# Patient Record
Sex: Female | Born: 1935 | Race: Black or African American | Hispanic: No | Marital: Married | Smoking: Never smoker
Health system: Southern US, Community
[De-identification: ages and names within clinical notes are randomized; demographics above are authoritative.]

## PROBLEM LIST (undated history)

## (undated) DIAGNOSIS — Z9289 Personal history of other medical treatment: Secondary | ICD-10-CM

## (undated) DIAGNOSIS — Z974 Presence of external hearing-aid: Secondary | ICD-10-CM

## (undated) DIAGNOSIS — E785 Hyperlipidemia, unspecified: Secondary | ICD-10-CM

## (undated) DIAGNOSIS — I10 Essential (primary) hypertension: Secondary | ICD-10-CM

## (undated) DIAGNOSIS — F32A Depression, unspecified: Secondary | ICD-10-CM

## (undated) DIAGNOSIS — G629 Polyneuropathy, unspecified: Secondary | ICD-10-CM

## (undated) DIAGNOSIS — D649 Anemia, unspecified: Secondary | ICD-10-CM

## (undated) DIAGNOSIS — Z87442 Personal history of urinary calculi: Secondary | ICD-10-CM

## (undated) DIAGNOSIS — R51 Headache: Secondary | ICD-10-CM

## (undated) DIAGNOSIS — K5900 Constipation, unspecified: Secondary | ICD-10-CM

## (undated) DIAGNOSIS — N289 Disorder of kidney and ureter, unspecified: Secondary | ICD-10-CM

## (undated) DIAGNOSIS — F329 Major depressive disorder, single episode, unspecified: Secondary | ICD-10-CM

## (undated) DIAGNOSIS — Z992 Dependence on renal dialysis: Secondary | ICD-10-CM

## (undated) DIAGNOSIS — I2510 Atherosclerotic heart disease of native coronary artery without angina pectoris: Secondary | ICD-10-CM

## (undated) DIAGNOSIS — M1990 Unspecified osteoarthritis, unspecified site: Secondary | ICD-10-CM

## (undated) DIAGNOSIS — R0602 Shortness of breath: Secondary | ICD-10-CM

## (undated) DIAGNOSIS — K219 Gastro-esophageal reflux disease without esophagitis: Secondary | ICD-10-CM

## (undated) DIAGNOSIS — T82898A Other specified complication of vascular prosthetic devices, implants and grafts, initial encounter: Secondary | ICD-10-CM

## (undated) DIAGNOSIS — J449 Chronic obstructive pulmonary disease, unspecified: Secondary | ICD-10-CM

## (undated) DIAGNOSIS — I209 Angina pectoris, unspecified: Secondary | ICD-10-CM

## (undated) DIAGNOSIS — J45909 Unspecified asthma, uncomplicated: Secondary | ICD-10-CM

## (undated) DIAGNOSIS — Z973 Presence of spectacles and contact lenses: Secondary | ICD-10-CM

## (undated) DIAGNOSIS — E213 Hyperparathyroidism, unspecified: Secondary | ICD-10-CM

## (undated) DIAGNOSIS — N189 Chronic kidney disease, unspecified: Secondary | ICD-10-CM

## (undated) DIAGNOSIS — J189 Pneumonia, unspecified organism: Secondary | ICD-10-CM

## (undated) HISTORY — PX: APPENDECTOMY: SHX54

## (undated) HISTORY — DX: Major depressive disorder, single episode, unspecified: F32.9

## (undated) HISTORY — DX: Chronic obstructive pulmonary disease, unspecified: J44.9

## (undated) HISTORY — DX: Anemia, unspecified: D64.9

## (undated) HISTORY — DX: Polyneuropathy, unspecified: G62.9

## (undated) HISTORY — DX: Essential (primary) hypertension: I10

## (undated) HISTORY — DX: Depression, unspecified: F32.A

## (undated) HISTORY — DX: Chronic kidney disease, unspecified: N18.9

## (undated) HISTORY — PX: EYE SURGERY: SHX253

## (undated) HISTORY — DX: Hyperlipidemia, unspecified: E78.5

## (undated) HISTORY — DX: Atherosclerotic heart disease of native coronary artery without angina pectoris: I25.10

---

## 1974-08-31 HISTORY — PX: ABDOMINAL HYSTERECTOMY: SHX81

## 2011-09-01 HISTORY — PX: KIDNEY STONE SURGERY: SHX686

## 2011-11-03 ENCOUNTER — Encounter: Payer: Self-pay | Admitting: Vascular Surgery

## 2011-11-05 ENCOUNTER — Ambulatory Visit: Payer: Medicare Other | Admitting: Vascular Surgery

## 2011-12-09 ENCOUNTER — Encounter: Payer: Self-pay | Admitting: Vascular Surgery

## 2011-12-10 ENCOUNTER — Ambulatory Visit (INDEPENDENT_AMBULATORY_CARE_PROVIDER_SITE_OTHER): Payer: Medicare Other | Admitting: Vascular Surgery

## 2011-12-10 ENCOUNTER — Encounter: Payer: Self-pay | Admitting: Vascular Surgery

## 2011-12-10 VITALS — BP 136/57 | HR 77 | Temp 98.3°F | Ht 63.0 in | Wt 156.7 lb

## 2011-12-10 DIAGNOSIS — Z992 Dependence on renal dialysis: Secondary | ICD-10-CM | POA: Insufficient documentation

## 2011-12-10 DIAGNOSIS — N189 Chronic kidney disease, unspecified: Secondary | ICD-10-CM

## 2011-12-10 DIAGNOSIS — N186 End stage renal disease: Secondary | ICD-10-CM

## 2011-12-10 DIAGNOSIS — Z01810 Encounter for preprocedural cardiovascular examination: Secondary | ICD-10-CM

## 2011-12-10 NOTE — Progress Notes (Signed)
[facility] [place] HISTORY AND PHYSICAL   History of Present Illness:  Patient is a 78 y.o. year old female who presents for placement of a permanent hemodialysis access. The patient is right handed .  The patient is not currently on hemodialysis.  The cause of renal failure is thought to be secondary to nephrolithiasis.  Other chronic medical problems include diabetes, hyperlipidemia, neuropathy, COPD, hypertension, anemia, coronary artery disease. These are all currently stable and followed by Dr. Feliciana Rossetti.  Her nephrologist is Dr. Elvis Coil.  Past Medical History  Diagnosis Date  . COPD (chronic obstructive pulmonary disease)     emphysema  . Neuropathy   . Hyperlipidemia   . Diabetes mellitus   . Chronic kidney disease   . Hypertension   . Depression   . Anemia   . CAD (coronary artery disease)   . Stroke     Past Surgical History  Procedure Date  . Abdominal hysterectomy 1976  . Kidney stone surgery 2013     Social History History  Substance Use Topics  . Smoking status: Never Smoker   . Smokeless tobacco: Not on file  . Alcohol Use: Not on file    Family History Family History  Problem Relation Age of Onset  . Diabetes Mother   . Cancer Father   . Deep vein thrombosis Brother   . Hypertension Brother     Allergies  No Known Allergies   Current Outpatient Prescriptions  Medication Sig Dispense Refill  . albuterol (PROVENTIL HFA;VENTOLIN HFA) 108 (90 BASE) MCG/ACT inhaler Inhale 2 puffs into the lungs every 6 (six) hours as needed.      Marland Kitchen aspirin EC 81 MG tablet Take 81 mg by mouth daily.      Marland Kitchen azelastine (ASTELIN) 137 MCG/SPRAY nasal spray Place 1 spray into the nose 2 (two) times daily. Use in each nostril as directed      . cloNIDine (CATAPRES) 0.2 MG tablet Take 0.2 mg by mouth 4 (four) times daily.      Marland Kitchen epoetin alfa (EPOGEN,PROCRIT) 16109 UNIT/ML injection Inject 10,000 Units into the skin.       Marland Kitchen esomeprazole (NEXIUM)  40 MG capsule Take 40 mg by mouth 2 (two) times daily.      . ferrous gluconate (FERGON) 325 MG tablet Take 325 mg by mouth 3 (three) times daily with meals.      . ferrous sulfate 325 (65 FE) MG tablet Take 325 mg by mouth 3 (three) times daily.      . furosemide (LASIX) 40 MG tablet Take 40 mg by mouth daily.      Marland Kitchen gabapentin (NEURONTIN) 300 MG capsule Take 300 mg by mouth 4 (four) times daily.      Marland Kitchen glipiZIDE (GLUCOTROL) 5 MG tablet Take 5 mg by mouth 2 (two) times daily before a meal.      . hydrALAZINE (APRESOLINE) 100 MG tablet Take 100 mg by mouth 2 (two) times daily.      . minoxidil (LONITEN) 2.5 MG tablet Take 2.5 mg by mouth daily.      . polyethylene glycol (MIRALAX / GLYCOLAX) packet Take 17 g by mouth daily.      . simvastatin (ZOCOR) 10 MG tablet Take 5 mg by mouth at bedtime.       . sitaGLIPtin (JANUVIA) 50 MG tablet Take 25 mg by mouth daily.       . vitamin C (ASCORBIC ACID) 500 MG tablet Take 500 mg  by mouth 3 (three) times daily.      . hydrochlorothiazide (HYDRODIURIL) 25 MG tablet Take 25 mg by mouth daily.      . ISOSORBIDE PO Take 160 mg by mouth daily.      Marland Kitchen NIFEdipine (ADALAT CC) 90 MG 24 hr tablet Take 90 mg by mouth daily.      . nitroGLYCERIN (NITROSTAT) 0.4 MG SL tablet Place 0.4 mg under the tongue every 5 (five) minutes as needed.      . pioglitazone (ACTOS) 15 MG tablet Take 15 mg by mouth daily.      Marland Kitchen tiZANidine (ZANAFLEX) 4 MG capsule Take 4 mg by mouth as needed.      . traMADol (ULTRAM) 50 MG tablet Take 50 mg by mouth every 6 (six) hours as needed.      . Wheat Dextrin (BENEFIBER PO) Take by mouth.        ROS:   General:  No weight loss, Fever, chills  HEENT: No recent headaches, no nasal bleeding, no visual changes, no sore throat, she is hard of hearing  Neurologic: No dizziness, blackouts, seizures. No recent symptoms of stroke or mini- stroke. No recent episodes of slurred speech, or temporary blindness.  Cardiac: No recent episodes of  chest pain/pressure, no shortness of breath at rest.  + shortness of breath with exertion.  Denies history of atrial fibrillation or irregular heartbeat  Vascular: No history of rest pain in feet.  No history of claudication.  No history of non-healing ulcer, No history of DVT   Pulmonary: No home oxygen, no productive cough, no hemoptysis  Musculoskeletal:  [x ] Arthritis, [ ]  Low back pain,  [ ]  Joint pain  Hematologic:No history of hypercoagulable state.  No history of easy bleeding.  No history of anemia  Gastrointestinal: No hematochezia or melena,  No gastroesophageal reflux, no trouble swallowing  Urinary: [x ] chronic Kidney disease, [ ]  on HD - [ ]  MWF or [ ]  TTHS, [ ]  Burning with urination, [ ]  Frequent urination, [ ]  Difficulty urinating;   Skin: No rashes  Psychological: No history of anxiety,  No history of depression   Physical Examination  Filed Vitals:   12/10/11 1621  BP: 136/57  Pulse: 77  Temp: 98.3 F (36.8 C)  TempSrc: Oral  Height: 5\' 3"  (1.6 m)  Weight: 156 lb 11.2 oz (71.079 kg)  SpO2: 100%    Body mass index is 27.76 kg/(m^2).  General:  Alert and oriented, no acute distress HEENT: Normal Neck: No bruit or JVD Pulmonary: Clear to auscultation bilaterally Cardiac: Regular Rate and Rhythm without murmur Gastrointestinal: Soft, non-tender, non-distended, no mass, no scars Skin: No rash Extremity Pulses:  2+ radial, brachial pulses bilaterally, on placement of a tourniquet on the left forearm the cephalic vein is not easily visible Musculoskeletal: No deformity or edema  Neurologic: Upper and lower extremity motor 5/5 and symmetric  DATA: She had a vein mapping ultrasound today which I reviewed and interpreted. This shows the left cephalic vein is greater than 3 mm in the forearm but smaller in the upper arm the left basilic vein is 4-6 mm in the upper arm the right basilic vein is 46 mm in the upper arm the right cephalic vein is not  visualized   ASSESSMENT: Patient needs long-term hemodialysis access. She has a reasonable vein in the left forearm for possible radiocephalic fistula. However on physical exam the vein was fairly marginal.  I believe the best option  would be to attempt placing a left radiocephalic AV fistula. But at the time of the operation the vein is of poor quality we would do a first stage basilic vein transposition fistula. I discussed with the patient and her daughter the operative plan as well as the fact that if we do a basilic vein transposition it would be a two-stage procedure. Also discussed with them the risks benefits and possible complications of the procedure including but limited to bleeding infection non-maturation of the fistula ischemic steal to understand agree to proceed.   PLAN:  Left radiocephalic fistula versus basilic vein transposition on 12/22/2011  Fabienne Bruns, MD [facility] [place] Office: [phone] Pager: [phone]

## 2011-12-16 ENCOUNTER — Encounter (HOSPITAL_COMMUNITY): Payer: Self-pay | Admitting: Pharmacy Technician

## 2011-12-18 ENCOUNTER — Other Ambulatory Visit: Payer: Self-pay

## 2011-12-18 NOTE — Procedures (Unsigned)
CEPHALIC VEIN MAPPING  INDICATION:  Chronic kidney disease unspecified.  HISTORY: CAD, chronic kidney disease, non-smoker.  EXAM:  The right cephalic vein is compressible.  Diameter measurements range from 0.09 to 0.25 cm.  The right basilic vein is compressible.  Diameter measurements range from 0.42 to 0.62 cm.  The left cephalic vein is compressible.  Diameter measurements range from 0.21 to 0.38 cm.  The left basilic vein is compressible.  Diameter measurements range from 0.36 to 0.50.  See attached worksheet for all measurements.  IMPRESSION: 1. Patent bilateral cephalic and basilic veins with diameter     measurements as described above. 2. Technically difficult and limited visualization of the right     cephalic vein due to small caliber in the upper arm, unable to     follow to the subclavian confluence which may be suggestive of     occlusion versus small caliber.   ___________________________________________ Annette Hora Fields, MD  SH/MEDQ  D:  12/10/2011  T:  12/10/2011  Job:  [id number]

## 2011-12-21 NOTE — Pre-Procedure Instructions (Signed)
20 Annette Roach  12/21/2011   Your procedure is scheduled on:  December 28, 2011 at 1059 am.  Report to [facility] at 0730 AM.  Call this number if you have problems the morning of surgery: [phone]   Remember:   Do not eat food:After Midnight.  May have clear liquids: up to 4 Hours before arrival until 0330 am.  Clear liquids include soda, tea, black coffee, apple or grape juice, broth.  Take these medicines the morning of surgery with A SIP OF WATER: Albuterol Inhaler, Clonidine (Catapres) , Esomeprazole (Nexium), Gabapentin (Neurontin), Hydralazine( Apresoline), and Tramadol (Ultram) if needed for pain.    Do not wear jewelry, make-up or nail polish.  Do not wear lotions, powders, or perfumes. You may wear deodorant.  Do not shave 48 hours prior to surgery.  Do not bring valuables to the hospital.  Contacts, dentures or bridgework may not be worn into surgery.  Leave suitcase in the car. After surgery it may be brought to your room.  For patients admitted to the hospital, checkout time is 11:00 AM the day of discharge.   Patients discharged the day of surgery will not be allowed to drive home.  Name and phone number of your driver:   Special Instructions: CHG Shower Use Special Wash: 1/2 bottle night before surgery and 1/2 bottle morning of surgery.   Please read over the following fact sheets that you were given: Pain Booklet, Coughing and Deep Breathing, MRSA Information and Surgical Site Infection Prevention

## 2011-12-22 ENCOUNTER — Telehealth: Payer: Self-pay | Admitting: *Deleted

## 2011-12-22 ENCOUNTER — Inpatient Hospital Stay (HOSPITAL_COMMUNITY): Admission: RE | Admit: 2011-12-22 | Discharge: 2011-12-22 | Payer: Medicare Other | Source: Ambulatory Visit

## 2011-12-22 NOTE — Telephone Encounter (Signed)
Cancel surgery on 12/28/11 because Annette Roach undecided and wants to wait. She said they would call back to reschedule

## 2011-12-23 ENCOUNTER — Other Ambulatory Visit (HOSPITAL_COMMUNITY): Payer: Medicare Other

## 2011-12-28 ENCOUNTER — Encounter (HOSPITAL_COMMUNITY): Admission: RE | Payer: Self-pay | Source: Ambulatory Visit

## 2011-12-28 ENCOUNTER — Ambulatory Visit (HOSPITAL_COMMUNITY): Admission: RE | Admit: 2011-12-28 | Payer: Medicare Other | Source: Ambulatory Visit | Admitting: Vascular Surgery

## 2011-12-28 SURGERY — ARTERIOVENOUS (AV) FISTULA CREATION
Anesthesia: General | Site: Arm Lower | Laterality: Left

## 2012-01-07 ENCOUNTER — Other Ambulatory Visit: Payer: Self-pay | Admitting: *Deleted

## 2012-01-08 ENCOUNTER — Encounter (HOSPITAL_COMMUNITY): Payer: Self-pay | Admitting: Pharmacy Technician

## 2012-01-19 ENCOUNTER — Encounter (HOSPITAL_COMMUNITY): Payer: Self-pay | Admitting: *Deleted

## 2012-01-19 MED ORDER — CEFAZOLIN SODIUM 1-5 GM-% IV SOLN
1.0000 g | Freq: Once | INTRAVENOUS | Status: AC
  Administered 2012-01-20: 1 g via INTRAVENOUS
  Filled 2012-01-19: qty 50

## 2012-01-20 ENCOUNTER — Encounter (HOSPITAL_COMMUNITY): Payer: Self-pay | Admitting: Surgery

## 2012-01-20 ENCOUNTER — Encounter (HOSPITAL_COMMUNITY): Payer: Self-pay | Admitting: Certified Registered"

## 2012-01-20 ENCOUNTER — Encounter (HOSPITAL_COMMUNITY)
Admission: RE | Disposition: A | Discharge status: Home / Self Care | Payer: Self-pay | Source: Ambulatory Visit | Attending: Vascular Surgery

## 2012-01-20 ENCOUNTER — Ambulatory Visit (HOSPITAL_COMMUNITY)
Admission: RE | Admit: 2012-01-20 | Discharge: 2012-01-20 | Disposition: A | Discharge status: Home / Self Care | Payer: Medicare Other | Source: Ambulatory Visit | Attending: Vascular Surgery | Admitting: Vascular Surgery

## 2012-01-20 ENCOUNTER — Ambulatory Visit (HOSPITAL_COMMUNITY): Payer: Medicare Other | Admitting: Certified Registered"

## 2012-01-20 ENCOUNTER — Telehealth: Payer: Self-pay | Admitting: Vascular Surgery

## 2012-01-20 ENCOUNTER — Ambulatory Visit (HOSPITAL_COMMUNITY): Payer: Medicare Other

## 2012-01-20 DIAGNOSIS — F3289 Other specified depressive episodes: Secondary | ICD-10-CM | POA: Insufficient documentation

## 2012-01-20 DIAGNOSIS — E119 Type 2 diabetes mellitus without complications: Secondary | ICD-10-CM | POA: Insufficient documentation

## 2012-01-20 DIAGNOSIS — F411 Generalized anxiety disorder: Secondary | ICD-10-CM | POA: Insufficient documentation

## 2012-01-20 DIAGNOSIS — N186 End stage renal disease: Secondary | ICD-10-CM | POA: Insufficient documentation

## 2012-01-20 DIAGNOSIS — I2510 Atherosclerotic heart disease of native coronary artery without angina pectoris: Secondary | ICD-10-CM | POA: Insufficient documentation

## 2012-01-20 DIAGNOSIS — I120 Hypertensive chronic kidney disease with stage 5 chronic kidney disease or end stage renal disease: Secondary | ICD-10-CM | POA: Insufficient documentation

## 2012-01-20 DIAGNOSIS — K219 Gastro-esophageal reflux disease without esophagitis: Secondary | ICD-10-CM | POA: Insufficient documentation

## 2012-01-20 DIAGNOSIS — F329 Major depressive disorder, single episode, unspecified: Secondary | ICD-10-CM | POA: Insufficient documentation

## 2012-01-20 HISTORY — DX: Shortness of breath: R06.02

## 2012-01-20 HISTORY — PX: AV FISTULA PLACEMENT: SHX1204

## 2012-01-20 HISTORY — DX: Gastro-esophageal reflux disease without esophagitis: K21.9

## 2012-01-20 LAB — SURGICAL PCR SCREEN: MRSA, PCR: NEGATIVE

## 2012-01-20 LAB — POCT I-STAT 4, (NA,K, GLUC, HGB,HCT)
Hemoglobin: 11.9 g/dL — ABNORMAL LOW (ref 12.0–15.0)
Potassium: 5.1 mEq/L (ref 3.5–5.1)

## 2012-01-20 SURGERY — ARTERIOVENOUS (AV) FISTULA CREATION
Anesthesia: General | Site: Arm Upper | Laterality: Left | Wound class: Clean

## 2012-01-20 MED ORDER — FENTANYL CITRATE 0.05 MG/ML IJ SOLN
INTRAMUSCULAR | Status: DC | PRN
  Administered 2012-01-20 (×3): 25 ug via INTRAVENOUS

## 2012-01-20 MED ORDER — MUPIROCIN 2 % EX OINT
TOPICAL_OINTMENT | CUTANEOUS | Status: AC
  Administered 2012-01-20: 1 via NASAL
  Filled 2012-01-20: qty 22

## 2012-01-20 MED ORDER — SODIUM CHLORIDE 0.9 % IV SOLN
INTRAVENOUS | Status: DC | PRN
  Administered 2012-01-20: 08:00:00 via INTRAVENOUS

## 2012-01-20 MED ORDER — ONDANSETRON HCL 4 MG/2ML IJ SOLN
INTRAMUSCULAR | Status: DC | PRN
  Administered 2012-01-20: 4 mg via INTRAVENOUS

## 2012-01-20 MED ORDER — LIDOCAINE HCL (CARDIAC) 20 MG/ML IV SOLN
INTRAVENOUS | Status: DC | PRN
  Administered 2012-01-20: 60 mg via INTRAVENOUS

## 2012-01-20 MED ORDER — OXYCODONE HCL 5 MG PO TABS: 5.0000 mg | ORAL_TABLET | ORAL | Status: AC | PRN

## 2012-01-20 MED ORDER — HYDROMORPHONE HCL PF 1 MG/ML IJ SOLN
0.2500 mg | INTRAMUSCULAR | Status: DC | PRN
  Administered 2012-01-20: 0.5 mg via INTRAVENOUS

## 2012-01-20 MED ORDER — PROPOFOL 10 MG/ML IV EMUL
INTRAVENOUS | Status: DC | PRN
  Administered 2012-01-20: 100 mg via INTRAVENOUS

## 2012-01-20 MED ORDER — DROPERIDOL 2.5 MG/ML IJ SOLN: 0.6250 mg | INTRAMUSCULAR | Status: DC | PRN

## 2012-01-20 MED ORDER — MUPIROCIN 2 % EX OINT
TOPICAL_OINTMENT | Freq: Two times a day (BID) | CUTANEOUS | Status: DC
  Filled 2012-01-20: qty 22

## 2012-01-20 MED ORDER — SODIUM CHLORIDE 0.9 % IV SOLN: INTRAVENOUS | Status: DC

## 2012-01-20 MED ORDER — 0.9 % SODIUM CHLORIDE (POUR BTL) OPTIME
TOPICAL | Status: DC | PRN
  Administered 2012-01-20: 1000 mL

## 2012-01-20 MED ORDER — IPRATROPIUM-ALBUTEROL 18-103 MCG/ACT IN AERO
INHALATION_SPRAY | RESPIRATORY_TRACT | Status: DC | PRN
  Administered 2012-01-20: 2 via RESPIRATORY_TRACT

## 2012-01-20 MED ORDER — HEPARIN SODIUM (PORCINE) 1000 UNIT/ML IJ SOLN
INTRAMUSCULAR | Status: DC | PRN
  Administered 2012-01-20: 5000 [IU] via INTRAVENOUS

## 2012-01-20 MED ORDER — ESMOLOL HCL 10 MG/ML IV SOLN
INTRAVENOUS | Status: DC | PRN
  Administered 2012-01-20: 5 mg via INTRAVENOUS

## 2012-01-20 MED ORDER — SODIUM CHLORIDE 0.9 % IR SOLN
Status: DC | PRN
  Administered 2012-01-20: 09:00:00

## 2012-01-20 SURGICAL SUPPLY — 45 items
CANISTER SUCTION 2500CC (MISCELLANEOUS) ×3 IMPLANT
CLIP TI MEDIUM 6 (CLIP) ×3 IMPLANT
CLIP TI WIDE RED SMALL 6 (CLIP) ×6 IMPLANT
CLOTH BEACON ORANGE TIMEOUT ST (SAFETY) ×3 IMPLANT
COVER PROBE W GEL 5X96 (DRAPES) IMPLANT
COVER SURGICAL LIGHT HANDLE (MISCELLANEOUS) ×6 IMPLANT
DECANTER SPIKE VIAL GLASS SM (MISCELLANEOUS) ×3 IMPLANT
DERMABOND ADVANCED (GAUZE/BANDAGES/DRESSINGS) ×1
DERMABOND ADVANCED .7 DNX12 (GAUZE/BANDAGES/DRESSINGS) ×2 IMPLANT
DRAIN PENROSE 1/4X12 LTX STRL (WOUND CARE) ×3 IMPLANT
ELECT REM PT RETURN 9FT ADLT (ELECTROSURGICAL) ×3
ELECTRODE REM PT RTRN 9FT ADLT (ELECTROSURGICAL) ×2 IMPLANT
GAUZE SPONGE 2X2 8PLY STRL LF (GAUZE/BANDAGES/DRESSINGS) ×2 IMPLANT
GEL ULTRASOUND 20GR AQUASONIC (MISCELLANEOUS) IMPLANT
GLOVE BIO SURGEON STRL SZ7.5 (GLOVE) ×3 IMPLANT
GLOVE BIO SURGEON STRL SZ8 (GLOVE) ×3 IMPLANT
GLOVE BIOGEL PI IND STRL 6.5 (GLOVE) ×2 IMPLANT
GLOVE BIOGEL PI IND STRL 7.0 (GLOVE) ×2 IMPLANT
GLOVE BIOGEL PI IND STRL 7.5 (GLOVE) ×2 IMPLANT
GLOVE BIOGEL PI INDICATOR 6.5 (GLOVE) ×1
GLOVE BIOGEL PI INDICATOR 7.0 (GLOVE) ×1
GLOVE BIOGEL PI INDICATOR 7.5 (GLOVE) ×1
GLOVE SS BIOGEL STRL SZ 7 (GLOVE) ×2 IMPLANT
GLOVE SUPERSENSE BIOGEL SZ 7 (GLOVE) ×1
GLOVE SURG SS PI 7.5 STRL IVOR (GLOVE) ×3 IMPLANT
GOWN PREVENTION PLUS XLARGE (GOWN DISPOSABLE) IMPLANT
GOWN STRL NON-REIN LRG LVL3 (GOWN DISPOSABLE) IMPLANT
KIT BASIN OR (CUSTOM PROCEDURE TRAY) ×3 IMPLANT
KIT ROOM TURNOVER OR (KITS) ×3 IMPLANT
LOOP VESSEL MINI RED (MISCELLANEOUS) IMPLANT
NS IRRIG 1000ML POUR BTL (IV SOLUTION) ×3 IMPLANT
PACK CV ACCESS (CUSTOM PROCEDURE TRAY) ×3 IMPLANT
PAD ARMBOARD 7.5X6 YLW CONV (MISCELLANEOUS) ×6 IMPLANT
SPONGE GAUZE 2X2 STER 10/PKG (GAUZE/BANDAGES/DRESSINGS) ×1
SPONGE SURGIFOAM ABS GEL 100 (HEMOSTASIS) IMPLANT
SUT PROLENE 6 0 CC (SUTURE) ×3 IMPLANT
SUT PROLENE 7 0 BV 1 (SUTURE) ×3 IMPLANT
SUT VIC AB 3-0 SH 27 (SUTURE) ×1
SUT VIC AB 3-0 SH 27X BRD (SUTURE) ×2 IMPLANT
SUT VICRYL 4-0 PS2 18IN ABS (SUTURE) ×3 IMPLANT
TAPE CLOTH SURG 4X10 WHT LF (GAUZE/BANDAGES/DRESSINGS) ×3 IMPLANT
TOWEL OR 17X24 6PK STRL BLUE (TOWEL DISPOSABLE) ×3 IMPLANT
TOWEL OR 17X26 10 PK STRL BLUE (TOWEL DISPOSABLE) ×3 IMPLANT
UNDERPAD 30X30 INCONTINENT (UNDERPADS AND DIAPERS) ×3 IMPLANT
WATER STERILE IRR 1000ML POUR (IV SOLUTION) ×3 IMPLANT

## 2012-01-20 NOTE — Progress Notes (Signed)
Dr. Krista Blue notified of patient EKG and he stated he will evaluate pt in the holding area.

## 2012-01-20 NOTE — Transfer of Care (Signed)
Immediate Anesthesia Transfer of Care Note  Patient: Annette Roach  Procedure(s) Performed: Procedure(s) (LRB): ARTERIOVENOUS (AV) FISTULA CREATION (Left)  Patient Location: PACU  Anesthesia Type: General  Level of Consciousness: sedated  Airway & Oxygen Therapy: Patient Spontanous Breathing  Post-op Assessment: Report given to PACU RN  Post vital signs: stable  Complications: No apparent anesthesia complications

## 2012-01-20 NOTE — Op Note (Signed)
Procedure: Left Radial Cephalic AV fistula   Preop: ESRD   Postop: ESRD   Anesthesia: General  Assistant:Maureen Collins, PA-c   Findings: 2.5 mm cephalic vein   2.5 mm radial artery   Procedure Details:  The left upper extremity was prepped and draped in usual sterile fashion. A longitudinal skin incision was made midway between the cephalic vein and radial artery at the distal left forearm. The incision was carried into the subcutaneous tissues down to level cephalic vein. The vein had some spasm but was overall reasonable quality 2.95mm diameter. The vein was dissected free circumferentially and small side branches ligated and divided between silk ties. This was gently distended with heparinized saline, spatulated, and marked for orientation. Next the radial artery was dissected free in the medial portion incision. The artery was 2.5 mm in diameter but had a reasonable pulse. The vessel loops were placed proximal and distal to the planned site of arteriotomy. The patient was given 5000 units of intravenous heparin. After appropriate circulation time, the vessel loops were used to control the artery. A longitudinal opening was made in the left radial artery. The vein was controlled proximally with a fine bulldog clamp. The vein was then swung over to the artery and sewn end of vein to side of artery using a running 7-0 Prolene suture. Just prior to completion, the anastomosis was fore bled back bled and thoroughly flushed. The anastomosis was secured, vessel loops released, and there was a palpable thrill in the fistula immediately. After hemostasis was obtained, the subcutaneous tissues were reapproximated using a running 3-0 Vicryl suture. The skin was then closed with a 4 Vicryl subcuticular stitch. Dermabond was applied to the skin incision. The patient tolerated the procedure well and there were no complications. Instrument sponge and needle count were correct at the end of the case. The patient  was taken to PACU in stable condition.   Fabienne Bruns, MD  [facility] [place]  Office: [phone]  Pager: [phone]

## 2012-01-20 NOTE — Anesthesia Postprocedure Evaluation (Signed)
Anesthesia Post Note  Patient: Annette Roach  Procedure(s) Performed: Procedure(s) (LRB): ARTERIOVENOUS (AV) FISTULA CREATION (Left)  Anesthesia type: general  Patient location: PACU  Post pain: Pain level controlled  Post assessment: Patient's Cardiovascular Status Stable  Last Vitals:  Filed Vitals:   01/20/12 1140  BP: 139/66  Pulse: 79  Temp: 36.4 C  Resp: 14    Post vital signs: Reviewed and stable  Level of consciousness: sedated  Complications: No apparent anesthesia complications

## 2012-01-20 NOTE — Telephone Encounter (Signed)
Message copied by Fredrich Birks on Wed Jan 20, 2012 11:55 AM ------      Message from: Fabienne Bruns E      Created: Wed Jan 20, 2012  9:57 AM       Left radial cephalic AVF      Collins asst      Needs follow up one month            Leonette Most

## 2012-01-20 NOTE — Anesthesia Preprocedure Evaluation (Addendum)
Anesthesia Evaluation  Patient identified by MRN, date of birth, ID band Patient awake    Reviewed: Allergy & Precautions, H&P , NPO status , Patient's Chart, lab work & pertinent test results, reviewed documented beta blocker date and time   History of Anesthesia Complications Negative for: history of anesthetic complications  Airway Mallampati: I TM Distance: <3 FB Neck ROM: Full    Dental  (+) Loose and Dental Advisory Given,    Pulmonary shortness of breath and with exertion, COPD COPD inhaler,  breath sounds clear to auscultation        Cardiovascular hypertension, Pt. on medications + CAD Rhythm:Regular Rate:Normal     Neuro/Psych Anxiety Depression  Neuromuscular disease    GI/Hepatic Neg liver ROS, GERD-  Medicated,  Endo/Other  Diabetes mellitus-, Well Controlled, Type 2, Oral Hypoglycemic Agents  Renal/GU Renal disease     Musculoskeletal   Abdominal (+)  Abdomen: soft.    Peds  Hematology   Anesthesia Other Findings   Reproductive/Obstetrics                       Anesthesia Physical Anesthesia Plan  ASA: III  Anesthesia Plan: General   Post-op Pain Management:    Induction: Intravenous  Airway Management Planned: LMA  Additional Equipment:   Intra-op Plan:   Post-operative Plan: Extubation in OR  Informed Consent: I have reviewed the patients History and Physical, chart, labs and discussed the procedure including the risks, benefits and alternatives for the proposed anesthesia with the patient or authorized representative who has indicated his/her understanding and acceptance.   Dental advisory given  Plan Discussed with: CRNA, Anesthesiologist and Surgeon  Anesthesia Plan Comments:         Anesthesia Quick Evaluation

## 2012-01-20 NOTE — Telephone Encounter (Signed)
Spoke with pts daughter to notify of follow up from surgery on 02/25/12 @ 9:45am with [facility], sent letter also, dpm

## 2012-01-20 NOTE — Preoperative (Signed)
Beta Blockers   Reason not to administer Beta Blockers:Not Applicable 

## 2012-01-20 NOTE — H&P (Signed)
[facility]   HISTORY AND PHYSICAL  History of Present Illness: Patient is a 78 y.o. year old female who presents for placement of a permanent hemodialysis access. The patient is right handed  . The patient is not currently on hemodialysis. The cause of renal failure is thought to be secondary to nephrolithiasis. Other chronic medical problems include diabetes, hyperlipidemia, neuropathy, COPD, hypertension, anemia, coronary artery disease. These are all currently stable and followed by Dr. Feliciana Rossetti. Her nephrologist is Dr. Elvis Coil.  Past Medical History   Diagnosis  Date   .  COPD (chronic obstructive pulmonary disease)      emphysema   .  Neuropathy    .  Hyperlipidemia    .  Diabetes mellitus    .  Chronic kidney disease    .  Hypertension    .  Depression    .  Anemia    .  CAD (coronary artery disease)    .  Stroke     Past Surgical History   Procedure  Date   .  Abdominal hysterectomy  1976   .  Kidney stone surgery  2013   Social History  History   Substance Use Topics   .  Smoking status:  Never Smoker   .  Smokeless tobacco:  Not on file   .  Alcohol Use:  Not on file   Family History  Family History   Problem  Relation  Age of Onset   .  Diabetes  Mother    .  Cancer  Father    .  Deep vein thrombosis  Brother    .  Hypertension  Brother    Allergies  No Known Allergies  Current Outpatient Prescriptions   Medication  Sig  Dispense  Refill   .  albuterol (PROVENTIL HFA;VENTOLIN HFA) 108 (90 BASE) MCG/ACT inhaler  Inhale 2 puffs into the lungs every 6 (six) hours as needed.     Marland Kitchen  aspirin EC 81 MG tablet  Take 81 mg by mouth daily.     Marland Kitchen  azelastine (ASTELIN) 137 MCG/SPRAY nasal spray  Place 1 spray into the nose 2 (two) times daily. Use in each nostril as directed     .  cloNIDine (CATAPRES) 0.2 MG tablet  Take 0.2 mg by mouth 4 (four) times daily.     Marland Kitchen  epoetin alfa (EPOGEN,PROCRIT) 38756 UNIT/ML injection  Inject 10,000 Units  into the skin.     Marland Kitchen  esomeprazole (NEXIUM) 40 MG capsule  Take 40 mg by mouth 2 (two) times daily.     .  ferrous gluconate (FERGON) 325 MG tablet  Take 325 mg by mouth 3 (three) times daily with meals.     .  ferrous sulfate 325 (65 FE) MG tablet  Take 325 mg by mouth 3 (three) times daily.     .  furosemide (LASIX) 40 MG tablet  Take 40 mg by mouth daily.     Marland Kitchen  gabapentin (NEURONTIN) 300 MG capsule  Take 300 mg by mouth 4 (four) times daily.     Marland Kitchen  glipiZIDE (GLUCOTROL) 5 MG tablet  Take 5 mg by mouth 2 (two) times daily before a meal.     .  hydrALAZINE (APRESOLINE) 100 MG tablet  Take 100 mg by mouth 2 (two) times daily.     .  minoxidil (LONITEN) 2.5 MG tablet  Take 2.5 mg by mouth daily.     Marland Kitchen  polyethylene glycol (MIRALAX / GLYCOLAX) packet  Take 17 g by mouth daily.     .  simvastatin (ZOCOR) 10 MG tablet  Take 5 mg by mouth at bedtime.     .  sitaGLIPtin (JANUVIA) 50 MG tablet  Take 25 mg by mouth daily.     .  vitamin C (ASCORBIC ACID) 500 MG tablet  Take 500 mg by mouth 3 (three) times daily.     .  hydrochlorothiazide (HYDRODIURIL) 25 MG tablet  Take 25 mg by mouth daily.     .  ISOSORBIDE PO  Take 160 mg by mouth daily.     Marland Kitchen  NIFEdipine (ADALAT CC) 90 MG 24 hr tablet  Take 90 mg by mouth daily.     .  nitroGLYCERIN (NITROSTAT) 0.4 MG SL tablet  Place 0.4 mg under the tongue every 5 (five) minutes as needed.     .  pioglitazone (ACTOS) 15 MG tablet  Take 15 mg by mouth daily.     Marland Kitchen  tiZANidine (ZANAFLEX) 4 MG capsule  Take 4 mg by mouth as needed.     .  traMADol (ULTRAM) 50 MG tablet  Take 50 mg by mouth every 6 (six) hours as needed.     .  Wheat Dextrin (BENEFIBER PO)  Take by mouth.     ROS:  General: No weight loss, Fever, chills  HEENT: No recent headaches, no nasal bleeding, no visual changes, no sore throat, she is hard of hearing  Neurologic: No dizziness, blackouts, seizures. No recent symptoms of stroke or mini- stroke. No recent episodes of slurred speech, or  temporary blindness.  Cardiac: No recent episodes of chest pain/pressure, no shortness of breath at rest. + shortness of breath with exertion. Denies history of atrial fibrillation or irregular heartbeat  Vascular: No history of rest pain in feet. No history of claudication. No history of non-healing ulcer, No history of DVT  Pulmonary: No home oxygen, no productive cough, no hemoptysis  Musculoskeletal: [x ] Arthritis, [ ]  Low back pain, [ ]  Joint pain  Hematologic:No history of hypercoagulable state. No history of easy bleeding. No history of anemia  Gastrointestinal: No hematochezia or melena, No gastroesophageal reflux, no trouble swallowing  Urinary: [x ] chronic Kidney disease, [ ]  on HD - [ ]  MWF or [ ]  TTHS, [ ]  Burning with urination, [ ]  Frequent urination, [ ]  Difficulty urinating;  Skin: No rashes  Psychological: No history of anxiety, No history of depression  Physical Examination  Filed Vitals:   01/20/12 0708  BP: 184/71  Pulse: 80  Temp: 98.5 F (36.9 C)  Resp: 18  General: Alert and oriented, no acute distress  HEENT: Normal  Neck: No bruit or JVD  Pulmonary: Clear to auscultation bilaterally  Cardiac: Regular Rate and Rhythm without murmur  Gastrointestinal: Soft, non-tender, non-distended, no mass, no scars  Skin: No rash  Extremity Pulses: 2+ radial, brachial pulses bilaterally, on placement of a tourniquet on the left forearm the cephalic vein is not easily visible  Musculoskeletal: No deformity or edema  Neurologic: Upper and lower extremity motor 5/5 and symmetric  DATA: She had a vein mapping ultrasound today which I reviewed and interpreted. This shows the left cephalic vein is greater than 3 mm in the forearm but smaller in the upper arm the left basilic vein is 4-6 mm in the upper arm the right basilic vein is 46 mm in the upper arm the right cephalic vein is not visualized  ASSESSMENT: Patient needs long-term hemodialysis access. She has a reasonable vein in  the left forearm for possible radiocephalic fistula. However on physical exam the vein was fairly marginal. I believe the best option would be to attempt placing a left radiocephalic AV fistula. But at the time of the operation the vein is of poor quality we would do a first stage basilic vein transposition fistula. I discussed with the patient and her daughter the operative plan as well as the fact that if we do a basilic vein transposition it would be a two-stage procedure. Also discussed with them the risks benefits and possible complications of the procedure including but limited to bleeding infection non-maturation of the fistula ischemic steal to understand agree to proceed.  PLAN: Left radiocephalic fistula versus basilic vein transposition  Annette Bruns, MD  [facility] [place]  Office: [phone]  Pager: [phone]

## 2012-01-20 NOTE — Anesthesia Procedure Notes (Signed)
Procedure Name: LMA Insertion Date/Time: 01/20/2012 8:48 AM Performed by: Ellin Goodie Pre-anesthesia Checklist: Patient identified, Emergency Drugs available, Suction available, Patient being monitored and Timeout performed Patient Re-evaluated:Patient Re-evaluated prior to inductionOxygen Delivery Method: Circle system utilized Preoxygenation: Pre-oxygenation with 100% oxygen Intubation Type: IV induction Ventilation: Mask ventilation without difficulty LMA: LMA inserted LMA Size: 4.0 Number of attempts: 1 Tube secured with: Tape Dental Injury: Teeth and Oropharynx as per pre-operative assessment

## 2012-01-21 ENCOUNTER — Encounter (HOSPITAL_COMMUNITY): Payer: Self-pay | Admitting: Vascular Surgery

## 2012-02-24 ENCOUNTER — Encounter: Payer: Self-pay | Admitting: Vascular Surgery

## 2012-02-25 ENCOUNTER — Ambulatory Visit (INDEPENDENT_AMBULATORY_CARE_PROVIDER_SITE_OTHER): Payer: Medicare Other | Admitting: Vascular Surgery

## 2012-02-25 ENCOUNTER — Encounter: Payer: Self-pay | Admitting: Vascular Surgery

## 2012-02-25 VITALS — BP 142/43 | HR 74 | Resp 14 | Ht 63.0 in | Wt 156.6 lb

## 2012-02-25 DIAGNOSIS — N186 End stage renal disease: Secondary | ICD-10-CM

## 2012-02-25 DIAGNOSIS — T82598A Other mechanical complication of other cardiac and vascular devices and implants, initial encounter: Secondary | ICD-10-CM

## 2012-02-25 DIAGNOSIS — Z4931 Encounter for adequacy testing for hemodialysis: Secondary | ICD-10-CM

## 2012-02-25 NOTE — Addendum Note (Signed)
Addended by: Sharee Pimple on: 02/25/2012 10:43 AM   Modules accepted: Orders

## 2012-02-25 NOTE — Progress Notes (Signed)
Patient is an 78 year old female returns today for followup after placement of a left radiocephalic AV fistula. Cystoscopy on May 22. She is currently not on hemodialysis. She has some complaints of achiness in the webspace of her first and second digit on the left hand. However she does not really describe steal-type symptoms.  Physical exam: Left upper extremity well-healed incision audible bruit palpable thrill left radiocephalic AV fistula estimated diameter 4-5 mm  Assessment: Developing left radiocephalic AV fistula  Plan: The patient was instructed to exercise the fistula today. She will followup with ultrasound and office visit with me in 6 weeks.  Fabienne Bruns, MD [facility] [place] Office: [phone] Pager: [phone]

## 2012-04-27 ENCOUNTER — Encounter: Payer: Self-pay | Admitting: Vascular Surgery

## 2012-04-28 ENCOUNTER — Ambulatory Visit (INDEPENDENT_AMBULATORY_CARE_PROVIDER_SITE_OTHER): Payer: Medicare Other | Admitting: Vascular Surgery

## 2012-04-28 ENCOUNTER — Encounter: Payer: Self-pay | Admitting: Vascular Surgery

## 2012-04-28 ENCOUNTER — Encounter (INDEPENDENT_AMBULATORY_CARE_PROVIDER_SITE_OTHER): Payer: Medicare Other | Admitting: *Deleted

## 2012-04-28 VITALS — BP 136/46 | HR 52 | Resp 16 | Ht 63.0 in | Wt 163.0 lb

## 2012-04-28 DIAGNOSIS — T82898A Other specified complication of vascular prosthetic devices, implants and grafts, initial encounter: Secondary | ICD-10-CM

## 2012-04-28 DIAGNOSIS — T82598A Other mechanical complication of other cardiac and vascular devices and implants, initial encounter: Secondary | ICD-10-CM

## 2012-04-28 DIAGNOSIS — N186 End stage renal disease: Secondary | ICD-10-CM

## 2012-04-28 DIAGNOSIS — Z4931 Encounter for adequacy testing for hemodialysis: Secondary | ICD-10-CM

## 2012-04-28 NOTE — Progress Notes (Signed)
Patient is an 78 year old female returns today status post left radius cephalic AV fistula placed in May of 2013. She has some occasional pain in the webspace of the first and second digits left hand. She otherwise has no symptoms of steal.  She is currently not on dialysis.  Blood pressure 136/46, pulse 52, resp. rate 16, height 5\' 3"  (1.6 m), weight 163 lb (73.936 kg), SpO2 96.00%.  Left upper extremity: Palpable thrill audible bruit left radiocephalic AV fistula with well-healed incision  Data: Patient had duplex ultrasound of her fistula today. Fistula is widely patent. There is some area of narrowing in the basilic vein system after the fistula emptied at the antecubital region. However there is good runoff via a collateral vessels. The fistula diameter was approximate 6 mm. It is less than 3 mm from the skin throughout its course.  Assessment: Mature fistula left arm Plan:  Fistula is okay for cannulation whenever necessary.  If there is a problem with decreased flow we could consider doing a fistulogram however the fistula is functioning well I do not believe she needs further followup  Fabienne Bruns, MD [facility] [place] Office: [phone] Pager: [phone]

## 2012-05-24 ENCOUNTER — Other Ambulatory Visit (HOSPITAL_COMMUNITY): Payer: Self-pay | Admitting: Nephrology

## 2012-05-24 ENCOUNTER — Other Ambulatory Visit: Payer: Self-pay | Admitting: Radiology

## 2012-05-24 DIAGNOSIS — Z992 Dependence on renal dialysis: Secondary | ICD-10-CM

## 2012-05-25 ENCOUNTER — Ambulatory Visit (HOSPITAL_COMMUNITY)
Admission: RE | Admit: 2012-05-25 | Discharge: 2012-05-25 | Disposition: A | Discharge status: Home / Self Care | Payer: Medicare Other | Source: Ambulatory Visit | Attending: Nephrology | Admitting: Nephrology

## 2012-05-25 ENCOUNTER — Encounter (HOSPITAL_COMMUNITY): Payer: Self-pay

## 2012-05-25 ENCOUNTER — Other Ambulatory Visit (HOSPITAL_COMMUNITY): Payer: Self-pay | Admitting: Nephrology

## 2012-05-25 DIAGNOSIS — Z992 Dependence on renal dialysis: Secondary | ICD-10-CM

## 2012-05-25 DIAGNOSIS — I120 Hypertensive chronic kidney disease with stage 5 chronic kidney disease or end stage renal disease: Secondary | ICD-10-CM | POA: Insufficient documentation

## 2012-05-25 DIAGNOSIS — N186 End stage renal disease: Secondary | ICD-10-CM | POA: Insufficient documentation

## 2012-05-25 DIAGNOSIS — E119 Type 2 diabetes mellitus without complications: Secondary | ICD-10-CM | POA: Insufficient documentation

## 2012-05-25 HISTORY — PX: OTHER SURGICAL HISTORY: SHX169

## 2012-05-25 HISTORY — DX: Hyperparathyroidism, unspecified: E21.3

## 2012-05-25 LAB — PROTIME-INR
INR: 0.99 (ref 0.00–1.49)
Prothrombin Time: 13 seconds (ref 11.6–15.2)

## 2012-05-25 LAB — BASIC METABOLIC PANEL
GFR calc Af Amer: 6 mL/min — ABNORMAL LOW (ref >90)
GFR calc non Af Amer: 5 mL/min — ABNORMAL LOW (ref >90)
Glucose, Bld: 158 mg/dL — ABNORMAL HIGH (ref 70–99)
Potassium: 3.9 mEq/L (ref 3.5–5.1)
Sodium: 143 mEq/L (ref 135–145)

## 2012-05-25 LAB — CBC
Hemoglobin: 9.4 g/dL — ABNORMAL LOW (ref 12.0–15.0)
MCH: 28.1 pg (ref 26.0–34.0)
RBC: 3.34 MIL/uL — ABNORMAL LOW (ref 3.87–5.11)

## 2012-05-25 MED ORDER — FENTANYL CITRATE 0.05 MG/ML IJ SOLN
INTRAMUSCULAR | Status: AC | PRN
  Administered 2012-05-25: 50 ug via INTRAVENOUS

## 2012-05-25 MED ORDER — MIDAZOLAM HCL 2 MG/2ML IJ SOLN
INTRAMUSCULAR | Status: AC
  Filled 2012-05-25: qty 6

## 2012-05-25 MED ORDER — HEPARIN SODIUM (PORCINE) 1000 UNIT/ML IJ SOLN
INTRAMUSCULAR | Status: AC
  Filled 2012-05-25: qty 1

## 2012-05-25 MED ORDER — FENTANYL CITRATE 0.05 MG/ML IJ SOLN
INTRAMUSCULAR | Status: AC
  Filled 2012-05-25: qty 6

## 2012-05-25 MED ORDER — LIDOCAINE HCL 1 % IJ SOLN
INTRAMUSCULAR | Status: AC
  Filled 2012-05-25: qty 20

## 2012-05-25 MED ORDER — MIDAZOLAM HCL 5 MG/5ML IJ SOLN
INTRAMUSCULAR | Status: AC | PRN
  Administered 2012-05-25: 1 mg via INTRAVENOUS

## 2012-05-25 MED ORDER — CEFAZOLIN SODIUM 1-5 GM-% IV SOLN
1.0000 g | INTRAVENOUS | Status: AC
Start: 2012-05-25 — End: 2012-05-25
  Administered 2012-05-25: 1 g via INTRAVENOUS
  Filled 2012-05-25: qty 50

## 2012-05-25 MED ORDER — HEPARIN SODIUM (PORCINE) 1000 UNIT/ML IJ SOLN
INTRAMUSCULAR | Status: AC | PRN
  Administered 2012-05-25 (×2): 1.8 mL

## 2012-05-25 MED ORDER — SODIUM CHLORIDE 0.9 % IV SOLN
INTRAVENOUS | Status: DC
  Administered 2012-05-25: 10:00:00 via INTRAVENOUS

## 2012-05-25 NOTE — H&P (Signed)
Annette Roach is an 78 y.o. female.   Chief Complaint: presents today for HD catheter HPI: AVF not functioning for HD.  Needs access for HD until new site created and matured.   Past Medical History  Diagnosis Date  . COPD (chronic obstructive pulmonary disease)     emphysema  . Neuropathy   . Hyperlipidemia   . Diabetes mellitus   . Chronic kidney disease   . Hypertension   . Depression   . Anemia   . CAD (coronary artery disease)   . Shortness of breath   . GERD (gastroesophageal reflux disease)   . Hyperparathyroidism     Past Surgical History  Procedure Date  . Abdominal hysterectomy 1976  . Kidney stone surgery 2013  . Av fistula placement 01/20/2012    Procedure: ARTERIOVENOUS (AV) FISTULA CREATION;  Surgeon: Sherren Kerns, MD;  Location: [facility] OR;  Service: Vascular;  Laterality: Left;  . Hemodialysis catheter 05/25/12    secondary to failed AVF    No complications with prior moderate sedation or anesthesia.  Family History  Problem Relation Age of Onset  . Diabetes Mother   . Cancer Father   . Deep vein thrombosis Brother   . Hypertension Brother    Social History:  reports that she has never smoked. She does not have any smokeless tobacco history on file. She reports that she does not drink alcohol or use illicit drugs.  Allergies: No Known Allergies    Medication List     As of 05/25/2012  9:54 AM    ASK your doctor about these medications         albuterol 108 (90 BASE) MCG/ACT inhaler   Commonly known as: PROVENTIL HFA;VENTOLIN HFA   Inhale 2 puffs into the lungs every 6 (six) hours as needed.      aspirin EC 81 MG tablet   Take 81 mg by mouth daily.      azelastine 137 MCG/SPRAY nasal spray   Commonly known as: ASTELIN   Place 1 spray into the nose 2 (two) times daily. Use in each nostril as directed      BENEFIBER PO   Take 1 scoop by mouth daily.      cloNIDine 0.2 MG tablet   Commonly known as: CATAPRES   Take 0.2 mg by mouth 4 (four)  times daily.      epoetin alfa 81191 UNIT/ML injection   Commonly known as: EPOGEN,PROCRIT   Inject 10,000 Units into the skin See admin instructions. Patient receives injection twice a month.      esomeprazole 40 MG capsule   Commonly known as: NEXIUM   Take 40 mg by mouth 2 (two) times daily.      ferrous gluconate 325 MG tablet   Commonly known as: FERGON   Take 325 mg by mouth 3 (three) times daily with meals.      ferrous sulfate 325 (65 FE) MG tablet   Take 325 mg by mouth 3 (three) times daily.      furosemide 40 MG tablet   Commonly known as: LASIX   Take 40 mg by mouth daily.      gabapentin 300 MG capsule   Commonly known as: NEURONTIN   Take 300 mg by mouth 4 (four) times daily.      glipiZIDE 5 MG tablet   Commonly known as: GLUCOTROL   Take 5 mg by mouth 2 (two) times daily before a meal.      hydrALAZINE  100 MG tablet   Commonly known as: APRESOLINE   Take 100 mg by mouth 2 (two) times daily.      hydrochlorothiazide 25 MG tablet   Commonly known as: HYDRODIURIL   Take 25 mg by mouth daily.      ISOSORBIDE PO   Take 160 mg by mouth daily.      minoxidil 2.5 MG tablet   Commonly known as: LONITEN   Take 2.5 mg by mouth daily.      NIFEdipine 90 MG 24 hr tablet   Commonly known as: ADALAT CC   Take 90 mg by mouth daily.      nitroGLYCERIN 0.4 MG SL tablet   Commonly known as: NITROSTAT   Place 0.4 mg under the tongue every 5 (five) minutes as needed.      paricalcitol 1 MCG capsule   Commonly known as: ZEMPLAR   Take 1 mcg by mouth. Take on Monday, Wednesday, Friday`      pioglitazone 15 MG tablet   Commonly known as: ACTOS   Take 15 mg by mouth daily.      polyethylene glycol packet   Commonly known as: MIRALAX / GLYCOLAX   Take 17 g by mouth daily.      simvastatin 10 MG tablet   Commonly known as: ZOCOR   Take 5 mg by mouth at bedtime.      sitaGLIPtin 50 MG tablet   Commonly known as: JANUVIA   Take 25 mg by mouth daily.       sodium bicarbonate 650 MG tablet   Take 650 mg by mouth 2 (two) times daily.      tiZANidine 4 MG capsule   Commonly known as: ZANAFLEX   Take 4 mg by mouth 3 (three) times daily as needed. Muscle spasm      traMADol 50 MG tablet   Commonly known as: ULTRAM   Take 50 mg by mouth every 6 (six) hours as needed. pain      vitamin C 500 MG tablet   Commonly known as: ASCORBIC ACID   Take 500 mg by mouth 3 (three) times daily.         Results for orders placed during the hospital encounter of 05/25/12 (from the past 48 hour(s))  APTT     Status: Normal   Collection Time   05/25/12  8:04 AM      Component Value Range Comment   aPTT 32  24 - 37 seconds   BASIC METABOLIC PANEL     Status: Abnormal   Collection Time   05/25/12  8:04 AM      Component Value Range Comment   Sodium 143  135 - 145 mEq/L    Potassium 3.9  3.5 - 5.1 mEq/L    Chloride 100  96 - 112 mEq/L    CO2 29  19 - 32 mEq/L    Glucose, Bld 158 (*) 70 - 99 mg/dL    BUN 93 (*) 6 - 23 mg/dL    Creatinine, Ser 1.61 (*) 0.50 - 1.10 mg/dL    Calcium 9.5  8.4 - 09.6 mg/dL    GFR calc non Af Amer 5 (*) >90 mL/min    GFR calc Af Amer 6 (*) >90 mL/min   CBC     Status: Abnormal   Collection Time   05/25/12  8:04 AM      Component Value Range Comment   WBC 8.4  4.0 - 10.5 K/uL  RBC 3.34 (*) 3.87 - 5.11 MIL/uL    Hemoglobin 9.4 (*) 12.0 - 15.0 g/dL    HCT 09.6 (*) 04.5 - 46.0 %    MCV 85.9  78.0 - 100.0 fL    MCH 28.1  26.0 - 34.0 pg    MCHC 32.8  30.0 - 36.0 g/dL    RDW 40.9  81.1 - 91.4 %    Platelets 237  150 - 400 K/uL   PROTIME-INR     Status: Normal   Collection Time   05/25/12  8:04 AM      Component Value Range Comment   Prothrombin Time 13.0  11.6 - 15.2 seconds    INR 0.99  0.00 - 1.49     Review of Systems  Constitutional: Positive for malaise/fatigue. Negative for fever and chills.  Respiratory: Positive for shortness of breath. Negative for cough, hemoptysis and wheezing.   Cardiovascular: Positive  for leg swelling. Negative for chest pain and palpitations.  Gastrointestinal: Positive for heartburn and constipation. Negative for nausea.  Musculoskeletal: Positive for joint pain. Negative for falls.  Skin: Negative.   Neurological: Positive for tingling, sensory change and weakness. Negative for focal weakness and seizures.  Endo/Heme/Allergies: Bruises/bleeds easily.  Psychiatric/Behavioral: Positive for depression.    Blood pressure 154/50, pulse 93, temperature 97.8 F (36.6 C), temperature source Oral, resp. rate 16, height 5\' 3"  (1.6 m), weight 154 lb (69.854 kg), SpO2 93.00%. Physical Exam  Constitutional: She is oriented to person, place, and time. She appears well-developed. No distress.  HENT:  Head: Normocephalic and atraumatic.  Cardiovascular: Normal rate and regular rhythm.  Exam reveals no gallop and no friction rub.   Murmur heard. Respiratory: Effort normal and breath sounds normal. No respiratory distress. She has no wheezes. She has no rales.  GI: Bowel sounds are normal.  Musculoskeletal: Normal range of motion.  Neurological: She is alert and oriented to person, place, and time.  Skin: Skin is warm and dry.  Psychiatric: She has a normal mood and affect. Her behavior is normal.     Assessment/Plan  Procedure details for tunneled HD catheter placement discussed in detail with patient and her daughter. Benefits and potential risks including but not limited to infection, bleeding, vessel damage, malfunctioning catheter and complications with moderate sedation discussed with both their apparent understanding. All their questions answered to their satisfaction. Written consent obtained from daughter.    CAMPBELL,PAMELA D 05/25/2012, 9:52 AM

## 2012-05-25 NOTE — Procedures (Signed)
RIJV HD catheter 23 cm tip to cuff Tip RA No comp

## 2012-07-01 ENCOUNTER — Other Ambulatory Visit: Payer: Self-pay

## 2012-07-01 DIAGNOSIS — T82598A Other mechanical complication of other cardiac and vascular devices and implants, initial encounter: Secondary | ICD-10-CM

## 2012-07-07 ENCOUNTER — Ambulatory Visit: Payer: Medicare Other | Admitting: Vascular Surgery

## 2012-07-15 ENCOUNTER — Encounter: Payer: Self-pay | Admitting: Surgery

## 2012-07-18 ENCOUNTER — Ambulatory Visit (INDEPENDENT_AMBULATORY_CARE_PROVIDER_SITE_OTHER): Payer: Medicare Other | Admitting: Surgery

## 2012-07-18 ENCOUNTER — Encounter: Payer: Self-pay | Admitting: Surgery

## 2012-07-18 VITALS — BP 162/70 | HR 80 | Resp 16 | Ht 62.0 in | Wt 149.3 lb

## 2012-07-18 DIAGNOSIS — N186 End stage renal disease: Secondary | ICD-10-CM

## 2012-07-18 DIAGNOSIS — R609 Edema, unspecified: Secondary | ICD-10-CM

## 2012-07-18 NOTE — Progress Notes (Signed)
[facility]   Patient name: Annette Roach MRN: [id number] DOB: May 16, 1928 Sex: female     Chief Complaint  Patient presents with  . EDSRD    Poor maturation of Left AVF and inability to successfully cannulate. per Dr. Allena Katz.  C/O Left AVF arm hurts and bleeds 2-3 weeks ago.    HISTORY OF PRESENT ILLNESS: The patient is back today for followup of her left radiocephalic fistula which was placed by Dr. fields in several months ago. She is having difficulty with cannulation as well as bruising and swelling. She has dialysis on Tuesday Thursday Saturday.  Past Medical History  Diagnosis Date  . COPD (chronic obstructive pulmonary disease)     emphysema  . Neuropathy   . Hyperlipidemia   . Diabetes mellitus   . Chronic kidney disease   . Hypertension   . Depression   . Anemia   . CAD (coronary artery disease)   . Shortness of breath   . GERD (gastroesophageal reflux disease)   . Hyperparathyroidism     Past Surgical History  Procedure Date  . Abdominal hysterectomy 1976  . Kidney stone surgery 2013  . Av fistula placement 01/20/2012    Procedure: ARTERIOVENOUS (AV) FISTULA CREATION;  Surgeon: Sherren Kerns, MD;  Location: [facility] OR;  Service: Vascular;  Laterality: Left;  . Hemodialysis catheter 05/25/12    secondary to failed AVF     History   Social History  . Marital Status: Widowed    Spouse Name: N/A    Number of Children: N/A  . Years of Education: N/A   Occupational History  . Not on file.   Social History Main Topics  . Smoking status: Never Smoker   . Smokeless tobacco: Not on file  . Alcohol Use: No  . Drug Use: No  . Sexually Active: Not on file   Other Topics Concern  . Not on file   Social History Narrative  . No narrative on file    Family History  Problem Relation Age of Onset  . Diabetes Mother   . Cancer Father   . Deep vein thrombosis Brother   . Hypertension Brother     Allergies as of 07/18/2012  . (No  Known Allergies)    Current Outpatient Prescriptions on File Prior to Visit  Medication Sig Dispense Refill  . aspirin EC 81 MG tablet Take 81 mg by mouth daily.      Marland Kitchen azelastine (ASTELIN) 137 MCG/SPRAY nasal spray Place 1 spray into the nose 2 (two) times daily. Use in each nostril as directed      . epoetin alfa (EPOGEN,PROCRIT) 40981 UNIT/ML injection Inject 10,000 Units into the skin See admin instructions. Patient receives injection twice a month.      . esomeprazole (NEXIUM) 40 MG capsule Take 40 mg by mouth 2 (two) times daily.      . furosemide (LASIX) 40 MG tablet Take 40 mg by mouth daily.      Marland Kitchen gabapentin (NEURONTIN) 300 MG capsule Take 300 mg by mouth 4 (four) times daily.      Marland Kitchen glipiZIDE (GLUCOTROL) 5 MG tablet Take 5 mg by mouth 2 (two) times daily before a meal.      . hydrALAZINE (APRESOLINE) 100 MG tablet Take 100 mg by mouth 2 (two) times daily.      . ISOSORBIDE PO Take 160 mg by mouth daily.      . simvastatin (ZOCOR) 10 MG tablet Take 5  mg by mouth at bedtime.       . sitaGLIPtin (JANUVIA) 50 MG tablet Take 25 mg by mouth daily.       Marland Kitchen albuterol (PROVENTIL HFA;VENTOLIN HFA) 108 (90 BASE) MCG/ACT inhaler Inhale 2 puffs into the lungs every 6 (six) hours as needed.      . cloNIDine (CATAPRES) 0.2 MG tablet Take 0.2 mg by mouth 4 (four) times daily.      . ferrous gluconate (FERGON) 325 MG tablet Take 325 mg by mouth 3 (three) times daily with meals.      . ferrous sulfate 325 (65 FE) MG tablet Take 325 mg by mouth 3 (three) times daily.      . hydrochlorothiazide (HYDRODIURIL) 25 MG tablet Take 25 mg by mouth daily.      . minoxidil (LONITEN) 2.5 MG tablet Take 2.5 mg by mouth daily.      Marland Kitchen NIFEdipine (ADALAT CC) 90 MG 24 hr tablet Take 90 mg by mouth daily.      . nitroGLYCERIN (NITROSTAT) 0.4 MG SL tablet Place 0.4 mg under the tongue every 5 (five) minutes as needed.      . paricalcitol (ZEMPLAR) 1 MCG capsule Take 1 mcg by mouth. Take on Monday, Wednesday, Friday`       . pioglitazone (ACTOS) 15 MG tablet Take 15 mg by mouth daily.      . polyethylene glycol (MIRALAX / GLYCOLAX) packet Take 17 g by mouth daily.      . sodium bicarbonate 650 MG tablet Take 650 mg by mouth 2 (two) times daily.      Marland Kitchen tiZANidine (ZANAFLEX) 4 MG capsule Take 4 mg by mouth 3 (three) times daily as needed. Muscle spasm      . traMADol (ULTRAM) 50 MG tablet Take 50 mg by mouth every 6 (six) hours as needed. pain      . vitamin C (ASCORBIC ACID) 500 MG tablet Take 500 mg by mouth 3 (three) times daily.      . Wheat Dextrin (BENEFIBER PO) Take 1 scoop by mouth daily.          REVIEW OF SYSTEMS: Cardiovascular: Positive for shortness of breath, pain in legs with walking Pulmonary: Positive for asthma and wheezing Neuro: Positive for weakness in arms and legs, and dizziness Constitutional: Positive for fevers and chills All other systems are negative as documented by the patient in the encounter form.  PHYSICAL EXAMINATION:   Vital signs are BP 162/70  Pulse 80  Resp 16  Ht 5\' 2"  (1.575 m)  Wt 149 lb 4.8 oz (67.722 kg)  BMI 27.31 kg/m2  SpO2 98% General: The patient appears their stated age. HEENT:  No gross abnormalities Pulmonary:  Non labored breathing Musculoskeletal: There are no major deformities. Neurologic: No focal weakness or paresthesias are detected, Skin: There are no ulcer or rashes noted. Psychiatric: The patient has normal affect. Cardiovascular: Left radiocephalic fistula has a good thrill within it. The vein is somewhat mobile and does feel calcified. Minimal edema in the arm. Trace ecchymosis over the mid forearm.   Diagnostic Studies I have reviewed her ultrasound from several months ago for which shows the vein to be of adequate diameter, 5 mm. This does not appear to be very deep. There are branches at the antecubital crease. The main outflow into the basilic system does appear to have some stenosis.  Assessment: Poorly functioning left  radiocephalic fistula Plan: I discussed with the family, the next step is to proceed  with a fistulogram. This will evaluate her fistula to determine the underlying cause of non-maturation. If she is a candidate for percutaneous intervention at this time, I will proceed. This is been scheduled for Wednesday, November 27.  Jorge Ny, M.D. [facility] [place] Office: [phone] Pager:  [phone]

## 2012-07-19 ENCOUNTER — Other Ambulatory Visit: Payer: Self-pay | Admitting: *Deleted

## 2012-07-20 ENCOUNTER — Encounter (HOSPITAL_COMMUNITY): Payer: Self-pay | Admitting: Pharmacy Technician

## 2012-07-22 ENCOUNTER — Other Ambulatory Visit: Payer: Self-pay

## 2012-07-27 ENCOUNTER — Encounter (HOSPITAL_COMMUNITY)
Admission: RE | Disposition: A | Discharge status: Home / Self Care | Payer: Self-pay | Source: Ambulatory Visit | Attending: Surgery

## 2012-07-27 ENCOUNTER — Ambulatory Visit (HOSPITAL_COMMUNITY)
Admission: RE | Admit: 2012-07-27 | Discharge: 2012-07-27 | Disposition: A | Discharge status: Home / Self Care | Payer: Medicare Other | Source: Ambulatory Visit | Attending: Surgery | Admitting: Surgery

## 2012-07-27 ENCOUNTER — Telehealth: Payer: Self-pay

## 2012-07-27 DIAGNOSIS — N186 End stage renal disease: Secondary | ICD-10-CM | POA: Insufficient documentation

## 2012-07-27 DIAGNOSIS — Z992 Dependence on renal dialysis: Secondary | ICD-10-CM | POA: Insufficient documentation

## 2012-07-27 DIAGNOSIS — I120 Hypertensive chronic kidney disease with stage 5 chronic kidney disease or end stage renal disease: Secondary | ICD-10-CM | POA: Insufficient documentation

## 2012-07-27 DIAGNOSIS — T82898A Other specified complication of vascular prosthetic devices, implants and grafts, initial encounter: Secondary | ICD-10-CM

## 2012-07-27 HISTORY — PX: SHUNTOGRAM: SHX5491

## 2012-07-27 LAB — POCT I-STAT, CHEM 8
BUN: 34 mg/dL — ABNORMAL HIGH (ref 6–23)
Calcium, Ion: 1.05 mmol/L — ABNORMAL LOW (ref 1.13–1.30)
Chloride: 101 mEq/L (ref 96–112)
Creatinine, Ser: 4 mg/dL — ABNORMAL HIGH (ref 0.50–1.10)
Sodium: 140 mEq/L (ref 135–145)
TCO2: 29 mmol/L (ref 0–100)

## 2012-07-27 SURGERY — ASSESSMENT, SHUNT FUNCTION, WITH CONTRAST RADIOGRAPHIC STUDY
Anesthesia: LOCAL

## 2012-07-27 MED ORDER — SODIUM CHLORIDE 0.9 % IJ SOLN: 3.0000 mL | INTRAMUSCULAR | Status: DC | PRN

## 2012-07-27 MED ORDER — HEPARIN (PORCINE) IN NACL 2-0.9 UNIT/ML-% IJ SOLN
INTRAMUSCULAR | Status: AC
  Filled 2012-07-27: qty 500

## 2012-07-27 MED ORDER — SODIUM CHLORIDE 0.9 % IV SOLN: 250.0000 mL | INTRAVENOUS | Status: DC | PRN

## 2012-07-27 MED ORDER — SODIUM CHLORIDE 0.9 % IJ SOLN: 3.0000 mL | Freq: Two times a day (BID) | INTRAMUSCULAR | Status: DC

## 2012-07-27 MED ORDER — LIDOCAINE HCL (PF) 1 % IJ SOLN
INTRAMUSCULAR | Status: AC
  Filled 2012-07-27: qty 30

## 2012-07-27 NOTE — Telephone Encounter (Signed)
Called [facility] and spoke with nurse, Joice Lofts.  Advised that, per Dr. Arbie Cookey, the left arm AVF was evaluated per Fistulogram today, and no intervention was necessary.  The dialysis center should attempt to use left arm AVF, and if problems arise with the flow/ function of the access, then pt. needs to sched. office appt. for evaluation for new permanent access.  Amber, Charity fundraiser, verb. understanding.

## 2012-07-27 NOTE — H&P (View-Only) (Signed)
[facility] [place]   Patient name: Graycen E Brodt MRN: [id number] DOB: 03/25/1928 Sex: female     Chief Complaint  Patient presents with  . EDSRD    Poor maturation of Left AVF and inability to successfully cannulate. per Dr. Patel.  C/O Left AVF arm hurts and bleeds 2-3 weeks ago.    HISTORY OF PRESENT ILLNESS: The patient is back today for followup of her left radiocephalic fistula which was placed by Dr. fields in several months ago. She is having difficulty with cannulation as well as bruising and swelling. She has dialysis on Tuesday Thursday Saturday.  Past Medical History  Diagnosis Date  . COPD (chronic obstructive pulmonary disease)     emphysema  . Neuropathy   . Hyperlipidemia   . Diabetes mellitus   . Chronic kidney disease   . Hypertension   . Depression   . Anemia   . CAD (coronary artery disease)   . Shortness of breath   . GERD (gastroesophageal reflux disease)   . Hyperparathyroidism     Past Surgical History  Procedure Date  . Abdominal hysterectomy 1976  . Kidney stone surgery 2013  . Av fistula placement 01/20/2012    Procedure: ARTERIOVENOUS (AV) FISTULA CREATION;  Surgeon: Charles E Fields, MD;  Location: [facility] OR;  Service: Vascular;  Laterality: Left;  . Hemodialysis catheter 05/25/12    secondary to failed AVF     History   Social History  . Marital Status: Widowed    Spouse Name: N/A    Number of Children: N/A  . Years of Education: N/A   Occupational History  . Not on file.   Social History Main Topics  . Smoking status: Never Smoker   . Smokeless tobacco: Not on file  . Alcohol Use: No  . Drug Use: No  . Sexually Active: Not on file   Other Topics Concern  . Not on file   Social History Narrative  . No narrative on file    Family History  Problem Relation Age of Onset  . Diabetes Mother   . Cancer Father   . Deep vein thrombosis Brother   . Hypertension Brother     Allergies as of 07/18/2012  . (No  Known Allergies)    Current Outpatient Prescriptions on File Prior to Visit  Medication Sig Dispense Refill  . aspirin EC 81 MG tablet Take 81 mg by mouth daily.      . azelastine (ASTELIN) 137 MCG/SPRAY nasal spray Place 1 spray into the nose 2 (two) times daily. Use in each nostril as directed      . epoetin alfa (EPOGEN,PROCRIT) 10000 UNIT/ML injection Inject 10,000 Units into the skin See admin instructions. Patient receives injection twice a month.      . esomeprazole (NEXIUM) 40 MG capsule Take 40 mg by mouth 2 (two) times daily.      . furosemide (LASIX) 40 MG tablet Take 40 mg by mouth daily.      . gabapentin (NEURONTIN) 300 MG capsule Take 300 mg by mouth 4 (four) times daily.      . glipiZIDE (GLUCOTROL) 5 MG tablet Take 5 mg by mouth 2 (two) times daily before a meal.      . hydrALAZINE (APRESOLINE) 100 MG tablet Take 100 mg by mouth 2 (two) times daily.      . ISOSORBIDE PO Take 160 mg by mouth daily.      . simvastatin (ZOCOR) 10 MG tablet Take 5   mg by mouth at bedtime.       . sitaGLIPtin (JANUVIA) 50 MG tablet Take 25 mg by mouth daily.       . albuterol (PROVENTIL HFA;VENTOLIN HFA) 108 (90 BASE) MCG/ACT inhaler Inhale 2 puffs into the lungs every 6 (six) hours as needed.      . cloNIDine (CATAPRES) 0.2 MG tablet Take 0.2 mg by mouth 4 (four) times daily.      . ferrous gluconate (FERGON) 325 MG tablet Take 325 mg by mouth 3 (three) times daily with meals.      . ferrous sulfate 325 (65 FE) MG tablet Take 325 mg by mouth 3 (three) times daily.      . hydrochlorothiazide (HYDRODIURIL) 25 MG tablet Take 25 mg by mouth daily.      . minoxidil (LONITEN) 2.5 MG tablet Take 2.5 mg by mouth daily.      . NIFEdipine (ADALAT CC) 90 MG 24 hr tablet Take 90 mg by mouth daily.      . nitroGLYCERIN (NITROSTAT) 0.4 MG SL tablet Place 0.4 mg under the tongue every 5 (five) minutes as needed.      . paricalcitol (ZEMPLAR) 1 MCG capsule Take 1 mcg by mouth. Take on Monday, Wednesday, Friday`       . pioglitazone (ACTOS) 15 MG tablet Take 15 mg by mouth daily.      . polyethylene glycol (MIRALAX / GLYCOLAX) packet Take 17 g by mouth daily.      . sodium bicarbonate 650 MG tablet Take 650 mg by mouth 2 (two) times daily.      . tiZANidine (ZANAFLEX) 4 MG capsule Take 4 mg by mouth 3 (three) times daily as needed. Muscle spasm      . traMADol (ULTRAM) 50 MG tablet Take 50 mg by mouth every 6 (six) hours as needed. pain      . vitamin C (ASCORBIC ACID) 500 MG tablet Take 500 mg by mouth 3 (three) times daily.      . Wheat Dextrin (BENEFIBER PO) Take 1 scoop by mouth daily.          REVIEW OF SYSTEMS: Cardiovascular: Positive for shortness of breath, pain in legs with walking Pulmonary: Positive for asthma and wheezing Neuro: Positive for weakness in arms and legs, and dizziness Constitutional: Positive for fevers and chills All other systems are negative as documented by the patient in the encounter form.  PHYSICAL EXAMINATION:   Vital signs are BP 162/70  Pulse 80  Resp 16  Ht 5' 2" (1.575 m)  Wt 149 lb 4.8 oz (67.722 kg)  BMI 27.31 kg/m2  SpO2 98% General: The patient appears their stated age. HEENT:  No gross abnormalities Pulmonary:  Non labored breathing Musculoskeletal: There are no major deformities. Neurologic: No focal weakness or paresthesias are detected, Skin: There are no ulcer or rashes noted. Psychiatric: The patient has normal affect. Cardiovascular: Left radiocephalic fistula has a good thrill within it. The vein is somewhat mobile and does feel calcified. Minimal edema in the arm. Trace ecchymosis over the mid forearm.   Diagnostic Studies I have reviewed her ultrasound from several months ago for which shows the vein to be of adequate diameter, 5 mm. This does not appear to be very deep. There are branches at the antecubital crease. The main outflow into the basilic system does appear to have some stenosis.  Assessment: Poorly functioning left  radiocephalic fistula Plan: I discussed with the family, the next step is to proceed   with a fistulogram. This will evaluate her fistula to determine the underlying cause of non-maturation. If she is a candidate for percutaneous intervention at this time, I will proceed. This is been scheduled for Wednesday, November 27.  V. Wells Brabham IV, M.D. [facility] [place] Office: [phone] Pager:  [phone]   

## 2012-07-27 NOTE — Op Note (Signed)
OPERATIVE REPORT  DATE OF SURGERY: 07/27/2012  PATIENT: Annette Roach, 78 y.o. female MRN: [id number]  DOB: 1928/07/28  PRE-OPERATIVE DIAGNOSIS: End-stage renal disease with poorly functioning left AV fistula  POST-OPERATIVE DIAGNOSIS:  Same  PROCEDURE: Left Cimino AV fistula shuntogram  SURGEON:  Gretta Began, M.D.  ANESTHESIA:  1% lidocaine local  EBL: Minimal ml     BLOOD ADMINISTERED: None  DRAINS: None  SPECIMEN:  none  COUNTS CORRECT:  YES  PLAN OF CARE: Holding area   PATIENT DISPOSITION:  PACU - hemodynamically stable  PROCEDURE DETAILS: The patient was taken to the perfused cath at the supine position where the area of the left arm was prepped and draped in the sterile fashion. A the patient has a cephalic vein fistula which is poorly maturing. A 5 French micropuncture catheter was used to access the vein near the arterial anastomosis. Hand injections through the catheter which was 5 [place] revealed no evidence of stenosis in the fistula or outflow. There was a no flow into the upper arm cephalic vein and collaterals to the deep system however the outflow. Central venogram was obtained as well which are no evidence of subclavian stenosis. Pressure was then held in may injection revealed the level of the arteriovenous anastomosis and again this was widely patent with no evidence of stenosis  Impression left Cimino AV fistula with no evidence of arterial venous stenosis and no evidence of stenosis in the vein. Outflow through the vein above the antecubital space was through the collaterals and no identifiable upper arm cephalic vein was seen   Gretta Began, M.D. 07/27/2012 12:59 PM

## 2012-07-27 NOTE — Interval H&P Note (Signed)
History and Physical Interval Note:  07/27/2012 12:39 PM  Annette Roach  has presented today for surgery, with the diagnosis of instage renal  The various methods of treatment have been discussed with the patient and family. After consideration of risks, benefits and other options for treatment, the patient has consented to  Procedure(s) (LRB) with comments: SHUNTOGRAM (N/A) as a surgical intervention .  The patient's history has been reviewed, patient examined, no change in status, stable for surgery.  I have reviewed the patient's chart and labs.  Questions were answered to the patient's satisfaction.     Farron Lafond

## 2012-08-01 LAB — GLUCOSE, CAPILLARY: Glucose-Capillary: 159 mg/dL — ABNORMAL HIGH (ref 70–99)

## 2012-08-17 ENCOUNTER — Encounter: Payer: Self-pay | Admitting: Vascular Surgery

## 2012-08-18 ENCOUNTER — Encounter: Payer: Self-pay | Admitting: Vascular Surgery

## 2012-08-18 ENCOUNTER — Other Ambulatory Visit: Payer: Self-pay

## 2012-08-18 ENCOUNTER — Ambulatory Visit (INDEPENDENT_AMBULATORY_CARE_PROVIDER_SITE_OTHER): Payer: Medicare Other | Admitting: Vascular Surgery

## 2012-08-18 VITALS — BP 126/50 | HR 81 | Ht 62.0 in | Wt 151.4 lb

## 2012-08-18 DIAGNOSIS — N186 End stage renal disease: Secondary | ICD-10-CM

## 2012-08-18 NOTE — Progress Notes (Signed)
Patient is an 78-year-old female referred for evaluation of a poorly functioning left radiocephalic AV fistula. She recently underwent a fistulogram which showed patency of the fistula but occlusion and subtotal occlusion of the venous outflow near the antecubital area. The central venous structures were patent. The patient is currently dialyzing via a catheter.  Review of systems: The patient denies shortness of breath. She denies chest pain.  Physical exam: Filed Vitals:   08/18/12 0904  BP: 126/50  Pulse: 81  Height: 5' 2" (1.575 m)  Weight: 151 lb 6.4 oz (68.675 kg)  SpO2: 97%   Extremities: Pulsatile left radiocephalic AV fistula  Neck: Dialysis catheter present  Data: The patient had a vein mapping ultrasound on 04/28/2012 which was reviewed. This does not show a reasonable vein for creation of a fistula.  Assessment: I discussed options with the patient today of possibly taking a vein from her right arm or right leg to reconstruct her fistula. The other option would be to place an AV graft. After lengthy discussions with the patient and her family she has opted for graft at this point.  Plan: Left arm AV graft which will be scheduled in the next few weeks  Charles Fields, MD [facility] Office: [phone] Pager: [phone]  

## 2012-08-26 ENCOUNTER — Encounter (HOSPITAL_COMMUNITY): Payer: Self-pay | Admitting: Pharmacy Technician

## 2012-08-31 DIAGNOSIS — T82898A Other specified complication of vascular prosthetic devices, implants and grafts, initial encounter: Secondary | ICD-10-CM

## 2012-08-31 HISTORY — DX: Other specified complication of vascular prosthetic devices, implants and grafts, initial encounter: T82.898A

## 2012-09-02 ENCOUNTER — Encounter (HOSPITAL_COMMUNITY): Payer: Self-pay

## 2012-09-04 MED ORDER — CEFAZOLIN SODIUM-DEXTROSE 2-3 GM-% IV SOLR
2.0000 g | INTRAVENOUS | Status: AC
  Administered 2012-09-05: 2 g via INTRAVENOUS
  Filled 2012-09-04 (×2): qty 50

## 2012-09-05 ENCOUNTER — Encounter (HOSPITAL_COMMUNITY): Payer: Self-pay | Admitting: Anesthesiology

## 2012-09-05 ENCOUNTER — Encounter (HOSPITAL_COMMUNITY)
Admission: RE | Disposition: A | Discharge status: Home / Self Care | Payer: Self-pay | Source: Ambulatory Visit | Attending: Vascular Surgery

## 2012-09-05 ENCOUNTER — Ambulatory Visit: Admit: 2012-09-05 | Payer: Self-pay | Admitting: Vascular Surgery

## 2012-09-05 ENCOUNTER — Encounter (HOSPITAL_COMMUNITY): Payer: Self-pay | Admitting: Certified Registered Nurse Anesthetist

## 2012-09-05 ENCOUNTER — Ambulatory Visit (HOSPITAL_COMMUNITY): Payer: Medicare Other | Admitting: Certified Registered Nurse Anesthetist

## 2012-09-05 ENCOUNTER — Ambulatory Visit (HOSPITAL_COMMUNITY): Payer: Medicare Other | Admitting: Anesthesiology

## 2012-09-05 ENCOUNTER — Ambulatory Visit (HOSPITAL_COMMUNITY)
Admission: RE | Admit: 2012-09-05 | Discharge: 2012-09-05 | Disposition: A | Discharge status: Home / Self Care | Payer: Medicare Other | Source: Ambulatory Visit | Attending: Vascular Surgery | Admitting: Vascular Surgery

## 2012-09-05 ENCOUNTER — Encounter (HOSPITAL_COMMUNITY): Payer: Self-pay | Admitting: *Deleted

## 2012-09-05 DIAGNOSIS — N186 End stage renal disease: Secondary | ICD-10-CM | POA: Insufficient documentation

## 2012-09-05 DIAGNOSIS — I120 Hypertensive chronic kidney disease with stage 5 chronic kidney disease or end stage renal disease: Secondary | ICD-10-CM | POA: Insufficient documentation

## 2012-09-05 DIAGNOSIS — K219 Gastro-esophageal reflux disease without esophagitis: Secondary | ICD-10-CM | POA: Insufficient documentation

## 2012-09-05 DIAGNOSIS — J449 Chronic obstructive pulmonary disease, unspecified: Secondary | ICD-10-CM | POA: Insufficient documentation

## 2012-09-05 DIAGNOSIS — G458 Other transient cerebral ischemic attacks and related syndromes: Secondary | ICD-10-CM

## 2012-09-05 DIAGNOSIS — E119 Type 2 diabetes mellitus without complications: Secondary | ICD-10-CM | POA: Insufficient documentation

## 2012-09-05 DIAGNOSIS — Y832 Surgical operation with anastomosis, bypass or graft as the cause of abnormal reaction of the patient, or of later complication, without mention of misadventure at the time of the procedure: Secondary | ICD-10-CM | POA: Insufficient documentation

## 2012-09-05 DIAGNOSIS — Z992 Dependence on renal dialysis: Secondary | ICD-10-CM | POA: Insufficient documentation

## 2012-09-05 DIAGNOSIS — T82898A Other specified complication of vascular prosthetic devices, implants and grafts, initial encounter: Secondary | ICD-10-CM | POA: Insufficient documentation

## 2012-09-05 DIAGNOSIS — J4489 Other specified chronic obstructive pulmonary disease: Secondary | ICD-10-CM | POA: Insufficient documentation

## 2012-09-05 DIAGNOSIS — I2510 Atherosclerotic heart disease of native coronary artery without angina pectoris: Secondary | ICD-10-CM | POA: Insufficient documentation

## 2012-09-05 HISTORY — PX: LIGATION OF ARTERIOVENOUS  FISTULA: SHX5948

## 2012-09-05 HISTORY — PX: LIGATION ARTERIOVENOUS GORTEX GRAFT: SHX5947

## 2012-09-05 HISTORY — DX: Headache: R51

## 2012-09-05 HISTORY — PX: AV FISTULA PLACEMENT: SHX1204

## 2012-09-05 HISTORY — DX: Unspecified osteoarthritis, unspecified site: M19.90

## 2012-09-05 LAB — GLUCOSE, CAPILLARY: Glucose-Capillary: 194 mg/dL — ABNORMAL HIGH (ref 70–99)

## 2012-09-05 LAB — SURGICAL PCR SCREEN
MRSA, PCR: NEGATIVE
Staphylococcus aureus: NEGATIVE

## 2012-09-05 LAB — POCT I-STAT 4, (NA,K, GLUC, HGB,HCT)
Glucose, Bld: 187 mg/dL — ABNORMAL HIGH (ref 70–99)
Sodium: 140 mEq/L (ref 135–145)

## 2012-09-05 SURGERY — LIGATION ARTERIOVENOUS GORTEX GRAFT
Anesthesia: Monitor Anesthesia Care | Site: Arm Upper | Wound class: Clean

## 2012-09-05 SURGERY — INSERTION OF ARTERIOVENOUS (AV) GORE-TEX GRAFT ARM
Anesthesia: Monitor Anesthesia Care | Site: Arm Upper | Laterality: Left | Wound class: Clean

## 2012-09-05 MED ORDER — HYDROMORPHONE HCL PF 1 MG/ML IJ SOLN: 0.2500 mg | INTRAMUSCULAR | Status: DC | PRN

## 2012-09-05 MED ORDER — MUPIROCIN 2 % EX OINT
TOPICAL_OINTMENT | Freq: Once | CUTANEOUS | Status: AC
  Administered 2012-09-05: 1 via NASAL

## 2012-09-05 MED ORDER — BUPIVACAINE HCL (PF) 0.25 % IJ SOLN
INTRAMUSCULAR | Status: AC
  Filled 2012-09-05: qty 30

## 2012-09-05 MED ORDER — BUPIVACAINE HCL (PF) 0.25 % IJ SOLN
INTRAMUSCULAR | Status: DC | PRN
  Administered 2012-09-05: 7 mL

## 2012-09-05 MED ORDER — THROMBIN 20000 UNITS EX SOLR
CUTANEOUS | Status: AC
  Filled 2012-09-05: qty 20000

## 2012-09-05 MED ORDER — PROMETHAZINE HCL 25 MG/ML IJ SOLN: 6.2500 mg | INTRAMUSCULAR | Status: DC | PRN

## 2012-09-05 MED ORDER — OXYCODONE-ACETAMINOPHEN 5-325 MG PO TABS
ORAL_TABLET | ORAL | Status: AC
  Administered 2012-09-05: 1 via ORAL
  Filled 2012-09-05: qty 1

## 2012-09-05 MED ORDER — LIDOCAINE HCL (PF) 1 % IJ SOLN
INTRAMUSCULAR | Status: AC
  Filled 2012-09-05: qty 30

## 2012-09-05 MED ORDER — 0.9 % SODIUM CHLORIDE (POUR BTL) OPTIME
TOPICAL | Status: DC | PRN
  Administered 2012-09-05: 1000 mL

## 2012-09-05 MED ORDER — OXYCODONE HCL 5 MG/5ML PO SOLN: 5.0000 mg | Freq: Once | ORAL | Status: AC | PRN

## 2012-09-05 MED ORDER — PROPOFOL INFUSION 10 MG/ML OPTIME
INTRAVENOUS | Status: DC | PRN
  Administered 2012-09-05: 100 ug/kg/min via INTRAVENOUS

## 2012-09-05 MED ORDER — LIDOCAINE HCL (PF) 1 % IJ SOLN
INTRAMUSCULAR | Status: DC | PRN
  Administered 2012-09-05: 7 mL

## 2012-09-05 MED ORDER — OXYCODONE HCL 5 MG PO TABS: 5.0000 mg | ORAL_TABLET | Freq: Once | ORAL | Status: DC | PRN

## 2012-09-05 MED ORDER — CEFAZOLIN SODIUM 1-5 GM-% IV SOLN
INTRAVENOUS | Status: AC
  Filled 2012-09-05: qty 50

## 2012-09-05 MED ORDER — MEPERIDINE HCL 25 MG/ML IJ SOLN: 6.2500 mg | INTRAMUSCULAR | Status: DC | PRN

## 2012-09-05 MED ORDER — HEPARIN SODIUM (PORCINE) 1000 UNIT/ML IJ SOLN
INTRAMUSCULAR | Status: DC | PRN
  Administered 2012-09-05: 5000 [IU] via INTRAVENOUS

## 2012-09-05 MED ORDER — SODIUM CHLORIDE 0.9 % IV SOLN
INTRAVENOUS | Status: DC
  Administered 2012-09-05 (×2): via INTRAVENOUS

## 2012-09-05 MED ORDER — OXYCODONE HCL 5 MG PO TABS
ORAL_TABLET | ORAL | Status: AC
  Administered 2012-09-05: 5 mg via ORAL
  Filled 2012-09-05: qty 1

## 2012-09-05 MED ORDER — THROMBIN 20000 UNITS EX SOLR
CUTANEOUS | Status: DC | PRN
  Administered 2012-09-05: 09:00:00 via TOPICAL

## 2012-09-05 MED ORDER — LIDOCAINE HCL (PF) 1 % IJ SOLN
INTRAMUSCULAR | Status: DC | PRN
  Administered 2012-09-05: 29 mL

## 2012-09-05 MED ORDER — OXYCODONE-ACETAMINOPHEN 5-325 MG PO TABS
2.0000 | ORAL_TABLET | Freq: Four times a day (QID) | ORAL | Status: DC | PRN
  Administered 2012-09-05 (×2): 1 via ORAL

## 2012-09-05 MED ORDER — OXYCODONE HCL 5 MG/5ML PO SOLN: 5.0000 mg | Freq: Once | ORAL | Status: DC | PRN

## 2012-09-05 MED ORDER — OXYCODONE HCL 5 MG PO TABS: 5.0000 mg | ORAL_TABLET | ORAL | Status: DC | PRN

## 2012-09-05 MED ORDER — OXYCODONE HCL 5 MG PO TABS
5.0000 mg | ORAL_TABLET | Freq: Once | ORAL | Status: AC | PRN
  Administered 2012-09-05: 5 mg via ORAL

## 2012-09-05 MED ORDER — PROTAMINE SULFATE 10 MG/ML IV SOLN
INTRAVENOUS | Status: DC | PRN
  Administered 2012-09-05: 10 mg via INTRAVENOUS
  Administered 2012-09-05: 20 mg via INTRAVENOUS
  Administered 2012-09-05 (×2): 10 mg via INTRAVENOUS

## 2012-09-05 MED ORDER — MUPIROCIN 2 % EX OINT
TOPICAL_OINTMENT | CUTANEOUS | Status: AC
  Administered 2012-09-05: 1 via NASAL
  Filled 2012-09-05: qty 22

## 2012-09-05 MED ORDER — CEFAZOLIN SODIUM 1-5 GM-% IV SOLN
INTRAVENOUS | Status: DC | PRN
  Administered 2012-09-05: 1 g via INTRAVENOUS

## 2012-09-05 MED ORDER — SODIUM CHLORIDE 0.9 % IR SOLN
Status: DC | PRN
  Administered 2012-09-05: 08:00:00

## 2012-09-05 MED ORDER — SODIUM CHLORIDE 0.9 % IV SOLN
INTRAVENOUS | Status: DC | PRN
  Administered 2012-09-05 (×2): via INTRAVENOUS

## 2012-09-05 SURGICAL SUPPLY — 43 items
BLADE SURG CLIPPER 3M 9600 (MISCELLANEOUS) ×3 IMPLANT
CANISTER SUCTION 2500CC (MISCELLANEOUS) ×3 IMPLANT
CLIP TI MEDIUM 6 (CLIP) ×3 IMPLANT
CLIP TI WIDE RED SMALL 6 (CLIP) ×3 IMPLANT
CLOTH BEACON ORANGE TIMEOUT ST (SAFETY) ×3 IMPLANT
COVER SURGICAL LIGHT HANDLE (MISCELLANEOUS) ×3 IMPLANT
DECANTER SPIKE VIAL GLASS SM (MISCELLANEOUS) ×3 IMPLANT
DERMABOND ADVANCED (GAUZE/BANDAGES/DRESSINGS) ×1
DERMABOND ADVANCED .7 DNX12 (GAUZE/BANDAGES/DRESSINGS) ×2 IMPLANT
ELECT REM PT RETURN 9FT ADLT (ELECTROSURGICAL) ×3
ELECTRODE REM PT RTRN 9FT ADLT (ELECTROSURGICAL) ×2 IMPLANT
GAUZE SPONGE 2X2 8PLY STRL LF (GAUZE/BANDAGES/DRESSINGS) IMPLANT
GAUZE SPONGE 4X4 16PLY XRAY LF (GAUZE/BANDAGES/DRESSINGS) ×3 IMPLANT
GEL ULTRASOUND 20GR AQUASONIC (MISCELLANEOUS) ×3 IMPLANT
GLOVE BIO SURGEON STRL SZ7.5 (GLOVE) ×3 IMPLANT
GLOVE BIOGEL M 6.5 STRL (GLOVE) ×3 IMPLANT
GLOVE BIOGEL PI IND STRL 6.5 (GLOVE) ×4 IMPLANT
GLOVE BIOGEL PI IND STRL 7.0 (GLOVE) ×2 IMPLANT
GLOVE BIOGEL PI IND STRL 7.5 (GLOVE) ×6 IMPLANT
GLOVE BIOGEL PI INDICATOR 6.5 (GLOVE) ×2
GLOVE BIOGEL PI INDICATOR 7.0 (GLOVE) ×1
GLOVE BIOGEL PI INDICATOR 7.5 (GLOVE) ×3
GLOVE SURG SS PI 6.5 STRL IVOR (GLOVE) ×3 IMPLANT
GLOVE SURG SS PI 7.5 STRL IVOR (GLOVE) ×6 IMPLANT
GOWN PREVENTION PLUS XLARGE (GOWN DISPOSABLE) ×9 IMPLANT
GOWN STRL NON-REIN LRG LVL3 (GOWN DISPOSABLE) ×9 IMPLANT
GRAFT GORETEX STRT 4-7X45 (Vascular Products) ×3 IMPLANT
KIT BASIN OR (CUSTOM PROCEDURE TRAY) ×3 IMPLANT
KIT ROOM TURNOVER OR (KITS) ×3 IMPLANT
LOOP VESSEL MINI RED (MISCELLANEOUS) IMPLANT
NS IRRIG 1000ML POUR BTL (IV SOLUTION) ×3 IMPLANT
PACK CV ACCESS (CUSTOM PROCEDURE TRAY) ×3 IMPLANT
PAD ARMBOARD 7.5X6 YLW CONV (MISCELLANEOUS) ×6 IMPLANT
SPONGE GAUZE 2X2 STER 10/PKG (GAUZE/BANDAGES/DRESSINGS)
SPONGE SURGIFOAM ABS GEL 100 (HEMOSTASIS) ×3 IMPLANT
SUT PROLENE 6 0 CC (SUTURE) ×6 IMPLANT
SUT VIC AB 3-0 SH 27 (SUTURE) ×2
SUT VIC AB 3-0 SH 27X BRD (SUTURE) ×4 IMPLANT
SUT VICRYL 4-0 PS2 18IN ABS (SUTURE) ×6 IMPLANT
TOWEL OR 17X24 6PK STRL BLUE (TOWEL DISPOSABLE) ×3 IMPLANT
TOWEL OR 17X26 10 PK STRL BLUE (TOWEL DISPOSABLE) ×3 IMPLANT
UNDERPAD 30X30 INCONTINENT (UNDERPADS AND DIAPERS) ×3 IMPLANT
WATER STERILE IRR 1000ML POUR (IV SOLUTION) ×3 IMPLANT

## 2012-09-05 SURGICAL SUPPLY — 37 items
CANISTER SUCTION 2500CC (MISCELLANEOUS) ×3 IMPLANT
CLOTH BEACON ORANGE TIMEOUT ST (SAFETY) ×3 IMPLANT
COVER SURGICAL LIGHT HANDLE (MISCELLANEOUS) ×3 IMPLANT
DERMABOND ADVANCED (GAUZE/BANDAGES/DRESSINGS) ×1
DERMABOND ADVANCED .7 DNX12 (GAUZE/BANDAGES/DRESSINGS) ×2 IMPLANT
ELECT REM PT RETURN 9FT ADLT (ELECTROSURGICAL) ×3
ELECTRODE REM PT RTRN 9FT ADLT (ELECTROSURGICAL) ×2 IMPLANT
GEL ULTRASOUND 20GR AQUASONIC (MISCELLANEOUS) ×3 IMPLANT
GLOVE BIO SURGEON STRL SZ7.5 (GLOVE) ×3 IMPLANT
GLOVE BIOGEL PI IND STRL 6.5 (GLOVE) ×4 IMPLANT
GLOVE BIOGEL PI IND STRL 7.5 (GLOVE) ×2 IMPLANT
GLOVE BIOGEL PI INDICATOR 6.5 (GLOVE) ×2
GLOVE BIOGEL PI INDICATOR 7.5 (GLOVE) ×1
GLOVE SS BIOGEL STRL SZ 6.5 (GLOVE) ×2 IMPLANT
GLOVE SUPERSENSE BIOGEL SZ 6.5 (GLOVE) ×1
GLOVE SURG SS PI 7.0 STRL IVOR (GLOVE) ×3 IMPLANT
GLOVE SURG SS PI 7.5 STRL IVOR (GLOVE) ×3 IMPLANT
GOWN PREVENTION PLUS XLARGE (GOWN DISPOSABLE) ×3 IMPLANT
GOWN STRL NON-REIN LRG LVL3 (GOWN DISPOSABLE) ×6 IMPLANT
GOWN STRL REIN XL XLG (GOWN DISPOSABLE) ×6 IMPLANT
KIT BASIN OR (CUSTOM PROCEDURE TRAY) ×3 IMPLANT
KIT ROOM TURNOVER OR (KITS) ×3 IMPLANT
LOOP VESSEL MINI RED (MISCELLANEOUS) IMPLANT
NS IRRIG 1000ML POUR BTL (IV SOLUTION) ×3 IMPLANT
PACK CV ACCESS (CUSTOM PROCEDURE TRAY) ×3 IMPLANT
PAD ARMBOARD 7.5X6 YLW CONV (MISCELLANEOUS) ×6 IMPLANT
SPONGE SURGIFOAM ABS GEL 100 (HEMOSTASIS) IMPLANT
SUT PROLENE 6 0 CC (SUTURE) IMPLANT
SUT SILK 0 (SUTURE) ×3 IMPLANT
SUT SILK 2 0 FS (SUTURE) ×3 IMPLANT
SUT VIC AB 3-0 SH 27 (SUTURE) ×1
SUT VIC AB 3-0 SH 27X BRD (SUTURE) ×2 IMPLANT
SUT VICRYL 4-0 PS2 18IN ABS (SUTURE) ×6 IMPLANT
TOWEL OR 17X24 6PK STRL BLUE (TOWEL DISPOSABLE) ×3 IMPLANT
TOWEL OR 17X26 10 PK STRL BLUE (TOWEL DISPOSABLE) ×3 IMPLANT
UNDERPAD 30X30 INCONTINENT (UNDERPADS AND DIAPERS) ×3 IMPLANT
WATER STERILE IRR 1000ML POUR (IV SOLUTION) ×3 IMPLANT

## 2012-09-05 NOTE — Progress Notes (Signed)
1610  Received Pt back again in Post Op.   Left fingers are warm to touch, radial pulse present...the patient denies pain at this time..  Eating crackers and drinking soda.........DA

## 2012-09-05 NOTE — Transfer of Care (Signed)
Immediate Anesthesia Transfer of Care Note  Patient: Annette Roach  Procedure(s) Performed: Procedure(s) (LRB) with comments: LIGATION ARTERIOVENOUS GORTEX GRAFT (Left)  Patient Location: PACU  Anesthesia Type:MAC  Level of Consciousness: awake, alert , oriented and patient cooperative  Airway & Oxygen Therapy: Patient Spontanous Breathing  Post-op Assessment: Report given to PACU RN, Post -op Vital signs reviewed and stable and Patient moving all extremities  Post vital signs: Reviewed and stable  Complications: No apparent anesthesia complications

## 2012-09-05 NOTE — Anesthesia Preprocedure Evaluation (Addendum)
Anesthesia Evaluation  Patient identified by MRN, date of birth, ID band Patient awake    Reviewed: Allergy & Precautions, H&P , NPO status   Airway Mallampati: II TM Distance: >3 FB Neck ROM: Full    Dental  (+) Dental Advisory Given   Pulmonary shortness of breath and with exertion, COPD COPD inhaler,  breath sounds clear to auscultation        Cardiovascular hypertension, Pt. on medications and Pt. on home beta blockers + CAD Rhythm:Regular Rate:Normal     Neuro/Psych  Headaches, PSYCHIATRIC DISORDERS Peripheral Neuorpathy    GI/Hepatic GERD-  Medicated and Controlled,  Endo/Other  diabetes, Well Controlled, Type 2  Renal/GU ESRF and DialysisRenal disease     Musculoskeletal   Abdominal   Peds  Hematology   Anesthesia Other Findings HD last on Saturday in [place]  Reproductive/Obstetrics                         Anesthesia Physical Anesthesia Plan  ASA: III  Anesthesia Plan: General and MAC   Post-op Pain Management:    Induction:   Airway Management Planned: LMA  Additional Equipment:   Intra-op Plan:   Post-operative Plan: Extubation in OR  Informed Consent: I have reviewed the patients History and Physical, chart, labs and discussed the procedure including the risks, benefits and alternatives for the proposed anesthesia with the patient or authorized representative who has indicated his/her understanding and acceptance.   Dental advisory given  Plan Discussed with: Surgeon, CRNA and Anesthesiologist  Anesthesia Plan Comments:        Anesthesia Quick Evaluation

## 2012-09-05 NOTE — Anesthesia Postprocedure Evaluation (Signed)
  Anesthesia Post-op Note  Patient: Annette Roach  Procedure(s) Performed: Procedure(s) (LRB) with comments: LIGATION ARTERIOVENOUS GORTEX GRAFT (Left)  Patient Location: PACU  Anesthesia Type:MAC  Level of Consciousness: awake  Airway and Oxygen Therapy: Patient Spontanous Breathing  Post-op Pain: mild  Post-op Assessment: Post-op Vital signs reviewed, Patient's Cardiovascular Status Stable, Respiratory Function Stable, Patent Airway, No signs of Nausea or vomiting and Pain level controlled  Post-op Vital Signs: stable  Complications: No apparent anesthesia complications

## 2012-09-05 NOTE — Op Note (Signed)
Procedure: Ligation of left radial cephalic AV fistula, Placement Left Upper Arm AV graft  Preop: ESRD  Postop: ESRD  Anesthesia: Local with IV sedation  Findings:4-7 mm PTFE end to end to axillary vein   Procedure Details:  The left upper extremity was prepped and draped in usual sterile fashion.  A longitudinal incision was made through pre existing scar near the wrist.  A pre existing radial cephalic AV fistula was dissected free circumferentially and ligate with a 2 0 silk tie just distal to the anastomosis. The subcutaneous tissue was re approximated with a 3 0 Vicryl followed by the skin with a 3 0 subcuticular stitch.   A longitudinal incision was then made near the antecubital crease the left arm.  There were no suitable antecubital veins for outflow.   The incision was carried into the subcutaneous tissues down to level of the brachial artery.  Next the brachial artery was dissected free in the medial portion incision. The artery was  3-4 mm in diameter. The vessel loops were placed proximal and distal to the planned site of arteriotomy. At this point, a longitudinal incision was made in the axilla and carried through the subcutaneous tissues and fascia to expose the axillary vein.  The nerves were protected.  The vein was approximately 4-5 mm in diameter. Next, a subcutaneous tunnel was created connecting the upper arm to the lower arm incision in an arcing configuration over the biceps muscle.  A 4-7 mm PTFE graft was then brought through this subcutaneous tunnel. The patient was given 5000 units of intravenous heparin. After appropriate circulation time, the vessel loops were used to control the artery. A longitudinal opening was made in the right brachial artery.  The 4 mm end of the graft was beveled and sewn end to side to the artery using a 6 0 prolene.  At completion of the anastomosis the artery was forward bled, backbled and thoroughly flushed.  The anastomosis was secured, vessel  loops were released and there was palpable pulse in the graft.  The graft was clamped just above the arterial anastomosis with a fistula clamp. The graft was then pulled taut to length at the axillary incision.  The axillary vein was controlled with a fine bulldog clamp in the upper axilla.  The vein was transected and spatulated.  The distal end of the graft was then beveled and sewn end to end to the vein using a running 6 0 prolene.  Just prior to completion of the anastomosis, everything was forward bled, back bled and thoroughly flushed.  The anastomosis was secured and the fistula clamp removed from the proximal graft.  A thrill was immediately palpable in the graft. The patient was given 50 mg of protamine to assist with hemostasis.  After hemostasis was obtained, the subcutaneous tissues were reapproximated using a running 3-0 Vicryl suture. The skin was then closed with a 4 0 Vicryl subcuticular stitch. Dermabond was applied to the skin incisions.  The patient tolerated the procedure well and there were no complications.  Instrument sponge and needle count was correct at the end of the case.  The patient was taken to the recovery room in stable condition. The patient had a palpable radial pulse at the end of the case.  Fabienne Bruns, MD [facility] Office: [phone] Pager: [phone]

## 2012-09-05 NOTE — Anesthesia Preprocedure Evaluation (Signed)
Anesthesia Evaluation  Patient identified by MRN, date of birth, ID band Patient awake  General Assessment Comment:Pt done this am , returns for ligation of fistula soon afterword  Reviewed: Allergy & Precautions, H&P , NPO status   Airway Mallampati: II TM Distance: >3 FB Neck ROM: Full    Dental  (+) Dental Advisory Given   Pulmonary shortness of breath and with exertion, COPD COPD inhaler,  breath sounds clear to auscultation        Cardiovascular hypertension, Pt. on medications and Pt. on home beta blockers + CAD Rhythm:Regular Rate:Normal     Neuro/Psych  Headaches, PSYCHIATRIC DISORDERS Peripheral Neuorpathy    GI/Hepatic GERD-  Medicated and Controlled,  Endo/Other  diabetes, Well Controlled, Type 2  Renal/GU CRFRenal disease     Musculoskeletal   Abdominal   Peds  Hematology  (+) anemia ,   Anesthesia Other Findings HD last on Saturday in [place]  Reproductive/Obstetrics                           Anesthesia Physical Anesthesia Plan  ASA: III and emergent  Anesthesia Plan: General ETT   Post-op Pain Management:    Induction:   Airway Management Planned:   Additional Equipment:   Intra-op Plan:   Post-operative Plan:   Informed Consent: I have reviewed the patients History and Physical, chart, labs and discussed the procedure including the risks, benefits and alternatives for the proposed anesthesia with the patient or authorized representative who has indicated his/her understanding and acceptance.     Plan Discussed with:   Anesthesia Plan Comments:         Anesthesia Quick Evaluation

## 2012-09-05 NOTE — Transfer of Care (Signed)
Immediate Anesthesia Transfer of Care Note  Patient: Annette Roach  Procedure(s) Performed: Procedure(s) (LRB) with comments: INSERTION OF ARTERIOVENOUS (AV) GORE-TEX GRAFT ARM (Left) - Using 4-11mm x 45 cm Vascular stretch goretex graft LIGATION OF ARTERIOVENOUS  FISTULA (Left)  Patient Location: PACU  Anesthesia Type:MAC  Level of Consciousness: awake, alert , oriented and patient cooperative  Airway & Oxygen Therapy: Patient Spontanous Breathing and Patient connected to nasal cannula oxygen  Post-op Assessment: Report given to PACU RN, Post -op Vital signs reviewed and stable and Patient moving all extremities X 4  Post vital signs: Reviewed and stable  Complications: No apparent anesthesia complications

## 2012-09-05 NOTE — Anesthesia Postprocedure Evaluation (Signed)
  Anesthesia Post-op Note  Patient: Annette Roach  Procedure(s) Performed: Procedure(s) (LRB) with comments: INSERTION OF ARTERIOVENOUS (AV) GORE-TEX GRAFT ARM (Left) - Using 4-44mm x 45 cm Vascular stretch goretex graft LIGATION OF ARTERIOVENOUS  FISTULA (Left)  Patient Location: PACU  Anesthesia Type:MAC  Level of Consciousness: awake and sedated  Airway and Oxygen Therapy: Patient Spontanous Breathing  Post-op Pain: mild  Post-op Assessment: Post-op Vital signs reviewed  Post-op Vital Signs: stable  Complications: No apparent anesthesia complications

## 2012-09-05 NOTE — Preoperative (Signed)
Beta Blockers   Reason not to administer Beta Blockers:Not Applicable, Pt took Coreg at 0430 today 

## 2012-09-05 NOTE — Op Note (Signed)
Procedure: Removal of left arm AV Graft Preop/Postop diagnosis: Ischemic steal Anesthesia: Local  Procedure details: After obtaining informed consent, the patient was taken to the operating room.  After induction of general anesthesia, the entire left upper extremity was prepped and draped in usual sterile fashion. A transverse incision was made through a pre-existing incision in the antecubital crease. The incision was carried into the subcutaneous tissues down to level the graft. The arterial limbs of the graft were dissected free circumferentially. These were mobilized as completely as possible down to level of the arterial anastomosis.  The arterial limb the graft and dissected free circumferentially and doubly ligated proximally and then divided. I then proceeded to completely mobilize the graft with cautery. The axillary incision was infitrated with local and reopened in similar fashion.  The axillary vein was ligated just above the graft.  The graft was transected and completely removed.  All wounds were thoroughly irrigated with normal saline solution. Subcutaneous tissues of the antecubital and distal forearm incision closed with a running 3-0 Vicryl suture. The skin of all incisions was closed with a running 4 0 vicryl subcuticular stitch.  The pt had palpable radial pulse at the end of the case.The patient tolerated procedure well and there were no complications. Instrument sponge and needle counts were correct at the end of the case. The patient was taken to the recovery room in stable condition.    Fabienne Bruns, MD  [facility]  Office: [phone]  Pager: [phone]

## 2012-09-05 NOTE — Progress Notes (Signed)
Pt with worsening pain in left hand since arrival in PACU.  2+ radial pulse hand warm.  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   Will ligate graft today.  Will need to reevaluate for right arm access in future.  Fabienne Bruns, MD [facility] Office: [phone] Pager: [phone]

## 2012-09-05 NOTE — Interval H&P Note (Signed)
History and Physical Interval Note:  09/05/2012 7:25 AM  Annette Roach  has presented today for surgery, with the diagnosis of End Stage Renal Disease  The various methods of treatment have been discussed with the patient and family. After consideration of risks, benefits and other options for treatment, the patient has consented to  Procedure(s) (LRB) with comments: INSERTION OF ARTERIOVENOUS (AV) GORE-TEX GRAFT ARM (Left) - Insertion left arm AV Graft; and Ligate left arm AV Fistula LIGATION OF ARTERIOVENOUS  FISTULA (Left) as a surgical intervention .  The patient's history has been reviewed, patient examined, no change in status, stable for surgery.  I have reviewed the patient's chart and labs.  Questions were answered to the patient's satisfaction.     FIELDS,CHARLES E

## 2012-09-05 NOTE — Progress Notes (Addendum)
11:20   Patient arrived in Post op.Marland KitchenMarland KitchenMarland KitchenModerately good cap refill on left fingers....radial pulse detected---moderate pulse. Good bruit heard over gortex graft, in left upper arm............da 11:40   Pt complaining of very bad pain in left hand and fingers..cap refill good, hand warm to touch. Pt now states pain med that was given earlier is not helping...she is here in post op, moaning, facial grimacing and holding hand...Marland KitchenMarland Kitchentrying to get PA or Dr. Darrick Penna to come look....Marland KitchenMarland KitchenDA 1155  Patient requested warm pack to left hand, and would like some tylenol...Marland KitchenMarland KitchenMarland Kitchenwill call Dr. Darrick Penna...DA 1205 Ordered received to give 1 percocet...Marland KitchenMarland KitchenI elected to give patient only 1/2 of percocet with tyelonl, since she had vicodin at 10:49....da 1230.....10/10  In left fingers, no where else.   DA 1300  Have given patient other half of percocet....the patient is still complaining of severe pain, grasping and shaking hand, moaning and grimacing....da [phone]  Patient's daughter states the pain is getting worse..the patient states pain is mainly in her fingers, no radiating up the arm, no other sites hurt..Fingertips warm, hand warm, radial pulse palpated...good bruit noted in upper arm. Dr  Imogene Burn should be here to see patient............DA 1355  Dr. Darrick Penna in....checked patient's hand.pulse and felt of avgg in left arm.  Dr. Darrick Penna discussed option of going home with high probability that pain will increase and contractures occur in hand or going to surgery to tie off avgg, then attempting access in right arm after left arm is healed.Marland Kitchen He  Informed patient that nerves are not getting enough blood supply causing her pain. Patient agreed to go back to surgery. (PER Leandro Reasoner, RN)

## 2012-09-05 NOTE — H&P (View-Only) (Signed)
Patient is an 79 year old female referred for evaluation of a poorly functioning left radiocephalic AV fistula. She recently underwent a fistulogram which showed patency of the fistula but occlusion and subtotal occlusion of the venous outflow near the antecubital area. The central venous structures were patent. The patient is currently dialyzing via a catheter.  Review of systems: The patient denies shortness of breath. She denies chest pain.  Physical exam: Filed Vitals:   08/18/12 0904  BP: 126/50  Pulse: 81  Height: 5\' 2"  (1.575 m)  Weight: 151 lb 6.4 oz (68.675 kg)  SpO2: 97%   Extremities: Pulsatile left radiocephalic AV fistula  Neck: Dialysis catheter present  Data: The patient had a vein mapping ultrasound on 04/28/2012 which was reviewed. This does not show a reasonable vein for creation of a fistula.  Assessment: I discussed options with the patient today of possibly taking a vein from her right arm or right leg to reconstruct her fistula. The other option would be to place an AV graft. After lengthy discussions with the patient and her family she has opted for graft at this point.  Plan: Left arm AV graft which will be scheduled in the next few weeks  Fabienne Bruns, MD [facility] Office: [phone] Pager: [phone]

## 2012-09-05 NOTE — Progress Notes (Signed)
Pt has pre existing right upper chest Diatek, both ports are capped & clamped.

## 2012-09-06 ENCOUNTER — Telehealth: Payer: Self-pay | Admitting: Vascular Surgery

## 2012-09-06 ENCOUNTER — Encounter (HOSPITAL_COMMUNITY): Payer: Self-pay | Admitting: Vascular Surgery

## 2012-09-06 NOTE — Telephone Encounter (Addendum)
Message copied by Shari Prows on Tue Sep 06, 2012  9:32 AM ------      Message from: Melene Plan      Created: Mon Sep 05, 2012  5:11 PM                   ----- Message -----         From: Sherren Kerns, MD         Sent: 09/05/2012   3:38 PM           To: Reuel Derby, Melene Plan, RN            Removal left upper arm graft for steal            She will need appt in a few weeks to determine access for right arm            Leonette Most  I scheduled an appt for the above patient on 10/13/12 at 9:30am for vascular lab study and see CEF at 10:15am. I scheduled this with the pt's daughter Okey Regal and also mailed an appt letter.awt

## 2012-09-23 ENCOUNTER — Other Ambulatory Visit: Payer: Self-pay | Admitting: *Deleted

## 2012-09-23 DIAGNOSIS — N186 End stage renal disease: Secondary | ICD-10-CM

## 2012-09-23 DIAGNOSIS — Z01810 Encounter for preprocedural cardiovascular examination: Secondary | ICD-10-CM

## 2012-10-06 ENCOUNTER — Ambulatory Visit: Payer: Medicare Other | Admitting: Vascular Surgery

## 2012-10-13 ENCOUNTER — Ambulatory Visit: Payer: Medicare Other | Admitting: Vascular Surgery

## 2012-10-19 ENCOUNTER — Encounter: Payer: Self-pay | Admitting: Vascular Surgery

## 2012-10-20 ENCOUNTER — Ambulatory Visit (INDEPENDENT_AMBULATORY_CARE_PROVIDER_SITE_OTHER): Payer: Medicare Other | Admitting: Vascular Surgery

## 2012-10-20 ENCOUNTER — Encounter (INDEPENDENT_AMBULATORY_CARE_PROVIDER_SITE_OTHER): Payer: Medicare Other | Admitting: *Deleted

## 2012-10-20 ENCOUNTER — Encounter: Payer: Self-pay | Admitting: Vascular Surgery

## 2012-10-20 VITALS — BP 109/50 | HR 81 | Ht 62.0 in | Wt 157.5 lb

## 2012-10-20 DIAGNOSIS — Z01810 Encounter for preprocedural cardiovascular examination: Secondary | ICD-10-CM

## 2012-10-20 DIAGNOSIS — N186 End stage renal disease: Secondary | ICD-10-CM

## 2012-10-20 NOTE — Progress Notes (Signed)
Patient is an 79 year old female returns for followup today. She recently had ligation of a left brachiocephalic AV fistula for steal to the left hand.  She states that the left hand is somewhat better but is still painful on a daily basis. She currently is dialyzing via a catheter.  Review of systems: She denies shortness of breath. She denies chest pain.  Physical exam: Filed Vitals:   10/20/12 1011  BP: 109/50  Pulse: 81  Height: 5\' 2"  (1.575 m)  Weight: 157 lb 8 oz (71.442 kg)  SpO2: 97%   Left upper extremity: Well-healed antecubital and left forearm incisions 2+ brachial and radial pulse, no significant atrophy, no significant point tenderness  Right upper extremity: 2+ brachial and radial pulse  Data: The patient had a vein mapping ultrasound of her right upper extremity today. Her cephalic vein is not usable for creation of a fistula. She does have a reasonable basilic vein on the right side between 3 and 4 mm throughout most of its course.  Assessment: Persistent left hand pain post ischemic steal.  This is slightly improved after ligation of the fistula but certainly not back to baseline. Hopefully this will continue to improve with time. However it was discussed with the patient that this may be a permanent deficit. I also discussed with the patient today the possibility of placing a new access in the right upper extremity. I did discuss with her that she would be at some risk of steal on this side as well. I discussed with her the possibility of placing a basilic vein transposition fistula. If she decides to proceed forward we would do this is a two-stage fistula to make sure that she did not get an ischemic steal in the right hand. Also since the vein may be fairly marginal we would wait to see if the fistula developed before Superficialization.  The patient is somewhat reluctant to proceed with access in the right upper extremity due to the problems that she had in her left upper  extremity. I did discuss with her the risks of catheter infection. She will have further discussions with her daughter and call us back if she was to proceed with a new access. Otherwise she will followup on as-needed basis.  Plan: See above  Fabienne Bruns, MD [facility] Office: [phone] Pager: [phone]

## 2012-11-02 ENCOUNTER — Other Ambulatory Visit: Payer: Self-pay

## 2012-11-04 ENCOUNTER — Encounter (HOSPITAL_COMMUNITY): Payer: Self-pay | Admitting: *Deleted

## 2012-11-06 MED ORDER — DEXTROSE 5 % IV SOLN
1.5000 g | INTRAVENOUS | Status: AC
  Administered 2012-11-07: 1.5 g via INTRAVENOUS
  Filled 2012-11-06: qty 1.5

## 2012-11-07 ENCOUNTER — Encounter (HOSPITAL_COMMUNITY): Payer: Self-pay | Admitting: Anesthesiology

## 2012-11-07 ENCOUNTER — Ambulatory Visit (HOSPITAL_COMMUNITY): Payer: Medicare Other | Admitting: Anesthesiology

## 2012-11-07 ENCOUNTER — Other Ambulatory Visit: Payer: Self-pay | Admitting: *Deleted

## 2012-11-07 ENCOUNTER — Encounter (HOSPITAL_COMMUNITY)
Admission: RE | Disposition: A | Discharge status: Home / Self Care | Payer: Self-pay | Source: Ambulatory Visit | Attending: Vascular Surgery

## 2012-11-07 ENCOUNTER — Ambulatory Visit (HOSPITAL_COMMUNITY)
Admission: RE | Admit: 2012-11-07 | Discharge: 2012-11-07 | Disposition: A | Discharge status: Home / Self Care | Payer: Medicare Other | Source: Ambulatory Visit | Attending: Vascular Surgery | Admitting: Vascular Surgery

## 2012-11-07 DIAGNOSIS — N186 End stage renal disease: Secondary | ICD-10-CM

## 2012-11-07 DIAGNOSIS — I120 Hypertensive chronic kidney disease with stage 5 chronic kidney disease or end stage renal disease: Secondary | ICD-10-CM | POA: Insufficient documentation

## 2012-11-07 DIAGNOSIS — I739 Peripheral vascular disease, unspecified: Secondary | ICD-10-CM | POA: Insufficient documentation

## 2012-11-07 DIAGNOSIS — E119 Type 2 diabetes mellitus without complications: Secondary | ICD-10-CM | POA: Insufficient documentation

## 2012-11-07 DIAGNOSIS — I2510 Atherosclerotic heart disease of native coronary artery without angina pectoris: Secondary | ICD-10-CM | POA: Insufficient documentation

## 2012-11-07 DIAGNOSIS — Z4931 Encounter for adequacy testing for hemodialysis: Secondary | ICD-10-CM

## 2012-11-07 DIAGNOSIS — J449 Chronic obstructive pulmonary disease, unspecified: Secondary | ICD-10-CM | POA: Insufficient documentation

## 2012-11-07 DIAGNOSIS — K219 Gastro-esophageal reflux disease without esophagitis: Secondary | ICD-10-CM | POA: Insufficient documentation

## 2012-11-07 DIAGNOSIS — Z992 Dependence on renal dialysis: Secondary | ICD-10-CM | POA: Insufficient documentation

## 2012-11-07 DIAGNOSIS — R51 Headache: Secondary | ICD-10-CM | POA: Insufficient documentation

## 2012-11-07 DIAGNOSIS — J4489 Other specified chronic obstructive pulmonary disease: Secondary | ICD-10-CM | POA: Insufficient documentation

## 2012-11-07 HISTORY — PX: AV FISTULA PLACEMENT: SHX1204

## 2012-11-07 HISTORY — DX: Other specified complication of vascular prosthetic devices, implants and grafts, initial encounter: T82.898A

## 2012-11-07 LAB — POCT I-STAT 4, (NA,K, GLUC, HGB,HCT)
Glucose, Bld: 163 mg/dL — ABNORMAL HIGH (ref 70–99)
HCT: 36 % (ref 36.0–46.0)
Hemoglobin: 12.2 g/dL (ref 12.0–15.0)
Potassium: 5.4 mEq/L — ABNORMAL HIGH (ref 3.5–5.1)
Sodium: 136 mEq/L (ref 135–145)

## 2012-11-07 LAB — GLUCOSE, CAPILLARY: Glucose-Capillary: 136 mg/dL — ABNORMAL HIGH (ref 70–99)

## 2012-11-07 SURGERY — ARTERIOVENOUS (AV) FISTULA CREATION
Anesthesia: General | Site: Arm Upper | Laterality: Right | Wound class: Clean

## 2012-11-07 MED ORDER — SODIUM CHLORIDE 0.9 % IR SOLN
Status: DC | PRN
  Administered 2012-11-07: 12:00:00

## 2012-11-07 MED ORDER — PROPOFOL 10 MG/ML IV BOLUS
INTRAVENOUS | Status: DC | PRN
  Administered 2012-11-07: 70 mg via INTRAVENOUS

## 2012-11-07 MED ORDER — MUPIROCIN 2 % EX OINT
TOPICAL_OINTMENT | CUTANEOUS | Status: AC
  Administered 2012-11-07: 1 via NASAL
  Filled 2012-11-07: qty 22

## 2012-11-07 MED ORDER — LIDOCAINE HCL (CARDIAC) 20 MG/ML IV SOLN
INTRAVENOUS | Status: DC | PRN
  Administered 2012-11-07: 100 mg via INTRAVENOUS

## 2012-11-07 MED ORDER — MIDAZOLAM HCL 5 MG/5ML IJ SOLN
INTRAMUSCULAR | Status: DC | PRN
  Administered 2012-11-07: 2 mg via INTRAVENOUS

## 2012-11-07 MED ORDER — SODIUM CHLORIDE 0.9 % IV SOLN: INTRAVENOUS | Status: DC

## 2012-11-07 MED ORDER — 0.9 % SODIUM CHLORIDE (POUR BTL) OPTIME
TOPICAL | Status: DC | PRN
  Administered 2012-11-07: 1000 mL

## 2012-11-07 MED ORDER — HEPARIN SODIUM (PORCINE) 1000 UNIT/ML IJ SOLN
INTRAMUSCULAR | Status: DC | PRN
  Administered 2012-11-07: 5000 [IU] via INTRAVENOUS

## 2012-11-07 MED ORDER — OXYCODONE HCL 5 MG PO TABS: 5.0000 mg | ORAL_TABLET | Freq: Four times a day (QID) | ORAL | Status: DC | PRN

## 2012-11-07 MED ORDER — MUPIROCIN 2 % EX OINT
TOPICAL_OINTMENT | Freq: Two times a day (BID) | CUTANEOUS | Status: DC
  Administered 2012-11-07: 1 via NASAL

## 2012-11-07 MED ORDER — SODIUM CHLORIDE 0.9 % IV SOLN
INTRAVENOUS | Status: DC | PRN
  Administered 2012-11-07 (×2): via INTRAVENOUS

## 2012-11-07 MED ORDER — PHENYLEPHRINE HCL 10 MG/ML IJ SOLN
INTRAMUSCULAR | Status: DC | PRN
  Administered 2012-11-07: 40 ug via INTRAVENOUS
  Administered 2012-11-07 (×2): 80 ug via INTRAVENOUS
  Administered 2012-11-07: 40 ug via INTRAVENOUS
  Administered 2012-11-07: 80 ug via INTRAVENOUS
  Administered 2012-11-07: 40 ug via INTRAVENOUS
  Administered 2012-11-07 (×2): 80 ug via INTRAVENOUS

## 2012-11-07 MED ORDER — EPHEDRINE SULFATE 50 MG/ML IJ SOLN
INTRAMUSCULAR | Status: DC | PRN
  Administered 2012-11-07: 10 mg via INTRAVENOUS
  Administered 2012-11-07: 5 mg via INTRAVENOUS
  Administered 2012-11-07: 10 mg via INTRAVENOUS

## 2012-11-07 MED ORDER — PROTAMINE SULFATE 10 MG/ML IV SOLN
INTRAVENOUS | Status: DC | PRN
  Administered 2012-11-07 (×3): 10 mg via INTRAVENOUS
  Administered 2012-11-07: 20 mg via INTRAVENOUS

## 2012-11-07 MED ORDER — FENTANYL CITRATE 0.05 MG/ML IJ SOLN
INTRAMUSCULAR | Status: DC | PRN
  Administered 2012-11-07: 100 ug via INTRAVENOUS

## 2012-11-07 MED ORDER — SODIUM POLYSTYRENE SULFONATE 15 GM/60ML PO SUSP
30.0000 g | Freq: Once | ORAL | Status: DC
  Filled 2012-11-07: qty 120

## 2012-11-07 SURGICAL SUPPLY — 40 items
CANISTER SUCTION 2500CC (MISCELLANEOUS) ×2 IMPLANT
CLIP TI MEDIUM 24 (CLIP) ×2 IMPLANT
CLIP TI WIDE RED SMALL 24 (CLIP) ×2 IMPLANT
CLOTH BEACON ORANGE TIMEOUT ST (SAFETY) ×2 IMPLANT
COVER PROBE W GEL 5X96 (DRAPES) ×2 IMPLANT
COVER SURGICAL LIGHT HANDLE (MISCELLANEOUS) ×2 IMPLANT
DECANTER SPIKE VIAL GLASS SM (MISCELLANEOUS) ×2 IMPLANT
DERMABOND ADVANCED (GAUZE/BANDAGES/DRESSINGS) ×1
DERMABOND ADVANCED .7 DNX12 (GAUZE/BANDAGES/DRESSINGS) ×1 IMPLANT
DRAIN PENROSE 1/4X12 LTX STRL (WOUND CARE) ×2 IMPLANT
ELECT REM PT RETURN 9FT ADLT (ELECTROSURGICAL) ×2
ELECTRODE REM PT RTRN 9FT ADLT (ELECTROSURGICAL) ×1 IMPLANT
GEL ULTRASOUND 20GR AQUASONIC (MISCELLANEOUS) IMPLANT
GLOVE BIO SURGEON STRL SZ 6.5 (GLOVE) ×4 IMPLANT
GLOVE BIO SURGEON STRL SZ7.5 (GLOVE) ×2 IMPLANT
GLOVE BIOGEL PI IND STRL 7.0 (GLOVE) ×1 IMPLANT
GLOVE BIOGEL PI IND STRL 7.5 (GLOVE) ×3 IMPLANT
GLOVE BIOGEL PI INDICATOR 7.0 (GLOVE) ×1
GLOVE BIOGEL PI INDICATOR 7.5 (GLOVE) ×3
GLOVE ECLIPSE 6.5 STRL STRAW (GLOVE) ×2 IMPLANT
GLOVE SURG SS PI 7.5 STRL IVOR (GLOVE) ×4 IMPLANT
GOWN PREVENTION PLUS XLARGE (GOWN DISPOSABLE) ×4 IMPLANT
GOWN PREVENTION PLUS XXLARGE (GOWN DISPOSABLE) ×2 IMPLANT
GOWN STRL NON-REIN LRG LVL3 (GOWN DISPOSABLE) ×4 IMPLANT
KIT BASIN OR (CUSTOM PROCEDURE TRAY) ×2 IMPLANT
KIT ROOM TURNOVER OR (KITS) ×2 IMPLANT
LOOP VESSEL MINI RED (MISCELLANEOUS) IMPLANT
NS IRRIG 1000ML POUR BTL (IV SOLUTION) ×2 IMPLANT
PACK CV ACCESS (CUSTOM PROCEDURE TRAY) ×2 IMPLANT
PAD ARMBOARD 7.5X6 YLW CONV (MISCELLANEOUS) ×4 IMPLANT
SPONGE SURGIFOAM ABS GEL 100 (HEMOSTASIS) IMPLANT
SUT PROLENE 6 0 CC (SUTURE) ×4 IMPLANT
SUT PROLENE 7 0 BV 1 (SUTURE) ×4 IMPLANT
SUT VIC AB 3-0 SH 27 (SUTURE) ×1
SUT VIC AB 3-0 SH 27X BRD (SUTURE) ×1 IMPLANT
SUT VICRYL 4-0 PS2 18IN ABS (SUTURE) ×2 IMPLANT
TOWEL OR 17X24 6PK STRL BLUE (TOWEL DISPOSABLE) ×2 IMPLANT
TOWEL OR 17X26 10 PK STRL BLUE (TOWEL DISPOSABLE) ×2 IMPLANT
UNDERPAD 30X30 INCONTINENT (UNDERPADS AND DIAPERS) ×2 IMPLANT
WATER STERILE IRR 1000ML POUR (IV SOLUTION) ×2 IMPLANT

## 2012-11-07 NOTE — H&P (View-Only) (Signed)
Patient is an 79-year-old female returns for followup today. She recently had ligation of a left brachiocephalic AV fistula for steal to the left hand.  She states that the left hand is somewhat better but is still painful on a daily basis. She currently is dialyzing via a catheter.  Review of systems: She denies shortness of breath. She denies chest pain.  Physical exam: Filed Vitals:   10/20/12 1011  BP: 109/50  Pulse: 81  Height: 5' 2" (1.575 m)  Weight: 157 lb 8 oz (71.442 kg)  SpO2: 97%   Left upper extremity: Well-healed antecubital and left forearm incisions 2+ brachial and radial pulse, no significant atrophy, no significant point tenderness  Right upper extremity: 2+ brachial and radial pulse  Data: The patient had a vein mapping ultrasound of her right upper extremity today. Her cephalic vein is not usable for creation of a fistula. She does have a reasonable basilic vein on the right side between 3 and 4 mm throughout most of its course.  Assessment: Persistent left hand pain post ischemic steal.  This is slightly improved after ligation of the fistula but certainly not back to baseline. Hopefully this will continue to improve with time. However it was discussed with the patient that this may be a permanent deficit. I also discussed with the patient today the possibility of placing a new access in the right upper extremity. I did discuss with her that she would be at some risk of steal on this side as well. I discussed with her the possibility of placing a basilic vein transposition fistula. If she decides to proceed forward we would do this is a two-stage fistula to make sure that she did not get an ischemic steal in the right hand. Also since the vein may be fairly marginal we would wait to see if the fistula developed before Superficialization.  The patient is somewhat reluctant to proceed with access in the right upper extremity due to the problems that she had in her left upper  extremity. I did discuss with her the risks of catheter infection. She will have further discussions with her daughter and call us back if she was to proceed with a new access. Otherwise she will followup on as-needed basis.  Plan: See above  Laterrica Libman, MD [facility] [place] Office: [phone] Pager: [phone]  

## 2012-11-07 NOTE — Discharge Instructions (Signed)
° ° °  11/07/2012 Annette Roach [id number] 08-22-1928  Surgeon(s): Sherren Kerns, MD  Procedure(s): ARTERIOVENOUS (AV) FISTULA CREATION-1st stage right BVT         Do not stick graft for 12 weeks

## 2012-11-07 NOTE — Interval H&P Note (Signed)
History and Physical Interval Note:  11/07/2012 10:40 AM  Annette Roach  has presented today for surgery, with the diagnosis of End Stage Renal Disease  The various methods of treatment have been discussed with the patient and family. After consideration of risks, benefits and other options for treatment, the patient has consented to  Procedure(s) with comments: BASCILIC VEIN TRANSPOSITION (Right) - Right 1st Stage Basilic Vein Transposition as a surgical intervention .  The patient's history has been reviewed, patient examined, no change in status, stable for surgery.  I have reviewed the patient's chart and labs.  Questions were answered to the patient's satisfaction.     FIELDS,CHARLES E

## 2012-11-07 NOTE — Progress Notes (Signed)
Spoke with marty renal pa informed her of pts surgery location and k=5.4 orders obtained and carried out will send kayexylate home with pt

## 2012-11-07 NOTE — Anesthesia Postprocedure Evaluation (Signed)
  Anesthesia Post-op Note  Patient: Annette Roach  Procedure(s) Performed: Procedure(s) with comments: ARTERIOVENOUS (AV) FISTULA CREATION (Right) - Bascilic Vein Fistula  Patient Location: PACU  Anesthesia Type:General  Level of Consciousness: awake, oriented, sedated and patient cooperative  Airway and Oxygen Therapy: Patient Spontanous Breathing  Post-op Pain: mild  Post-op Assessment: Post-op Vital signs reviewed, Patient's Cardiovascular Status Stable, Respiratory Function Stable, Patent Airway, No signs of Nausea or vomiting and Pain level controlled  Post-op Vital Signs: stable  Complications: No apparent anesthesia complications

## 2012-11-07 NOTE — Progress Notes (Signed)
Patient has screen at increased risk using the STOP BANG tool pre-operatively. Her score is 4, putting her at increased risk for sleep apnea.

## 2012-11-07 NOTE — Op Note (Signed)
Procedure: Right basilic vein transposition fistula first stage, Ultrasound guidance  Preoperative diagnosis: End-stage renal disease  Postoperative diagnosis: Same  Anesthesia: Gen.  Assistant: Doreatha Massed, PAC  Operative findings: 3 mm right basilic vein, 3 mm right brachial artery  Operative details: After obtaining informed consent, the patient was taken to the operating room. The patient was placed in supine position operating table. After induction of general anesthesia, the patient's entire right upper extremity was prepped and draped in the usual sterile fashion. Next ultrasound was used to identify the right basilic vein. A longitudinal incision was made just above the antecubital crease in order to expose the basilic vein. The vein was of good quality approximately 3 mm in diameter.  Care was taken to try to not injure any sensory nerves and all the motor nerves were identified and protected. The vein was dissected free circumferentially and small side branches ligated and divided between silk ties or clips. Next the brachial artery was exposed by deepening the basilic vein harvest incision just above the antecubital crease. The artery was approximately 3 mm in diameter with some spasm. This was dissected free circumferentially. Vessel loops were placed around it. There was some spasm within the artery after dissecting it free. Next the distal basilic vein was ligated with a 2 silk tie and the vein transected.  The patient was given 5000 units of intravenous heparin. Vessel loops were used to control the artery proximally and distally. A longitudinal arteriotomy was made with an 11 blade.  The vein was cut to length and sewn end of vein to side of artery using a running 7-0 Prolene suture. Just prior to completion of the anastomosis, it was forebled backbled and thoroughly flushed. The anastomosis was secured and the Vesseloops were released.  There was a palpable thrill in the proximal  aspect of the fistula. There was also good Doppler flow throughout the course of the fistula. At this point all subcutaneous tissues were reapproximated using running 3-0 Vicryl suture. All skin incisions were closed with running 4  0 Vicryl subcuticular stitch. Dermabond was applied to all incisions. The patient tolerated the procedure well and there were no complications. Instrument sponge needle counts were correct at the end of the case. The patient was taken to the recovery room in stable condition.The patient had a palpable radial pulse at the end of the case.  Fabienne Bruns, MD [facility] Office: [phone] Pager: [phone]

## 2012-11-07 NOTE — Anesthesia Preprocedure Evaluation (Signed)
Anesthesia Evaluation  Patient identified by MRN, date of birth, ID band Patient awake    Reviewed: Allergy & Precautions, H&P , NPO status , Patient's Chart, lab work & pertinent test results  Airway Mallampati: I TM Distance: >3 FB Neck ROM: full    Dental   Pulmonary COPD         Cardiovascular hypertension, + CAD Rhythm:regular Rate:Normal     Neuro/Psych  Headaches,    GI/Hepatic GERD-  ,  Endo/Other  diabetes, Type 2, Oral Hypoglycemic Agents  Renal/GU ESRF, Dialysis and Renal InsufficiencyRenal disease     Musculoskeletal   Abdominal   Peds  Hematology   Anesthesia Other Findings   Reproductive/Obstetrics                           Anesthesia Physical Anesthesia Plan  ASA: III  Anesthesia Plan: General   Post-op Pain Management:    Induction: Intravenous  Airway Management Planned: Oral ETT  Additional Equipment:   Intra-op Plan:   Post-operative Plan: Extubation in OR  Informed Consent: I have reviewed the patients History and Physical, chart, labs and discussed the procedure including the risks, benefits and alternatives for the proposed anesthesia with the patient or authorized representative who has indicated his/her understanding and acceptance.     Plan Discussed with: CRNA, Anesthesiologist and Surgeon  Anesthesia Plan Comments:         Anesthesia Quick Evaluation

## 2012-11-07 NOTE — Preoperative (Signed)
Beta Blockers   Reason not to administer Beta Blockers:B blocker taken @0830  11/07/12

## 2012-11-07 NOTE — Transfer of Care (Signed)
Immediate Anesthesia Transfer of Care Note  Patient: Annette Roach  Procedure(s) Performed: Procedure(s) with comments: ARTERIOVENOUS (AV) FISTULA CREATION (Right) - Bascilic Vein Fistula  Patient Location: PACU  Anesthesia Type:General  Level of Consciousness: awake, alert  and oriented  Airway & Oxygen Therapy: Patient Spontanous Breathing and Patient connected to nasal cannula oxygen  Post-op Assessment: Report given to PACU RN, Post -op Vital signs reviewed and stable and Patient moving all extremities  Post vital signs: Reviewed and stable  Complications: No apparent anesthesia complications

## 2012-11-07 NOTE — Progress Notes (Signed)
Tried 3 times to fax dialysis access record to [facility] will not accept fax keeps saying busy fax will send home with pt to take to dialysis tomorrow

## 2012-11-08 ENCOUNTER — Telehealth: Payer: Self-pay | Admitting: Vascular Surgery

## 2012-11-08 NOTE — Telephone Encounter (Signed)
lvm for pt, sent letter - kf °

## 2012-11-08 NOTE — Telephone Encounter (Signed)
Message copied by Margaretmary Eddy on Tue Nov 08, 2012 11:52 AM ------      Message from: Lorin Mercy K      Created: Mon Nov 07, 2012  1:34 PM      Regarding: schedule                   ----- Message -----         From: Dara Lords, PA-C         Sent: 11/07/2012  12:26 PM           To: Sharee Pimple, CMA            S/p 1st stage BVT by CEF 11/07/12.  F/u with him in 4 weeks with duplex of AVF.            Thanks,      Lelon Mast ------

## 2012-11-09 ENCOUNTER — Encounter (HOSPITAL_COMMUNITY): Payer: Self-pay | Admitting: Vascular Surgery

## 2012-12-07 ENCOUNTER — Encounter: Payer: Self-pay | Admitting: Vascular Surgery

## 2012-12-08 ENCOUNTER — Encounter: Payer: Self-pay | Admitting: Vascular Surgery

## 2012-12-08 ENCOUNTER — Ambulatory Visit (INDEPENDENT_AMBULATORY_CARE_PROVIDER_SITE_OTHER): Payer: Medicare Other | Admitting: Vascular Surgery

## 2012-12-08 ENCOUNTER — Encounter (INDEPENDENT_AMBULATORY_CARE_PROVIDER_SITE_OTHER): Payer: Medicare Other | Admitting: *Deleted

## 2012-12-08 VITALS — BP 160/56 | HR 89 | Ht 62.0 in | Wt 156.7 lb

## 2012-12-08 DIAGNOSIS — N186 End stage renal disease: Secondary | ICD-10-CM

## 2012-12-08 DIAGNOSIS — Z4931 Encounter for adequacy testing for hemodialysis: Secondary | ICD-10-CM

## 2012-12-08 DIAGNOSIS — T82897A Other specified complication of cardiac prosthetic devices, implants and grafts, initial encounter: Secondary | ICD-10-CM

## 2012-12-08 NOTE — Progress Notes (Signed)
Patient is an 79 year old female returns for followup today. She underwent right basilic vein transposition first stage on March 10. She denies any numbness or tingling in her hand. She is currently dialyzing via a catheter.  Physical exam: Filed Vitals:   12/08/12 1042  BP: 160/56  Pulse: 89  Height: 5\' 2"  (1.575 m)  Weight: 156 lb 11.2 oz (71.079 kg)  SpO2: 100%   Right upper extremity: Well-healed right antecubital incision. Audible bruit over fistula.  Assessment: Patent right basilic vein fistula.  Plan: Duplex of fistula in 6 weeks. If it seems to be developing we will consider second stage superficialization. However if the fistula remained small we will schedule her for a right upper arm AV graft.  Fabienne Bruns, MD [facility] Office: [phone] Pager: [phone]

## 2012-12-08 NOTE — Addendum Note (Signed)
Addended by: Adria Dill L on: 12/08/2012 03:04 PM   Modules accepted: Orders

## 2013-01-19 ENCOUNTER — Ambulatory Visit: Payer: Medicare Other | Admitting: Vascular Surgery

## 2013-01-25 ENCOUNTER — Encounter: Payer: Self-pay | Admitting: Vascular Surgery

## 2013-01-26 ENCOUNTER — Encounter (INDEPENDENT_AMBULATORY_CARE_PROVIDER_SITE_OTHER): Payer: Medicare Other | Admitting: *Deleted

## 2013-01-26 ENCOUNTER — Ambulatory Visit (INDEPENDENT_AMBULATORY_CARE_PROVIDER_SITE_OTHER): Payer: Medicare Other | Admitting: Vascular Surgery

## 2013-01-26 ENCOUNTER — Encounter: Payer: Self-pay | Admitting: Vascular Surgery

## 2013-01-26 VITALS — BP 138/49 | HR 84 | Ht 62.0 in | Wt 162.1 lb

## 2013-01-26 DIAGNOSIS — Z48812 Encounter for surgical aftercare following surgery on the circulatory system: Secondary | ICD-10-CM

## 2013-01-26 DIAGNOSIS — N186 End stage renal disease: Secondary | ICD-10-CM

## 2013-01-26 NOTE — Progress Notes (Signed)
Patient is an 79 year old female who is status post first stage basilic vein transposition in 11/07/2012. She returns today for further followup. She is currently dialyzing via a catheter. Her daughter states that she has had some problems with catheter that the dialysis takes a lengthy period of time and she occasionally become sick after dialysis. The patient previously had a steel in the left hand. She does have some symptoms of numbness tingling and aching in the right hand. She states these are slightly worse over the last few weeks but not debilitating. They are not as bad as the symptoms she had in her left hand.  Physical exam: Filed Vitals:   01/26/13 0930  BP: 138/49  Pulse: 84  Height: 5\' 2"  (1.575 m)  Weight: 162 lb 1.6 oz (73.528 kg)  SpO2: 97%   Right upper extremity: Palpable thrill in fistula, right hand pink warm with brisk capillary refill  Data: Patient had a duplex ultrasound of her AV fistula today. The fistula diameter overall is still fairly small approximately 4 mm.  Assessment: Marginal first stage basilic vein transposition fistula with only 4 mm diameter vein.  I discussed with the patient and her daughter today that I doubt this fistula is going to get much larger since it is not up to at least 6 mm and we are now 10 weeks out. I discussed with him the possibility of converting this to an AV graft. At this point they wished to give the fistula one more month to see if it will develop.  Plan: The patient will return for followup in one month with a duplex ultrasound. If the fistula is less than 6 mm in diameter at that point. We will consider converting it to a forearm AV graft. I also discussed with patient today her signs and symptoms of steal. I wouldn't consider these to be mild and moderate in nature. With placement of AV graft these could certainly get worse. However with all the problems she is having with her catheter I believe a long-term dialysis access is going  to be a better option for her.  Fabienne Bruns, MD [facility] Office: [phone] Pager: [phone]

## 2013-01-26 NOTE — Addendum Note (Signed)
Addended by: Adria Dill L on: 01/26/2013 02:33 PM   Modules accepted: Orders

## 2013-02-08 ENCOUNTER — Encounter (HOSPITAL_BASED_OUTPATIENT_CLINIC_OR_DEPARTMENT_OTHER): Payer: Self-pay | Admitting: *Deleted

## 2013-02-08 NOTE — Progress Notes (Signed)
Pt has a home health aid-she and pt went over hx-and daughter also reviewed preop instructions-aide wrote down instructions-she has a new av shunt rt upper are-old lt upper arm shunt taken out-also has a temp shunt rt neck-chest. Will need istat-dialysis pt

## 2013-02-09 ENCOUNTER — Other Ambulatory Visit: Payer: Self-pay | Admitting: Orthopedic Surgery

## 2013-02-10 ENCOUNTER — Encounter (HOSPITAL_BASED_OUTPATIENT_CLINIC_OR_DEPARTMENT_OTHER)
Admission: RE | Disposition: A | Discharge status: Home / Self Care | Payer: Self-pay | Source: Ambulatory Visit | Attending: Orthopedic Surgery

## 2013-02-10 ENCOUNTER — Encounter (HOSPITAL_BASED_OUTPATIENT_CLINIC_OR_DEPARTMENT_OTHER): Payer: Self-pay

## 2013-02-10 ENCOUNTER — Encounter (HOSPITAL_BASED_OUTPATIENT_CLINIC_OR_DEPARTMENT_OTHER): Payer: Self-pay | Admitting: Anesthesiology

## 2013-02-10 ENCOUNTER — Ambulatory Visit (HOSPITAL_BASED_OUTPATIENT_CLINIC_OR_DEPARTMENT_OTHER): Payer: Medicare Other | Admitting: Anesthesiology

## 2013-02-10 ENCOUNTER — Ambulatory Visit (HOSPITAL_BASED_OUTPATIENT_CLINIC_OR_DEPARTMENT_OTHER)
Admission: RE | Admit: 2013-02-10 | Discharge: 2013-02-10 | Disposition: A | Discharge status: Home / Self Care | Payer: Medicare Other | Source: Ambulatory Visit | Attending: Orthopedic Surgery | Admitting: Orthopedic Surgery

## 2013-02-10 DIAGNOSIS — J449 Chronic obstructive pulmonary disease, unspecified: Secondary | ICD-10-CM | POA: Insufficient documentation

## 2013-02-10 DIAGNOSIS — E785 Hyperlipidemia, unspecified: Secondary | ICD-10-CM | POA: Insufficient documentation

## 2013-02-10 DIAGNOSIS — E213 Hyperparathyroidism, unspecified: Secondary | ICD-10-CM | POA: Insufficient documentation

## 2013-02-10 DIAGNOSIS — J438 Other emphysema: Secondary | ICD-10-CM | POA: Insufficient documentation

## 2013-02-10 DIAGNOSIS — N186 End stage renal disease: Secondary | ICD-10-CM | POA: Insufficient documentation

## 2013-02-10 DIAGNOSIS — D649 Anemia, unspecified: Secondary | ICD-10-CM | POA: Insufficient documentation

## 2013-02-10 DIAGNOSIS — M1990 Unspecified osteoarthritis, unspecified site: Secondary | ICD-10-CM | POA: Insufficient documentation

## 2013-02-10 DIAGNOSIS — J4489 Other specified chronic obstructive pulmonary disease: Secondary | ICD-10-CM | POA: Insufficient documentation

## 2013-02-10 DIAGNOSIS — Z992 Dependence on renal dialysis: Secondary | ICD-10-CM | POA: Insufficient documentation

## 2013-02-10 DIAGNOSIS — F3289 Other specified depressive episodes: Secondary | ICD-10-CM | POA: Insufficient documentation

## 2013-02-10 DIAGNOSIS — F329 Major depressive disorder, single episode, unspecified: Secondary | ICD-10-CM | POA: Insufficient documentation

## 2013-02-10 DIAGNOSIS — G5600 Carpal tunnel syndrome, unspecified upper limb: Secondary | ICD-10-CM | POA: Insufficient documentation

## 2013-02-10 DIAGNOSIS — M129 Arthropathy, unspecified: Secondary | ICD-10-CM | POA: Insufficient documentation

## 2013-02-10 DIAGNOSIS — E119 Type 2 diabetes mellitus without complications: Secondary | ICD-10-CM | POA: Insufficient documentation

## 2013-02-10 DIAGNOSIS — I120 Hypertensive chronic kidney disease with stage 5 chronic kidney disease or end stage renal disease: Secondary | ICD-10-CM | POA: Insufficient documentation

## 2013-02-10 DIAGNOSIS — K219 Gastro-esophageal reflux disease without esophagitis: Secondary | ICD-10-CM | POA: Insufficient documentation

## 2013-02-10 DIAGNOSIS — I2510 Atherosclerotic heart disease of native coronary artery without angina pectoris: Secondary | ICD-10-CM | POA: Insufficient documentation

## 2013-02-10 DIAGNOSIS — G609 Hereditary and idiopathic neuropathy, unspecified: Secondary | ICD-10-CM | POA: Insufficient documentation

## 2013-02-10 DIAGNOSIS — I739 Peripheral vascular disease, unspecified: Secondary | ICD-10-CM | POA: Insufficient documentation

## 2013-02-10 HISTORY — DX: Presence of external hearing-aid: Z97.4

## 2013-02-10 HISTORY — DX: Presence of spectacles and contact lenses: Z97.3

## 2013-02-10 HISTORY — PX: CARPAL TUNNEL RELEASE: SHX101

## 2013-02-10 HISTORY — DX: Dependence on renal dialysis: Z99.2

## 2013-02-10 LAB — POCT I-STAT, CHEM 8
BUN: 34 mg/dL — ABNORMAL HIGH (ref 6–23)
Chloride: 99 mEq/L (ref 96–112)
HCT: 39 % (ref 36.0–46.0)
Potassium: 5.2 mEq/L — ABNORMAL HIGH (ref 3.5–5.1)

## 2013-02-10 LAB — GLUCOSE, CAPILLARY: Glucose-Capillary: 91 mg/dL (ref 70–99)

## 2013-02-10 SURGERY — CARPAL TUNNEL RELEASE
Anesthesia: Monitor Anesthesia Care | Site: Wrist | Laterality: Left | Wound class: Clean

## 2013-02-10 MED ORDER — OXYCODONE HCL 5 MG PO TABS: 5.0000 mg | ORAL_TABLET | Freq: Once | ORAL | Status: DC | PRN

## 2013-02-10 MED ORDER — MIDAZOLAM HCL 5 MG/5ML IJ SOLN
INTRAMUSCULAR | Status: DC | PRN
  Administered 2013-02-10: 0.5 mg via INTRAVENOUS

## 2013-02-10 MED ORDER — PROPOFOL 10 MG/ML IV BOLUS
INTRAVENOUS | Status: DC | PRN
  Administered 2013-02-10: 20 mg via INTRAVENOUS
  Administered 2013-02-10: 70 mg via INTRAVENOUS

## 2013-02-10 MED ORDER — OXYCODONE-ACETAMINOPHEN 5-325 MG PO TABS: ORAL_TABLET | ORAL | Status: DC

## 2013-02-10 MED ORDER — LIDOCAINE HCL 2 % IJ SOLN
INTRAMUSCULAR | Status: DC | PRN
  Administered 2013-02-10: 5 mL

## 2013-02-10 MED ORDER — OXYCODONE HCL 5 MG/5ML PO SOLN: 5.0000 mg | Freq: Once | ORAL | Status: DC | PRN

## 2013-02-10 MED ORDER — FENTANYL CITRATE 0.05 MG/ML IJ SOLN
INTRAMUSCULAR | Status: DC | PRN
  Administered 2013-02-10: 50 ug via INTRAVENOUS

## 2013-02-10 MED ORDER — MIDAZOLAM HCL 2 MG/2ML IJ SOLN: 1.0000 mg | INTRAMUSCULAR | Status: DC | PRN

## 2013-02-10 MED ORDER — FENTANYL CITRATE 0.05 MG/ML IJ SOLN: 50.0000 ug | Freq: Once | INTRAMUSCULAR | Status: DC

## 2013-02-10 MED ORDER — CHLORHEXIDINE GLUCONATE 4 % EX LIQD: 60.0000 mL | Freq: Once | CUTANEOUS | Status: DC

## 2013-02-10 MED ORDER — SODIUM CHLORIDE 0.9 % IV SOLN
INTRAVENOUS | Status: DC
  Administered 2013-02-10: 11:00:00 via INTRAVENOUS

## 2013-02-10 MED ORDER — LACTATED RINGERS IV SOLN: INTRAVENOUS | Status: DC

## 2013-02-10 MED ORDER — ONDANSETRON HCL 4 MG/2ML IJ SOLN
INTRAMUSCULAR | Status: DC | PRN
  Administered 2013-02-10: 4 mg via INTRAVENOUS

## 2013-02-10 MED ORDER — FENTANYL CITRATE 0.05 MG/ML IJ SOLN: 25.0000 ug | INTRAMUSCULAR | Status: DC | PRN

## 2013-02-10 SURGICAL SUPPLY — 35 items
BANDAGE ADHESIVE 1X3 (GAUZE/BANDAGES/DRESSINGS) IMPLANT
BANDAGE ELASTIC 3 VELCRO ST LF (GAUZE/BANDAGES/DRESSINGS) ×2 IMPLANT
BLADE SURG 15 STRL LF DISP TIS (BLADE) ×1 IMPLANT
BLADE SURG 15 STRL SS (BLADE) ×1
BNDG ESMARK 4X9 LF (GAUZE/BANDAGES/DRESSINGS) ×2 IMPLANT
BRUSH SCRUB EZ PLAIN DRY (MISCELLANEOUS) ×2 IMPLANT
CLOTH BEACON ORANGE TIMEOUT ST (SAFETY) ×2 IMPLANT
CORDS BIPOLAR (ELECTRODE) ×2 IMPLANT
COVER MAYO STAND STRL (DRAPES) ×2 IMPLANT
COVER TABLE BACK 60X90 (DRAPES) ×2 IMPLANT
CUFF TOURNIQUET SINGLE 18IN (TOURNIQUET CUFF) ×2 IMPLANT
DECANTER SPIKE VIAL GLASS SM (MISCELLANEOUS) IMPLANT
DRAPE EXTREMITY T 121X128X90 (DRAPE) ×2 IMPLANT
DRAPE SURG 17X23 STRL (DRAPES) ×2 IMPLANT
GLOVE BIO SURGEON STRL SZ 6.5 (GLOVE) ×2 IMPLANT
GLOVE BIOGEL M STRL SZ7.5 (GLOVE) IMPLANT
GLOVE ORTHO TXT STRL SZ7.5 (GLOVE) ×2 IMPLANT
GOWN BRE IMP PREV XXLGXLNG (GOWN DISPOSABLE) ×4 IMPLANT
GOWN PREVENTION PLUS XLARGE (GOWN DISPOSABLE) ×2 IMPLANT
NEEDLE 27GAX1X1/2 (NEEDLE) ×2 IMPLANT
PACK BASIN DAY SURGERY FS (CUSTOM PROCEDURE TRAY) ×2 IMPLANT
PAD CAST 3X4 CTTN HI CHSV (CAST SUPPLIES) ×1 IMPLANT
PADDING CAST ABS 4INX4YD NS (CAST SUPPLIES) ×1
PADDING CAST ABS COTTON 4X4 ST (CAST SUPPLIES) ×1 IMPLANT
PADDING CAST COTTON 3X4 STRL (CAST SUPPLIES) ×1
SPLINT PLASTER CAST XFAST 3X15 (CAST SUPPLIES) ×5 IMPLANT
SPLINT PLASTER XTRA FASTSET 3X (CAST SUPPLIES) ×5
SPONGE GAUZE 4X4 12PLY (GAUZE/BANDAGES/DRESSINGS) ×2 IMPLANT
STOCKINETTE 4X48 STRL (DRAPES) ×2 IMPLANT
STRIP CLOSURE SKIN 1/2X4 (GAUZE/BANDAGES/DRESSINGS) ×2 IMPLANT
SUT PROLENE 3 0 PS 2 (SUTURE) ×2 IMPLANT
SYR 3ML 23GX1 SAFETY (SYRINGE) IMPLANT
SYR CONTROL 10ML LL (SYRINGE) ×2 IMPLANT
TRAY DSU PREP LF (CUSTOM PROCEDURE TRAY) ×2 IMPLANT
UNDERPAD 30X30 INCONTINENT (UNDERPADS AND DIAPERS) ×2 IMPLANT

## 2013-02-10 NOTE — Brief Op Note (Signed)
02/10/2013  1:44 PM  PATIENT:  Annette Roach  79 y.o. female  PRE-OPERATIVE DIAGNOSIS:  SEVERE LEFT CARPAL TUNNEL SYNDROME  POST-OPERATIVE DIAGNOSIS:  SEVERE LEFT CARPAL TUNNEL SYNDROME  PROCEDURE:  Procedure(s): CARPAL TUNNEL RELEASE (Left)  SURGEON:  Surgeon(s) and Role:    * Wyn Forster., MD - Primary  PHYSICIAN ASSISTANT:   ASSISTANTS: Surgical nurse  ANESTHESIA:   MAC  EBL:     BLOOD ADMINISTERED:none  DRAINS: none   LOCAL MEDICATIONS USED:  XYLOCAINE   SPECIMEN:  No Specimen  DISPOSITION OF SPECIMEN:  N/A  COUNTS:  YES  TOURNIQUET:   Total Tourniquet Time Documented: Upper Arm (Left) - 10 minutes Total: Upper Arm (Left) - 10 minutes   DICTATION: .Other Dictation: Dictation Number [id number]  PLAN OF CARE: Discharge to home after PACU  PATIENT DISPOSITION:  PACU - hemodynamically stable.   Delay start of Pharmacological VTE agent (>24hrs) due to surgical blood loss or risk of bleeding: not applicable

## 2013-02-10 NOTE — Transfer of Care (Signed)
Immediate Anesthesia Transfer of Care Note  Patient: Annette Roach  Procedure(s) Performed: Procedure(s): CARPAL TUNNEL RELEASE (Left)  Patient Location: PACU  Anesthesia Type:MAC  Level of Consciousness: awake  Airway & Oxygen Therapy: Patient Spontanous Breathing and Patient connected to face mask oxygen  Post-op Assessment: Report given to PACU RN  Post vital signs: Reviewed and stable  Complications: No apparent anesthesia complications

## 2013-02-10 NOTE — Op Note (Signed)
[id number] 

## 2013-02-10 NOTE — H&P (Signed)
Annette Roach is an 79 y.o. female.   Chief Complaint: c/o chronic and progressive numbness and tingling of the left hand HPI: . 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 She is accompanied by her daughter, Annette Roach, for evaluation of her symptoms today. She has a history of diabetes dating back 35 years, hypertension 10 years, asthma and bronchitis in her adult life. She has had end stage renal disease and on hemodialysis since September 2013. She has had 2 attempts at vascular access in the left arm which failed because of "small veins".  She has recently noted bilateral hand numbness, left worse than right. On the right she has primarily numbness in the ring and small fingers. On the left she has numbness in the thumb, index, long and ring fingers. She states her left small finger has the best feeling of all.   Past Medical History  Diagnosis Date  . COPD (chronic obstructive pulmonary disease)     emphysema  . Neuropathy   . Hyperlipidemia   . Diabetes mellitus   . Chronic kidney disease   . Depression   . Anemia   . Shortness of breath   . GERD (gastroesophageal reflux disease)   . Hyperparathyroidism   . Hypertension     sees Dr. Feliciana Rossetti  . Headache(784.0)   . Arthritis   . CAD (coronary artery disease)     sees Dr. Gypsy Balsam, [facility], Rosalita Levan  . 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 Wears glasses   . Wears hearing aid     right  . Presence of surgically created AV shunt for hemodialysis     old lt upper arm out-rt upper arm shunt in    Past Surgical History  Procedure Laterality Date  . Abdominal hysterectomy  1976  . Kidney stone surgery  2013  . Av fistula placement  01/20/2012    Procedure: ARTERIOVENOUS (AV) FISTULA CREATION;  Surgeon: Sherren Kerns, MD;  Location: [facility] OR;  Service: Vascular;  Laterality: Left;  . Hemodialysis catheter  05/25/12    secondary to failed AVF   .  Eye surgery      bilateral cataract removed  . Av fistula placement  09/05/2012    Procedure: INSERTION OF ARTERIOVENOUS (AV) GORE-TEX GRAFT ARM;  Surgeon: Sherren Kerns, MD;  Location: [facility] OR;  Service: Vascular;  Laterality: Left;  Using 4-73mm x 45 cm Vascular stretch goretex graft  . Ligation of arteriovenous  fistula  09/05/2012    Procedure: LIGATION OF ARTERIOVENOUS  FISTULA;  Surgeon: Sherren Kerns, MD;  Location: [facility] OR;  Service: Vascular;  Laterality: Left;  . Ligation arteriovenous gortex graft  09/05/2012    Procedure: LIGATION ARTERIOVENOUS GORTEX GRAFT;  Surgeon: Sherren Kerns, MD;  Location: [facility] OR;  Service: Vascular;  Laterality: Left;  . Av fistula placement Right 11/07/2012    Procedure: ARTERIOVENOUS (AV) FISTULA CREATION;  Surgeon: Sherren Kerns, MD;  Location: [facility] OR;  Service: Vascular;  Laterality: Right;  Bascilic Vein Fistula  . Appendectomy      Family History  Problem Relation Age of Onset  . Diabetes Mother   . Cancer Father   . Deep vein thrombosis Brother   . Hypertension Brother    Social History:  reports that she has never smoked. She has never used smokeless tobacco. She reports that she does not drink alcohol or use illicit  drugs.  Allergies: No Known Allergies  No prescriptions prior to admission    No results found for this or any previous visit (from the past 48 hour(s)).  No results found.   Pertinent items are noted in HPI.  Height 5\' 2"  (1.575 m), weight 73.483 kg (162 lb).  General appearance: alert Head: Normocephalic, without obvious abnormality Neck: supple, symmetrical, trachea midline Resp: clear to auscultation bilaterally Cardio: regular rate and rhythm GI: normal findings: bowel sounds normal Extremities:  Inspection of her hands reveals minimal stigmata of osteoarthritis. She has mild thenar atrophy on the left. She has diminished sensibility in the median distribution on the left and in the ulnar distribution on the  right. She has a markedly positive Tinel's sign bilaterally. She has bounding radial pulses and palpable ulnar pulses. She does not appear to have an obvious Steel syndrome but could have microvascular disease.  She has no sign of stenosing tenosynovitis at this time. She has well healed surgical scars.   X-rays of her hands document calcification of her radial artery and on the left a vascular clip noted near the radial artery. There is degenerative change at all of her IP joints and osteopenia consistent with her end stage renal disease. There is degenerative change at the distal radial ulnar joint bilaterally and is ulnar neutral on the right and slightly positive on the left.  Dr. Johna Roles completed detailed electrodiagnostic studies today which revealed a mixed sensory motor polyneuropathy consistent with diabetes mellitus. She appears to have a superimposed carpal tunnel syndrome with profound loss of her sensory amplitudes and prolongation of her latencies across her wrist.  Pulses: 2+ and symmetric Skin: normal Neurologic: Grossly normal     Assessment/Plan Impression: Left CTS with very severe DM, chronic hemodialysis and severe thenar atrophy.  Plan: To the OR for left CTR.The procedure, risks,benefits and post-op course were discussed with the patient at length and they were in agreement with the plan. A minimum og six moths recovery is estimated.  Recovery will be partial at best give the complex constellation of disease.  Annette Roach 02/10/2013, 8:34 AM H&P documentation: 02/10/2013  -History and Physical Reviewed  -Patient has been re-examined  -No change in the plan of care  Wyn Forster, MD

## 2013-02-10 NOTE — Anesthesia Preprocedure Evaluation (Addendum)
Anesthesia Evaluation  Patient identified by MRN, date of birth, ID band Patient awake    Reviewed: Allergy & Precautions, H&P , NPO status , Patient's Chart, lab work & pertinent test results  Airway Mallampati: I TM Distance: >3 FB Neck ROM: full    Dental   Pulmonary COPD breath sounds clear to auscultation        Cardiovascular hypertension, + CAD and + Peripheral Vascular Disease Rhythm:regular Rate:Normal     Neuro/Psych  Headaches,    GI/Hepatic GERD-  ,  Endo/Other  diabetes, Type 2, Oral Hypoglycemic Agents  Renal/GU ESRF, Dialysis and Renal InsufficiencyRenal disease     Musculoskeletal   Abdominal   Peds  Hematology   Anesthesia Other Findings   Reproductive/Obstetrics                           Anesthesia Physical Anesthesia Plan  ASA: III  Anesthesia Plan: General   Post-op Pain Management:    Induction: Intravenous  Airway Management Planned: LMA  Additional Equipment:   Intra-op Plan:   Post-operative Plan:   Informed Consent: I have reviewed the patients History and Physical, chart, labs and discussed the procedure including the risks, benefits and alternatives for the proposed anesthesia with the patient or authorized representative who has indicated his/her understanding and acceptance.     Plan Discussed with: CRNA and Surgeon  Anesthesia Plan Comments:        Anesthesia Quick Evaluation

## 2013-02-10 NOTE — Anesthesia Postprocedure Evaluation (Signed)
   Anesthesia Post-op Note  Patient: Annette Roach  Procedure(s) Performed: Procedure(s): CARPAL TUNNEL RELEASE (Left)  Patient Location: PACU  Anesthesia Type:General  Level of Consciousness: awake and alert   Airway and Oxygen Therapy: Patient Spontanous Breathing  Post-op Pain: mild  Post-op Assessment: Post-op Vital signs reviewed, Patient's Cardiovascular Status Stable, Respiratory Function Stable, Patent Airway, No signs of Nausea or vomiting and Pain level controlled  Post-op Vital Signs: stable  Complications: No apparent anesthesia complications

## 2013-02-13 ENCOUNTER — Encounter (HOSPITAL_BASED_OUTPATIENT_CLINIC_OR_DEPARTMENT_OTHER): Payer: Self-pay | Admitting: Orthopedic Surgery

## 2013-02-13 NOTE — Progress Notes (Signed)
Pt c/o feeling "stumbly". Has taken oxycodone for pain. Pt on numerous BP meds.Saw cardiologist today for routine follow-up. Blood sugar today was 123.  Encouraged pt to call Dr. Teressa Senter for different pain med if she feels the oxycodone is making her feel worse.

## 2013-02-13 NOTE — Op Note (Signed)
NAME:  Annette Roach, Annette Roach               ACCOUNT NO.:  [id number]  MEDICAL RECORD NO.:  [id number]  LOCATION:                               FACILITY:  [facility]  PHYSICIAN:  Katy Fitch. Robet Crutchfield, M.D. DATE OF BIRTH:  [birth year]  DATE OF PROCEDURE:  02/10/2013 DATE OF DISCHARGE:  02/10/2013                              OPERATIVE REPORT   PREOPERATIVE DIAGNOSIS:  Severe left carpal tunnel syndrome with background medical predicaments of diabetes mellitus, hypertension, chronic renal failure, and hemodialysis.  INDICATIONS:  [name] was noted have significant preoperative thenar atrophy due to her profound neuropathy.  We provided detailed informed consent in the office pointing out to her that with her multiple medical problems and severe carpal tunnel syndrome, we could not promise that she would have recovery of her thenar musculature and based on prior research it is well known in the individuals of her age and medical predicament.  We will only have partial recovery of the median nerve function.  The goal of surgery in this setting is to relieve her of night pain, and try to improve some useful sensibility to her hand.  Preoperatively, she was provided detailed informed consent in the office and also in the holding area with her daughter present.  After informed consent, she is brought to the operating room at this time.  Preoperatively, she was interviewed by Dr. Gypsy Balsam.  General anesthesia was ruled out and local anesthesia with sedation recommended and accepted.  PROCEDURE IN DETAIL:  [name] was brought to room #8 of the [facility] and placed in supine position on the operating table.  Following a detailed anesthesia informed consent, IV sedation was provided under Dr. Gypsy Balsam supervision.  The left arm and hand were prepped with Betadine soap and solution, sterilely draped.  A 2% lidocaine was infiltrated in the path of the intended incision and around the median  nerve in the distal forearm.  After 5 minutes, excellent anesthesia was achieved.  The left hand and arm were then exsanguinated with Esmarch bandage and an arterial tourniquet on the proximal brachium was inflated 250 mmHg due to systolic hypertension.  Procedure commenced with routine surgical time-out.  An incision was fashioned paralleling the thenar crease in line of the ring finger. Subcutaneous tissues were carefully divided revealing the palmar fascia.  This was split longitudinally to reveal the common sensory branch of the median nerve and the superficial palmar arch.  Care was taken to sound the middle palmar space.  This was filled with adipose tissue.  A Penfield 4 elevator was used to sound the distal margin of the transverse carpal ligament and create a pathway superficial to the nerve and tendons.  The transverse carpal ligament was released subcutaneously along its ulnar border extending into the distal forearm.  A release of the volar forearm fascia was accomplished 6 cm above the distal wrist flexion crease.  Bleeding points along the margin of the released ligament were electrocauterized with bipolar current.  The wound was repaired with intradermal 3-0 Prolene and Steri-Strips.  The wound was dressed with sterile gauze, sterile Webril, and a volar plaster splint maintaining the wrist and 15 degrees dorsiflexion.  For aftercare, [name] is provided a prescription for Percocet 5 mg 1 tablet p.o. q.6 hours p.r.n. pain.  She will have hemodialysis on Saturday morning, February 11, 2013.  Her potassium, creatinine, and BUN were examined today and found to be within acceptable limits.  Questions regarding her aftercare were discussed with her daughter in detail in the postoperative conference.  We will provide written instructions and see her back for followup in 1 week for suture removal, and advancement to an exercise program.     Katy Fitch. Riya Huxford,  M.D.     RVS/MEDQ  D:  02/10/2013  T:  02/11/2013  Job:  [id number]

## 2013-03-08 ENCOUNTER — Encounter: Payer: Self-pay | Admitting: Vascular Surgery

## 2013-03-09 ENCOUNTER — Encounter (INDEPENDENT_AMBULATORY_CARE_PROVIDER_SITE_OTHER): Payer: Medicare Other | Admitting: *Deleted

## 2013-03-09 ENCOUNTER — Other Ambulatory Visit: Payer: Self-pay

## 2013-03-09 ENCOUNTER — Encounter: Payer: Self-pay | Admitting: Vascular Surgery

## 2013-03-09 ENCOUNTER — Ambulatory Visit (INDEPENDENT_AMBULATORY_CARE_PROVIDER_SITE_OTHER): Payer: Medicare Other | Admitting: Vascular Surgery

## 2013-03-09 VITALS — BP 118/46 | HR 82 | Ht 62.0 in | Wt 163.0 lb

## 2013-03-09 DIAGNOSIS — N186 End stage renal disease: Secondary | ICD-10-CM

## 2013-03-09 DIAGNOSIS — T82598A Other mechanical complication of other cardiac and vascular devices and implants, initial encounter: Secondary | ICD-10-CM

## 2013-03-09 DIAGNOSIS — Z4931 Encounter for adequacy testing for hemodialysis: Secondary | ICD-10-CM

## 2013-03-09 DIAGNOSIS — Z48812 Encounter for surgical aftercare following surgery on the circulatory system: Secondary | ICD-10-CM

## 2013-03-09 NOTE — Progress Notes (Signed)
Patient is an 79-year-old female who is status post first stage basilic vein transposition in her right arm approximately one month ago. She returns for followup today. She denies any numbness or tingling in her right hand suggestive of steal. She is currently dialyzing via a temporary catheter. She dialyzes on Tuesday Thursday and Saturday.  Current Outpatient Prescriptions on File Prior to Visit  Medication Sig Dispense Refill  . acetaminophen (TYLENOL) 325 MG tablet Take 650 mg by mouth daily as needed. For pain      . albuterol (PROVENTIL HFA;VENTOLIN HFA) 108 (90 BASE) MCG/ACT inhaler Inhale 2 puffs into the lungs every 6 (six) hours as needed. For shortness of breath      . aspirin EC 81 MG tablet Take 81 mg by mouth daily.      . azelastine (ASTELIN) 137 MCG/SPRAY nasal spray Place 1 spray into the nose daily. Use in each nostril as directed      . b complex-vitamin c-folic acid (NEPHRO-VITE) 0.8 MG TABS Take 0.8 mg by mouth daily.       . calcium acetate (PHOSLO) 667 MG capsule Take 667-1,334 mg by mouth See admin instructions. Take 2 capsules with meals three times a day and one capsule with snacks as needed      . carvedilol (COREG) 12.5 MG tablet Take 12.5 mg by mouth 2 (two) times daily with a meal.      . esomeprazole (NEXIUM) 40 MG capsule Take 40 mg by mouth daily.       . gabapentin (NEURONTIN) 300 MG capsule Take 300 mg by mouth at bedtime.       . hydrALAZINE (APRESOLINE) 100 MG tablet Take 100 mg by mouth 2 (two) times daily.      . isosorbide mononitrate (IMDUR) 60 MG 24 hr tablet Take 180 mg by mouth daily.      . minoxidil (LONITEN) 2.5 MG tablet Take 2.5 mg by mouth daily.       . NIFEdipine (PROCARDIA-XL/ADALAT-CC/NIFEDICAL-XL) 30 MG 24 hr tablet Take 90 mg by mouth daily.      . nitroGLYCERIN (NITROSTAT) 0.4 MG SL tablet Place 0.4 mg under the tongue every 5 (five) minutes as needed for chest pain.      . oxyCODONE (ROXICODONE) 5 MG immediate release tablet Take 1 tablet (5  mg total) by mouth every 6 (six) hours as needed for pain.  30 tablet  0  . oxyCODONE-acetaminophen (PERCOCET/ROXICET) 5-325 MG per tablet Take one or 2 tablets every 4-6 hours as needed for postoperative pain  20 tablet  0  . simvastatin (ZOCOR) 5 MG tablet Take 5 mg by mouth at bedtime.       . sitaGLIPtin (JANUVIA) 25 MG tablet Take 25 mg by mouth daily.      . Wheat Dextrin (BENEFIBER PO) Take 1 scoop by mouth daily.       . cloNIDine (CATAPRES) 0.1 MG tablet Take 0.1 mg by mouth 2 (two) times daily.       No current facility-administered medications on file prior to visit.   Physical exam: Filed Vitals:   03/09/13 0857  BP: 118/46  Pulse: 82  Height: 5' 2" (1.575 m)  Weight: 163 lb (73.936 kg)  SpO2: 99%   Right upper extremity: Well-healed antecubital incision audible bruit in the fistula absent right radial pulse  Data: Duplex ultrasound of the right basilic vein fistula was performed today. Throughout most of the course of the fistula it is 5-6 mm in diameter.   There was a suggestion of possible narrowing at the proximal aspect of the fistula but this may just be size caliber mismatch.  Assessment: Maturing right basilic vein transposition fistula.  Plan: Second stage basilic vein transposition Wednesday, July 16. Risks benefits possible complications and procedure details were explained the patient and her daughter today. These include but are not limited to bleeding infection wound problems non-maturation of fistula. They agree to proceed.  Lonn Im, MD [facility] Office: [phone] Pager: [phone]  

## 2013-03-13 ENCOUNTER — Encounter (HOSPITAL_COMMUNITY): Payer: Self-pay | Admitting: Respiratory Therapy

## 2013-03-14 ENCOUNTER — Encounter (HOSPITAL_COMMUNITY): Payer: Self-pay | Admitting: *Deleted

## 2013-03-14 MED ORDER — CEFUROXIME SODIUM 1.5 G IJ SOLR
1.5000 g | INTRAMUSCULAR | Status: AC
  Administered 2013-03-15: 1.5 g via INTRAVENOUS
  Filled 2013-03-14: qty 1.5

## 2013-03-14 NOTE — Progress Notes (Signed)
03/14/13 1731  OBSTRUCTIVE SLEEP APNEA  Have you ever been diagnosed with sleep apnea through a sleep study? No  Do you snore loudly (loud enough to be heard through closed doors)?  1  Do you often feel tired, fatigued, or sleepy during the daytime? 1  Has anyone observed you stop breathing during your sleep? 0  Do you have, or are you being treated for high blood pressure? 1  BMI more than 35 kg/m2? 0  Age over 77 years old? 1  Gender: 0  Obstructive Sleep Apnea Score 4  Score 4 or greater  Results sent to PCP

## 2013-03-15 ENCOUNTER — Ambulatory Visit (HOSPITAL_COMMUNITY): Payer: Medicare Other

## 2013-03-15 ENCOUNTER — Ambulatory Visit (HOSPITAL_COMMUNITY): Payer: Medicare Other | Admitting: Certified Registered Nurse Anesthetist

## 2013-03-15 ENCOUNTER — Encounter (HOSPITAL_COMMUNITY): Payer: Self-pay | Admitting: Certified Registered Nurse Anesthetist

## 2013-03-15 ENCOUNTER — Ambulatory Visit (HOSPITAL_COMMUNITY)
Admission: RE | Admit: 2013-03-15 | Discharge: 2013-03-15 | Disposition: A | Discharge status: Home / Self Care | Payer: Medicare Other | Source: Ambulatory Visit | Attending: Vascular Surgery | Admitting: Vascular Surgery

## 2013-03-15 ENCOUNTER — Other Ambulatory Visit: Payer: Self-pay | Admitting: *Deleted

## 2013-03-15 ENCOUNTER — Encounter (HOSPITAL_COMMUNITY)
Admission: RE | Disposition: A | Discharge status: Home / Self Care | Payer: Self-pay | Source: Ambulatory Visit | Attending: Vascular Surgery

## 2013-03-15 ENCOUNTER — Telehealth: Payer: Self-pay | Admitting: Vascular Surgery

## 2013-03-15 DIAGNOSIS — Z79899 Other long term (current) drug therapy: Secondary | ICD-10-CM | POA: Insufficient documentation

## 2013-03-15 DIAGNOSIS — I739 Peripheral vascular disease, unspecified: Secondary | ICD-10-CM | POA: Insufficient documentation

## 2013-03-15 DIAGNOSIS — F3289 Other specified depressive episodes: Secondary | ICD-10-CM | POA: Insufficient documentation

## 2013-03-15 DIAGNOSIS — K219 Gastro-esophageal reflux disease without esophagitis: Secondary | ICD-10-CM | POA: Insufficient documentation

## 2013-03-15 DIAGNOSIS — J4489 Other specified chronic obstructive pulmonary disease: Secondary | ICD-10-CM | POA: Insufficient documentation

## 2013-03-15 DIAGNOSIS — N186 End stage renal disease: Secondary | ICD-10-CM | POA: Insufficient documentation

## 2013-03-15 DIAGNOSIS — E119 Type 2 diabetes mellitus without complications: Secondary | ICD-10-CM | POA: Insufficient documentation

## 2013-03-15 DIAGNOSIS — Z4931 Encounter for adequacy testing for hemodialysis: Secondary | ICD-10-CM

## 2013-03-15 DIAGNOSIS — Z7982 Long term (current) use of aspirin: Secondary | ICD-10-CM | POA: Insufficient documentation

## 2013-03-15 DIAGNOSIS — F329 Major depressive disorder, single episode, unspecified: Secondary | ICD-10-CM | POA: Insufficient documentation

## 2013-03-15 DIAGNOSIS — M1990 Unspecified osteoarthritis, unspecified site: Secondary | ICD-10-CM | POA: Insufficient documentation

## 2013-03-15 DIAGNOSIS — I2510 Atherosclerotic heart disease of native coronary artery without angina pectoris: Secondary | ICD-10-CM | POA: Insufficient documentation

## 2013-03-15 DIAGNOSIS — Z992 Dependence on renal dialysis: Secondary | ICD-10-CM | POA: Insufficient documentation

## 2013-03-15 DIAGNOSIS — J449 Chronic obstructive pulmonary disease, unspecified: Secondary | ICD-10-CM | POA: Insufficient documentation

## 2013-03-15 DIAGNOSIS — I120 Hypertensive chronic kidney disease with stage 5 chronic kidney disease or end stage renal disease: Secondary | ICD-10-CM | POA: Insufficient documentation

## 2013-03-15 HISTORY — DX: Personal history of urinary calculi: Z87.442

## 2013-03-15 HISTORY — DX: Angina pectoris, unspecified: I20.9

## 2013-03-15 HISTORY — DX: Personal history of other medical treatment: Z92.89

## 2013-03-15 HISTORY — DX: Constipation, unspecified: K59.00

## 2013-03-15 HISTORY — PX: BASCILIC VEIN TRANSPOSITION: SHX5742

## 2013-03-15 LAB — POCT I-STAT 4, (NA,K, GLUC, HGB,HCT)
Glucose, Bld: 119 mg/dL — ABNORMAL HIGH (ref 70–99)
HCT: 38 % (ref 36.0–46.0)
Hemoglobin: 12.9 g/dL (ref 12.0–15.0)
Potassium: 4.7 meq/L (ref 3.5–5.1)
Sodium: 139 meq/L (ref 135–145)

## 2013-03-15 LAB — GLUCOSE, CAPILLARY
Glucose-Capillary: 118 mg/dL — ABNORMAL HIGH (ref 70–99)
Glucose-Capillary: 87 mg/dL (ref 70–99)
Glucose-Capillary: 92 mg/dL (ref 70–99)

## 2013-03-15 SURGERY — TRANSPOSITION, VEIN, BASILIC
Anesthesia: General | Site: Arm Upper | Laterality: Right | Wound class: Clean

## 2013-03-15 MED ORDER — 0.9 % SODIUM CHLORIDE (POUR BTL) OPTIME
TOPICAL | Status: DC | PRN
  Administered 2013-03-15: 1000 mL

## 2013-03-15 MED ORDER — SODIUM CHLORIDE 0.9 % IV SOLN
10.0000 mg | INTRAVENOUS | Status: DC | PRN
  Administered 2013-03-15: 50 ug/min via INTRAVENOUS

## 2013-03-15 MED ORDER — ONDANSETRON HCL 4 MG/2ML IJ SOLN
INTRAMUSCULAR | Status: DC | PRN
  Administered 2013-03-15: 4 mg via INTRAVENOUS

## 2013-03-15 MED ORDER — ONDANSETRON HCL 4 MG/2ML IJ SOLN: 4.0000 mg | Freq: Once | INTRAMUSCULAR | Status: DC | PRN

## 2013-03-15 MED ORDER — LIDOCAINE HCL (CARDIAC) 20 MG/ML IV SOLN
INTRAVENOUS | Status: DC | PRN
  Administered 2013-03-15: 80 mg via INTRAVENOUS

## 2013-03-15 MED ORDER — PHENYLEPHRINE HCL 10 MG/ML IJ SOLN
INTRAMUSCULAR | Status: DC | PRN
  Administered 2013-03-15 (×4): 80 ug via INTRAVENOUS
  Administered 2013-03-15: 40 ug via INTRAVENOUS
  Administered 2013-03-15: 80 ug via INTRAVENOUS

## 2013-03-15 MED ORDER — OXYCODONE HCL 5 MG PO TABS: 5.0000 mg | ORAL_TABLET | ORAL | Status: DC | PRN

## 2013-03-15 MED ORDER — OXYCODONE HCL 5 MG PO TABS
ORAL_TABLET | ORAL | Status: AC
  Administered 2013-03-15: 5 mg
  Filled 2013-03-15: qty 1

## 2013-03-15 MED ORDER — PROPOFOL 10 MG/ML IV BOLUS
INTRAVENOUS | Status: DC | PRN
  Administered 2013-03-15: 140 mg via INTRAVENOUS

## 2013-03-15 MED ORDER — SODIUM CHLORIDE 0.9 % IV SOLN
INTRAVENOUS | Status: DC
  Administered 2013-03-15: 09:00:00 via INTRAVENOUS

## 2013-03-15 MED ORDER — FENTANYL CITRATE 0.05 MG/ML IJ SOLN
INTRAMUSCULAR | Status: AC
  Filled 2013-03-15: qty 2

## 2013-03-15 MED ORDER — OXYCODONE HCL 5 MG PO TABS: 5.0000 mg | ORAL_TABLET | Freq: Four times a day (QID) | ORAL | Status: DC | PRN

## 2013-03-15 MED ORDER — THROMBIN 20000 UNITS EX SOLR
CUTANEOUS | Status: AC
  Filled 2013-03-15: qty 20000

## 2013-03-15 MED ORDER — FENTANYL CITRATE 0.05 MG/ML IJ SOLN
25.0000 ug | INTRAMUSCULAR | Status: DC | PRN
  Administered 2013-03-15 (×3): 50 ug via INTRAVENOUS

## 2013-03-15 MED ORDER — SODIUM CHLORIDE 0.9 % IR SOLN
Status: DC | PRN
  Administered 2013-03-15: 12:00:00

## 2013-03-15 MED ORDER — LIDOCAINE HCL (PF) 1 % IJ SOLN
INTRAMUSCULAR | Status: AC
  Filled 2013-03-15: qty 30

## 2013-03-15 SURGICAL SUPPLY — 38 items
CANISTER SUCTION 2500CC (MISCELLANEOUS) ×2 IMPLANT
CLIP TI MEDIUM 24 (CLIP) ×2 IMPLANT
CLIP TI WIDE RED SMALL 24 (CLIP) ×2 IMPLANT
CLOTH BEACON ORANGE TIMEOUT ST (SAFETY) ×2 IMPLANT
COVER PROBE W GEL 5X96 (DRAPES) ×2 IMPLANT
COVER SURGICAL LIGHT HANDLE (MISCELLANEOUS) ×2 IMPLANT
DECANTER SPIKE VIAL GLASS SM (MISCELLANEOUS) IMPLANT
DERMABOND ADVANCED (GAUZE/BANDAGES/DRESSINGS) ×2
DERMABOND ADVANCED .7 DNX12 (GAUZE/BANDAGES/DRESSINGS) ×2 IMPLANT
ELECT REM PT RETURN 9FT ADLT (ELECTROSURGICAL) ×2
ELECTRODE REM PT RTRN 9FT ADLT (ELECTROSURGICAL) ×1 IMPLANT
GEL ULTRASOUND 20GR AQUASONIC (MISCELLANEOUS) IMPLANT
GLOVE BIO SURGEON STRL SZ 6.5 (GLOVE) ×4 IMPLANT
GLOVE BIO SURGEON STRL SZ7.5 (GLOVE) ×4 IMPLANT
GLOVE BIOGEL PI IND STRL 6.5 (GLOVE) ×1 IMPLANT
GLOVE BIOGEL PI IND STRL 7.5 (GLOVE) ×2 IMPLANT
GLOVE BIOGEL PI INDICATOR 6.5 (GLOVE) ×1
GLOVE BIOGEL PI INDICATOR 7.5 (GLOVE) ×2
GLOVE ECLIPSE 6.5 STRL STRAW (GLOVE) ×2 IMPLANT
GOWN PREVENTION PLUS XLARGE (GOWN DISPOSABLE) ×2 IMPLANT
GOWN STRL NON-REIN LRG LVL3 (GOWN DISPOSABLE) ×6 IMPLANT
KIT BASIN OR (CUSTOM PROCEDURE TRAY) ×2 IMPLANT
KIT ROOM TURNOVER OR (KITS) ×2 IMPLANT
LOOP VESSEL MINI RED (MISCELLANEOUS) IMPLANT
NS IRRIG 1000ML POUR BTL (IV SOLUTION) ×2 IMPLANT
PACK CV ACCESS (CUSTOM PROCEDURE TRAY) ×2 IMPLANT
PAD ARMBOARD 7.5X6 YLW CONV (MISCELLANEOUS) ×4 IMPLANT
SPONGE SURGIFOAM ABS GEL 100 (HEMOSTASIS) IMPLANT
SUT PROLENE 6 0 CC (SUTURE) ×2 IMPLANT
SUT SILK 2 0 SH (SUTURE) ×2 IMPLANT
SUT VIC AB 2-0 CTX 36 (SUTURE) IMPLANT
SUT VIC AB 3-0 SH 27 (SUTURE) ×3
SUT VIC AB 3-0 SH 27X BRD (SUTURE) ×3 IMPLANT
SUT VICRYL 4-0 PS2 18IN ABS (SUTURE) ×6 IMPLANT
TOWEL OR 17X24 6PK STRL BLUE (TOWEL DISPOSABLE) ×2 IMPLANT
TOWEL OR 17X26 10 PK STRL BLUE (TOWEL DISPOSABLE) ×2 IMPLANT
UNDERPAD 30X30 INCONTINENT (UNDERPADS AND DIAPERS) ×2 IMPLANT
WATER STERILE IRR 1000ML POUR (IV SOLUTION) ×2 IMPLANT

## 2013-03-15 NOTE — Preoperative (Signed)
Beta Blockers   Reason not to administer Beta Blockers:Not Applicable, Pt took Coreg at 0530 today

## 2013-03-15 NOTE — Progress Notes (Signed)
AVF on right upper arm has 2+ radial pulse hand warm with good capillary refill auscultate a bruit

## 2013-03-15 NOTE — Progress Notes (Signed)
Dr. Darrick Penna visited pt at bedside

## 2013-03-15 NOTE — Interval H&P Note (Signed)
History and Physical Interval Note:  03/15/2013 8:26 AM  Annette Roach  has presented today for surgery, with the diagnosis of End Stage Renal Disease  The various methods of treatment have been discussed with the patient and family. After consideration of risks, benefits and other options for treatment, the patient has consented to  Procedure(s): 2ND STAGE BASCILIC VEIN TRANSPOSITION - RIGHT ARM (Right) as a surgical intervention .  The patient's history has been reviewed, patient examined, no change in status, stable for surgery.  I have reviewed the patient's chart and labs.  Questions were answered to the patient's satisfaction.     FIELDS,CHARLES E

## 2013-03-15 NOTE — Progress Notes (Signed)
I v service came and saline locked blue port of hemodilaysis catheter

## 2013-03-15 NOTE — Progress Notes (Signed)
Pt still has 2+ right radial pulse and auscultate bruit And palpate thrill

## 2013-03-15 NOTE — Anesthesia Procedure Notes (Signed)
Procedure Name: LMA Insertion Date/Time: 03/15/2013 12:00 PM Performed by: Orvilla Fus A Pre-anesthesia Checklist: Patient identified, Emergency Drugs available, Suction available, Patient being monitored and Timeout performed Patient Re-evaluated:Patient Re-evaluated prior to inductionOxygen Delivery Method: Circle system utilized Preoxygenation: Pre-oxygenation with 100% oxygen Intubation Type: IV induction Ventilation: Mask ventilation without difficulty LMA: LMA inserted LMA Size: 4.0 Number of attempts: 1 Placement Confirmation: positive ETCO2 and CO2 detector Tube secured with: Tape Dental Injury: Teeth and Oropharynx as per pre-operative assessment

## 2013-03-15 NOTE — Op Note (Signed)
Procedure: 2nd stage basilic vein transposition fistula Preop: ESRD Postop: ESRD Anesthesia: General Asst: Samantha Rhyne PAC Findings 5-6  Mm fistula  Operative details: After obtaining informed consent, the patient was taken to the operating room. The patient was placed in supine position on the operating room table. After administration of general anesthesia, the patient's entire right upper extremity was prepped and draped in usual sterile fashion. A longitudinal incision was made in the right antecubital area. Incision was carried down through the subcutaneous tissues down to the level of the preexisting basilic to brachial artery fistula. The vein was approximately 5-6 mm in diameter. The fistula was dissected free circumferentially and side branches ligated and divided between silk ties.  Two additional longitudinal incisions were made to dissect out the vein to the level of the axilla.  Several additional branches were ligated.  The more distal portion of the fistula was tethered below the median nerve.  I thought that we had enough length of vein at this point and I did not want to divide the fistula to avoid the nerve so this was left in place.  The remainder of the branches were smaller and ligated and divided between clips or ties. After the fistula was completely mobilized several 3 0 Vicryl sutures were placed posterior to the vein to encourage it to be closer to the skin surface.  Also the subcutaneous fat under the skin bridges was debrided so the vein would be closer to the skin surface.    The skin incisions were closed with 4-0 Vicryl subcuticular stitch. Dermabond was applied to all incisions.  The patient tolerated the procedure well and there were no complications. Instrument sponge and needle counts were correct at the end of the case. The patient was taken to the recovery room in stable condition. The patient had a palpable radial pulse at the end of the case.  Fabienne Bruns, MD

## 2013-03-15 NOTE — Progress Notes (Signed)
report given to shirley R n

## 2013-03-15 NOTE — Progress Notes (Signed)
IV attempted X1 without success.  Veinous access poor.  Notified Dr Chaney Malling and allowed to access HD catheter per protocol

## 2013-03-15 NOTE — Transfer of Care (Signed)
Immediate Anesthesia Transfer of Care Note  Patient: Annette Roach  Procedure(s) Performed: Procedure(s): 2ND STAGE BASCILIC VEIN TRANSPOSITION - RIGHT ARM (Right)  Patient Location: PACU  Anesthesia Type:General  Level of Consciousness: awake, alert , oriented and patient cooperative  Airway & Oxygen Therapy: Patient Spontanous Breathing and Patient connected to nasal cannula oxygen  Post-op Assessment: Report given to PACU RN, Post -op Vital signs reviewed and stable and Patient moving all extremities X 4  Post vital signs: Reviewed and stable  Complications: No apparent anesthesia complications

## 2013-03-15 NOTE — Anesthesia Preprocedure Evaluation (Addendum)
Anesthesia Evaluation  Patient identified by MRN, date of birth, ID band Patient awake    Reviewed: Allergy & Precautions, H&P , NPO status , Patient's Chart, lab work & pertinent test results  Airway Mallampati: II  Neck ROM: full    Dental   Pulmonary shortness of breath and with exertion, COPD COPD inhaler,          Cardiovascular hypertension, Pt. on medications and Pt. on home beta blockers + angina + CAD and + Peripheral Vascular Disease     Neuro/Psych  Headaches, Depression    GI/Hepatic GERD-  Medicated and Controlled,  Endo/Other  diabetes, Type 2  Renal/GU ESRF and DialysisRenal disease     Musculoskeletal  (+) Arthritis -, Osteoarthritis,    Abdominal   Peds  Hematology   Anesthesia Other Findings   Reproductive/Obstetrics                          Anesthesia Physical Anesthesia Plan  ASA: IV  Anesthesia Plan: General   Post-op Pain Management:    Induction: Intravenous  Airway Management Planned: LMA  Additional Equipment:   Intra-op Plan:   Post-operative Plan:   Informed Consent: I have reviewed the patients History and Physical, chart, labs and discussed the procedure including the risks, benefits and alternatives for the proposed anesthesia with the patient or authorized representative who has indicated his/her understanding and acceptance.     Plan Discussed with: CRNA, Anesthesiologist and Surgeon  Anesthesia Plan Comments:         Anesthesia Quick Evaluation

## 2013-03-15 NOTE — Progress Notes (Signed)
Complaining of right arm pain and the dialysis catheter hurting

## 2013-03-15 NOTE — Anesthesia Postprocedure Evaluation (Signed)
Anesthesia Post Note  Patient: Annette Roach  Procedure(s) Performed: Procedure(s) (LRB): 2ND STAGE BASCILIC VEIN TRANSPOSITION - RIGHT ARM (Right)  Anesthesia type: General  Patient location: PACU  Post pain: Pain level controlled and Adequate analgesia  Post assessment: Post-op Vital signs reviewed, Patient's Cardiovascular Status Stable, Respiratory Function Stable, Patent Airway and Pain level controlled  Last Vitals:  Filed Vitals:   03/15/13 1430  BP:   Pulse: 76  Temp:   Resp: 12    Post vital signs: Reviewed and stable  Level of consciousness: awake, alert  and oriented  Complications: No apparent anesthesia complications

## 2013-03-15 NOTE — Progress Notes (Signed)
Right thrill palpated on right upper arm AVF and auscultate a bruit and right radial pulse 2+

## 2013-03-15 NOTE — Telephone Encounter (Addendum)
Message copied by Rosalyn Charters on Wed Mar 15, 2013  2:31 PM ------      Message from: [place], [place] K      Created: Wed Mar 15, 2013  2:00 PM      Regarding: schedule                   ----- Message -----         From: Dara Lords, PA-C         Sent: 03/15/2013   1:42 PM           To: Sharee Pimple, CMA            S/p 2nd stage right BVT 03/15/13.  F/u with Dr. Darrick Penna in 3-4 weeks with duplex of BVT.            Thanks,      Lelon Mast ------  notified patient of fu appt. on 04-27-13 1:30 with dr. Darrick Penna

## 2013-03-15 NOTE — H&P (View-Only) (Signed)
Patient is an 79 year old female who is status post first stage basilic vein transposition in her right arm approximately one month ago. She returns for followup today. She denies any numbness or tingling in her right hand suggestive of steal. She is currently dialyzing via a temporary catheter. She dialyzes on Tuesday Thursday and Saturday.  Current Outpatient Prescriptions on File Prior to Visit  Medication Sig Dispense Refill  . acetaminophen (TYLENOL) 325 MG tablet Take 650 mg by mouth daily as needed. For pain      . albuterol (PROVENTIL HFA;VENTOLIN HFA) 108 (90 BASE) MCG/ACT inhaler Inhale 2 puffs into the lungs every 6 (six) hours as needed. For shortness of breath      . aspirin EC 81 MG tablet Take 81 mg by mouth daily.      Marland Kitchen azelastine (ASTELIN) 137 MCG/SPRAY nasal spray Place 1 spray into the nose daily. Use in each nostril as directed      . b complex-vitamin c-folic acid (NEPHRO-VITE) 0.8 MG TABS Take 0.8 mg by mouth daily.       . calcium acetate (PHOSLO) 667 MG capsule Take 667-1,334 mg by mouth See admin instructions. Take 2 capsules with meals three times a day and one capsule with snacks as needed      . carvedilol (COREG) 12.5 MG tablet Take 12.5 mg by mouth 2 (two) times daily with a meal.      . esomeprazole (NEXIUM) 40 MG capsule Take 40 mg by mouth daily.       Marland Kitchen gabapentin (NEURONTIN) 300 MG capsule Take 300 mg by mouth at bedtime.       . hydrALAZINE (APRESOLINE) 100 MG tablet Take 100 mg by mouth 2 (two) times daily.      . isosorbide mononitrate (IMDUR) 60 MG 24 hr tablet Take 180 mg by mouth daily.      . minoxidil (LONITEN) 2.5 MG tablet Take 2.5 mg by mouth daily.       Marland Kitchen NIFEdipine (PROCARDIA-XL/ADALAT-CC/NIFEDICAL-XL) 30 MG 24 hr tablet Take 90 mg by mouth daily.      . nitroGLYCERIN (NITROSTAT) 0.4 MG SL tablet Place 0.4 mg under the tongue every 5 (five) minutes as needed for chest pain.      Marland Kitchen oxyCODONE (ROXICODONE) 5 MG immediate release tablet Take 1 tablet (5  mg total) by mouth every 6 (six) hours as needed for pain.  30 tablet  0  . oxyCODONE-acetaminophen (PERCOCET/ROXICET) 5-325 MG per tablet Take one or 2 tablets every 4-6 hours as needed for postoperative pain  20 tablet  0  . simvastatin (ZOCOR) 5 MG tablet Take 5 mg by mouth at bedtime.       . sitaGLIPtin (JANUVIA) 25 MG tablet Take 25 mg by mouth daily.      . Wheat Dextrin (BENEFIBER PO) Take 1 scoop by mouth daily.       . cloNIDine (CATAPRES) 0.1 MG tablet Take 0.1 mg by mouth 2 (two) times daily.       No current facility-administered medications on file prior to visit.   Physical exam: Filed Vitals:   03/09/13 0857  BP: 118/46  Pulse: 82  Height: 5\' 2"  (1.575 m)  Weight: 163 lb (73.936 kg)  SpO2: 99%   Right upper extremity: Well-healed antecubital incision audible bruit in the fistula absent right radial pulse  Data: Duplex ultrasound of the right basilic vein fistula was performed today. Throughout most of the course of the fistula it is 5-6 mm in diameter.  There was a suggestion of possible narrowing at the proximal aspect of the fistula but this may just be size caliber mismatch.  Assessment: Maturing right basilic vein transposition fistula.  Plan: Second stage basilic vein transposition Wednesday, July 16. Risks benefits possible complications and procedure details were explained the patient and her daughter today. These include but are not limited to bleeding infection wound problems non-maturation of fistula. They agree to proceed.  Fabienne Bruns, MD [facility] Office: [phone] Pager: [phone]

## 2013-03-20 ENCOUNTER — Encounter (HOSPITAL_COMMUNITY): Payer: Self-pay | Admitting: Vascular Surgery

## 2013-04-27 ENCOUNTER — Ambulatory Visit: Payer: Medicare Other | Admitting: Vascular Surgery

## 2013-05-10 ENCOUNTER — Encounter: Payer: Self-pay | Admitting: Vascular Surgery

## 2013-05-11 ENCOUNTER — Encounter: Payer: Self-pay | Admitting: Vascular Surgery

## 2013-05-11 ENCOUNTER — Encounter (INDEPENDENT_AMBULATORY_CARE_PROVIDER_SITE_OTHER): Payer: Medicare Other | Admitting: *Deleted

## 2013-05-11 ENCOUNTER — Ambulatory Visit (INDEPENDENT_AMBULATORY_CARE_PROVIDER_SITE_OTHER): Payer: Self-pay | Admitting: Vascular Surgery

## 2013-05-11 VITALS — BP 135/49 | HR 79 | Resp 16 | Ht 62.0 in | Wt 170.3 lb

## 2013-05-11 DIAGNOSIS — N186 End stage renal disease: Secondary | ICD-10-CM

## 2013-05-11 DIAGNOSIS — T82598A Other mechanical complication of other cardiac and vascular devices and implants, initial encounter: Secondary | ICD-10-CM

## 2013-05-11 DIAGNOSIS — Z4931 Encounter for adequacy testing for hemodialysis: Secondary | ICD-10-CM

## 2013-05-11 NOTE — Progress Notes (Signed)
The patient is an 79-year-old female who has previously had multiple failed fistulas. Left radiocephalic left brachiocephalic previously. She was recently had a right basilic vein transposition in mid July. She returns today for further followup. She denies any numbness tingling or aching in her right hand. She is currently dialyzing via a catheter. She had numbness and tingling in her left hand but this is improving since carpal tunnel release.  Past Medical History  Diagnosis Date  . COPD (chronic obstructive pulmonary disease)     emphysema  . Neuropathy   . Hyperlipidemia   . Diabetes mellitus   . Chronic kidney disease   . Depression   . Anemia   . Shortness of breath   . GERD (gastroesophageal reflux disease)   . Hyperparathyroidism   . Hypertension     sees Dr. Greg Grisso  . Headache(784.0)   . Arthritis   . CAD (coronary artery disease)     sees Dr. Robert Krasowski, [facility], [place]  . 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 Wears glasses   . Wears hearing aid     right  . Presence of surgically created AV shunt for hemodialysis     old lt upper arm out-rt upper arm shunt in  . Anginal pain     Dr K in [place], [place] follwing  . History of kidney stones   . Constipation   . History of blood transfusion     Past Surgical History  Procedure Laterality Date  . Abdominal hysterectomy  1976  . Kidney stone surgery  2013  . Av fistula placement  01/20/2012    Procedure: ARTERIOVENOUS (AV) FISTULA CREATION;  Surgeon: Charles E Fields, MD;  Location: [facility] OR;  Service: Vascular;  Laterality: Left;  . Hemodialysis catheter  05/25/12    secondary to failed AVF   . Eye surgery      bilateral cataract removed  . Av fistula placement  09/05/2012    Procedure: INSERTION OF ARTERIOVENOUS (AV) GORE-TEX GRAFT ARM;  Surgeon: Charles E Fields, MD;  Location: [facility] OR;  Service: Vascular;  Laterality: Left;  Using 4-7mm x 45 cm Vascular stretch goretex graft   . Ligation of arteriovenous  fistula  09/05/2012    Procedure: LIGATION OF ARTERIOVENOUS  FISTULA;  Surgeon: Charles E Fields, MD;  Location: [facility];  Service: Vascular;  Laterality: Left;  . Ligation arteriovenous gortex graft  09/05/2012    Procedure: LIGATION ARTERIOVENOUS GORTEX GRAFT;  Surgeon: Charles E Fields, MD;  Location: [facility];  Service: Vascular;  Laterality: Left;  . Av fistula placement Right 11/07/2012    Procedure: ARTERIOVENOUS (AV) FISTULA CREATION;  Surgeon: Charles E Fields, MD;  Location: [facility];  Service: Vascular;  Laterality: Right;  Bascilic Vein Fistula  . Appendectomy    . Carpal tunnel release Left 02/10/2013    Procedure: CARPAL TUNNEL RELEASE;  Surgeon: Robert V Sypher Jr., MD;  Location: [facility];  Service: Orthopedics;  Laterality: Left;  . Bascilic vein transposition Right 03/15/2013    Procedure: 2ND STAGE BASCILIC VEIN TRANSPOSITION - RIGHT ARM;  Surgeon: Charles E Fields, MD;  Location: [facility];  Service: Vascular;  Laterality: Right;     Physical exam:  Filed Vitals:   05/11/13 0917  BP: 135/49  Pulse: 79  Resp: 16  Height: 5' 2" (1.575 m)  Weight: 170 lb 4.8 oz (77.248 kg)   Chest: Clear to auscultation  Cardiac: Regular rate and   rhythm Right upper extremity: Palpable thrill and proximal fistula distal fistula difficult to palpate  Data: Duplex exam today shows that the fistula is still fairly small 2-5 mm possible narrowing proximal aspect  Assessment: The patient has had multiple attempts at a previous fistula. None of these is been successful. She has had a catheter for almost a year at this point. A lid best option at this point would be to place an AV graft. This is scheduled for September 15 in the right arm. Possibly will use her basilic vein as outflow.  Risks benefits possible complications procedure details including but limited to bleeding infection graft thrombosis ischemic steal were explained to the patient and her daughter  today. They understand agree to proceed.  Plan: See above  Charles Fields, MD [facility] Office: [phone] Pager: [phone]  

## 2013-05-12 ENCOUNTER — Other Ambulatory Visit: Payer: Self-pay

## 2013-05-12 ENCOUNTER — Encounter (HOSPITAL_COMMUNITY): Payer: Self-pay | Admitting: Respiratory Therapy

## 2013-05-12 ENCOUNTER — Encounter (HOSPITAL_COMMUNITY): Payer: Self-pay | Admitting: Vascular Surgery

## 2013-05-14 MED ORDER — DEXTROSE 5 % IV SOLN
1.5000 g | INTRAVENOUS | Status: AC
  Administered 2013-05-15: 1.5 g via INTRAVENOUS
  Filled 2013-05-14 (×2): qty 1.5

## 2013-05-15 ENCOUNTER — Ambulatory Visit (HOSPITAL_COMMUNITY): Payer: Medicare Other | Admitting: Anesthesiology

## 2013-05-15 ENCOUNTER — Encounter (HOSPITAL_COMMUNITY): Payer: Self-pay | Admitting: Anesthesiology

## 2013-05-15 ENCOUNTER — Encounter (HOSPITAL_COMMUNITY)
Admission: RE | Disposition: A | Discharge status: Home / Self Care | Payer: Self-pay | Source: Ambulatory Visit | Attending: Vascular Surgery

## 2013-05-15 ENCOUNTER — Ambulatory Visit (HOSPITAL_COMMUNITY)
Admission: RE | Admit: 2013-05-15 | Discharge: 2013-05-15 | Disposition: A | Discharge status: Home / Self Care | Payer: Medicare Other | Source: Ambulatory Visit | Attending: Vascular Surgery | Admitting: Vascular Surgery

## 2013-05-15 DIAGNOSIS — N186 End stage renal disease: Secondary | ICD-10-CM

## 2013-05-15 DIAGNOSIS — J4489 Other specified chronic obstructive pulmonary disease: Secondary | ICD-10-CM | POA: Insufficient documentation

## 2013-05-15 DIAGNOSIS — J449 Chronic obstructive pulmonary disease, unspecified: Secondary | ICD-10-CM | POA: Insufficient documentation

## 2013-05-15 DIAGNOSIS — E119 Type 2 diabetes mellitus without complications: Secondary | ICD-10-CM | POA: Insufficient documentation

## 2013-05-15 DIAGNOSIS — Z992 Dependence on renal dialysis: Secondary | ICD-10-CM | POA: Insufficient documentation

## 2013-05-15 DIAGNOSIS — T82898A Other specified complication of vascular prosthetic devices, implants and grafts, initial encounter: Secondary | ICD-10-CM

## 2013-05-15 DIAGNOSIS — I120 Hypertensive chronic kidney disease with stage 5 chronic kidney disease or end stage renal disease: Secondary | ICD-10-CM | POA: Insufficient documentation

## 2013-05-15 DIAGNOSIS — Z79899 Other long term (current) drug therapy: Secondary | ICD-10-CM | POA: Insufficient documentation

## 2013-05-15 HISTORY — PX: AV FISTULA PLACEMENT: SHX1204

## 2013-05-15 HISTORY — DX: Unspecified asthma, uncomplicated: J45.909

## 2013-05-15 LAB — POCT I-STAT 4, (NA,K, GLUC, HGB,HCT)
Glucose, Bld: 107 mg/dL — ABNORMAL HIGH (ref 70–99)
HCT: 42 % (ref 36.0–46.0)

## 2013-05-15 LAB — GLUCOSE, CAPILLARY: Glucose-Capillary: 99 mg/dL (ref 70–99)

## 2013-05-15 SURGERY — INSERTION OF ARTERIOVENOUS (AV) GORE-TEX GRAFT ARM
Anesthesia: Monitor Anesthesia Care | Site: Arm Lower | Laterality: Right | Wound class: Clean

## 2013-05-15 MED ORDER — LIDOCAINE HCL (PF) 1 % IJ SOLN
INTRAMUSCULAR | Status: AC
  Filled 2013-05-15: qty 30

## 2013-05-15 MED ORDER — THROMBIN 20000 UNITS EX SOLR
CUTANEOUS | Status: AC
  Filled 2013-05-15: qty 20000

## 2013-05-15 MED ORDER — HEPARIN SODIUM (PORCINE) 1000 UNIT/ML IJ SOLN
INTRAMUSCULAR | Status: DC | PRN
  Administered 2013-05-15: 5000 [IU] via INTRAVENOUS

## 2013-05-15 MED ORDER — EPHEDRINE SULFATE 50 MG/ML IJ SOLN
INTRAMUSCULAR | Status: DC | PRN
  Administered 2013-05-15: 10 mg via INTRAVENOUS

## 2013-05-15 MED ORDER — PROPOFOL INFUSION 10 MG/ML OPTIME
INTRAVENOUS | Status: DC | PRN
  Administered 2013-05-15: 50 ug/kg/min via INTRAVENOUS

## 2013-05-15 MED ORDER — SODIUM CHLORIDE 0.9 % IR SOLN
Status: DC | PRN
  Administered 2013-05-15: 07:00:00

## 2013-05-15 MED ORDER — HYDROMORPHONE HCL PF 1 MG/ML IJ SOLN: 0.2500 mg | INTRAMUSCULAR | Status: DC | PRN

## 2013-05-15 MED ORDER — OXYCODONE-ACETAMINOPHEN 5-325 MG PO TABS: 1.0000 | ORAL_TABLET | ORAL | Status: DC | PRN

## 2013-05-15 MED ORDER — OXYCODONE HCL 5 MG PO TABS
5.0000 mg | ORAL_TABLET | Freq: Once | ORAL | Status: AC | PRN
  Administered 2013-05-15: 5 mg via ORAL

## 2013-05-15 MED ORDER — SODIUM CHLORIDE 0.9 % IV SOLN
10.0000 mg | INTRAVENOUS | Status: DC | PRN
  Administered 2013-05-15: 25 ug/min via INTRAVENOUS

## 2013-05-15 MED ORDER — OXYCODONE HCL 5 MG PO TABS
ORAL_TABLET | ORAL | Status: AC
  Filled 2013-05-15: qty 1

## 2013-05-15 MED ORDER — 0.9 % SODIUM CHLORIDE (POUR BTL) OPTIME
TOPICAL | Status: DC | PRN
  Administered 2013-05-15: 1000 mL

## 2013-05-15 MED ORDER — MIDAZOLAM HCL 5 MG/5ML IJ SOLN
INTRAMUSCULAR | Status: DC | PRN
  Administered 2013-05-15: 2 mg via INTRAVENOUS

## 2013-05-15 MED ORDER — PHENYLEPHRINE HCL 10 MG/ML IJ SOLN
INTRAMUSCULAR | Status: DC | PRN
  Administered 2013-05-15 (×2): 80 ug via INTRAVENOUS
  Administered 2013-05-15 (×2): 120 ug via INTRAVENOUS
  Administered 2013-05-15: 160 ug via INTRAVENOUS
  Administered 2013-05-15 (×2): 80 ug via INTRAVENOUS

## 2013-05-15 MED ORDER — LIDOCAINE HCL (CARDIAC) 20 MG/ML IV SOLN
INTRAVENOUS | Status: DC | PRN
  Administered 2013-05-15: 50 mg via INTRAVENOUS

## 2013-05-15 MED ORDER — MEPERIDINE HCL 25 MG/ML IJ SOLN: 6.2500 mg | INTRAMUSCULAR | Status: DC | PRN

## 2013-05-15 MED ORDER — PROTAMINE SULFATE 10 MG/ML IV SOLN
INTRAVENOUS | Status: DC | PRN
  Administered 2013-05-15 (×2): 20 mg via INTRAVENOUS
  Administered 2013-05-15: 10 mg via INTRAVENOUS

## 2013-05-15 MED ORDER — ONDANSETRON HCL 4 MG/2ML IJ SOLN
INTRAMUSCULAR | Status: DC | PRN
  Administered 2013-05-15: 4 mg via INTRAVENOUS

## 2013-05-15 MED ORDER — SODIUM CHLORIDE 0.9 % IV SOLN
INTRAVENOUS | Status: DC | PRN
  Administered 2013-05-15 (×2): via INTRAVENOUS

## 2013-05-15 MED ORDER — THROMBIN 20000 UNITS EX SOLR
CUTANEOUS | Status: DC | PRN
  Administered 2013-05-15: 10:00:00 via TOPICAL

## 2013-05-15 MED ORDER — OXYCODONE HCL 5 MG/5ML PO SOLN: 5.0000 mg | Freq: Once | ORAL | Status: AC | PRN

## 2013-05-15 MED ORDER — LIDOCAINE HCL (PF) 1 % IJ SOLN
INTRAMUSCULAR | Status: DC | PRN
  Administered 2013-05-15: 28 mL via INTRADERMAL

## 2013-05-15 MED ORDER — ONDANSETRON HCL 4 MG/2ML IJ SOLN: 4.0000 mg | Freq: Once | INTRAMUSCULAR | Status: DC | PRN

## 2013-05-15 MED ORDER — FENTANYL CITRATE 0.05 MG/ML IJ SOLN
INTRAMUSCULAR | Status: DC | PRN
  Administered 2013-05-15 (×2): 25 ug via INTRAVENOUS
  Administered 2013-05-15: 50 ug via INTRAVENOUS
  Administered 2013-05-15: 25 ug via INTRAVENOUS

## 2013-05-15 SURGICAL SUPPLY — 40 items
ARMBAND PINK RESTRICT EXTREMIT (MISCELLANEOUS) ×2 IMPLANT
BLADE SURG ROTATE 9660 (MISCELLANEOUS) ×2 IMPLANT
CANISTER SUCTION 2500CC (MISCELLANEOUS) ×2 IMPLANT
CLIP TI MEDIUM 6 (CLIP) ×2 IMPLANT
CLIP TI WIDE RED SMALL 6 (CLIP) ×2 IMPLANT
COVER SURGICAL LIGHT HANDLE (MISCELLANEOUS) ×2 IMPLANT
DECANTER SPIKE VIAL GLASS SM (MISCELLANEOUS) ×2 IMPLANT
DERMABOND ADVANCED (GAUZE/BANDAGES/DRESSINGS) ×1
DERMABOND ADVANCED .7 DNX12 (GAUZE/BANDAGES/DRESSINGS) ×1 IMPLANT
DRESSING OPSITE X SMALL 2X3 (GAUZE/BANDAGES/DRESSINGS) ×2 IMPLANT
ELECT REM PT RETURN 9FT ADLT (ELECTROSURGICAL) ×2
ELECTRODE REM PT RTRN 9FT ADLT (ELECTROSURGICAL) ×1 IMPLANT
GAUZE SPONGE 4X4 16PLY XRAY LF (GAUZE/BANDAGES/DRESSINGS) ×2 IMPLANT
GEL ULTRASOUND 20GR AQUASONIC (MISCELLANEOUS) IMPLANT
GEL ULTRASOUND 8.5O AQUASONIC (MISCELLANEOUS) ×2 IMPLANT
GLOVE BIO SURGEON STRL SZ7.5 (GLOVE) ×2 IMPLANT
GLOVE BIOGEL PI IND STRL 7.0 (GLOVE) ×1 IMPLANT
GLOVE BIOGEL PI INDICATOR 7.0 (GLOVE) ×1
GLOVE SS BIOGEL STRL SZ 6.5 (GLOVE) ×2 IMPLANT
GLOVE SUPERSENSE BIOGEL SZ 6.5 (GLOVE) ×2
GOWN PREVENTION PLUS XLARGE (GOWN DISPOSABLE) ×2 IMPLANT
GOWN STRL NON-REIN LRG LVL3 (GOWN DISPOSABLE) ×6 IMPLANT
GRAFT GORETEX STRT 4-7X45 (Vascular Products) ×2 IMPLANT
KIT BASIN OR (CUSTOM PROCEDURE TRAY) ×2 IMPLANT
KIT ROOM TURNOVER OR (KITS) ×2 IMPLANT
LOOP VESSEL MINI RED (MISCELLANEOUS) IMPLANT
NEEDLE HYPO 25GX1X1/2 BEV (NEEDLE) ×4 IMPLANT
NS IRRIG 1000ML POUR BTL (IV SOLUTION) ×2 IMPLANT
PACK CV ACCESS (CUSTOM PROCEDURE TRAY) ×2 IMPLANT
PAD ARMBOARD 7.5X6 YLW CONV (MISCELLANEOUS) ×4 IMPLANT
SPONGE SURGIFOAM ABS GEL 100 (HEMOSTASIS) ×2 IMPLANT
SUT PROLENE 6 0 CC (SUTURE) ×8 IMPLANT
SUT PROLENE 7 0 BV 1 (SUTURE) ×2 IMPLANT
SUT VIC AB 3-0 SH 27 (SUTURE) ×2
SUT VIC AB 3-0 SH 27X BRD (SUTURE) ×2 IMPLANT
SUT VICRYL 4-0 PS2 18IN ABS (SUTURE) ×4 IMPLANT
TOWEL OR 17X24 6PK STRL BLUE (TOWEL DISPOSABLE) ×2 IMPLANT
TOWEL OR 17X26 10 PK STRL BLUE (TOWEL DISPOSABLE) ×2 IMPLANT
UNDERPAD 30X30 INCONTINENT (UNDERPADS AND DIAPERS) ×2 IMPLANT
WATER STERILE IRR 1000ML POUR (IV SOLUTION) ×2 IMPLANT

## 2013-05-15 NOTE — Op Note (Signed)
Procedure: Right Upper Arm AV graft  Preop: ESRD  Postop: ESRD  Anesthesia: General Asst: Della Goo, PA-C  Findings:4-7 mm PTFE end to end to axillary vein   Procedure Details: The right upper extremity was prepped and draped in usual sterile fashion.  A longitudinal incision was then made near the antecubital crease the right arm.  There were no suitable antecubital veins for outflow.   The incision was carried into the subcutaneous tissues down to level of the brachial artery. The patient had a prior basilic vein transposition and the scar in this area was quite dense Next the brachial artery was dissected free in the medial portion incision. As well as the proximal portion of the transposition fistula. The artery was  2-3 mm in diameter. The vessel loops were placed proximal and distal to the planned site of arteriotomy. At this point, a longitudinal incision was made in the axilla and carried through the subcutaneous tissues and fascia to expose the axillary vein.  The scar was also quite dense in this area.  The nerves were protected.  The vein was approximately 3.5 mm in diameter. Next, a subcutaneous tunnel was created connecting the upper arm to the lower arm incision in an arcing configuration over the biceps muscle.  A 4-7 mm PTFE graft was then brought through this subcutaneous tunnel. The patient was given 5000 units of intravenous heparin. After appropriate circulation time, the vessel loops were used to control the artery. A longitudinal opening was made in the right brachial artery.  The fistula was ligated distally and debrided back to the artery.  The 4 mm end of the graft was beveled and sewn end to side to the artery using a 6 0 prolene.  At completion of the anastomosis the artery was forward bled, backbled and thoroughly flushed.  The anastomosis was secured, vessel loops were released and there was palpable pulse in the graft.  The graft was clamped just above the arterial  anastomosis with a fistula clamp. The graft was then pulled taut to length at the axillary incision.  The axillary vein was controlled with a fine bulldog clamp in the upper axilla.  The vein was transected and spatulated.  The distal end of the graft was then beveled and sewn end to end to the vein using a running 6 0 prolene.  Just prior to completion of the anastomosis, everything was forward bled, back bled and thoroughly flushed.  The anastomosis was secured and the fistula clamp removed from the proximal graft.  A thrill was palpable in the graft. The patient was given 50 mg of protamine to assist with hemostasis.  Thrombin and gelfoam were also applied.  After hemostasis was obtained, the subcutaneous tissues were reapproximated using a running 3-0 Vicryl suture. The skin was then closed with a 4 0 Vicryl subcuticular stitch. Dermabond was applied to the skin incisions.  The patient tolerated the procedure well and there were no complications.  Instrument sponge and needle count was correct at the end of the case.  The patient was taken to the recovery room in stable condition. The patient had audible radial and ulnar doppler signals at the end of the case.  Fabienne Bruns, MD [facility] [place] [facility] Office: [phone] Pager: [phone]

## 2013-05-15 NOTE — Anesthesia Postprocedure Evaluation (Signed)
Anesthesia Post Note  Patient: Annette Roach  Procedure(s) Performed: Procedure(s) (LRB): INSERTION OF ARTERIOVENOUS (AV) GORE-TEX GRAFT ARM-RIGHT (Right)  Anesthesia type: general  Patient location: PACU  Post pain: Pain level controlled  Post assessment: Patient's Cardiovascular Status Stable  Last Vitals:  Filed Vitals:   05/15/13 1046  BP: 123/49  Pulse: 73  Temp: 36.9 C  Resp: 22    Post vital signs: Reviewed and stable  Level of consciousness: sedated  Complications: No apparent anesthesia complications

## 2013-05-15 NOTE — Transfer of Care (Signed)
Immediate Anesthesia Transfer of Care Note  Patient: Annette Roach  Procedure(s) Performed: Procedure(s): INSERTION OF ARTERIOVENOUS (AV) GORE-TEX GRAFT ARM-RIGHT (Right)  Patient Location: PACU  Anesthesia Type:General  Level of Consciousness: sedated  Airway & Oxygen Therapy: Patient Spontanous Breathing and Patient connected to face mask oxygen  Post-op Assessment: Report given to PACU RN, Post -op Vital signs reviewed and stable and Patient moving all extremities X 4  Post vital signs: Reviewed  Complications: No apparent anesthesia complications

## 2013-05-15 NOTE — H&P (View-Only) (Signed)
The patient is an 79 year old female who has previously had multiple failed fistulas. Left radiocephalic left brachiocephalic previously. She was recently had a right basilic vein transposition in mid July. She returns today for further followup. She denies any numbness tingling or aching in her right hand. She is currently dialyzing via a catheter. She had numbness and tingling in her left hand but this is improving since carpal tunnel release.  Past Medical History  Diagnosis Date  . COPD (chronic obstructive pulmonary disease)     emphysema  . Neuropathy   . Hyperlipidemia   . Diabetes mellitus   . Chronic kidney disease   . Depression   . Anemia   . Shortness of breath   . GERD (gastroesophageal reflux disease)   . Hyperparathyroidism   . Hypertension     sees Dr. Feliciana Rossetti  . Headache(784.0)   . Arthritis   . CAD (coronary artery disease)     sees Dr. Gypsy Balsam, [facility], Rosalita Levan  . 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 Wears glasses   . Wears hearing aid     right  . Presence of surgically created AV shunt for hemodialysis     old lt upper arm out-rt upper arm shunt in  . Anginal pain     Dr Kirtland Bouchard in [place], [place] follwing  . History of kidney stones   . Constipation   . History of blood transfusion     Past Surgical History  Procedure Laterality Date  . Abdominal hysterectomy  1976  . Kidney stone surgery  2013  . Av fistula placement  01/20/2012    Procedure: ARTERIOVENOUS (AV) FISTULA CREATION;  Surgeon: Sherren Kerns, MD;  Location: [facility] OR;  Service: Vascular;  Laterality: Left;  . Hemodialysis catheter  05/25/12    secondary to failed AVF   . Eye surgery      bilateral cataract removed  . Av fistula placement  09/05/2012    Procedure: INSERTION OF ARTERIOVENOUS (AV) GORE-TEX GRAFT ARM;  Surgeon: Sherren Kerns, MD;  Location: [facility] OR;  Service: Vascular;  Laterality: Left;  Using 4-41mm x 45 cm Vascular stretch goretex graft   . Ligation of arteriovenous  fistula  09/05/2012    Procedure: LIGATION OF ARTERIOVENOUS  FISTULA;  Surgeon: Sherren Kerns, MD;  Location[facility] OR;  Service: Vascular;  Laterality: Left;  . Ligation arteriovenous gortex graft  09/05/2012    Procedure: LIGATION ARTERIOVENOUS GORTEX GRAFT;  Surgeon: Sherren Kerns, MD;  Location: [facility] OR;  Service: Vascular;  Laterality: Left;  . Av fistula placement Right 11/07/2012    Procedure: ARTERIOVENOUS (AV) FISTULA CREATION;  Surgeon: Sherren Kerns, MD;  Location: [facility] OR;  Service: Vascular;  Laterality: Right;  Bascilic Vein Fistula  . Appendectomy    . Carpal tunnel release Left 02/10/2013    Procedure: CARPAL TUNNEL RELEASE;  Surgeon: Wyn Forster., MD;  Location: [facility];  Service: Orthopedics;  Laterality: Left;  . Bascilic vein transposition Right 03/15/2013    Procedure: 2ND STAGE BASCILIC VEIN TRANSPOSITION - RIGHT ARM;  Surgeon: Sherren Kerns, MD;  Location: [facility] OR;  Service: Vascular;  Laterality: Right;     Physical exam:  Filed Vitals:   05/11/13 0917  BP: 135/49  Pulse: 79  Resp: 16  Height: 5\' 2"  (1.575 m)  Weight: 170 lb 4.8 oz (77.248 kg)   Chest: Clear to auscultation  Cardiac: Regular rate and  rhythm Right upper extremity: Palpable thrill and proximal fistula distal fistula difficult to palpate  Data: Duplex exam today shows that the fistula is still fairly small 2-5 mm possible narrowing proximal aspect  Assessment: The patient has had multiple attempts at a previous fistula. None of these is been successful. She has had a catheter for almost a year at this point. A lid best option at this point would be to place an AV graft. This is scheduled for September 15 in the right arm. Possibly will use her basilic vein as outflow.  Risks benefits possible complications procedure details including but limited to bleeding infection graft thrombosis ischemic steal were explained to the patient and her daughter  today. They understand agree to proceed.  Plan: See above  Fabienne Bruns, MD [facility] [place] Office: [phone] Pager: [phone]

## 2013-05-15 NOTE — Anesthesia Preprocedure Evaluation (Addendum)
Anesthesia Evaluation  Patient identified by MRN, date of birth, ID band Patient awake    Reviewed: Allergy & Precautions, H&P , NPO status , Patient's Chart, lab work & pertinent test results  History of Anesthesia Complications (+) MALIGNANT HYPERTHERMIA  Airway Mallampati: II TM Distance: >3 FB Neck ROM: full    Dental  (+) Dental Advidsory Given and Poor Dentition   Pulmonary shortness of breath and with exertion, asthma , COPD         Cardiovascular hypertension, On Medications and On Home Beta Blockers + angina + CAD and + Peripheral Vascular Disease     Neuro/Psych  Headaches,    GI/Hepatic GERD-  Medicated and Controlled,  Endo/Other  diabetes, Type 2  Renal/GU ESRFRenal disease     Musculoskeletal   Abdominal   Peds  Hematology  (+) anemia ,   Anesthesia Other Findings   Reproductive/Obstetrics                         Anesthesia Physical Anesthesia Plan  ASA: III  Anesthesia Plan: MAC   Post-op Pain Management:    Induction: Intravenous  Airway Management Planned: Simple Face Mask  Additional Equipment:   Intra-op Plan:   Post-operative Plan:   Informed Consent: I have reviewed the patients History and Physical, chart, labs and discussed the procedure including the risks, benefits and alternatives for the proposed anesthesia with the patient or authorized representative who has indicated his/her understanding and acceptance.   Dental Advisory Given  Plan Discussed with: CRNA and Surgeon  Anesthesia Plan Comments:        Anesthesia Quick Evaluation

## 2013-05-15 NOTE — Interval H&P Note (Signed)
History and Physical Interval Note:  05/15/2013 7:42 AM  Annette Roach  has presented today for surgery, with the diagnosis of End Stage Renal Disease  The various methods of treatment have been discussed with the patient and family. After consideration of risks, benefits and other options for treatment, the patient has consented to  Procedure(s): INSERTION OF ARTERIOVENOUS (AV) GORE-TEX GRAFT ARM-RIGHT (Right) as a surgical intervention .  The patient's history has been reviewed, patient examined, no change in status, stable for surgery.  I have reviewed the patient's chart and labs.  Questions were answered to the patient's satisfaction.     FIELDS,CHARLES E

## 2013-05-15 NOTE — Anesthesia Procedure Notes (Signed)
Procedure Name: MAC Date/Time: 05/15/2013 7:49 AM Performed by: Carmela Rima Pre-anesthesia Checklist: Patient being monitored, Timeout performed, Suction available, Emergency Drugs available and Patient identified Patient Re-evaluated:Patient Re-evaluated prior to inductionOxygen Delivery Method: Simple face mask Placement Confirmation: positive ETCO2

## 2013-05-15 NOTE — Preoperative (Signed)
Beta Blockers   Reason not to administer Beta Blockers:Not Applicable 

## 2013-05-16 ENCOUNTER — Encounter (HOSPITAL_COMMUNITY): Payer: Self-pay | Admitting: Vascular Surgery

## 2014-08-09 ENCOUNTER — Encounter (HOSPITAL_COMMUNITY): Payer: Self-pay | Admitting: Vascular Surgery

## 2014-10-15 ENCOUNTER — Emergency Department (HOSPITAL_COMMUNITY)
Admission: EM | Admit: 2014-10-15 | Discharge: 2014-10-15 | Disposition: A | Discharge status: Home / Self Care | Payer: Medicare Other | Attending: Emergency Medicine | Admitting: Emergency Medicine

## 2014-10-15 ENCOUNTER — Encounter (HOSPITAL_COMMUNITY): Payer: Self-pay | Admitting: Adult Health

## 2014-10-15 ENCOUNTER — Emergency Department (HOSPITAL_COMMUNITY): Payer: Medicare Other

## 2014-10-15 DIAGNOSIS — Z7982 Long term (current) use of aspirin: Secondary | ICD-10-CM | POA: Diagnosis not present

## 2014-10-15 DIAGNOSIS — Z9889 Other specified postprocedural states: Secondary | ICD-10-CM | POA: Diagnosis not present

## 2014-10-15 DIAGNOSIS — S99921A Unspecified injury of right foot, initial encounter: Secondary | ICD-10-CM | POA: Diagnosis present

## 2014-10-15 DIAGNOSIS — Z862 Personal history of diseases of the blood and blood-forming organs and certain disorders involving the immune mechanism: Secondary | ICD-10-CM | POA: Insufficient documentation

## 2014-10-15 DIAGNOSIS — J449 Chronic obstructive pulmonary disease, unspecified: Secondary | ICD-10-CM | POA: Diagnosis not present

## 2014-10-15 DIAGNOSIS — Y998 Other external cause status: Secondary | ICD-10-CM | POA: Diagnosis not present

## 2014-10-15 DIAGNOSIS — F329 Major depressive disorder, single episode, unspecified: Secondary | ICD-10-CM | POA: Insufficient documentation

## 2014-10-15 DIAGNOSIS — Z87442 Personal history of urinary calculi: Secondary | ICD-10-CM | POA: Diagnosis not present

## 2014-10-15 DIAGNOSIS — E785 Hyperlipidemia, unspecified: Secondary | ICD-10-CM | POA: Insufficient documentation

## 2014-10-15 DIAGNOSIS — Y9389 Activity, other specified: Secondary | ICD-10-CM | POA: Diagnosis not present

## 2014-10-15 DIAGNOSIS — N189 Chronic kidney disease, unspecified: Secondary | ICD-10-CM | POA: Insufficient documentation

## 2014-10-15 DIAGNOSIS — G629 Polyneuropathy, unspecified: Secondary | ICD-10-CM | POA: Insufficient documentation

## 2014-10-15 DIAGNOSIS — W1839XA Other fall on same level, initial encounter: Secondary | ICD-10-CM | POA: Insufficient documentation

## 2014-10-15 DIAGNOSIS — M25571 Pain in right ankle and joints of right foot: Secondary | ICD-10-CM

## 2014-10-15 DIAGNOSIS — K219 Gastro-esophageal reflux disease without esophagitis: Secondary | ICD-10-CM | POA: Insufficient documentation

## 2014-10-15 DIAGNOSIS — S99911A Unspecified injury of right ankle, initial encounter: Secondary | ICD-10-CM | POA: Insufficient documentation

## 2014-10-15 DIAGNOSIS — I129 Hypertensive chronic kidney disease with stage 1 through stage 4 chronic kidney disease, or unspecified chronic kidney disease: Secondary | ICD-10-CM | POA: Diagnosis not present

## 2014-10-15 DIAGNOSIS — Z79899 Other long term (current) drug therapy: Secondary | ICD-10-CM | POA: Diagnosis not present

## 2014-10-15 DIAGNOSIS — Y9289 Other specified places as the place of occurrence of the external cause: Secondary | ICD-10-CM | POA: Insufficient documentation

## 2014-10-15 DIAGNOSIS — M1990 Unspecified osteoarthritis, unspecified site: Secondary | ICD-10-CM | POA: Insufficient documentation

## 2014-10-15 DIAGNOSIS — W19XXXA Unspecified fall, initial encounter: Secondary | ICD-10-CM

## 2014-10-15 DIAGNOSIS — I2510 Atherosclerotic heart disease of native coronary artery without angina pectoris: Secondary | ICD-10-CM | POA: Insufficient documentation

## 2014-10-15 MED ORDER — HYDROCODONE-ACETAMINOPHEN 10-325 MG PO TABS: 1.0000 | ORAL_TABLET | Freq: Four times a day (QID) | ORAL | Status: DC | PRN

## 2014-10-15 MED ORDER — HYDROMORPHONE HCL 1 MG/ML IJ SOLN
1.0000 mg | Freq: Once | INTRAMUSCULAR | Status: AC
  Administered 2014-10-15: 1 mg via INTRAMUSCULAR
  Filled 2014-10-15: qty 1

## 2014-10-15 NOTE — ED Notes (Addendum)
Presents with known foot fracture-right foot in walking boot-pt fell again at home on sunday and is afraid she worsened the injury due to increased pain in the right foot. No other injuries. She is able to wiggle digits of right foot. Pain rated 10/10. She was seen at [facility] yesterday but pain is still severe.  Home pain medication is not helping.

## 2014-10-15 NOTE — Discharge Instructions (Signed)
As discussed, your evaluation today has been largely reassuring.  But, it is important that you monitor your condition carefully, and do not hesitate to return to the ED if you develop new, or concerning changes in your condition. ? ?Otherwise, please follow-up with your physician for appropriate ongoing care. ? ?

## 2014-10-15 NOTE — ED Provider Notes (Signed)
CSN: [id number]     Arrival date & time 10/15/14  1745 History   First MD Initiated Contact with Patient 10/15/14 1815     Chief Complaint  Patient presents with  . Foot Injury     HPI  Patient presents with concern of pain in her right lateral foot and ankle following a fall yesterday. Yesterday the patient was wearing a brace on her ankle, when she had a minor mechanical fall.  However, since that time she has had more severe pain in the right lateral ankle and foot.  She was at another facility yesterday, had x-rays which were reportedly normal.  She received oxycodone, which has not controlled her pain. Notably, the patient had a fall 2 months ago resulting in an ankle fracture. Patient had immobilization, no surgery, and has been wearing a Cam Walker for several weeks. Recovery has been generally good, the patient has been using a walker since the initial fall. Since the fall yesterday, no other new pain anywhere, she continues to do airway on both legs.  Past Medical History  Diagnosis Date  . COPD (chronic obstructive pulmonary disease)     emphysema  . Neuropathy   . Hyperlipidemia   . Diabetes mellitus   . Depression   . Anemia   . Shortness of breath   . GERD (gastroesophageal reflux disease)   . Hyperparathyroidism   . Hypertension     sees Dr. Gilford Rile  . Headache(784.0)   . Arthritis   . CAD (coronary artery disease)     sees Dr. Jenne Campus, [facility]ology [facility], Tia Alert  . 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 Wears glasses   . Wears hearing aid     right  . Presence of surgically created AV shunt for hemodialysis     old lt upper arm out-rt upper arm shunt in  . Anginal pain     Dr Raliegh Ip in [place], [place]  . History of kidney stones   . Constipation   . History of blood transfusion   . Asthma     years ago  . Chronic kidney disease     [facility] [place] [place]- Hemo   Past Surgical History  Procedure Laterality Date    . Abdominal hysterectomy  1976  . Kidney stone surgery  2013  . Av fistula placement  01/20/2012    Procedure: ARTERIOVENOUS (AV) FISTULA CREATION;  Surgeon: Elam Dutch, MD;  Location: [place];  Service: Vascular;  Laterality: Left;  . Hemodialysis catheter  05/25/12    secondary to failed AVF   . Eye surgery      bilateral cataract removed  . Av fistula placement  09/05/2012    Procedure: INSERTION OF ARTERIOVENOUS (AV) GORE-TEX GRAFT ARM;  Surgeon: Elam Dutch, MD;  Location: [facility] OR;  Service: Vascular;  Laterality: Left;  Using 4-76mm x 45 cm Vascular stretch goretex graft  . Ligation of arteriovenous  fistula  09/05/2012    Procedure: LIGATION OF ARTERIOVENOUS  FISTULA;  Surgeon: Elam Dutch, MD;  Location: [place];  Service: Vascular;  Laterality: Left;  . Ligation arteriovenous gortex graft  09/05/2012    Procedure: LIGATION ARTERIOVENOUS GORTEX GRAFT;  Surgeon: Elam Dutch, MD;  Location: [place];  Service: Vascular;  Laterality: Left;  . Av fistula placement Right 11/07/2012    Procedure: ARTERIOVENOUS (AV) FISTULA CREATION;  Surgeon: Elam Dutch, MD;  Location: [place];  Service: Vascular;  Laterality: Right;  Bascilic Vein Fistula  . Appendectomy    . Carpal tunnel release Left 02/10/2013    Procedure: CARPAL TUNNEL RELEASE;  Surgeon: Cammie Sickle., MD;  Location: [place];  Service: Orthopedics;  Laterality: Left;  . Bascilic vein transposition Right 03/15/2013    Procedure: 2ND STAGE BASCILIC VEIN TRANSPOSITION - RIGHT ARM;  Surgeon: Elam Dutch, MD;  Location: [place];  Service: Vascular;  Laterality: Right;  . Av fistula placement Right 05/15/2013    Procedure: INSERTION OF ARTERIOVENOUS (AV) GORE-TEX GRAFT ARM-RIGHT;  Surgeon: Elam Dutch, MD;  Location: [place];  Service: Vascular;  Laterality: Right;  . Shuntogram N/A 07/27/2012    Procedure: Earney Mallet;  Surgeon: Rosetta Posner, MD;  Location: [facility] CATH LAB;  Service: Cardiovascular;   Laterality: N/A;   Family History  Problem Relation Age of Onset  . Diabetes Mother   . Cancer Father   . Deep vein thrombosis Brother   . Hypertension Brother    History  Substance Use Topics  . Smoking status: Never Smoker   . Smokeless tobacco: Never Used  . Alcohol Use: No   OB History    No data available     Review of Systems  All other systems reviewed and are negative.     Allergies  Review of patient's allergies indicates no known allergies.  Home Medications   Prior to Admission medications   Medication Sig Start Date End Date Taking? Authorizing Provider  albuterol (PROVENTIL HFA;VENTOLIN HFA) 108 (90 BASE) MCG/ACT inhaler Inhale 2 puffs into the lungs every 6 (six) hours as needed. For shortness of breath    Historical Provider, MD  aspirin EC 81 MG tablet Take 81 mg by mouth daily.    Historical Provider, MD  azelastine (ASTELIN) 137 MCG/SPRAY nasal spray Place 1 spray into the nose daily. Use in each nostril as directed    Historical Provider, MD  b complex-vitamin c-folic acid (NEPHRO-VITE) 0.8 MG TABS Take 0.8 mg by mouth daily.     Historical Provider, MD  calcium acetate (PHOSLO) 667 MG capsule Take 667-1,334 mg by mouth See admin instructions. Take 2 capsules with meals three times a day and one capsule with snacks as needed    Historical Provider, MD  carvedilol (COREG) 12.5 MG tablet Take 12.5 mg by mouth 2 (two) times daily with a meal.    Historical Provider, MD  esomeprazole (NEXIUM) 40 MG capsule Take 40 mg by mouth daily.     Historical Provider, MD  gabapentin (NEURONTIN) 300 MG capsule Take 300 mg by mouth at bedtime.     Historical Provider, MD  hydrALAZINE (APRESOLINE) 100 MG tablet Take 100 mg by mouth 2 (two) times daily.    Historical Provider, MD  isosorbide mononitrate (IMDUR) 60 MG 24 hr tablet Take 180 mg by mouth daily.    Historical Provider, MD  minoxidil (LONITEN) 2.5 MG tablet Take 2.5 mg by mouth daily.     Historical Provider, MD    nitroGLYCERIN (NITROSTAT) 0.4 MG SL tablet Place 0.4 mg under the tongue every 5 (five) minutes as needed for chest pain.    Historical Provider, MD  oxyCODONE-acetaminophen (ROXICET) 5-325 MG per tablet Take 1-2 tablets by mouth every 4 (four) hours as needed for pain. 05/15/13   Regina J Roczniak, PA-C  simvastatin (ZOCOR) 5 MG tablet Take 5 mg by mouth at bedtime.     Historical Provider, MD  sitaGLIPtin (JANUVIA) 25 MG tablet Take  25 mg by mouth daily.    Historical Provider, MD  SYMBICORT 160-4.5 MCG/ACT inhaler Inhale 1 puff into the lungs 2 (two) times daily. 05/09/13   Historical Provider, MD  Wheat Dextrin (BENEFIBER PO) Take 1 scoop by mouth daily.     Historical Provider, MD   BP 148/53 mmHg  Pulse 93  Temp(Src) 98.6 F (37 C) (Oral)  Resp 18  Ht 5\' 2"  (1.575 m)  Wt 176 lb 5.9 oz (80 kg)  BMI 32.25 kg/m2  SpO2 91% Physical Exam  Constitutional: She is oriented to person, place, and time. She appears well-developed and well-nourished. No distress.  HENT:  Head: Normocephalic and atraumatic.  Eyes: Conjunctivae and EOM are normal.  Cardiovascular: Normal rate, regular rhythm and intact distal pulses.   Pulmonary/Chest: Effort normal. No stridor. No respiratory distress.  Abdominal: She exhibits no distension.  Musculoskeletal: She exhibits no edema.       Right knee: Normal.       Feet:  Patient flexes and extends the foot freely, though has pain about the lateral aspect with both flexion and extension.  Neurological: She is alert and oriented to person, place, and time. No cranial nerve deficit.  Skin: Skin is warm and dry.  Psychiatric: She has a normal mood and affect.  Nursing note and vitals reviewed.   ED Course  Procedures (including critical care time)  Imaging Review Dg Foot Complete Right  10/15/2014   CLINICAL DATA:  History of right foot fracture 2 months ago. Patient status post fall 10/14/2014. Right foot pain. Initial encounter.  EXAM: RIGHT FOOT  COMPLETE - 3+ VIEW  COMPARISON:  Plain films right foot 10/15/2014 at 2 a.m. 07/28/2014.  FINDINGS: Bones are osteopenic. No fracture or dislocation is identified. No notable arthropathy. Small calcaneal spur is noted. Atherosclerotic vascular disease is identified.  IMPRESSION: No acute abnormality.  Osteopenia.  Atherosclerosis.   Electronically Signed   By: Inge Rise M.D.   On: 10/15/2014 18:51   I reviewed the x-ray myself, demonstrated the initial films to the patient and her daughter. I agree with the interpretation.  MDM   Final diagnoses:  Fall    Patient presents after a fall that occurred while she is in the midst of recovery from prior ankle fracture.  Today, no evidence for new fracture, though she may have suffered a sprain. Patient received additional analgesics, was provided additional resources for different orthopedic follow-up should she consider additional evaluation, and absent any new neurovascular compromise, other evidence for acute pathology, she was appropriate for discharge with further evaluation as an outpatient.    Carmin Muskrat, MD 10/15/14 2029

## 2014-10-27 ENCOUNTER — Inpatient Hospital Stay (HOSPITAL_COMMUNITY)
Admission: AD | Admit: 2014-10-27 | Discharge: 2014-10-28 | DRG: 564 | Disposition: A | Discharge status: Home / Self Care | Payer: Medicare Other | Source: Other Acute Inpatient Hospital | Attending: Internal Medicine | Admitting: Internal Medicine

## 2014-10-27 ENCOUNTER — Encounter (HOSPITAL_COMMUNITY): Payer: Self-pay | Admitting: *Deleted

## 2014-10-27 ENCOUNTER — Inpatient Hospital Stay (HOSPITAL_COMMUNITY): Payer: Medicare Other

## 2014-10-27 DIAGNOSIS — J45909 Unspecified asthma, uncomplicated: Secondary | ICD-10-CM | POA: Diagnosis present

## 2014-10-27 DIAGNOSIS — I440 Atrioventricular block, first degree: Secondary | ICD-10-CM | POA: Diagnosis not present

## 2014-10-27 DIAGNOSIS — N186 End stage renal disease: Secondary | ICD-10-CM | POA: Diagnosis present

## 2014-10-27 DIAGNOSIS — Z87442 Personal history of urinary calculi: Secondary | ICD-10-CM | POA: Diagnosis present

## 2014-10-27 DIAGNOSIS — Z992 Dependence on renal dialysis: Secondary | ICD-10-CM | POA: Diagnosis not present

## 2014-10-27 DIAGNOSIS — R1084 Generalized abdominal pain: Secondary | ICD-10-CM

## 2014-10-27 DIAGNOSIS — G629 Polyneuropathy, unspecified: Secondary | ICD-10-CM | POA: Diagnosis not present

## 2014-10-27 DIAGNOSIS — I509 Heart failure, unspecified: Secondary | ICD-10-CM

## 2014-10-27 DIAGNOSIS — D481 Neoplasm of uncertain behavior of connective and other soft tissue: Principal | ICD-10-CM | POA: Diagnosis present

## 2014-10-27 DIAGNOSIS — F329 Major depressive disorder, single episode, unspecified: Secondary | ICD-10-CM | POA: Diagnosis not present

## 2014-10-27 DIAGNOSIS — R4182 Altered mental status, unspecified: Secondary | ICD-10-CM | POA: Diagnosis present

## 2014-10-27 DIAGNOSIS — D649 Anemia, unspecified: Secondary | ICD-10-CM | POA: Diagnosis not present

## 2014-10-27 DIAGNOSIS — Z79899 Other long term (current) drug therapy: Secondary | ICD-10-CM | POA: Diagnosis not present

## 2014-10-27 DIAGNOSIS — K210 Gastro-esophageal reflux disease with esophagitis: Secondary | ICD-10-CM

## 2014-10-27 DIAGNOSIS — J4520 Mild intermittent asthma, uncomplicated: Secondary | ICD-10-CM

## 2014-10-27 DIAGNOSIS — T82898A Other specified complication of vascular prosthetic devices, implants and grafts, initial encounter: Secondary | ICD-10-CM | POA: Diagnosis present

## 2014-10-27 DIAGNOSIS — I4891 Unspecified atrial fibrillation: Secondary | ICD-10-CM | POA: Diagnosis not present

## 2014-10-27 DIAGNOSIS — Z452 Encounter for adjustment and management of vascular access device: Secondary | ICD-10-CM

## 2014-10-27 DIAGNOSIS — R109 Unspecified abdominal pain: Secondary | ICD-10-CM | POA: Diagnosis present

## 2014-10-27 DIAGNOSIS — E119 Type 2 diabetes mellitus without complications: Secondary | ICD-10-CM | POA: Diagnosis not present

## 2014-10-27 DIAGNOSIS — Z7982 Long term (current) use of aspirin: Secondary | ICD-10-CM

## 2014-10-27 DIAGNOSIS — E785 Hyperlipidemia, unspecified: Secondary | ICD-10-CM | POA: Diagnosis not present

## 2014-10-27 DIAGNOSIS — I10 Essential (primary) hypertension: Secondary | ICD-10-CM

## 2014-10-27 DIAGNOSIS — F32A Depression, unspecified: Secondary | ICD-10-CM | POA: Diagnosis present

## 2014-10-27 DIAGNOSIS — N2581 Secondary hyperparathyroidism of renal origin: Secondary | ICD-10-CM | POA: Diagnosis not present

## 2014-10-27 DIAGNOSIS — R509 Fever, unspecified: Secondary | ICD-10-CM | POA: Insufficient documentation

## 2014-10-27 DIAGNOSIS — I2510 Atherosclerotic heart disease of native coronary artery without angina pectoris: Secondary | ICD-10-CM | POA: Diagnosis not present

## 2014-10-27 DIAGNOSIS — J449 Chronic obstructive pulmonary disease, unspecified: Secondary | ICD-10-CM | POA: Diagnosis present

## 2014-10-27 DIAGNOSIS — I120 Hypertensive chronic kidney disease with stage 5 chronic kidney disease or end stage renal disease: Secondary | ICD-10-CM | POA: Diagnosis present

## 2014-10-27 DIAGNOSIS — M1990 Unspecified osteoarthritis, unspecified site: Secondary | ICD-10-CM | POA: Diagnosis not present

## 2014-10-27 DIAGNOSIS — R0602 Shortness of breath: Secondary | ICD-10-CM | POA: Insufficient documentation

## 2014-10-27 DIAGNOSIS — K219 Gastro-esophageal reflux disease without esophagitis: Secondary | ICD-10-CM | POA: Diagnosis present

## 2014-10-27 DIAGNOSIS — R079 Chest pain, unspecified: Secondary | ICD-10-CM | POA: Diagnosis present

## 2014-10-27 LAB — CBC
HCT: 31.4 % — ABNORMAL LOW (ref 36.0–46.0)
Hemoglobin: 9.8 g/dL — ABNORMAL LOW (ref 12.0–15.0)
MCH: 28.2 pg (ref 26.0–34.0)
MCHC: 31.2 g/dL (ref 30.0–36.0)
MCV: 90.2 fL (ref 78.0–100.0)
Platelets: 331 10*3/uL (ref 150–400)
RBC: 3.48 MIL/uL — AB (ref 3.87–5.11)
RDW: 16.3 % — ABNORMAL HIGH (ref 11.5–15.5)
WBC: 9.9 10*3/uL (ref 4.0–10.5)

## 2014-10-27 LAB — COMPREHENSIVE METABOLIC PANEL
ALT: 10 U/L (ref 0–35)
AST: 24 U/L (ref 0–37)
Albumin: 2.8 g/dL — ABNORMAL LOW (ref 3.5–5.2)
Alkaline Phosphatase: 78 U/L (ref 39–117)
Anion gap: 11 (ref 5–15)
BUN: 24 mg/dL — ABNORMAL HIGH (ref 6–23)
CHLORIDE: 93 mmol/L — AB (ref 96–112)
CO2: 35 mmol/L — ABNORMAL HIGH (ref 19–32)
Calcium: 9 mg/dL (ref 8.4–10.5)
Creatinine, Ser: 8.79 mg/dL — ABNORMAL HIGH (ref 0.50–1.10)
GFR calc Af Amer: 4 mL/min — ABNORMAL LOW (ref >90)
GFR, EST NON AFRICAN AMERICAN: 4 mL/min — AB (ref >90)
GLUCOSE: 68 mg/dL — AB (ref 70–99)
Potassium: 4.5 mmol/L (ref 3.5–5.1)
SODIUM: 139 mmol/L (ref 135–145)
TOTAL PROTEIN: 6.8 g/dL (ref 6.0–8.3)
Total Bilirubin: 0.4 mg/dL (ref 0.3–1.2)

## 2014-10-27 LAB — PROTIME-INR
INR: 1.11 (ref 0.00–1.49)
PROTHROMBIN TIME: 14.5 s (ref 11.6–15.2)

## 2014-10-27 LAB — GLUCOSE, CAPILLARY
GLUCOSE-CAPILLARY: 108 mg/dL — AB (ref 70–99)
Glucose-Capillary: 138 mg/dL — ABNORMAL HIGH (ref 70–99)
Glucose-Capillary: 86 mg/dL (ref 70–99)
Glucose-Capillary: 78 mg/dL (ref 70–99)

## 2014-10-27 LAB — LACTIC ACID, PLASMA: LACTIC ACID, VENOUS: 1.2 mmol/L (ref 0.5–2.0)

## 2014-10-27 LAB — LIPASE, BLOOD: Lipase: 19 U/L (ref 11–59)

## 2014-10-27 LAB — TROPONIN I
TROPONIN I: 0.06 ng/mL — AB (ref <0.031)
Troponin I: 0.06 ng/mL — ABNORMAL HIGH (ref <0.031)

## 2014-10-27 MED ORDER — ONDANSETRON HCL 4 MG PO TABS: 4.0000 mg | ORAL_TABLET | Freq: Four times a day (QID) | ORAL | Status: DC | PRN

## 2014-10-27 MED ORDER — HEPARIN SODIUM (PORCINE) 5000 UNIT/ML IJ SOLN
5000.0000 [IU] | Freq: Three times a day (TID) | INTRAMUSCULAR | Status: DC
  Administered 2014-10-27 – 2014-10-28 (×3): 5000 [IU] via SUBCUTANEOUS
  Filled 2014-10-27 (×4): qty 1

## 2014-10-27 MED ORDER — SODIUM CHLORIDE 0.9 % IV SOLN
62.5000 mg | INTRAVENOUS | Status: DC
Start: 2014-11-01 — End: 2014-10-28

## 2014-10-27 MED ORDER — SODIUM CHLORIDE 0.9 % IJ SOLN
3.0000 mL | Freq: Two times a day (BID) | INTRAMUSCULAR | Status: DC
  Administered 2014-10-28: 3 mL via INTRAVENOUS

## 2014-10-27 MED ORDER — ONDANSETRON HCL 4 MG/2ML IJ SOLN: 4.0000 mg | Freq: Four times a day (QID) | INTRAMUSCULAR | Status: DC | PRN

## 2014-10-27 MED ORDER — SODIUM CHLORIDE 0.9 % IV SOLN: INTRAVENOUS | Status: DC

## 2014-10-27 MED ORDER — CALCITRIOL 0.5 MCG PO CAPS
1.7500 ug | ORAL_CAPSULE | ORAL | Status: DC
  Filled 2014-10-27: qty 1

## 2014-10-27 MED ORDER — HYDRALAZINE HCL 50 MG PO TABS
100.0000 mg | ORAL_TABLET | Freq: Two times a day (BID) | ORAL | Status: DC
  Administered 2014-10-27 – 2014-10-28 (×3): 100 mg via ORAL
  Filled 2014-10-27 (×4): qty 2

## 2014-10-27 MED ORDER — RENA-VITE PO TABS
1.0000 | ORAL_TABLET | Freq: Every day | ORAL | Status: DC
  Administered 2014-10-27 – 2014-10-28 (×2): 1 via ORAL
  Filled 2014-10-27 (×2): qty 1

## 2014-10-27 MED ORDER — SIMVASTATIN 5 MG PO TABS
5.0000 mg | ORAL_TABLET | Freq: Every day | ORAL | Status: DC
  Administered 2014-10-27: 5 mg via ORAL
  Filled 2014-10-27 (×2): qty 1

## 2014-10-27 MED ORDER — ASPIRIN EC 81 MG PO TBEC
81.0000 mg | DELAYED_RELEASE_TABLET | Freq: Every day | ORAL | Status: DC
  Administered 2014-10-27 – 2014-10-28 (×2): 81 mg via ORAL
  Filled 2014-10-27 (×2): qty 1

## 2014-10-27 MED ORDER — NITROGLYCERIN 0.4 MG SL SUBL
0.4000 mg | SUBLINGUAL_TABLET | SUBLINGUAL | Status: DC | PRN
Start: 2014-10-27 — End: 2014-10-28

## 2014-10-27 MED ORDER — DOCUSATE SODIUM 100 MG PO CAPS
100.0000 mg | ORAL_CAPSULE | Freq: Every day | ORAL | Status: DC
Start: 2014-10-27 — End: 2014-10-28
  Administered 2014-10-27 – 2014-10-28 (×2): 100 mg via ORAL
  Filled 2014-10-27 (×2): qty 1

## 2014-10-27 MED ORDER — ONDANSETRON HCL 4 MG/2ML IJ SOLN: 4.0000 mg | Freq: Three times a day (TID) | INTRAMUSCULAR | Status: DC | PRN

## 2014-10-27 MED ORDER — HYDROCODONE-ACETAMINOPHEN 10-325 MG PO TABS: 1.0000 | ORAL_TABLET | Freq: Four times a day (QID) | ORAL | Status: DC | PRN

## 2014-10-27 MED ORDER — PANTOPRAZOLE SODIUM 40 MG PO TBEC
40.0000 mg | DELAYED_RELEASE_TABLET | Freq: Every day | ORAL | Status: DC
  Administered 2014-10-27 – 2014-10-28 (×2): 40 mg via ORAL
  Filled 2014-10-27 (×2): qty 1

## 2014-10-27 MED ORDER — INSULIN ASPART 100 UNIT/ML ~~LOC~~ SOLN
0.0000 [IU] | Freq: Three times a day (TID) | SUBCUTANEOUS | Status: DC
  Administered 2014-10-27: 1 [IU] via SUBCUTANEOUS

## 2014-10-27 MED ORDER — MOMETASONE FURO-FORMOTEROL FUM 100-5 MCG/ACT IN AERO
2.0000 | INHALATION_SPRAY | Freq: Two times a day (BID) | RESPIRATORY_TRACT | Status: DC
  Administered 2014-10-27 – 2014-10-28 (×2): 2 via RESPIRATORY_TRACT
  Filled 2014-10-27: qty 8.8

## 2014-10-27 MED ORDER — TRAMADOL HCL 50 MG PO TABS
50.0000 mg | ORAL_TABLET | Freq: Four times a day (QID) | ORAL | Status: DC | PRN
  Administered 2014-10-27 (×2): 50 mg via ORAL
  Filled 2014-10-27 (×3): qty 1

## 2014-10-27 MED ORDER — ALBUTEROL SULFATE (2.5 MG/3ML) 0.083% IN NEBU: 2.5000 mg | INHALATION_SOLUTION | Freq: Four times a day (QID) | RESPIRATORY_TRACT | Status: DC | PRN

## 2014-10-27 MED ORDER — MORPHINE SULFATE 2 MG/ML IJ SOLN: 2.0000 mg | INTRAMUSCULAR | Status: DC | PRN

## 2014-10-27 MED ORDER — SODIUM CHLORIDE 0.9 % IJ SOLN
10.0000 mL | INTRAMUSCULAR | Status: DC | PRN
  Administered 2014-10-27 (×4): 10 mL
  Administered 2014-10-28: 05:00:00 30 mL
  Filled 2014-10-27 (×4): qty 40

## 2014-10-27 MED ORDER — ISOSORBIDE MONONITRATE ER 60 MG PO TB24
180.0000 mg | ORAL_TABLET | Freq: Every day | ORAL | Status: DC
  Administered 2014-10-27 – 2014-10-28 (×2): 180 mg via ORAL
  Filled 2014-10-27 (×2): qty 3

## 2014-10-27 MED ORDER — SODIUM CHLORIDE 0.9 % IV SOLN
INTRAVENOUS | Status: AC
  Administered 2014-10-27: 10:00:00 via INTRAVENOUS

## 2014-10-27 NOTE — Progress Notes (Signed)
Patient arrived on unit from [facility], transported by [employer].  Vital signs stable, patient alert and oriented x4, daughter at bedside.  Dr. Blaine Hamper made aware of patient's arrival to unit.  Will await admission orders and continue to monitor.

## 2014-10-27 NOTE — Progress Notes (Signed)
Patient Demographics  Annette Roach, is a 81 y.o. female, DOB - [birth year], NLZ:[id number]  Admit date - 10/27/2014   Admitting Physician Ivor Costa, MD  Outpatient Primary MD for the patient is Gilford Rile, MD  LOS - 0   No chief complaint on file.      Summary  Annette Roach is a 81 y.o. female with past medical history of congestive heart failure, hypertension, GERD, hyperlipidemia, asthma, COPD, diabetes mellitus, end-stage renal disease on dialysis (Tuesday/Thursday/Saturday), atrial fibrillation (only on aspirin), who presents with the abdominal pain and chest pain.  Patient is transferred from [facility] per Dr. Wilder Glade. Patient presented to the [facility], with initial presentation of chest pain and altered mental status. Per Dr. Wilder Glade, she has intermittent mild chest pain in the past month. She seems to have altered mental status and confusion recently. She was found to have fever of 102.2, and shortness of breath. They did ABG which showed pH 7.49, PCO2 47, PO2 65%. Patient did not have cough. Patient was suspected to have severe sepsis in [facility]. When patient comes to the floor, her chest has resolved. Her temperature is normal. Patient even does not remember that she had chest pain.   Patient also complains of abdominal pain which also resolved currently. She reports that she has mild abdominal pain and nausea, no vomiting or diarrhea. Currently patient does not have any abdominal pain. She is concerned that her abdominal pain may come back. Of note, she reports that she was diagnosed as GIST (GI stromal tumor?) at about 3 weeks ago. No surgery was done yet (no document available now).  Currently, patient denies fever, chills, headaches, cough, chest pain, SOB, abdominal  pain, diarrhea, constipation, dysuria, urgency, frequency, hematuria, skin rashes, joint pain or leg swelling. No unilateral weakness, numbness or tingling sensations. No vision change or hearing loss.  In [facility], CT-head was negative for acute abnormalities. Chest x-ray was negative for infiltration, but showed mild pulmonary edema. CT angiogram was negative for infiltration and pulmonary embolism. WBC 10.6, troponin negative, potassium 5.2, BUN 22, creatinine 7.6, patient was 102.2 rectally (currently 98.2). EKG showed first degree AV block and mild T-wave inversion in inferior leads, which is similar to previous EKG on 07/25/14. Patient is admitted to inpatient for further evaluation or treatment.   Subjective:   [employer] today has, No headache, No chest pain, No abdominal pain - No Nausea, No new weakness tingling or numbness, No Cough - SOB.    Assessment & Plan    1. Abdominal pain. 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 Currently completely symptom free, exam benign, lipase unremarkable, labwork stable. We will advance diet, place on PPI. Supportive care. If symptom-free discharge in the morning. We will follow with her surgeon at [facility] for definitive surgery.   2. ESRD. Tuesday Thursday Saturday dialysis schedule, renal consulted.   3. Atypical chest pain. EKG nonacute, pain resolved, on aspirin and statin, pain-free. Cycle troponin. No beta blocker due to first-degree AV block on EKG.    4. Dyslipidemia. On statin continue.    5. Essential hypertension. On Imdur, hydralazine continue to monitor.     Code Status: Full  Family Communication: none  Disposition Plan:  Home   Procedures     Consults  Renal   Medications  Scheduled Meds: . aspirin EC  81 mg Oral Daily  . calcitRIOL  1.75 mcg Oral Q T,Th,Sa-HD  . docusate sodium  100 mg Oral Daily  . [START ON 11/01/2014] ferric gluconate (FERRLECIT/NULECIT) IV  62.5 mg  Intravenous Weekly  . heparin  5,000 Units Subcutaneous 3 times per day  . hydrALAZINE  100 mg Oral BID  . insulin aspart  0-9 Units Subcutaneous TID WC  . isosorbide mononitrate  180 mg Oral Daily  . mometasone-formoterol  2 puff Inhalation BID  . multivitamin  1 tablet Oral Daily  . pantoprazole  40 mg Oral Daily  . simvastatin  5 mg Oral QHS  . sodium chloride  3 mL Intravenous Q12H   Continuous Infusions: . sodium chloride 75 mL/hr at 10/27/14 0947   PRN Meds:.albuterol, HYDROcodone-acetaminophen, morphine injection, nitroGLYCERIN, ondansetron **OR** ondansetron (ZOFRAN) IV, sodium chloride, traMADol  DVT Prophylaxis    Heparin   Lab Results  Component Value Date   PLT 331 10/27/2014    Antibiotics     Anti-infectives    None          Objective:   Filed Vitals:   10/27/14 0405 10/27/14 1012  BP: 121/55 119/48  Pulse: 88   Temp: 98.2 F (36.8 C)   TempSrc: Oral   Resp: 18   Height: 5\' 2"  (1.575 m)   Weight: 77.248 kg (170 lb 4.8 oz)   SpO2: 100%     Wt Readings from Last 3 Encounters:  10/27/14 77.248 kg (170 lb 4.8 oz)  10/15/14 80 kg (176 lb 5.9 oz)  05/11/13 77.248 kg (170 lb 4.8 oz)     Intake/Output Summary (Last 24 hours) at 10/27/14 1147 Last data filed at 10/27/14 0543  Gross per 24 hour  Intake      0 ml  Output      0 ml  Net      0 ml     Physical Exam  Awake Alert, Oriented X 3, No new F.N deficits, Normal affect [place].AT,PERRAL Supple Neck,No JVD, No cervical lymphadenopathy appriciated.  Symmetrical Chest wall movement, Good air movement bilaterally, CTAB, left IJ central line. RRR,No Gallops,Rubs or new Murmurs, No Parasternal Heave +ve B.Sounds, Abd Soft, No tenderness, No organomegaly appriciated, No rebound - guarding or rigidity. No Cyanosis, Clubbing or edema, No new Rash or bruise      Data Review   Micro Results No results found for this or any previous visit (from the past 240 hour(s)).  Radiology Reports    Dg  Chest Port 1 View  10/27/2014   CLINICAL DATA:  Status post central line placement  EXAM: PORTABLE CHEST - 1 VIEW  COMPARISON:  10/26/2014  FINDINGS: Cardiac shadow is mildly enlarged but stable. A left jugular central venous line is again seen with the catheter tip in the proximal superior vena cava. No pneumothorax is noted. Patchy infiltrative changes are noted in the left base slightly increased from the prior exam.  IMPRESSION: No pneumothorax.  Catheter placement as described.  Increasing left basilar infiltrate.   Electronically Signed   By: Inez Catalina M.D.   On: 10/27/2014 08:20       CBC  Recent Labs Lab 10/27/14 0910  WBC 9.9  HGB 9.8*  HCT 31.4*  PLT 331  MCV 90.2  MCH 28.2  MCHC 31.2  RDW 16.3*    Chemistries   Recent Labs Lab  10/27/14 0910  NA 139  K 4.5  CL 93*  CO2 35*  GLUCOSE 68*  BUN 24*  CREATININE 8.79*  CALCIUM 9.0  AST 24  ALT 10  ALKPHOS 78  BILITOT 0.4   ------------------------------------------------------------------------------------------------------------------ estimated creatinine clearance is 4.5 mL/min (by C-G formula based on Cr of 8.79). ------------------------------------------------------------------------------------------------------------------ No results for input(s): HGBA1C in the last 72 hours. ------------------------------------------------------------------------------------------------------------------ No results for input(s): CHOL, HDL, LDLCALC, TRIG, CHOLHDL, LDLDIRECT in the last 72 hours. ------------------------------------------------------------------------------------------------------------------ No results for input(s): TSH, T4TOTAL, T3FREE, THYROIDAB in the last 72 hours.  Invalid input(s): FREET3 ------------------------------------------------------------------------------------------------------------------ No results for input(s): VITAMINB12, FOLATE, FERRITIN, TIBC, IRON, RETICCTPCT in the last 72  hours.  Coagulation profile  Recent Labs Lab 10/27/14 0910  INR 1.11    No results for input(s): DDIMER in the last 72 hours.  Cardiac Enzymes No results for input(s): CKMB, TROPONINI, MYOGLOBIN in the last 168 hours.  Invalid input(s): CK ------------------------------------------------------------------------------------------------------------------ Invalid input(s): POCBNP     Time Spent in minutes   35   Eliceo Gladu K M.D on 10/27/2014 at 11:47 AM  Between 7am to 7pm - Pager - [phone]  After 7pm go to [email or web address] - password [facility]  [facility]   Office  [phone]

## 2014-10-27 NOTE — Evaluation (Signed)
Physical Therapy Evaluation Patient Details Name: Annette Roach MRN: [id number] DOB: 05/06/1928 Today's Date: 10/27/2014   History of Present Illness  81 y.o. female with past medical history of congestive heart failure, hypertension, GERD, hyperlipidemia, asthma, COPD, diabetes mellitus, end-stage renal disease on dialysis (Tuesday/Thursday/Saturday), atrial fibrillation (only on aspirin), who presents with the abdominal pain and chest pain  Clinical Impression  Patient demonstrates deficits in functional mobility as indicated below. Will need continued skilled PT to address deficits and maximize function. Will see as indicated and progress as tolerated. Recommend HHPT upon initial discharge to ensure safety with mobility at home.    Follow Up Recommendations Home health PT;Supervision for mobility/OOB    Equipment Recommendations  None recommended by PT    Recommendations for Other Services       Precautions / Restrictions Precautions Precautions: Fall Precaution Comments: h/o R ankle fx, reports has been using RW and cam boot  Restrictions Weight Bearing Restrictions: No      Mobility  Bed Mobility Overal bed mobility: Needs Assistance Bed Mobility: Supine to Sit     Supine to sit: Supervision     General bed mobility comments: increased time to perform  Transfers Overall transfer level: Needs assistance Equipment used: Rolling walker (2 wheeled) Transfers: Sit to/from [facility] Sit to Stand: Mod assist Stand pivot transfers: Min assist       General transfer comment: assist for elevation to standing, patient able to shuffle step, limited Wbing RLE   Ambulation/Gait                Stairs            Wheelchair Mobility    Modified Rankin (Stroke Patients Only)       Balance Overall balance assessment: History of Falls                                           Pertinent Vitals/Pain      Home Living  Family/patient expects to be discharged to:: Private residence Living Arrangements: Children Available Help at Discharge: Family   Home Access: Level entry     Home Layout: One level Home Equipment: Environmental consultant - 2 wheels;Cane - single point;Wheelchair - manual      Prior Function Level of Independence: Needs assistance   Gait / Transfers Assistance Needed: pt reports able to get OOB without assist, but has someone with her when she walks with RW (only ambulates room to room)           Hand Dominance   Dominant Hand: Right    Extremity/Trunk Assessment   Upper Extremity Assessment: Generalized weakness           Lower Extremity Assessment: Generalized weakness;RLE deficits/detail RLE Deficits / Details: H/o RLE ankle fx    Cervical / Trunk Assessment:  (increased body habitus)  Communication      Cognition Arousal/Alertness: Awake/alert Behavior During Therapy: Flat affect Overall Cognitive Status: No family/caregiver present to determine baseline cognitive functioning                      General Comments      Exercises        Assessment/Plan    PT Assessment Patient needs continued PT services  PT Diagnosis Difficulty walking;Abnormality of gait;Generalized weakness;Acute pain   PT Problem List Decreased strength;Decreased activity tolerance;Decreased balance;Decreased mobility;Decreased  coordination;Decreased cognition;Cardiopulmonary status limiting activity  PT Treatment Interventions DME instruction;Gait training;Functional mobility training;Therapeutic activities;Therapeutic exercise;Balance training;Patient/family education   PT Goals (Current goals can be found in the Care Plan section) Acute Rehab PT Goals Patient Stated Goal: to go home PT Goal Formulation: With patient Time For Goal Achievement: 11/10/14 Potential to Achieve Goals: Good    Frequency Min 3X/week   Barriers to discharge        Co-evaluation                End of Session Equipment Utilized During Treatment: Gait belt;Oxygen Activity Tolerance: Patient limited by fatigue;Patient limited by pain Patient left: in chair;with call bell/phone within reach Nurse Communication: Mobility status         Time: 0321-2248 PT Time Calculation (min) (ACUTE ONLY): 20 min   Charges:   PT Evaluation $Initial PT Evaluation Tier I: 1 Procedure     PT G CodesDuncan Dull 03-Nov-2014, 1:11 PM  Alben Deeds, [place] DPT  [phone]

## 2014-10-27 NOTE — H&P (Signed)
[facility] History and Physical  Annette Roach GXQ:[id number] DOB: 1927-10-09 DOA: 10/27/2014  Referring physician: ED physician PCP: Gilford Rile, MD  Specialists:   Chief Complaint: Abdominal pain and chest pain  HPI: Annette Roach is a 81 y.o. female with past medical history of congestive heart failure, hypertension, GERD, hyperlipidemia, asthma, COPD, diabetes mellitus, end-stage renal disease on dialysis (Tuesday/Thursday/Saturday), atrial fibrillation (only on aspirin), who presents with the abdominal pain and chest pain.  Patient is transferred from [facility] per Dr. Wilder Glade. Patient presented to the [facility], with initial presentation of chest pain and altered mental status. Per Dr. Wilder Glade, she has intermittent mild chest pain in the past month. She seems to have altered mental status and confusion recently. She was found to have fever of 102.2, and shortness of breath. They did ABG which showed pH 7.49, PCO2 47, PO2 65%. Patient did not have cough. Patient was suspected to have severe sepsis in [facility]. When patient comes to the floor, her chest has resolved. Her temperature is normal. Patient even does not remember that she had chest pain.   Patient also complains of abdominal pain which also resolved currently. She reports that she has mild abdominal pain and nausea, no vomiting or diarrhea. Currently patient does not have any abdominal pain. She is concerned that her abdominal pain may come back. Of note, she reports that she was diagnosed as GIST (GI stromal tumor?) at about 3 weeks ago. No surgery was done yet (no document available now).  Currently, patient denies fever, chills, headaches, cough, chest pain, SOB, abdominal pain, diarrhea, constipation, dysuria, urgency, frequency, hematuria, skin rashes, joint pain or leg swelling. No unilateral weakness, numbness or tingling sensations. No vision change or hearing loss.  In [facility], CT-head  was negative for acute abnormalities. Chest x-ray was negative for infiltration, but showed mild pulmonary edema.  CT angiogram was negative for infiltration and pulmonary embolism. WBC 10.6, troponin negative, potassium 5.2, BUN 22, creatinine 7.6, patient was 102.2 rectally (currently 98.2). EKG showed first degree AV block and mild T-wave inversion in inferior leads, which is similar to previous EKG on 07/25/14. Patient is admitted to inpatient for further evaluation or treatment.  Review of Systems: As presented in the history of presenting illness, rest negative.  Where does patient live?  At home Can patient participate in ADLs? some  Allergy: No Known Allergies  Past Medical History  Diagnosis Date  . COPD (chronic obstructive pulmonary disease)     emphysema  . Neuropathy   . Hyperlipidemia   . Diabetes mellitus   . Depression   . Anemia   . Shortness of breath   . GERD (gastroesophageal reflux disease)   . Hyperparathyroidism   . Hypertension     sees Dr. Gilford Rile  . Headache(784.0)   . Arthritis   . CAD (coronary artery disease)     sees Dr. Jenne Campus, [facility] cardiology [place], [place]  . 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 Wears glasses   . Wears hearing aid     right  . Presence of surgically created AV shunt for hemodialysis     old lt upper arm out-rt upper arm shunt in  . Anginal pain     Dr Raliegh Ip in [place], [place]  . History of kidney stones   . Constipation   . History of blood transfusion   . Asthma     years ago  . Chronic  kidney disease     [facility] , [place]- Hemo    Past Surgical History  Procedure Laterality Date  . Abdominal hysterectomy  1976  . Kidney stone surgery  2013  . Av fistula placement  01/20/2012    Procedure: ARTERIOVENOUS (AV) FISTULA CREATION;  Surgeon: Elam Dutch, MD;  Location: [place];  Service: Vascular;  Laterality: Left;  . Hemodialysis catheter  05/25/12    secondary to failed AVF    . Eye surgery      bilateral cataract removed  . Av fistula placement  09/05/2012    Procedure: INSERTION OF ARTERIOVENOUS (AV) GORE-TEX GRAFT ARM;  Surgeon: Elam Dutch, MD;  Location: [facility] OR;  Service: Vascular;  Laterality: Left;  Using 4-34mm x 45 cm Vascular stretch goretex graft  . Ligation of arteriovenous  fistula  09/05/2012    Procedure: LIGATION OF ARTERIOVENOUS  FISTULA;  Surgeon: Elam Dutch, MD;  Location: [place];  Service: Vascular;  Laterality: Left;  . Ligation arteriovenous gortex graft  09/05/2012    Procedure: LIGATION ARTERIOVENOUS GORTEX GRAFT;  Surgeon: Elam Dutch, MD;  Location: [place];  Service: Vascular;  Laterality: Left;  . Av fistula placement Right 11/07/2012    Procedure: ARTERIOVENOUS (AV) FISTULA CREATION;  Surgeon: Elam Dutch, MD;  Location: [facility] OR;  Service: Vascular;  Laterality: Right;  Bascilic Vein Fistula  . Appendectomy    . Carpal tunnel release Left 02/10/2013    Procedure: CARPAL TUNNEL RELEASE;  Surgeon: Cammie Sickle., MD;  Location: [place];  Service: Orthopedics;  Laterality: Left;  . Bascilic vein transposition Right 03/15/2013    Procedure: 2ND STAGE BASCILIC VEIN TRANSPOSITION - RIGHT ARM;  Surgeon: Elam Dutch, MD;  Location: [place];  Service: Vascular;  Laterality: Right;  . Av fistula placement Right 05/15/2013    Procedure: INSERTION OF ARTERIOVENOUS (AV) GORE-TEX GRAFT ARM-RIGHT;  Surgeon: Elam Dutch, MD;  Location: [place];  Service: Vascular;  Laterality: Right;  . Shuntogram N/A 07/27/2012    Procedure: Earney Mallet;  Surgeon: Rosetta Posner, MD;  Location: [facility] CATH LAB;  Service: Cardiovascular;  Laterality: N/A;    Social History:  reports that she has never smoked. She has never used smokeless tobacco. She reports that she does not drink alcohol or use illicit drugs.  Family History:  Family History  Problem Relation Age of Onset  . Diabetes Mother   . Cancer Father   . Deep vein thrombosis  Brother   . Hypertension Brother      Prior to Admission medications   Medication Sig Start Date End Date Taking? Authorizing Provider  albuterol (PROVENTIL HFA;VENTOLIN HFA) 108 (90 BASE) MCG/ACT inhaler Inhale 2 puffs into the lungs every 6 (six) hours as needed. For shortness of breath    Historical Provider, MD  albuterol (PROVENTIL) (2.5 MG/3ML) 0.083% nebulizer solution Take 2.5 mg by nebulization every 6 (six) hours as needed for wheezing or shortness of breath.    Historical Provider, MD  aspirin EC 81 MG tablet Take 81 mg by mouth daily.    Historical Provider, MD  calcium acetate (PHOSLO) 667 MG capsule Take 667 mg by mouth 3 (three) times daily with meals.     Historical Provider, MD  docusate sodium (COLACE) 100 MG capsule Take 100 mg by mouth daily.    Historical Provider, MD  fluticasone (FLONASE) 50 MCG/ACT nasal spray Place 1 spray into both nostrils daily.    Historical Provider, MD  gabapentin (NEURONTIN) 300 MG capsule Take 300 mg by mouth at bedtime.     Historical Provider, MD  hydrALAZINE (APRESOLINE) 100 MG tablet Take 100 mg by mouth 2 (two) times daily.    Historical Provider, MD  HYDROcodone-acetaminophen (NORCO) 10-325 MG per tablet Take 1 tablet by mouth every 6 (six) hours as needed for severe pain. 10/15/14   Carmin Muskrat, MD  isosorbide mononitrate (IMDUR) 60 MG 24 hr tablet Take 180 mg by mouth daily.    Historical Provider, MD  linagliptin (TRADJENTA) 5 MG TABS tablet Take 5 mg by mouth daily.    Historical Provider, MD  mometasone-formoterol (DULERA) 100-5 MCG/ACT AERO Inhale 2 puffs into the lungs 2 (two) times daily.    Historical Provider, MD  multivitamin (RENA-VIT) TABS tablet Take 1 tablet by mouth daily.    Historical Provider, MD  nitroGLYCERIN (NITROSTAT) 0.4 MG SL tablet Place 0.4 mg under the tongue every 5 (five) minutes as needed for chest pain.    Historical Provider, MD  pantoprazole (PROTONIX) 40 MG tablet Take 40 mg by mouth daily.     Historical Provider, MD  polyethylene glycol (MIRALAX / GLYCOLAX) packet Take 17 g by mouth daily.    Historical Provider, MD  simvastatin (ZOCOR) 5 MG tablet Take 5 mg by mouth at bedtime.     Historical Provider, MD    Physical Exam: Filed Vitals:   10/27/14 0405  BP: 121/55  Pulse: 88  Temp: 98.2 F (36.8 C)  TempSrc: Oral  Resp: 18  Height: 5\' 2"  (1.575 m)  Weight: 77.248 kg (170 lb 4.8 oz)  SpO2: 100%   General: Not in acute distress HEENT:       Eyes: PERRL, EOMI, no scleral icterus       ENT: No discharge from the ears and nose, no pharynx injection, no tonsillar enlargement.        Neck: No JVD, no bruit, no mass felt. Cardiac: S1/S2, RRR, No murmurs, No gallops or rubs Pulm: Good air movement bilaterally. Clear to auscultation bilaterally. No rales, wheezing, rhonchi or rubs. Abd: Soft, nondistended, nontender, no rebound pain, no organomegaly, BS present Ext: No edema bilaterally. 2+DP/PT pulse bilaterally Musculoskeletal: No joint deformities, erythema, or stiffness, ROM full Skin: No rashes.  Neuro: Alert and oriented X3, cranial nerves II-XII grossly intact, muscle strength 5/5 in all extremeties, sensation to light touch intact. Brachial reflex 2+ bilaterally. Knee reflex 1+ bilaterally. Negative Babinski's sign. Normal finger to nose test. Psych: Patient is not psychotic, no suicidal or hemocidal ideation.  Labs on Admission:  Basic Metabolic Panel: No results for input(s): NA, K, CL, CO2, GLUCOSE, BUN, CREATININE, CALCIUM, MG, PHOS in the last 168 hours. Liver Function Tests: No results for input(s): AST, ALT, ALKPHOS, BILITOT, PROT, ALBUMIN in the last 168 hours. No results for input(s): LIPASE, AMYLASE in the last 168 hours. No results for input(s): AMMONIA in the last 168 hours. CBC: No results for input(s): WBC, NEUTROABS, HGB, HCT, MCV, PLT in the last 168 hours. Cardiac Enzymes: No results for input(s): CKTOTAL, CKMB, CKMBINDEX, TROPONINI in the last  168 hours.  BNP (last 3 results) No results for input(s): BNP in the last 8760 hours.  ProBNP (last 3 results) No results for input(s): PROBNP in the last 8760 hours.  CBG:  Recent Labs Lab 10/27/14 0607  GLUCAP 138*    Radiological Exams on Admission: No results found.  EKG: Independently reviewed.   Assessment/Plan Principal Problem:   Abdominal pain Active Problems:  End stage renal disease on dialysis   Depression   GERD (gastroesophageal reflux disease)   Hypertension   CAD (coronary artery disease)   Steal syndrome of hand   History of kidney stones   Asthma   Chest pain   CHF (congestive heart failure)  Abdominal pain: It has resolved completely. It was likely due to "GIST (GI stromal tumor)?". The patient does not have fever or chills. -Admitted to the telemetry bed give history of congestive heart failure and A. Fib -Observe patient closely for new symptoms -Blood culture 2 given episode of fever -Check lipase -Need to get medical records from [facility] about GIST in AM  Chest pain: It has resolved. No pulmonary embolism and infiltration on CT angiogram.  -Troponin 3 -repeat EKG -Aspirin -Imdur -When necessary nitroglycerin -Zocor  GERD: -Protonix  ESRD-HD(T/T/S): BUN 22, creatinine 7.6, potassium 5.2 -left message to renal for HD  Hypertension: -Continue hydralazine  Congestive heart failure: No 2D echo data available, not sure which type of CHF. Patient is euvolemic on admission. She is not on diuretics at home. -Observe patient closely for volume status.   DVT ppx: SQ Heparin    Code Status: Full code Family Communication: None at bed side.       Disposition Plan: Admit to inpatient   Date of Service 10/27/2014    Ivor Costa [facility] Pager [phone]  If 7PM-7AM, please contact night-coverage [email or web address] Password [facility] 10/27/2014, 6:34 AM

## 2014-10-27 NOTE — Consult Note (Signed)
Indication for Consultation:  Management of ESRD/hemodialysis; anemia, hypertension/volume and secondary hyperparathyroidism  HPI: Annette Roach is a 81 y.o. female who was transferred from [place] with chest pain and altered mental status- however pt now denies ever having any chest pain and reports having abdominal pain, which is now resolved. She [facility].history of CHF, HTN and DM.  She has not been eating much, d/t no appetite but has not been having any abd pain. 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  [facility] hospital Head CT was negative for acute abnormalities, Chest xray mild pulm edema, has been under edw and will need to be lowered at DC. Will arrange for HD today.   Past Medical History  Diagnosis Date  . COPD (chronic obstructive pulmonary disease)     emphysema  . Neuropathy   . Hyperlipidemia   . Diabetes mellitus   . Depression   . Anemia   . Shortness of breath   . GERD (gastroesophageal reflux disease)   . Hyperparathyroidism   . Hypertension     sees Dr. Gilford Rile  . Headache(784.0)   . Arthritis   . CAD (coronary artery disease)     sees Dr. Jenne Campus, [facility]ology [facility], [place]  . 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 Wears glasses   . Wears hearing aid     right  . Presence of surgically created AV shunt for hemodialysis     old lt upper arm out-rt upper arm shunt in  . Anginal pain     Dr Raliegh Ip in [place], [place]  . History of kidney stones   . Constipation   . History of blood transfusion   . Asthma     years ago  . Chronic kidney disease     T[facility] [place] [place]- Hemo   Past Surgical History  Procedure Laterality Date  . Abdominal hysterectomy  1976  . Kidney stone surgery  2013  . Av fistula placement  01/20/2012    Procedure: ARTERIOVENOUS (AV) FISTULA CREATION;  Surgeon: Elam Dutch, MD;  Location: [place];  Service:  Vascular;  Laterality: Left;  . Hemodialysis catheter  05/25/12    secondary to failed AVF   . Eye surgery      bilateral cataract removed  . Av fistula placement  09/05/2012    Procedure: INSERTION OF ARTERIOVENOUS (AV) GORE-TEX GRAFT ARM;  Surgeon: Elam Dutch, MD;  Location: [facility] OR;  Service: Vascular;  Laterality: Left;  Using 4-77mm x 45 cm Vascular stretch goretex graft  . Ligation of arteriovenous  fistula  09/05/2012    Procedure: LIGATION OF ARTERIOVENOUS  FISTULA;  Surgeon: Elam Dutch, MD;  Location: [place];  Service: Vascular;  Laterality: Left;  . Ligation arteriovenous gortex graft  09/05/2012    Procedure: LIGATION ARTERIOVENOUS GORTEX GRAFT;  Surgeon: Elam Dutch, MD;  Location: [place];  Service: Vascular;  Laterality: Left;  . Av fistula placement Right 11/07/2012    Procedure: ARTERIOVENOUS (AV) FISTULA CREATION;  Surgeon: Elam Dutch, MD;  Location: [facility] OR;  Service: Vascular;  Laterality: Right;  Bascilic Vein Fistula  . Appendectomy    . Carpal tunnel release Left 02/10/2013    Procedure: CARPAL TUNNEL RELEASE;  Surgeon: Cammie Sickle., MD;  Location: [place];  Service: Orthopedics;  Laterality: Left;  . Bascilic vein transposition  Right 03/15/2013    Procedure: 2ND STAGE BASCILIC VEIN TRANSPOSITION - RIGHT ARM;  Surgeon: Elam Dutch, MD;  Location: [place];  Service: Vascular;  Laterality: Right;  . Av fistula placement Right 05/15/2013    Procedure: INSERTION OF ARTERIOVENOUS (AV) GORE-TEX GRAFT ARM-RIGHT;  Surgeon: Elam Dutch, MD;  Location: [place];  Service: Vascular;  Laterality: Right;  . Shuntogram N/A 07/27/2012    Procedure: Earney Mallet;  Surgeon: Rosetta Posner, MD;  Location: [facility] CATH LAB;  Service: Cardiovascular;  Laterality: N/A;   Family History  Problem Relation Age of Onset  . Diabetes Mother   . Cancer Father   . Deep vein thrombosis Brother   . Hypertension Brother    Social History:  reports that she has never  smoked. She has never used smokeless tobacco. She reports that she does not drink alcohol or use illicit drugs. No Known Allergies Prior to Admission medications   Medication Sig Start Date End Date Taking? Authorizing Provider  albuterol (PROVENTIL HFA;VENTOLIN HFA) 108 (90 BASE) MCG/ACT inhaler Inhale 2 puffs into the lungs every 6 (six) hours as needed. For shortness of breath    Historical Provider, MD  albuterol (PROVENTIL) (2.5 MG/3ML) 0.083% nebulizer solution Take 2.5 mg by nebulization every 6 (six) hours as needed for wheezing or shortness of breath.    Historical Provider, MD  aspirin EC 81 MG tablet Take 81 mg by mouth daily.    Historical Provider, MD  calcium acetate (PHOSLO) 667 MG capsule Take 667 mg by mouth 3 (three) times daily with meals.     Historical Provider, MD  docusate sodium (COLACE) 100 MG capsule Take 100 mg by mouth daily.    Historical Provider, MD  fluticasone (FLONASE) 50 MCG/ACT nasal spray Place 1 spray into both nostrils daily.    Historical Provider, MD  gabapentin (NEURONTIN) 300 MG capsule Take 300 mg by mouth at bedtime.     Historical Provider, MD  hydrALAZINE (APRESOLINE) 100 MG tablet Take 100 mg by mouth 2 (two) times daily.    Historical Provider, MD  HYDROcodone-acetaminophen (NORCO) 10-325 MG per tablet Take 1 tablet by mouth every 6 (six) hours as needed for severe pain. 10/15/14   Carmin Muskrat, MD  isosorbide mononitrate (IMDUR) 60 MG 24 hr tablet Take 180 mg by mouth daily.    Historical Provider, MD  linagliptin (TRADJENTA) 5 MG TABS tablet Take 5 mg by mouth daily.    Historical Provider, MD  mometasone-formoterol (DULERA) 100-5 MCG/ACT AERO Inhale 2 puffs into the lungs 2 (two) times daily.    Historical Provider, MD  multivitamin (RENA-VIT) TABS tablet Take 1 tablet by mouth daily.    Historical Provider, MD  nitroGLYCERIN (NITROSTAT) 0.4 MG SL tablet Place 0.4 mg under the tongue every 5 (five) minutes as needed for chest pain.    Historical  Provider, MD  pantoprazole (PROTONIX) 40 MG tablet Take 40 mg by mouth daily.    Historical Provider, MD  polyethylene glycol (MIRALAX / GLYCOLAX) packet Take 17 g by mouth daily.    Historical Provider, MD  simvastatin (ZOCOR) 5 MG tablet Take 5 mg by mouth at bedtime.     Historical Provider, MD   Current Facility-Administered Medications  Medication Dose Route Frequency Provider Last Rate Last Dose  . 0.9 %  sodium chloride infusion   Intravenous Continuous Thurnell Lose, MD 75 mL/hr at 10/27/14 0947    . albuterol (PROVENTIL) (2.5 MG/3ML) 0.083% nebulizer solution 2.5 mg  2.5  mg Nebulization Q6H PRN Ivor Costa, MD      . aspirin EC tablet 81 mg  81 mg Oral Daily Ivor Costa, MD   81 mg at 10/27/14 1013  . calcitRIOL (ROCALTROL) capsule 1.75 mcg  1.75 mcg Oral Q T,Th,Sa-HD Marlena Clipper, NP      . docusate sodium (COLACE) capsule 100 mg  100 mg Oral Daily Ivor Costa, MD   100 mg at 10/27/14 1013  . [START ON 11/01/2014] ferric gluconate (NULECIT) 62.5 mg in sodium chloride 0.9 % 100 mL IVPB  62.5 mg Intravenous Weekly Marlena Clipper, NP      . heparin injection 5,000 Units  5,000 Units Subcutaneous 3 times per day Ivor Costa, MD   5,000 Units at 10/27/14 0609  . hydrALAZINE (APRESOLINE) tablet 100 mg  100 mg Oral BID Ivor Costa, MD   100 mg at 10/27/14 1012  . HYDROcodone-acetaminophen (NORCO) 10-325 MG per tablet 1 tablet  1 tablet Oral Q6H PRN Ivor Costa, MD      . insulin aspart (novoLOG) injection 0-9 Units  0-9 Units Subcutaneous TID WC Ivor Costa, MD   1 Units at 10/27/14 0620  . isosorbide mononitrate (IMDUR) 24 hr tablet 180 mg  180 mg Oral Daily Ivor Costa, MD   180 mg at 10/27/14 1012  . mometasone-formoterol (DULERA) 100-5 MCG/ACT inhaler 2 puff  2 puff Inhalation BID Ivor Costa, MD   2 puff at 10/27/14 0800  . morphine 2 MG/ML injection 2 mg  2 mg Intravenous Q3H PRN Ivor Costa, MD      . multivitamin (RENA-VIT) tablet 1 tablet  1 tablet Oral Daily Ivor Costa, MD   1 tablet at  10/27/14 1011  . nitroGLYCERIN (NITROSTAT) SL tablet 0.4 mg  0.4 mg Sublingual Q5 min PRN Ivor Costa, MD      . ondansetron [facility]) tablet 4 mg  4 mg Oral Q6H PRN Ivor Costa, MD       Or  . ondansetron [facility]) injection 4 mg  4 mg Intravenous Q6H PRN Ivor Costa, MD      . pantoprazole (PROTONIX) EC tablet 40 mg  40 mg Oral Daily Ivor Costa, MD   40 mg at 10/27/14 1011  . simvastatin (ZOCOR) tablet 5 mg  5 mg Oral QHS Ivor Costa, MD      . sodium chloride 0.9 % injection 10-40 mL  10-40 mL Intracatheter PRN Ivor Costa, MD   10 mL at 10/27/14 0448  . sodium chloride 0.9 % injection 3 mL  3 mL Intravenous Q12H Ivor Costa, MD   3 mL at 10/27/14 1013  . traMADol (ULTRAM) tablet 50 mg  50 mg Oral Q6H PRN Thurnell Lose, MD   50 mg at 10/27/14 1100   Labs: Basic Metabolic Panel:  Recent Labs Lab 10/27/14 0910  NA 139  K 4.5  CL 93*  CO2 35*  GLUCOSE 68*  BUN 24*  CREATININE 8.79*  CALCIUM 9.0   Liver Function Tests:  Recent Labs Lab 10/27/14 0910  AST 24  ALT 10  ALKPHOS 78  BILITOT 0.4  PROT 6.8  ALBUMIN 2.8*    Recent Labs Lab 10/27/14 0910  LIPASE 19   No results for input(s): AMMONIA in the last 168 hours. CBC:  Recent Labs Lab 10/27/14 0910  WBC 9.9  HGB 9.8*  HCT 31.4*  MCV 90.2  PLT 331   Cardiac Enzymes:  Recent Labs Lab 10/27/14 1100  TROPONINI 0.06*   CBG:  Recent Labs Lab 10/27/14 0607 10/27/14 1214  GLUCAP 138* 86   Iron Studies: No results for input(s): IRON, TIBC, TRANSFERRIN, FERRITIN in the last 72 hours. Studies/Results: Dg Chest Port 1 View  10/27/2014   CLINICAL DATA:  Status post central line placement  EXAM: PORTABLE CHEST - 1 VIEW  COMPARISON:  10/26/2014  FINDINGS: Cardiac shadow is mildly enlarged but stable. A left jugular central venous line is again seen with the catheter tip in the proximal superior vena cava. No pneumothorax is noted. Patchy infiltrative changes are noted in the left base slightly increased from the prior  exam.  IMPRESSION: No pneumothorax.  Catheter placement as described.  Increasing left basilar infiltrate.   Electronically Signed   By: Inez Catalina M.D.   On: 10/27/2014 08:20     Review of Systems: Reports generalized pain. Denies fever/chills.  Reports abdominal pain now resolved. Denies n/v/d or constipation, denies blood in stool. postive for decreased appetite.  Denies chest pain Positive for sob on exertion - for weeks.  Positive for R ankle tenderness- reports broke her ankle s/p fall about a month ago/cast removed and has boot  Physical Exam: Filed Vitals:   10/27/14 0405 10/27/14 1012  BP: 121/55 119/48  Pulse: 88   Temp: 98.2 F (36.8 C)   TempSrc: Oral   Resp: 18   Height: 5\' 2"  (1.575 m)   Weight: 77.248 kg (170 lb 4.8 oz)   SpO2: 100%      General: Well developed, well nourished, in no acute distress. Head: Normocephalic, atraumatic, sclera non-icteric, mucus membranes are moist Neck: Supple. JVD not elevated. Lungs: Clear bilaterally to auscultation without wheezes, rales, or rhonchi. Dim bases. Breathing is unlabored. Heart: RRR with S1 S2. No murmurs, rubs, or gallops appreciated. Abdomen: Soft, non-tender, non-distended with normoactive bowel sounds. No rebound/guarding. No obvious abdominal masses. M-S:  Strength and tone appear normal for age. Lower extremities:without edema or ischemic changes, no open wounds. R ankle tenderness Neuro: Alert and oriented X 3. Moves all extremities spontaneously. Psych:  Responds to questions appropriately with a normal affect. Dialysis Access: R AVG patent on HD  Dialysis Orders:  TTS [facility] 3:30   76 kgs   2K/2ca 400/1.5 5000 u heparin. R AVG Venofer 50 q week       1.75 calcitriol  Assessment/Plan: 1. Abdominal pain- resolved. Likely d/t GIST blood cultures pending. Follow up with [facility] at [place] 2. Chest pain- resolved. No PE 3. ESRD - TTS [facility]. K+ 4.5 4. Hypertension/volume  - 126/67 on hydralazine under edw at  center, needs lower edw.  5. Anemia  - hgb 9.8, watch CBC. No ESA. Cont weekly Fe 6. Metabolic bone disease -  Ca+ 9. Cont calcitriol 7. Nutrition - alb 2.8 poor appetite. Vit. Clear liq diet.   Shelle Iron, NP [facility] [phone] 10/27/2014, 12:28 PM   Pt seen, examined and agree w A/P as above.  Kelly Splinter MD pager [phone]    cell [phone] 10/27/2014, 5:34 PM

## 2014-10-28 LAB — HEPATITIS B SURFACE ANTIGEN: Hepatitis B Surface Ag: NEGATIVE

## 2014-10-28 LAB — GLUCOSE, CAPILLARY
Glucose-Capillary: 79 mg/dL (ref 70–99)
Glucose-Capillary: 96 mg/dL (ref 70–99)

## 2014-10-28 LAB — TROPONIN I
TROPONIN I: 0.06 ng/mL — AB (ref <0.031)
Troponin I: 0.05 ng/mL — ABNORMAL HIGH (ref <0.031)

## 2014-10-28 MED ORDER — MAGNESIUM SULFATE IN D5W 10-5 MG/ML-% IV SOLN
1.0000 g | Freq: Once | INTRAVENOUS | Status: DC
  Filled 2014-10-28: qty 100

## 2014-10-28 NOTE — Discharge Instructions (Signed)
Follow with Primary MD Gilford Rile, MD in 2 days   Get CBC, CMP, 2 view Chest X ray checked  by Primary MD next visit.    Activity: As tolerated with Full fall precautions use walker/cane & assistance as needed   Disposition Home    Diet: Renal  Check your Weight same time everyday, if you gain over 2 pounds, or you develop in leg swelling, experience more shortness of breath or chest pain, call your Primary MD immediately. Follow Cardiac Low Salt Diet and 1.2 lit/day fluid restriction.   On your next visit with your primary care physician please Get Medicines reviewed and adjusted.   Please request your Prim.MD to go over all Hospital Tests and Procedure/Radiological results at the follow up, please get all Hospital records sent to your Prim MD by signing hospital release before you go home.   If you experience worsening of your admission symptoms, develop shortness of breath, life threatening emergency, suicidal or homicidal thoughts you must seek medical attention immediately by calling 911 or calling your MD immediately  if symptoms less severe.  You Must read complete instructions/literature along with all the possible adverse reactions/side effects for all the Medicines you take and that have been prescribed to you. Take any new Medicines after you have completely understood and accpet all the possible adverse reactions/side effects.   Do not drive, operating heavy machinery, perform activities at heights, swimming or participation in water activities or provide baby sitting services if your were admitted for syncope or siezures until you have seen by Primary MD or a Neurologist and advised to do so again.  Do not drive when taking Pain medications.    Do not take more than prescribed Pain, Sleep and Anxiety Medications  Special Instructions: If you have smoked or chewed Tobacco  in the last 2 yrs please stop smoking, stop any regular Alcohol  and or any Recreational drug  use.  Wear Seat belts while driving.   Please note  You were cared for by a hospitalist during your hospital stay. If you have any questions about your discharge medications or the care you received while you were in the hospital after you are discharged, you can call the unit and asked to speak with the hospitalist on call if the hospitalist that took care of you is not available. Once you are discharged, your primary care physician will handle any further medical issues. Please note that NO REFILLS for any discharge medications will be authorized once you are discharged, as it is imperative that you return to your primary care physician (or establish a relationship with a primary care physician if you do not have one) for your aftercare needs so that they can reassess your need for medications and monitor your lab values.

## 2014-10-28 NOTE — Progress Notes (Signed)
Subjective:   Feels good, no complaints.   Objective Filed Vitals:   10/27/14 2223 10/28/14 0110 10/28/14 0414 10/28/14 0858  BP:  123/44 126/51   Pulse:  97 86   Temp: 98.6 F (37 C) 98 F (36.7 C) 97 F (36.1 C)   TempSrc: Oral Axillary Axillary   Resp:  18 18   Height:      Weight:   76.2 kg (167 lb 15.9 oz)   SpO2:  98% 98% 96%   Physical Exam General: Alert and oriented. No acute distress  Heart: RRR Lungs: CTA, unlabored.  Abdomen: soft, nontender +BS  Extremities: no edema. Mild R ankle tenderness  Dialysis Access: R AVG +b/t  Dialysis Orders: TTS [facility] 3:30 76 kgs 2K/2ca 400/1.5 5000 u heparin. R AVG Venofer 50 q week 1.75 calcitriol  Assessment/Plan: 1. Abdominal pain- resolved. Likely d/t GIST blood cultures no growth to date. Follow up with [facility] [place] 2. Chest pain- resolved. No PE 3. ESRD - TTS [facility]. Next HD Tuesday outpt 4. Hypertension/volume - 126/51on hydralazine under edw at center, needs lower edw.  5. Anemia - hgb 9.8, watch CBC. No ESA. Cont weekly Fe 6. Metabolic bone disease - Ca+ 9. Cont calcitriol 7. Nutrition - alb 2.8 poor appetite. Vit. Renal diet  Shelle Iron, NP [place] [phone] 10/28/2014,10:14 AM  LOS: 1 day   Pt seen, examined and agree w A/P as above.  Kelly Splinter MD pager [phone]    cell [phone] 10/28/2014, 11:17 AM    Additional Objective Labs: Basic Metabolic Panel:  Recent Labs Lab 10/27/14 0910  NA 139  K 4.5  CL 93*  CO2 35*  GLUCOSE 68*  BUN 24*  CREATININE 8.79*  CALCIUM 9.0   Liver Function Tests:  Recent Labs Lab 10/27/14 0910  AST 24  ALT 10  ALKPHOS 78  BILITOT 0.4  PROT 6.8  ALBUMIN 2.8*    Recent Labs Lab 10/27/14 0910  LIPASE 19   CBC:  Recent Labs Lab 10/27/14 0910  WBC 9.9  HGB 9.8*  HCT 31.4*  MCV 90.2  PLT 331   Blood Culture    Component Value Date/Time   SDES BLOOD LEFT HAND 10/27/2014 1123   SPECREQUEST BOTTLES  DRAWN AEROBIC ONLY 10CC 10/27/2014 1123   CULT  10/27/2014 1123           BLOOD CULTURE RECEIVED NO GROWTH TO DATE CULTURE WILL BE HELD FOR 5 DAYS BEFORE ISSUING A FINAL NEGATIVE REPORT Performed at [place] PENDING 10/27/2014 1123    Cardiac Enzymes:  Recent Labs Lab 10/27/14 1100 10/27/14 1902 10/27/14 2327 10/28/14 0530  TROPONINI 0.06* 0.06* 0.05* 0.06*   CBG:  Recent Labs Lab 10/27/14 0607 10/27/14 1214 10/27/14 1653 10/27/14 2213 10/28/14 0533  GLUCAP 138* 86 78 108* 79   Iron Studies: No results for input(s): IRON, TIBC, TRANSFERRIN, FERRITIN in the last 72 hours. @lablastinr3 @ Studies/Results: Dg Chest Port 1 View  10/27/2014   CLINICAL DATA:  Status post central line placement  EXAM: PORTABLE CHEST - 1 VIEW  COMPARISON:  10/26/2014  FINDINGS: Cardiac shadow is mildly enlarged but stable. A left jugular central venous line is again seen with the catheter tip in the proximal superior vena cava. No pneumothorax is noted. Patchy infiltrative changes are noted in the left base slightly increased from the prior exam.  IMPRESSION: No pneumothorax.  Catheter placement as described.  Increasing left basilar infiltrate.   Electronically  Signed   By: Inez Catalina M.D.   On: 10/27/2014 08:20   Medications:   . aspirin EC  81 mg Oral Daily  . calcitRIOL  1.75 mcg Oral Q T,Th,Sa-HD  . docusate sodium  100 mg Oral Daily  . [START ON 11/01/2014] ferric gluconate (FERRLECIT/NULECIT) IV  62.5 mg Intravenous Weekly  . heparin  5,000 Units Subcutaneous 3 times per day  . hydrALAZINE  100 mg Oral BID  . insulin aspart  0-9 Units Subcutaneous TID WC  . isosorbide mononitrate  180 mg Oral Daily  . magnesium sulfate 1 - 4 g bolus IVPB  1 g Intravenous Once  . mometasone-formoterol  2 puff Inhalation BID  . multivitamin  1 tablet Oral Daily  . pantoprazole  40 mg Oral Daily  . simvastatin  5 mg Oral QHS  . sodium chloride  3 mL Intravenous Q12H

## 2014-10-28 NOTE — Progress Notes (Signed)
L CVC removed per order.  Site cleaned with CHG, covered with vaseline guaze and dry 2x2, pressure applied.  Pt verbalizes understanding of site care, dressing care, and when to call doctor via teachback method.  No signs of infection or bleeding present at this time.

## 2014-10-28 NOTE — Discharge Summary (Signed)
Annette Roach, is a 81 y.o. female  DOB 08/26/1928  MRN [id number].  Admission date:  10/27/2014  Admitting Physician  Ivor Costa, MD  Discharge Date:  10/28/2014   Primary MD  Gilford Rile, MD  Recommendations for primary care physician for things to follow:   Monitor clinically, needs to follow closely with oncology and general surgery for stomach cancer.   Admission Diagnosis  ALTERED MENTAL STATUS   Discharge Diagnosis  ALTERED MENTAL STATUS     Principal Problem:   Abdominal pain Active Problems:   End stage renal disease on dialysis   Depression   GERD (gastroesophageal reflux disease)   Hypertension   CAD (coronary artery disease)   Steal syndrome of hand   History of kidney stones   Asthma   Chest pain   CHF (congestive heart failure)      Past Medical History  Diagnosis Date  . COPD (chronic obstructive pulmonary disease)     emphysema  . Neuropathy   . Hyperlipidemia   . Diabetes mellitus   . Depression   . Anemia   . Shortness of breath   . GERD (gastroesophageal reflux disease)   . Hyperparathyroidism   . Hypertension     sees Dr. Gilford Rile  . Headache(784.0)   . Arthritis   . CAD (coronary artery disease)     sees Dr. Jenne Campus, [facility] cardiology [place], [place]  . 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 Wears glasses   . Wears hearing aid     right  . Presence of surgically created AV shunt for hemodialysis     old lt upper arm out-rt upper arm shunt in  . Anginal pain     Dr Raliegh Ip in [place], [place]  . History of kidney stones   . Constipation   . History of blood transfusion   . Asthma     years ago  . Chronic kidney disease     [facility] [place], [place]- Hemo    Past Surgical History  Procedure Laterality Date  . Abdominal hysterectomy   1976  . Kidney stone surgery  2013  . Av fistula placement  01/20/2012    Procedure: ARTERIOVENOUS (AV) FISTULA CREATION;  Surgeon: Elam Dutch, MD;  Location: [place];  Service: Vascular;  Laterality: Left;  . Hemodialysis catheter  05/25/12    secondary to failed AVF   . Eye surgery      bilateral cataract removed  . Av fistula placement  09/05/2012    Procedure: INSERTION OF ARTERIOVENOUS (AV) GORE-TEX GRAFT ARM;  Surgeon: Elam Dutch, MD;  Location: [facility] OR;  Service: Vascular;  Laterality: Left;  Using 4-20mm x 45 cm Vascular stretch goretex graft  . Ligation of arteriovenous  fistula  09/05/2012    Procedure: LIGATION OF ARTERIOVENOUS  FISTULA;  Surgeon: Elam Dutch, MD;  Location: [place];  Service: Vascular;  Laterality: Left;  . Ligation arteriovenous gortex graft  09/05/2012    Procedure:  LIGATION ARTERIOVENOUS GORTEX GRAFT;  Surgeon: Elam Dutch, MD;  Location: [place];  Service: Vascular;  Laterality: Left;  . Av fistula placement Right 11/07/2012    Procedure: ARTERIOVENOUS (AV) FISTULA CREATION;  Surgeon: Elam Dutch, MD;  Location: [facility] OR;  Service: Vascular;  Laterality: Right;  Bascilic Vein Fistula  . Appendectomy    . Carpal tunnel release Left 02/10/2013    Procedure: CARPAL TUNNEL RELEASE;  Surgeon: Cammie Sickle., MD;  Location: [place];  Service: Orthopedics;  Laterality: Left;  . Bascilic vein transposition Right 03/15/2013    Procedure: 2ND STAGE BASCILIC VEIN TRANSPOSITION - RIGHT ARM;  Surgeon: Elam Dutch, MD;  Location: [place];  Service: Vascular;  Laterality: Right;  . Av fistula placement Right 05/15/2013    Procedure: INSERTION OF ARTERIOVENOUS (AV) GORE-TEX GRAFT ARM-RIGHT;  Surgeon: Elam Dutch, MD;  Location: [place];  Service: Vascular;  Laterality: Right;  . Shuntogram N/A 07/27/2012    Procedure: Earney Mallet;  Surgeon: Rosetta Posner, MD;  Location: [facility] CATH LAB;  Service: Cardiovascular;  Laterality: N/A;       History  of present illness and  Hospital Course:     Kindly see H&P for history of present illness and admission details, please review complete Labs, Consult reports and Test reports for all details in brief  HPI  from the history and physical done on the day of admission   Annette Roach is a 81 y.o. female with past medical history of congestive heart failure, hypertension, GERD, hyperlipidemia, asthma, COPD, diabetes mellitus, end-stage renal disease on dialysis (Tuesday/Thursday/Saturday), atrial fibrillation (only on aspirin), who presents with the abdominal pain and chest pain.  Patient is transferred from [facility] per Dr. Wilder Glade. Patient presented to the [facility], with initial presentation of chest pain and altered mental status. Per Dr. Wilder Glade, she has intermittent mild chest pain in the past month. She seems to have altered mental status and confusion recently. She was found to have fever of 102.2, and shortness of breath. They did ABG which showed pH 7.49, PCO2 47, PO2 65%. Patient did not have cough. Patient was suspected to have severe sepsis in [facility]. When patient comes to the floor, her chest has resolved. Her temperature is normal. Patient even does not remember that she had chest pain.   Patient also complains of abdominal pain which also resolved currently. She reports that she has mild abdominal pain and nausea, no vomiting or diarrhea. Currently patient does not have any abdominal pain. She is concerned that her abdominal pain may come back. Of note, she reports that she was diagnosed as GIST (GI stromal tumor?) at about 3 weeks ago. No surgery was done yet (no document available now).  Currently, patient denies fever, chills, headaches, cough, chest pain, SOB, abdominal pain, diarrhea, constipation, dysuria, urgency, frequency, hematuria, skin rashes, joint pain or leg swelling. No unilateral weakness, numbness or tingling sensations. No vision change or hearing  loss.  In [facility], CT-head was negative for acute abnormalities. Chest x-ray was negative for infiltration, but showed mild pulmonary edema. CT angiogram was negative for infiltration and pulmonary embolism. WBC 10.6, troponin negative, potassium 5.2, BUN 22, creatinine 7.6, patient was 102.2 rectally (currently 98.2). EKG showed first degree AV block and mild T-wave inversion in inferior leads, which is similar to previous EKG on 07/25/14. Patient is admitted to inpatient for further evaluation or treatment.   Hospital Course     1.  Abdominal pain. 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 Currently completely symptom free, exam benign, lipase unremarkable, labwork stable. He was treated with supportive care and bowel rest, now tolerating diet without any discomfort, completely symptom free and feeling at her baseline. She is eager to go home,  will continue PPI.Marland Kitchen We will follow with her surgeon at [facility] for definitive surgery. She has an appointment with the surgeon within a week.    2. ESRD. Tuesday Thursday Saturday dialysis schedule, renal consulted and was dialyzed yesterday.    3. Atypical chest pain. EKG nonacute, pain resolved, on aspirin and statin, pain-free. No beta blocker due to first-degree AV block on EKG. troponin 3 sets of 0.06 which is non-ACS pattern and likely due to underlying ESRD, reviewed her troponin and case with cardiologist on call Dr. Charlett Blake who recommends no further testing or change in treatment. Have her follow with cardiology locally in [place] one time after discharge.    4. Dyslipidemia. On statin continue.    5. Essential hypertension. On Imdur, hydralazine continue to monitor.    Discharge Condition: Stable   Follow UP  Follow-up Information    Follow up with GRISSO,GREG, MD. Schedule an appointment as soon as possible for a visit in 2 days.   Specialty:  Internal Medicine   Why:  and your cancer  doctor and your general surgeon   Contact information:   [address] [place] [address] [phone]       Follow up with Follow with your nephrologist. Schedule an appointment as soon as possible for a visit in 1 week.      Follow up with [place] [facility]. Schedule an appointment as soon as possible for a visit in 1 week.   Contact information:   [address] [place] [id number]         Discharge Instructions  and  Discharge Medications      Discharge Instructions    Discharge instructions    Complete by:  As directed   Follow with Primary MD Gilford Rile, MD in 2 days   Get CBC, CMP, 2 view Chest X ray checked  by Primary MD next visit.    Activity: As tolerated with Full fall precautions use walker/cane & assistance as needed   Disposition Home    Diet: Renal  Check your Weight same time everyday, if you gain over 2 pounds, or you develop in leg swelling, experience more shortness of breath or chest pain, call your Primary MD immediately. Follow Cardiac Low Salt Diet and 1.2 lit/day fluid restriction.   On your next visit with your primary care physician please Get Medicines reviewed and adjusted.   Please request your Prim.MD to go over all Hospital Tests and Procedure/Radiological results at the follow up, please get all Hospital records sent to your Prim MD by signing hospital release before you go home.   If you experience worsening of your admission symptoms, develop shortness of breath, life threatening emergency, suicidal or homicidal thoughts you must seek medical attention immediately by calling 911 or calling your MD immediately  if symptoms less severe.  You Must read complete instructions/literature along with all the possible adverse reactions/side effects for all the Medicines you take and that have been prescribed to you. Take any new Medicines after you have completely understood and accpet all the possible adverse  reactions/side effects.   Do not drive, operating heavy machinery, perform activities at heights, swimming or participation  in water activities or provide baby sitting services if your were admitted for syncope or siezures until you have seen by Primary MD or a Neurologist and advised to do so again.  Do not drive when taking Pain medications.    Do not take more than prescribed Pain, Sleep and Anxiety Medications  Special Instructions: If you have smoked or chewed Tobacco  in the last 2 yrs please stop smoking, stop any regular Alcohol  and or any Recreational drug use.  Wear Seat belts while driving.   Please note  You were cared for by a hospitalist during your hospital stay. If you have any questions about your discharge medications or the care you received while you were in the hospital after you are discharged, you can call the unit and asked to speak with the hospitalist on call if the hospitalist that took care of you is not available. Once you are discharged, your primary care physician will handle any further medical issues. Please note that NO REFILLS for any discharge medications will be authorized once you are discharged, as it is imperative that you return to your primary care physician (or establish a relationship with a primary care physician if you do not have one) for your aftercare needs so that they can reassess your need for medications and monitor your lab values.     Increase activity slowly    Complete by:  As directed             Medication List    TAKE these medications        albuterol (2.5 MG/3ML) 0.083% nebulizer solution  Commonly known as:  PROVENTIL  Take 2.5 mg by nebulization every 6 (six) hours as needed for wheezing or shortness of breath.     amLODipine 2.5 MG tablet  Commonly known as:  NORVASC  Take 2.5 mg by mouth daily.     aspirin EC 81 MG tablet  Take 81 mg by mouth daily.     Azelastine HCl 0.15 % Soln     calcium acetate 667 MG  capsule  Commonly known as:  PHOSLO  Take 667 mg by mouth 2 (two) times daily before a meal.     hydrALAZINE 100 MG tablet  Commonly known as:  APRESOLINE  Take 100 mg by mouth 2 (two) times daily.     HYDROcodone-acetaminophen 10-325 MG per tablet  Commonly known as:  NORCO  Take 1 tablet by mouth every 6 (six) hours as needed for severe pain.     isosorbide mononitrate 60 MG 24 hr tablet  Commonly known as:  IMDUR  Take 180 mg by mouth daily.     linagliptin 5 MG Tabs tablet  Commonly known as:  TRADJENTA  Take 5 mg by mouth daily.     mometasone-formoterol 100-5 MCG/ACT Aero  Commonly known as:  DULERA  Inhale 2 puffs into the lungs 2 (two) times daily.     multivitamin Tabs tablet  Take 1 tablet by mouth daily.     nitroGLYCERIN 0.4 MG SL tablet  Commonly known as:  NITROSTAT  Place 0.4 mg under the tongue every 5 (five) minutes as needed for chest pain.     pantoprazole 40 MG tablet  Commonly known as:  PROTONIX  Take 40 mg by mouth daily.     polyethylene glycol packet  Commonly known as:  MIRALAX / GLYCOLAX  Take 17 g by mouth daily.     QVAR 80 MCG/ACT inhaler  Generic  drug:  beclomethasone  Inhale 2 puffs into the lungs 2 (two) times daily.     simvastatin 5 MG tablet  Commonly known as:  ZOCOR  Take 5 mg by mouth at bedtime.     traMADol 50 MG tablet  Commonly known as:  ULTRAM  Take 50 mg by mouth every 6 (six) hours as needed. for pain          Diet and Activity recommendation: See Discharge Instructions above   Consults obtained - Renal for dialysis, cardiologist Dr. Jeffie Pollock over the phone. Reviewed her EKG and clinical scenario.   Major procedures and Radiology Reports - PLEASE review detailed and final reports for all details, in brief -       Dg Ankle Complete Right  10/15/2014   CLINICAL DATA:  Known RIGHT foot fracture from 2 months ago, fell again yesterday, increased RIGHT foot and lateral RIGHT ankle pain  EXAM: RIGHT ANKLE -  COMPLETE 3+ VIEW  COMPARISON:  07/28/2014  FINDINGS: Diffuse osseous demineralization.  Ankle mortise intact.  Subacute/healing oblique lateral malleolar fracture, less distinct than on previous exam.  No additional fracture, dislocation or bone destruction.  Scattered small vessel vascular calcifications.  Plantar calcaneal spur.  IMPRESSION: Osseous demineralization with a healing oblique RIGHT lateral malleolar fracture.  No definite new bony abnormalities.   Electronically Signed   By: Lavonia Dana M.D.   On: 10/15/2014 19:26   Dg Chest Port 1 View  10/27/2014   CLINICAL DATA:  Status post central line placement  EXAM: PORTABLE CHEST - 1 VIEW  COMPARISON:  10/26/2014  FINDINGS: Cardiac shadow is mildly enlarged but stable. A left jugular central venous line is again seen with the catheter tip in the proximal superior vena cava. No pneumothorax is noted. Patchy infiltrative changes are noted in the left base slightly increased from the prior exam.  IMPRESSION: No pneumothorax.  Catheter placement as described.  Increasing left basilar infiltrate.   Electronically Signed   By: Inez Catalina M.D.   On: 10/27/2014 08:20   Dg Foot Complete Right  10/15/2014   CLINICAL DATA:  History of right foot fracture 2 months ago. Patient status post fall 10/14/2014. Right foot pain. Initial encounter.  EXAM: RIGHT FOOT COMPLETE - 3+ VIEW  COMPARISON:  Plain films right foot 10/15/2014 at 2 a.m. 07/28/2014.  FINDINGS: Bones are osteopenic. No fracture or dislocation is identified. No notable arthropathy. Small calcaneal spur is noted. Atherosclerotic vascular disease is identified.  IMPRESSION: No acute abnormality.  Osteopenia.  Atherosclerosis.   Electronically Signed   By: Inge Rise M.D.   On: 10/15/2014 18:51    Micro Results      Recent Results (from the past 240 hour(s))  Culture, blood (routine x 2)     Status: None (Preliminary result)   Collection Time: 10/27/14 11:15 AM  Result Value Ref Range  Status   Specimen Description BLOOD LEFT ARM  Final   Special Requests   Final    BOTTLES DRAWN AEROBIC AND ANAEROBIC 10CC BLUE 5CC RED   Culture   Final           BLOOD CULTURE RECEIVED NO GROWTH TO DATE CULTURE WILL BE HELD FOR 5 DAYS BEFORE ISSUING A FINAL NEGATIVE REPORT Performed at [facility]    Report Status PENDING  Incomplete  Culture, blood (routine x 2)     Status: None (Preliminary result)   Collection Time: 10/27/14 11:23 AM  Result Value Ref Range Status  Specimen Description BLOOD LEFT HAND  Final   Special Requests BOTTLES DRAWN AEROBIC ONLY 10CC  Final   Culture   Final           BLOOD CULTURE RECEIVED NO GROWTH TO DATE CULTURE WILL BE HELD FOR 5 DAYS BEFORE ISSUING A FINAL NEGATIVE REPORT Performed at [facility]    Report Status PENDING  Incomplete       Today   Subjective:   Annette Roach today has no headache,no chest abdominal pain,no new weakness tingling or numbness, feels much better wants to go home today.    Objective:   Blood pressure 126/51, pulse 86, temperature 97 F (36.1 C), temperature source Axillary, resp. rate 18, height 5\' 2"  (1.575 m), weight 76.2 kg (167 lb 15.9 oz), SpO2 96 %.   Intake/Output Summary (Last 24 hours) at 10/28/14 0939 Last data filed at 10/28/14 0837  Gross per 24 hour  Intake    290 ml  Output   1454 ml  Net  -1164 ml    Exam Awake Alert, Oriented x 3, No new F.N deficits, Normal affect [place].AT,PERRAL Supple Neck,No JVD, No cervical lymphadenopathy appriciated.  Symmetrical Chest wall movement, Good air movement bilaterally, CTAB RRR,No Gallops,Rubs or new Murmurs, No Parasternal Heave +ve B.Sounds, Abd Soft, Non tender, No organomegaly appriciated, No rebound -guarding or rigidity. No Cyanosis, Clubbing or edema, No new Rash or bruise  Data Review   CBC w Diff: Lab Results  Component Value Date   WBC 9.9 10/27/2014   HGB 9.8* 10/27/2014   HCT 31.4* 10/27/2014   PLT 331 10/27/2014     CMP: Lab Results  Component Value Date   NA 139 10/27/2014   K 4.5 10/27/2014   CL 93* 10/27/2014   CO2 35* 10/27/2014   BUN 24* 10/27/2014   CREATININE 8.79* 10/27/2014   PROT 6.8 10/27/2014   ALBUMIN 2.8* 10/27/2014   BILITOT 0.4 10/27/2014   ALKPHOS 78 10/27/2014   AST 24 10/27/2014   ALT 10 10/27/2014  .   Total Time in preparing paper work, data evaluation and todays exam - 35 minutes  Thurnell Lose M.D on 10/28/2014 at 9:39 AM  [facility]   Office  [phone]

## 2014-10-29 LAB — HEMOGLOBIN A1C
Hgb A1c MFr Bld: 5 % (ref 4.8–5.6)
Mean Plasma Glucose: 97 mg/dL

## 2014-10-29 NOTE — Progress Notes (Signed)
Utilization review completed. Mikias Lanz, RN, BSN. 

## 2014-11-02 LAB — CULTURE, BLOOD (ROUTINE X 2)
Culture: NO GROWTH
Culture: NO GROWTH

## 2014-11-13 ENCOUNTER — Emergency Department (HOSPITAL_COMMUNITY): Payer: Medicare Other

## 2014-11-13 ENCOUNTER — Inpatient Hospital Stay (HOSPITAL_COMMUNITY)
Admission: EM | Admit: 2014-11-13 | Discharge: 2014-11-20 | DRG: 193 | Disposition: A | Discharge status: Skilled Nursing Facility | Payer: Medicare Other | Attending: Internal Medicine | Admitting: Internal Medicine

## 2014-11-13 ENCOUNTER — Encounter (HOSPITAL_COMMUNITY): Payer: Self-pay | Admitting: *Deleted

## 2014-11-13 DIAGNOSIS — I25119 Atherosclerotic heart disease of native coronary artery with unspecified angina pectoris: Secondary | ICD-10-CM | POA: Diagnosis present

## 2014-11-13 DIAGNOSIS — M1990 Unspecified osteoarthritis, unspecified site: Secondary | ICD-10-CM | POA: Diagnosis present

## 2014-11-13 DIAGNOSIS — Y95 Nosocomial condition: Secondary | ICD-10-CM | POA: Diagnosis present

## 2014-11-13 DIAGNOSIS — I953 Hypotension of hemodialysis: Secondary | ICD-10-CM | POA: Diagnosis not present

## 2014-11-13 DIAGNOSIS — R55 Syncope and collapse: Secondary | ICD-10-CM | POA: Diagnosis present

## 2014-11-13 DIAGNOSIS — Z90710 Acquired absence of both cervix and uterus: Secondary | ICD-10-CM

## 2014-11-13 DIAGNOSIS — Z992 Dependence on renal dialysis: Secondary | ICD-10-CM

## 2014-11-13 DIAGNOSIS — I4510 Unspecified right bundle-branch block: Secondary | ICD-10-CM | POA: Diagnosis present

## 2014-11-13 DIAGNOSIS — N2581 Secondary hyperparathyroidism of renal origin: Secondary | ICD-10-CM | POA: Diagnosis present

## 2014-11-13 DIAGNOSIS — G9340 Encephalopathy, unspecified: Secondary | ICD-10-CM | POA: Diagnosis present

## 2014-11-13 DIAGNOSIS — G253 Myoclonus: Secondary | ICD-10-CM | POA: Diagnosis present

## 2014-11-13 DIAGNOSIS — E119 Type 2 diabetes mellitus without complications: Secondary | ICD-10-CM | POA: Diagnosis present

## 2014-11-13 DIAGNOSIS — I444 Left anterior fascicular block: Secondary | ICD-10-CM | POA: Diagnosis present

## 2014-11-13 DIAGNOSIS — K219 Gastro-esophageal reflux disease without esophagitis: Secondary | ICD-10-CM | POA: Diagnosis present

## 2014-11-13 DIAGNOSIS — J449 Chronic obstructive pulmonary disease, unspecified: Secondary | ICD-10-CM | POA: Diagnosis present

## 2014-11-13 DIAGNOSIS — D72829 Elevated white blood cell count, unspecified: Secondary | ICD-10-CM | POA: Diagnosis not present

## 2014-11-13 DIAGNOSIS — D638 Anemia in other chronic diseases classified elsewhere: Secondary | ICD-10-CM | POA: Diagnosis present

## 2014-11-13 DIAGNOSIS — J45909 Unspecified asthma, uncomplicated: Secondary | ICD-10-CM | POA: Diagnosis present

## 2014-11-13 DIAGNOSIS — I120 Hypertensive chronic kidney disease with stage 5 chronic kidney disease or end stage renal disease: Secondary | ICD-10-CM | POA: Diagnosis present

## 2014-11-13 DIAGNOSIS — R1110 Vomiting, unspecified: Secondary | ICD-10-CM

## 2014-11-13 DIAGNOSIS — J189 Pneumonia, unspecified organism: Secondary | ICD-10-CM | POA: Diagnosis present

## 2014-11-13 DIAGNOSIS — R4182 Altered mental status, unspecified: Secondary | ICD-10-CM

## 2014-11-13 DIAGNOSIS — Z79899 Other long term (current) drug therapy: Secondary | ICD-10-CM

## 2014-11-13 DIAGNOSIS — Z833 Family history of diabetes mellitus: Secondary | ICD-10-CM | POA: Diagnosis not present

## 2014-11-13 DIAGNOSIS — R001 Bradycardia, unspecified: Secondary | ICD-10-CM | POA: Diagnosis not present

## 2014-11-13 DIAGNOSIS — I519 Heart disease, unspecified: Secondary | ICD-10-CM | POA: Diagnosis not present

## 2014-11-13 DIAGNOSIS — E785 Hyperlipidemia, unspecified: Secondary | ICD-10-CM | POA: Diagnosis present

## 2014-11-13 DIAGNOSIS — N186 End stage renal disease: Secondary | ICD-10-CM | POA: Diagnosis present

## 2014-11-13 DIAGNOSIS — Z79891 Long term (current) use of opiate analgesic: Secondary | ICD-10-CM | POA: Diagnosis not present

## 2014-11-13 DIAGNOSIS — K602 Anal fissure, unspecified: Secondary | ICD-10-CM | POA: Diagnosis present

## 2014-11-13 DIAGNOSIS — K5900 Constipation, unspecified: Secondary | ICD-10-CM | POA: Diagnosis present

## 2014-11-13 DIAGNOSIS — Z85028 Personal history of other malignant neoplasm of stomach: Secondary | ICD-10-CM | POA: Diagnosis not present

## 2014-11-13 DIAGNOSIS — K6289 Other specified diseases of anus and rectum: Secondary | ICD-10-CM | POA: Diagnosis present

## 2014-11-13 DIAGNOSIS — Z7982 Long term (current) use of aspirin: Secondary | ICD-10-CM | POA: Diagnosis not present

## 2014-11-13 LAB — CBC WITH DIFFERENTIAL/PLATELET
BASOS ABS: 0 10*3/uL (ref 0.0–0.1)
BASOS PCT: 0 % (ref 0–1)
EOS ABS: 0.1 10*3/uL (ref 0.0–0.7)
Eosinophils Relative: 0 % (ref 0–5)
HEMATOCRIT: 32 % — AB (ref 36.0–46.0)
Hemoglobin: 10.4 g/dL — ABNORMAL LOW (ref 12.0–15.0)
Lymphocytes Relative: 6 % — ABNORMAL LOW (ref 12–46)
Lymphs Abs: 0.8 10*3/uL (ref 0.7–4.0)
MCH: 27.4 pg (ref 26.0–34.0)
MCHC: 32.5 g/dL (ref 30.0–36.0)
MCV: 84.4 fL (ref 78.0–100.0)
MONOS PCT: 5 % (ref 3–12)
Monocytes Absolute: 0.7 10*3/uL (ref 0.1–1.0)
Neutro Abs: 13 10*3/uL — ABNORMAL HIGH (ref 1.7–7.7)
Neutrophils Relative %: 89 % — ABNORMAL HIGH (ref 43–77)
Platelets: 367 10*3/uL (ref 150–400)
RBC: 3.79 MIL/uL — ABNORMAL LOW (ref 3.87–5.11)
RDW: 16.8 % — ABNORMAL HIGH (ref 11.5–15.5)
WBC: 14.6 10*3/uL — ABNORMAL HIGH (ref 4.0–10.5)

## 2014-11-13 LAB — COMPREHENSIVE METABOLIC PANEL
ALT: 22 U/L (ref 0–35)
AST: 28 U/L (ref 0–37)
Albumin: 3 g/dL — ABNORMAL LOW (ref 3.5–5.2)
Alkaline Phosphatase: 75 U/L (ref 39–117)
Anion gap: 9 (ref 5–15)
BUN: 14 mg/dL (ref 6–23)
CALCIUM: 9.2 mg/dL (ref 8.4–10.5)
CO2: 34 mmol/L — ABNORMAL HIGH (ref 19–32)
Chloride: 94 mmol/L — ABNORMAL LOW (ref 96–112)
Creatinine, Ser: 5.82 mg/dL — ABNORMAL HIGH (ref 0.50–1.10)
GFR calc non Af Amer: 6 mL/min — ABNORMAL LOW (ref >90)
GFR, EST AFRICAN AMERICAN: 7 mL/min — AB (ref >90)
Glucose, Bld: 161 mg/dL — ABNORMAL HIGH (ref 70–99)
Potassium: 3.7 mmol/L (ref 3.5–5.1)
SODIUM: 137 mmol/L (ref 135–145)
TOTAL PROTEIN: 8.8 g/dL — AB (ref 6.0–8.3)
Total Bilirubin: 1 mg/dL (ref 0.3–1.2)

## 2014-11-13 LAB — TROPONIN I: Troponin I: 0.03 ng/mL (ref <0.031)

## 2014-11-13 LAB — CBG MONITORING, ED: Glucose-Capillary: 127 mg/dL — ABNORMAL HIGH (ref 70–99)

## 2014-11-13 MED ORDER — POLYETHYLENE GLYCOL 3350 17 G PO PACK
17.0000 g | PACK | Freq: Every day | ORAL | Status: DC
  Administered 2014-11-14 – 2014-11-20 (×5): 17 g via ORAL
  Filled 2014-11-13 (×7): qty 1

## 2014-11-13 MED ORDER — PANTOPRAZOLE SODIUM 40 MG PO TBEC
40.0000 mg | DELAYED_RELEASE_TABLET | Freq: Every day | ORAL | Status: DC
  Administered 2014-11-14 – 2014-11-20 (×6): 40 mg via ORAL
  Filled 2014-11-13 (×5): qty 1

## 2014-11-13 MED ORDER — TRAMADOL HCL 50 MG PO TABS
50.0000 mg | ORAL_TABLET | Freq: Four times a day (QID) | ORAL | Status: DC | PRN
  Administered 2014-11-15 – 2014-11-19 (×2): 50 mg via ORAL
  Filled 2014-11-13 (×2): qty 1

## 2014-11-13 MED ORDER — NITROGLYCERIN 0.4 MG SL SUBL: 0.4000 mg | SUBLINGUAL_TABLET | SUBLINGUAL | Status: DC | PRN

## 2014-11-13 MED ORDER — VANCOMYCIN HCL IN DEXTROSE 1-5 GM/200ML-% IV SOLN
1000.0000 mg | INTRAVENOUS | Status: DC
Start: 2014-11-15 — End: 2014-11-18
  Administered 2014-11-15: 1000 mg via INTRAVENOUS
  Filled 2014-11-13 (×4): qty 200

## 2014-11-13 MED ORDER — HEPARIN SODIUM (PORCINE) 5000 UNIT/ML IJ SOLN
5000.0000 [IU] | Freq: Three times a day (TID) | INTRAMUSCULAR | Status: DC
  Administered 2014-11-13 – 2014-11-20 (×17): 5000 [IU] via SUBCUTANEOUS
  Filled 2014-11-13 (×26): qty 1

## 2014-11-13 MED ORDER — DEXTROSE 5 % IV SOLN
2.0000 g | INTRAVENOUS | Status: DC
  Administered 2014-11-15: 2 g via INTRAVENOUS
  Filled 2014-11-13 (×2): qty 2

## 2014-11-13 MED ORDER — CALCIUM ACETATE (PHOS BINDER) 667 MG PO CAPS
667.0000 mg | ORAL_CAPSULE | Freq: Two times a day (BID) | ORAL | Status: DC
  Administered 2014-11-14 – 2014-11-20 (×9): 667 mg via ORAL
  Filled 2014-11-13 (×11): qty 1

## 2014-11-13 MED ORDER — AZELASTINE HCL 0.1 % NA SOLN
1.0000 | Freq: Every day | NASAL | Status: DC
  Administered 2014-11-14 – 2014-11-20 (×5): 1 via NASAL
  Filled 2014-11-13 (×3): qty 30

## 2014-11-13 MED ORDER — DEXTROSE 5 % IV SOLN: 1.0000 g | Freq: Three times a day (TID) | INTRAVENOUS | Status: DC

## 2014-11-13 MED ORDER — SIMVASTATIN 5 MG PO TABS
5.0000 mg | ORAL_TABLET | Freq: Every day | ORAL | Status: DC
  Administered 2014-11-14 – 2014-11-18 (×5): 5 mg via ORAL
  Filled 2014-11-13 (×9): qty 1

## 2014-11-13 MED ORDER — DOCUSATE SODIUM 100 MG PO CAPS
100.0000 mg | ORAL_CAPSULE | Freq: Every day | ORAL | Status: DC
  Administered 2014-11-14 – 2014-11-20 (×5): 100 mg via ORAL
  Filled 2014-11-13 (×7): qty 1

## 2014-11-13 MED ORDER — PIPERACILLIN-TAZOBACTAM 3.375 G IVPB 30 MIN
3.3750 g | Freq: Once | INTRAVENOUS | Status: AC
  Administered 2014-11-13: 3.375 g via INTRAVENOUS
  Filled 2014-11-13: qty 50

## 2014-11-13 MED ORDER — CEFEPIME HCL 2 G IJ SOLR
2.0000 g | Freq: Once | INTRAMUSCULAR | Status: AC
Start: 2014-11-13 — End: 2014-11-14
  Administered 2014-11-14: 2 g via INTRAVENOUS
  Filled 2014-11-13: qty 2

## 2014-11-13 MED ORDER — HYDRALAZINE HCL 50 MG PO TABS
50.0000 mg | ORAL_TABLET | Freq: Two times a day (BID) | ORAL | Status: DC
  Administered 2014-11-14 – 2014-11-16 (×6): 50 mg via ORAL
  Filled 2014-11-13 (×9): qty 1

## 2014-11-13 MED ORDER — GABAPENTIN 300 MG PO CAPS
300.0000 mg | ORAL_CAPSULE | Freq: Every day | ORAL | Status: DC
  Administered 2014-11-14 – 2014-11-18 (×5): 300 mg via ORAL
  Filled 2014-11-13 (×9): qty 1

## 2014-11-13 MED ORDER — ASPIRIN EC 81 MG PO TBEC
81.0000 mg | DELAYED_RELEASE_TABLET | Freq: Every day | ORAL | Status: DC
  Administered 2014-11-14 – 2014-11-20 (×6): 81 mg via ORAL
  Filled 2014-11-13 (×7): qty 1

## 2014-11-13 MED ORDER — ALBUTEROL SULFATE (2.5 MG/3ML) 0.083% IN NEBU: 2.5000 mg | INHALATION_SOLUTION | Freq: Four times a day (QID) | RESPIRATORY_TRACT | Status: DC | PRN

## 2014-11-13 MED ORDER — VANCOMYCIN HCL 10 G IV SOLR
1500.0000 mg | Freq: Once | INTRAVENOUS | Status: AC
  Administered 2014-11-13: 1500 mg via INTRAVENOUS
  Filled 2014-11-13: qty 1500

## 2014-11-13 NOTE — ED Notes (Signed)
Pt. Had dialysis today and was at home using the bathroom when she had a syncopal episode. Family witnessed the episode and caught pt. Before she fell off the toilet. Pt. States she had a similar episode about 2 weeks ago and saw her PCP for it. Pt. Also having rectal pain 8/10 soreness that has been going on for 3 weeks.

## 2014-11-13 NOTE — ED Provider Notes (Signed)
CSN: [id number]     Arrival date & time 11/13/14  1911 History   First MD Initiated Contact with Patient 11/13/14 1912     Chief Complaint  Patient presents with  . Loss of Consciousness  . Rectal Pain     (Consider location/radiation/quality/duration/timing/severity/associated sxs/prior Treatment) Patient is a 81 y.o. female presenting with syncope.  Loss of Consciousness Episode history:  Single Most recent episode:  Today Duration:  1 minute Timing:  Constant Progression:  Resolved Chronicity:  Recurrent Context: sitting down   Context comment:  Post straining with BM Witnessed: yes   Relieved by:  Nothing Worsened by:  Nothing tried Ineffective treatments:  None tried Associated symptoms: rectal bleeding (with constipation, brb)   Associated symptoms: no anxiety, no chest pain, no confusion, no difficulty breathing, no dizziness, no fever, no headaches, no nausea and no vomiting     Past Medical History  Diagnosis Date  . COPD (chronic obstructive pulmonary disease)     emphysema  . Neuropathy   . Hyperlipidemia   . Diabetes mellitus   . Depression   . Anemia   . Shortness of breath   . GERD (gastroesophageal reflux disease)   . Hyperparathyroidism   . Hypertension     sees Dr. Gilford Rile  . Headache(784.0)   . Arthritis   . CAD (coronary artery disease)     sees Dr. Jenne Campus, [facility], [place]  . 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 Wears glasses   . Wears hearing aid     right  . Presence of surgically created AV shunt for hemodialysis     old lt upper arm out-rt upper arm shunt in  . Anginal pain     Dr Raliegh Ip in [place], [place]  . History of kidney stones   . Constipation   . History of blood transfusion   . Asthma     years ago  . Chronic kidney disease     TTH [facility] , [place]- Hemo   Past Surgical History  Procedure Laterality Date  . Abdominal hysterectomy  1976  . Kidney stone surgery  2013   . Av fistula placement  01/20/2012    Procedure: ARTERIOVENOUS (AV) FISTULA CREATION;  Surgeon: Elam Dutch, MD;  Location: [place];  Service: Vascular;  Laterality: Left;  . Hemodialysis catheter  05/25/12    secondary to failed AVF   . Eye surgery      bilateral cataract removed  . Av fistula placement  09/05/2012    Procedure: INSERTION OF ARTERIOVENOUS (AV) GORE-TEX GRAFT ARM;  Surgeon: Elam Dutch, MD;  [facility];  Service: Vascular;  Laterality: Left;  Using 4-43mm x 45 cm Vascular stretch goretex graft  . Ligation of arteriovenous  fistula  09/05/2012    Procedure: LIGATION OF ARTERIOVENOUS  FISTULA;  Surgeon: Elam Dutch, MD;  Location: [place];  Service: Vascular;  Laterality: Left;  . Ligation arteriovenous gortex graft  09/05/2012    Procedure: LIGATION ARTERIOVENOUS GORTEX GRAFT;  Surgeon: Elam Dutch, MD;  Location: [place];  Service: Vascular;  Laterality: Left;  . Av fistula placement Right 11/07/2012    Procedure: ARTERIOVENOUS (AV) FISTULA CREATION;  Surgeon: Elam Dutch, MD;  Location: [facility] OR;  Service: Vascular;  Laterality: Right;  Bascilic Vein Fistula  . Appendectomy    . Carpal tunnel release Left 02/10/2013    Procedure: CARPAL TUNNEL RELEASE;  Surgeon: Youlanda Mighty Sypher  Brooke Bonito., MD;  Location: [place];  Service: Orthopedics;  Laterality: Left;  . Bascilic vein transposition Right 03/15/2013    Procedure: 2ND STAGE BASCILIC VEIN TRANSPOSITION - RIGHT ARM;  Surgeon: Elam Dutch, MD;  Location: [place];  Service: Vascular;  Laterality: Right;  . Av fistula placement Right 05/15/2013    Procedure: INSERTION OF ARTERIOVENOUS (AV) GORE-TEX GRAFT ARM-RIGHT;  Surgeon: Elam Dutch, MD;  Location: [place];  Service: Vascular;  Laterality: Right;  . Shuntogram N/A 07/27/2012    Procedure: Earney Mallet;  Surgeon: Rosetta Posner, MD;  Location: [facility] CATH LAB;  Service: Cardiovascular;  Laterality: N/A;   Family History  Problem Relation Age of Onset  .  Diabetes Mother   . Cancer Father   . Deep vein thrombosis Brother   . Hypertension Brother    History  Substance Use Topics  . Smoking status: Never Smoker   . Smokeless tobacco: Never Used  . Alcohol Use: No   OB History    No data available     Review of Systems  Constitutional: Negative for fever.  Cardiovascular: Positive for syncope. Negative for chest pain.  Gastrointestinal: Negative for nausea and vomiting.  Neurological: Negative for dizziness and headaches.  Psychiatric/Behavioral: Negative for confusion.  All other systems reviewed and are negative.     Allergies  Review of patient's allergies indicates no known allergies.  Home Medications   Prior to Admission medications   Medication Sig Start Date End Date Taking? Authorizing Provider  albuterol (PROVENTIL) (2.5 MG/3ML) 0.083% nebulizer solution Take 2.5 mg by nebulization every 6 (six) hours as needed for wheezing or shortness of breath.   Yes Historical Provider, MD  aspirin EC 81 MG tablet Take 81 mg by mouth daily.   Yes Historical Provider, MD  Azelastine HCl 0.15 % SOLN Place 1 spray into both nostrils daily.  10/10/14  Yes Historical Provider, MD  calcium acetate (PHOSLO) 667 MG capsule Take 667 mg by mouth 2 (two) times daily before a meal.    Yes Historical Provider, MD  Docusate Calcium (STOOL SOFTENER PO) Take 1 tablet by mouth as needed (for constipation).   Yes Historical Provider, MD  gabapentin (NEURONTIN) 300 MG capsule Take 300 mg by mouth at bedtime.   Yes Historical Provider, MD  hydrALAZINE (APRESOLINE) 100 MG tablet Take 50 mg by mouth 2 (two) times daily.    Yes Historical Provider, MD  HYDROcodone-acetaminophen (NORCO) 10-325 MG per tablet Take 1 tablet by mouth every 6 (six) hours as needed for severe pain. 10/15/14  Yes Carmin Muskrat, MD  multivitamin (RENA-VIT) TABS tablet Take 1 tablet by mouth daily.   Yes Historical Provider, MD  nitroGLYCERIN (NITROSTAT) 0.4 MG SL tablet Place 0.4  mg under the tongue every 5 (five) minutes as needed for chest pain.   Yes Historical Provider, MD  pantoprazole (PROTONIX) 40 MG tablet Take 40 mg by mouth daily.   Yes Historical Provider, MD  polyethylene glycol (MIRALAX / GLYCOLAX) packet Take 17 g by mouth daily.   Yes Historical Provider, MD  simvastatin (ZOCOR) 5 MG tablet Take 5 mg by mouth at bedtime.    Yes Historical Provider, MD  traMADol (ULTRAM) 50 MG tablet Take 50 mg by mouth every 6 (six) hours as needed. for pain 08/28/14  Yes Historical Provider, MD   BP 159/73 mmHg  Pulse 105  Temp(Src) 98.8 F (37.1 C) (Oral)  Resp 30  Ht 5\' 2"  (1.575 m)  Wt 179 lb (  81.194 kg)  BMI 32.73 kg/m2  SpO2 94% Physical Exam  Constitutional: She is oriented to person, place, and time. She appears well-developed and well-nourished.  HENT:  Head: Normocephalic and atraumatic.  Right Ear: External ear normal.  Left Ear: External ear normal.  Eyes: Conjunctivae and EOM are normal. Pupils are equal, round, and reactive to light.  Neck: Normal range of motion. Neck supple.  Cardiovascular: Normal rate, regular rhythm, normal heart sounds and intact distal pulses.   Pulmonary/Chest: Effort normal and breath sounds normal.  Abdominal: Soft. Bowel sounds are normal. There is no tenderness.  Musculoskeletal: Normal range of motion.  Neurological: She is alert and oriented to person, place, and time.  Skin: Skin is warm and dry.  Vitals reviewed.   ED Course  Procedures (including critical care time) Labs Review Labs Reviewed  CBC WITH DIFFERENTIAL/PLATELET - Abnormal; Notable for the following:    WBC 14.6 (*)    RBC 3.79 (*)    Hemoglobin 10.4 (*)    HCT 32.0 (*)    RDW 16.8 (*)    Neutrophils Relative % 89 (*)    Neutro Abs 13.0 (*)    Lymphocytes Relative 6 (*)    All other components within normal limits  COMPREHENSIVE METABOLIC PANEL - Abnormal; Notable for the following:    Chloride 94 (*)    CO2 34 (*)    Glucose, Bld 161  (*)    Creatinine, Ser 5.82 (*)    Total Protein 8.8 (*)    Albumin 3.0 (*)    GFR calc non Af Amer 6 (*)    GFR calc Af Amer 7 (*)    All other components within normal limits  CBG MONITORING, ED - Abnormal; Notable for the following:    Glucose-Capillary 127 (*)    All other components within normal limits  CULTURE, BLOOD (ROUTINE X 2)  CULTURE, BLOOD (ROUTINE X 2)  CULTURE, EXPECTORATED SPUTUM-ASSESSMENT  GRAM STAIN  TROPONIN I  HIV ANTIBODY (ROUTINE TESTING)  LEGIONELLA ANTIGEN, URINE  STREP PNEUMONIAE URINARY ANTIGEN  POCT CBG (FASTING - GLUCOSE)-MANUAL ENTRY    Imaging Review Dg Chest 2 View  11/13/2014   CLINICAL DATA:  Cough for 2 days.  EXAM: CHEST  2 VIEW  COMPARISON:  10/27/2014  FINDINGS: There is consolidation in the left base, possibly a small left effusion. The right lung is clear. There is moderate cardiomegaly which appears worsened.  IMPRESSION: Increase size of the cardiac silhouette compared to 10/27/2014. This is indeterminate with regard to pericardial effusion versus cardiac enlargement.  Consolidation and possible effusion in the left base.   Electronically Signed   By: Andreas Newport M.D.   On: 11/13/2014 21:00     EKG Interpretation   Date/Time:  Tuesday November 13 2014 19:36:50 EDT Ventricular Rate:  96 PR Interval:  198 QRS Duration: 127 QT Interval:  408 QTC Calculation: 516 R Axis:   -54 Text Interpretation:  Sinus or ectopic atrial rhythm RBBB and LAFB Left  ventricular hypertrophy Baseline wander in lead(s) V2 No significant  change since last tracing Confirmed by Debby Freiberg [phone]) on  11/13/2014 8:50:03 PM      MDM   Final diagnoses:  HCAP (healthcare-associated pneumonia)  Vasovagal syncope    81 y.o. female with pertinent PMH of ESRD (TRSa), COPD, DM presents with syncopal episode as above. History makes likely etiology vasovagal given constipation and straining with bowel movement. Unfortunately, the patient also endorses  cough, has a leukocytosis, and a  chest x-ray demonstrating consolidation. This is likely HCAP.  She is also requiring oxygen, which she does not normally at home. Given constellation of historical and exam elements, consulted medicine for admission.    I have reviewed all laboratory and imaging studies if ordered as above  1. HCAP (healthcare-associated pneumonia)   2. Vasovagal syncope         Debby Freiberg, MD 11/13/14 [id number]

## 2014-11-13 NOTE — Progress Notes (Signed)
ANTIBIOTIC CONSULT NOTE - INITIAL  Pharmacy Consult for Vancomycin/Cefepime  Indication: rule out pneumonia  No Known Allergies  Patient Measurements: Height: 5\' 2"  (157.5 cm) Weight: 179 lb (81.194 kg) IBW/kg (Calculated) : 50.1  Vital Signs: Temp: 98.8 F (37.1 C) (03/15 1920) Temp Source: Oral (03/15 1920) BP: 159/73 mmHg (03/15 2245) Pulse Rate: 105 (03/15 2245)  Labs:  Recent Labs  11/13/14 2110  WBC 14.6*  HGB 10.4*  PLT 367  CREATININE 5.82*   Estimated Creatinine Clearance: 7 mL/min (by C-G formula based on Cr of 5.82).  Medical History: Past Medical History  Diagnosis Date  . COPD (chronic obstructive pulmonary disease)     emphysema  . Neuropathy   . Hyperlipidemia   . Diabetes mellitus   . Depression   . Anemia   . Shortness of breath   . GERD (gastroesophageal reflux disease)   . Hyperparathyroidism   . Hypertension     sees Dr. Gilford Rile  . Headache(784.0)   . Arthritis   . CAD (coronary artery disease)     sees Dr. Jenne Campus, [facility], [place]  . 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 Wears glasses   . Wears hearing aid     right  . Presence of surgically created AV shunt for hemodialysis     old lt upper arm out-rt upper arm shunt in  . Anginal pain     Dr Raliegh Ip in [place], [place]  . History of kidney stones   . Constipation   . History of blood transfusion   . Asthma     years ago  . Chronic kidney disease     TTH SAT [place], [place]- Hemo   Assessment: 81 y/o F here with syncope, possible infiltrate on CXR, pt is ESRD on HD TTS, WBC mildly elevated, other labs as above.   Goal of Therapy:  Pre-HD vancomycin level 15-25 mg/L  Plan:  -Vancomycin 1500 mg already given, then 1000 mg IV qHD Tues/Thurs/Sat -Cefepime 2g IV x 1 now, then 2g IV q1800 on Tues/Thurs/Sat -Trend WBC, temp, HD schedule -Drug levels at steady state  Narda Bonds 11/13/2014,11:23 PM

## 2014-11-13 NOTE — H&P (Signed)
[facility] History and Physical  Annette Roach VVO:[id number] DOB: 01/20/1928 DOA: 11/13/2014  Referring physician: EDP PCP: Gilford Rile, MD   Chief Complaint: Syncope   HPI: Annette Roach is a 81 y.o. female who presents to the ED with rectal pain and syncope.  Patient developed severe constipation earlier today, was straining to have a BM and had LOC while on the toilet.  Thankfully family were able to catch her before she fell so is uninjured.  In addition to a rectal fissure identified by the EDP, patient also has had 2 week history of non-productive cough that has been persistent.  Review of Systems: Systems reviewed.  As above, otherwise negative  Past Medical History  Diagnosis Date  . COPD (chronic obstructive pulmonary disease)     emphysema  . Neuropathy   . Hyperlipidemia   . Diabetes mellitus   . Depression   . Anemia   . Shortness of breath   . GERD (gastroesophageal reflux disease)   . Hyperparathyroidism   . Hypertension     sees Dr. Gilford Rile  . Headache(784.0)   . Arthritis   . CAD (coronary artery disease)     sees Dr. Jenne Campus, [facility], [place]  . 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 Wears glasses   . Wears hearing aid     right  . Presence of surgically created AV shunt for hemodialysis     old lt upper arm out-rt upper arm shunt in  . Anginal pain     Dr Raliegh Ip in [place], [place]  . History of kidney stones   . Constipation   . History of blood transfusion   . Asthma     years ago  . Chronic kidney disease     TTH SAT [place], [place]- Hemo   Past Surgical History  Procedure Laterality Date  . Abdominal hysterectomy  1976  . Kidney stone surgery  2013  . Av fistula placement  01/20/2012    Procedure: ARTERIOVENOUS (AV) FISTULA CREATION;  Surgeon: Elam Dutch, MD;  Location: [place];  Service: Vascular;  Laterality: Left;  . Hemodialysis catheter  05/25/12    secondary to failed AVF   .  Eye surgery      bilateral cataract removed  . Av fistula placement  09/05/2012    Procedure: INSERTION OF ARTERIOVENOUS (AV) GORE-TEX GRAFT ARM;  Surgeon: Elam Dutch, MD;  [facility];  Service: Vascular;  Laterality: Left;  Using 4-14mm x 45 cm Vascular stretch goretex graft  . Ligation of arteriovenous  fistula  09/05/2012    Procedure: LIGATION OF ARTERIOVENOUS  FISTULA;  Surgeon: Elam Dutch, MD;  Location: [place];  Service: Vascular;  Laterality: Left;  . Ligation arteriovenous gortex graft  09/05/2012    Procedure: LIGATION ARTERIOVENOUS GORTEX GRAFT;  Surgeon: Elam Dutch, MD;  Location: [place];  Service: Vascular;  Laterality: Left;  . Av fistula placement Right 11/07/2012    Procedure: ARTERIOVENOUS (AV) FISTULA CREATION;  Surgeon: Elam Dutch, MD;  Location: [facility] OR;  Service: Vascular;  Laterality: Right;  Bascilic Vein Fistula  . Appendectomy    . Carpal tunnel release Left 02/10/2013    Procedure: CARPAL TUNNEL RELEASE;  Surgeon: Cammie Sickle., MD;  Location: [place];  Service: Orthopedics;  Laterality: Left;  . Bascilic vein transposition Right 03/15/2013    Procedure: 2ND STAGE BASCILIC VEIN TRANSPOSITION - RIGHT ARM;  Surgeon: Elam Dutch, MD;  Location: [place];  Service: Vascular;  Laterality: Right;  . Av fistula placement Right 05/15/2013    Procedure: INSERTION OF ARTERIOVENOUS (AV) GORE-TEX GRAFT ARM-RIGHT;  Surgeon: Elam Dutch, MD;  Location: [place];  Service: Vascular;  Laterality: Right;  . Shuntogram N/A 07/27/2012    Procedure: Earney Mallet;  Surgeon: Rosetta Posner, MD;  Location: [facility] CATH LAB;  Service: Cardiovascular;  Laterality: N/A;   Social History:  reports that she has never smoked. She has never used smokeless tobacco. She reports that she does not drink alcohol or use illicit drugs.  No Known Allergies  Family History  Problem Relation Age of Onset  . Diabetes Mother   . Cancer Father   . Deep vein thrombosis  Brother   . Hypertension Brother      Prior to Admission medications   Medication Sig Start Date End Date Taking? Authorizing Provider  albuterol (PROVENTIL) (2.5 MG/3ML) 0.083% nebulizer solution Take 2.5 mg by nebulization every 6 (six) hours as needed for wheezing or shortness of breath.   Yes Historical Provider, MD  aspirin EC 81 MG tablet Take 81 mg by mouth daily.   Yes Historical Provider, MD  Azelastine HCl 0.15 % SOLN Place 1 spray into both nostrils daily.  10/10/14  Yes Historical Provider, MD  calcium acetate (PHOSLO) 667 MG capsule Take 667 mg by mouth 2 (two) times daily before a meal.    Yes Historical Provider, MD  Docusate Calcium (STOOL SOFTENER PO) Take 1 tablet by mouth as needed (for constipation).   Yes Historical Provider, MD  gabapentin (NEURONTIN) 300 MG capsule Take 300 mg by mouth at bedtime.   Yes Historical Provider, MD  hydrALAZINE (APRESOLINE) 100 MG tablet Take 50 mg by mouth 2 (two) times daily.    Yes Historical Provider, MD  HYDROcodone-acetaminophen (NORCO) 10-325 MG per tablet Take 1 tablet by mouth every 6 (six) hours as needed for severe pain. 10/15/14  Yes Carmin Muskrat, MD  multivitamin (RENA-VIT) TABS tablet Take 1 tablet by mouth daily.   Yes Historical Provider, MD  nitroGLYCERIN (NITROSTAT) 0.4 MG SL tablet Place 0.4 mg under the tongue every 5 (five) minutes as needed for chest pain.   Yes Historical Provider, MD  pantoprazole (PROTONIX) 40 MG tablet Take 40 mg by mouth daily.   Yes Historical Provider, MD  polyethylene glycol (MIRALAX / GLYCOLAX) packet Take 17 g by mouth daily.   Yes Historical Provider, MD  simvastatin (ZOCOR) 5 MG tablet Take 5 mg by mouth at bedtime.    Yes Historical Provider, MD  traMADol (ULTRAM) 50 MG tablet Take 50 mg by mouth every 6 (six) hours as needed. for pain 08/28/14  Yes Historical Provider, MD   Physical Exam: Filed Vitals:   11/13/14 2245  BP: 159/73  Pulse: 105  Temp:   Resp: 30    BP 159/73 mmHg   Pulse 105  Temp(Src) 98.8 F (37.1 C) (Oral)  Resp 30  Ht 5\' 2"  (1.575 m)  Wt 81.194 kg (179 lb)  BMI 32.73 kg/m2  SpO2 94%  General Appearance:    Alert, oriented, no distress, appears stated age  Head:    Normocephalic, atraumatic  Eyes:    PERRL, EOMI, sclera non-icteric        Nose:   Nares without drainage or epistaxis. Mucosa, turbinates normal  Throat:   Moist mucous membranes. Oropharynx without erythema or exudate.  Neck:   Supple. No carotid bruits.  No thyromegaly.  No lymphadenopathy.   Back:     No CVA tenderness, no spinal tenderness  Lungs:     Clear to auscultation bilaterally, without wheezes, rhonchi or rales  Chest wall:    No tenderness to palpitation  Heart:    Regular rate and rhythm without murmurs, gallops, rubs  Abdomen:     Soft, non-tender, nondistended, normal bowel sounds, no organomegaly  Genitalia:    deferred  Rectal:    deferred  Extremities:   No clubbing, cyanosis or edema.  Pulses:   2+ and symmetric all extremities  Skin:   Skin color, texture, turgor normal, no rashes or lesions  Lymph nodes:   Cervical, supraclavicular, and axillary nodes normal  Neurologic:   CNII-XII intact. Normal strength, sensation and reflexes      throughout    Labs on Admission:  Basic Metabolic Panel:  Recent Labs Lab 11/13/14 2110  NA 137  K 3.7  CL 94*  CO2 34*  GLUCOSE 161*  BUN 14  CREATININE 5.82*  CALCIUM 9.2   Liver Function Tests:  Recent Labs Lab 11/13/14 2110  AST 28  ALT 22  ALKPHOS 75  BILITOT 1.0  PROT 8.8*  ALBUMIN 3.0*   No results for input(s): LIPASE, AMYLASE in the last 168 hours. No results for input(s): AMMONIA in the last 168 hours. CBC:  Recent Labs Lab 11/13/14 2110  WBC 14.6*  NEUTROABS 13.0*  HGB 10.4*  HCT 32.0*  MCV 84.4  PLT 367   Cardiac Enzymes:  Recent Labs Lab 11/13/14 2110  TROPONINI 0.03    BNP (last 3 results) No results for input(s): PROBNP in the last 8760 hours. CBG:  Recent  Labs Lab 11/13/14 2001  GLUCAP 127*    Radiological Exams on Admission: Dg Chest 2 View  11/13/2014   CLINICAL DATA:  Cough for 2 days.  EXAM: CHEST  2 VIEW  COMPARISON:  10/27/2014  FINDINGS: There is consolidation in the left base, possibly a small left effusion. The right lung is clear. There is moderate cardiomegaly which appears worsened.  IMPRESSION: Increase size of the cardiac silhouette compared to 10/27/2014. This is indeterminate with regard to pericardial effusion versus cardiac enlargement.  Consolidation and possible effusion in the left base.   Electronically Signed   By: Andreas Newport M.D.   On: 11/13/2014 21:00    EKG: Independently reviewed.  Assessment/Plan Principal Problem:   HCAP (healthcare-associated pneumonia) Active Problems:   End stage renal disease on dialysis   Vasovagal syncope   Leukocytosis   1. HCAP - 1. PNA pathway 2. Cefepime and vanc 3. Cultures pending 4. Tele monitor for tachycardia 5. Follow CBC to trend WBC 2. Vasovagal syncope - patients history is very consistent with a strain induced vasovagal syncope due to her constipation.  Will leave on tele monitor while here but no further work up ordered at this time. 3. ESRD - dialysis TTS, with last session today, call nephrology if patient remains in hospital for Thursday dialysis.    Code Status: Full  Family Communication: Daughter at bedside Disposition Plan: Admit to inpatient   Time spent: 58 min  Kahlen Boyde M. [facility] Pager [phone]  If 7AM-7PM, please contact the day team taking care of the patient [email or web address] Password [facility] 11/13/2014, 11:12 PM

## 2014-11-14 DIAGNOSIS — R55 Syncope and collapse: Secondary | ICD-10-CM

## 2014-11-14 LAB — TROPONIN I: Troponin I: 0.03 ng/mL (ref <0.031)

## 2014-11-14 LAB — GLUCOSE, CAPILLARY: GLUCOSE-CAPILLARY: 107 mg/dL — AB (ref 70–99)

## 2014-11-14 LAB — MRSA PCR SCREENING: MRSA BY PCR: NEGATIVE

## 2014-11-14 MED ORDER — SODIUM CHLORIDE 0.9 % IV SOLN
125.0000 mg | Freq: Once | INTRAVENOUS | Status: AC
  Administered 2014-11-15: 125 mg via INTRAVENOUS
  Filled 2014-11-14 (×2): qty 10

## 2014-11-14 MED ORDER — RENA-VITE PO TABS
1.0000 | ORAL_TABLET | Freq: Every day | ORAL | Status: DC
  Administered 2014-11-14 – 2014-11-18 (×4): 1 via ORAL
  Filled 2014-11-14 (×11): qty 1

## 2014-11-14 NOTE — Progress Notes (Signed)
  Echocardiogram 2D Echocardiogram has been performed.  Annette Roach 11/14/2014, 4:07 PM

## 2014-11-14 NOTE — Consult Note (Signed)
[facility] Renal Consultation Note  Indication for Consultation:  Management of ESRD/hemodialysis; anemia, hypertension/volume and secondary hyperparathyroidism  HPI: Annette Roach is a 81 y.o. female admitted with PNA,  Vaso vagal syncopal type  spell on toilet last pm ('' caught by [facility] before Fall") with reported  Constipation . Her HD days are TTS ([place] [facility]) and has not missed any recent HD.  She has been fairly stable at OP HD except for some low Access flows  In her R arm AVGG and was to have a Shuntogram at [facility] today. She has no current cos in room except constipation .   Past Medical History  Diagnosis Date  . COPD (chronic obstructive pulmonary disease)     emphysema  . Neuropathy   . Hyperlipidemia   . Diabetes mellitus   . Depression   . Anemia   . Shortness of breath   . GERD (gastroesophageal reflux disease)   . Hyperparathyroidism   . Hypertension     sees Dr. Gilford Rile  . Headache(784.0)   . Arthritis   . CAD (coronary artery disease)     sees Dr. Jenne Campus, [facility] cardiology [facility], [place]  . 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 Wears glasses   . Wears hearing aid     right  . Presence of surgically created AV shunt for hemodialysis     old lt upper arm out-rt upper arm shunt in  . Anginal pain     Dr Raliegh Ip in [place], [place]  . History of kidney stones   . Constipation   . History of blood transfusion   . Asthma     years ago  . Chronic kidney disease     TTH SAT [place], [place]- Hemo    Past Surgical History  Procedure Laterality Date  . Abdominal hysterectomy  1976  . Kidney stone surgery  2013  . Av fistula placement  01/20/2012    Procedure: ARTERIOVENOUS (AV) FISTULA CREATION;  Surgeon: Elam Dutch, MD;  Location: [place];  Service: Vascular;  Laterality: Left;  . Hemodialysis catheter  05/25/12    secondary to failed AVF   . Eye surgery      bilateral cataract removed  . Av  fistula placement  09/05/2012    Procedure: INSERTION OF ARTERIOVENOUS (AV) GORE-TEX GRAFT ARM;  Surgeon: Elam Dutch, MD;  Location: [facility] OR;  Service: Vascular;  Laterality: Left;  Using 4-27mm x 45 cm Vascular stretch goretex graft  . Ligation of arteriovenous  fistula  09/05/2012    Procedure: LIGATION OF ARTERIOVENOUS  FISTULA;  Surgeon: Elam Dutch, MD;  Location: [place];  Service: Vascular;  Laterality: Left;  . Ligation arteriovenous gortex graft  09/05/2012    Procedure: LIGATION ARTERIOVENOUS GORTEX GRAFT;  Surgeon: Elam Dutch, MD;  Location: [place];  Service: Vascular;  Laterality: Left;  . Av fistula placement Right 11/07/2012    Procedure: ARTERIOVENOUS (AV) FISTULA CREATION;  Surgeon: Elam Dutch, MD;  Location: [facility] OR;  Service: Vascular;  Laterality: Right;  Bascilic Vein Fistula  . Appendectomy    . Carpal tunnel release Left 02/10/2013    Procedure: CARPAL TUNNEL RELEASE;  Surgeon: Cammie Sickle., MD;  Location: [place];  Service: Orthopedics;  Laterality: Left;  . Bascilic vein transposition Right 03/15/2013    Procedure: 2ND STAGE BASCILIC VEIN TRANSPOSITION - RIGHT ARM;  Surgeon: Elam Dutch,  MD;  Location: [place];  Service: Vascular;  Laterality: Right;  . Av fistula placement Right 05/15/2013    Procedure: INSERTION OF ARTERIOVENOUS (AV) GORE-TEX GRAFT ARM-RIGHT;  Surgeon: Elam Dutch, MD;  Location: [place];  Service: Vascular;  Laterality: Right;  . Shuntogram N/A 07/27/2012    Procedure: Earney Mallet;  Surgeon: Rosetta Posner, MD;  Location: [facility] CATH LAB;  Service: Cardiovascular;  Laterality: N/A;      Family History  Problem Relation Age of Onset  . Diabetes Mother   . Cancer Father   . Deep vein thrombosis Brother   . Hypertension Brother   Social = lives with family in [place] ([place]  From [place] ) and     reports that she has never smoked. She has never used smokeless tobacco. She reports that she does not drink alcohol or  use illicit drugs.  No Known Allergies  Prior to Admission medications   Medication Sig Start Date End Date Taking? Authorizing Provider  albuterol (PROVENTIL) (2.5 MG/3ML) 0.083% nebulizer solution Take 2.5 mg by nebulization every 6 (six) hours as needed for wheezing or shortness of breath.   Yes Historical Provider, MD  aspirin EC 81 MG tablet Take 81 mg by mouth daily.   Yes Historical Provider, MD  Azelastine HCl 0.15 % SOLN Place 1 spray into both nostrils daily.  10/10/14  Yes Historical Provider, MD  calcium acetate (PHOSLO) 667 MG capsule Take 667 mg by mouth 2 (two) times daily before a meal.    Yes Historical Provider, MD  Docusate Calcium (STOOL SOFTENER PO) Take 1 tablet by mouth as needed (for constipation).   Yes Historical Provider, MD  gabapentin (NEURONTIN) 300 MG capsule Take 300 mg by mouth at bedtime.   Yes Historical Provider, MD  hydrALAZINE (APRESOLINE) 100 MG tablet Take 50 mg by mouth 2 (two) times daily.    Yes Historical Provider, MD  HYDROcodone-acetaminophen (NORCO) 10-325 MG per tablet Take 1 tablet by mouth every 6 (six) hours as needed for severe pain. 10/15/14  Yes Carmin Muskrat, MD  multivitamin (RENA-VIT) TABS tablet Take 1 tablet by mouth daily.   Yes Historical Provider, MD  nitroGLYCERIN (NITROSTAT) 0.4 MG SL tablet Place 0.4 mg under the tongue every 5 (five) minutes as needed for chest pain.   Yes Historical Provider, MD  pantoprazole (PROTONIX) 40 MG tablet Take 40 mg by mouth daily.   Yes Historical Provider, MD  polyethylene glycol (MIRALAX / GLYCOLAX) packet Take 17 g by mouth daily.   Yes Historical Provider, MD  simvastatin (ZOCOR) 5 MG tablet Take 5 mg by mouth at bedtime.    Yes Historical Provider, MD  traMADol (ULTRAM) 50 MG tablet Take 50 mg by mouth every 6 (six) hours as needed. for pain 08/28/14  Yes Historical Provider, MD    KJZ:[id number], nitroGLYCERIN, traMADol  Results for orders placed or performed during the hospital encounter of  11/13/14 (from the past 48 hour(s))  CBG monitoring, ED     Status: Abnormal   Collection Time: 11/13/14  8:01 PM  Result Value Ref Range   Glucose-Capillary 127 (H) 70 - 99 mg/dL  CBC WITH DIFFERENTIAL     Status: Abnormal   Collection Time: 11/13/14  9:10 PM  Result Value Ref Range   WBC 14.6 (H) 4.0 - 10.5 K/uL   RBC 3.79 (L) 3.87 - 5.11 MIL/uL   Hemoglobin 10.4 (L) 12.0 - 15.0 g/dL   HCT 32.0 (L) 36.0 - 46.0 %   MCV  84.4 78.0 - 100.0 fL   MCH 27.4 26.0 - 34.0 pg   MCHC 32.5 30.0 - 36.0 g/dL   RDW 16.8 (H) 11.5 - 15.5 %   Platelets 367 150 - 400 K/uL   Neutrophils Relative % 89 (H) 43 - 77 %   Neutro Abs 13.0 (H) 1.7 - 7.7 K/uL   Lymphocytes Relative 6 (L) 12 - 46 %   Lymphs Abs 0.8 0.7 - 4.0 K/uL   Monocytes Relative 5 3 - 12 %   Monocytes Absolute 0.7 0.1 - 1.0 K/uL   Eosinophils Relative 0 0 - 5 %   Eosinophils Absolute 0.1 0.0 - 0.7 K/uL   Basophils Relative 0 0 - 1 %   Basophils Absolute 0.0 0.0 - 0.1 K/uL  Comprehensive metabolic panel     Status: Abnormal   Collection Time: 11/13/14  9:10 PM  Result Value Ref Range   Sodium 137 135 - 145 mmol/L   Potassium 3.7 3.5 - 5.1 mmol/L   Chloride 94 (L) 96 - 112 mmol/L   CO2 34 (H) 19 - 32 mmol/L   Glucose, Bld 161 (H) 70 - 99 mg/dL   BUN 14 6 - 23 mg/dL   Creatinine, Ser 5.82 (H) 0.50 - 1.10 mg/dL   Calcium 9.2 8.4 - 10.5 mg/dL   Total Protein 8.8 (H) 6.0 - 8.3 g/dL   Albumin 3.0 (L) 3.5 - 5.2 g/dL   AST 28 0 - 37 U/L   ALT 22 0 - 35 U/L   Alkaline Phosphatase 75 39 - 117 U/L   Total Bilirubin 1.0 0.3 - 1.2 mg/dL   GFR calc non Af Amer 6 (L) >90 mL/min   GFR calc Af Amer 7 (L) >90 mL/min    Comment: (NOTE) The eGFR has been calculated using the CKD EPI equation. This calculation has not been validated in all clinical situations. eGFR's persistently <90 mL/min signify possible Chronic Kidney Disease.    Anion gap 9 5 - 15  Troponin I     Status: None   Collection Time: 11/13/14  9:10 PM  Result Value Ref Range    Troponin I 0.03 <0.031 ng/mL    Comment:        NO INDICATION OF MYOCARDIAL INJURY.   MRSA PCR Screening     Status: None   Collection Time: 11/14/14 12:33 AM  Result Value Ref Range   MRSA by PCR NEGATIVE NEGATIVE    Comment:        The GeneXpert MRSA Assay (FDA approved for NASAL specimens only), is one component of a comprehensive MRSA colonization surveillance program. It is not intended to diagnose MRSA infection nor to guide or monitor treatment for MRSA infections.   Glucose, capillary     Status: Abnormal   Collection Time: 11/14/14  6:28 AM  Result Value Ref Range   Glucose-Capillary 107 (H) 70 - 99 mg/dL     ROS: see hpi and  denies fevers, chills, ,reports some dry cough, no sob, ,no chest pain, some nausea but no vomiting or diarrhea .Walks with walker at home.  Physical Exam: Filed Vitals:   11/14/14 0939  BP:   Pulse: 89  Temp: 98.3 F (36.8 C)  Resp:      General: Alert, pleasant elderly B F, NAD, appropriate HEENT: [place], MMM, nonicteric Neck: no jvd Heart: RRR, no mur, rub, or gallop Lungs: decr at bases , otherwise CTA Abdomen: bs pos. Soft NT, ND Extremities: no pedal edema Skin: warm  dry , no overt rash or pedal ulcers Neuro: alert , Ox3, moves all extrem. np apparent acute focal deficits  Dialysis Access: pos. Bruit R UA AVGG  Dialysis Orders: Center: [facility]   on TTS . EDW 71.5 HD Bath 2.0k, 2.0ca  Time 3hrs 11min Heparin 5000. Access RA AVG BFR 400 DFR AF1.5    Calcitriol po 1.23mcg q/HD Epogen 0   Units IV/HD  Venofer  $Remove'50mg'QPfAANy$  iv q hd   Assessment/Plan 1. HCAP- rx per admit team on Cefepime / Vanco 2. ESRD -  HD TTS - K 3.7 next hd tomor =thurs 3. Vasovagal Syncope- admit team rx 4. Hypertension/volume  - bp 126/56/ fu bps / no excess vol. No uf on hd/ op hydralazine $RemoveBefore'50mg'pDvmQPtLsHmCL$  bid listed may be ale to dc   5. Anemia  - admit hgb 10.4/  fe weekly hd, no esa/ fu hgbs' 6. Metabolic bone disease -  Po vit d on hd/ phoslo binder 7. COPD- home meds  per admit  8. DM type 2- diet controlled in past 9. Constipation- Miralax  Ernest Haber, PA-C [place] [phone] 11/14/2014, 9:59 AM   I have seen and examined this patient and agree with the plan of care. No further episodes. Being treated w/ abx for HCAP. Positive bruit in the right upper arm AVG.  Dwana Melena, MD 11/14/2014, 2:17 PM

## 2014-11-14 NOTE — Progress Notes (Signed)
Admission note:  Arrival Method: Arrived on stretcher from ED Mental Orientation: Alert and oriented x 4 Telemetry: Telemetry box 01 applied. CCMD notified Assessment: Completed  Skin: Dry and intact  IV: Right upper arm. Clean, dry and intact.  Pain: Pt states no pain at this time Tubes: Right upper arm fistula  Safety Measures: High fall risk. Bed alarm on, socks placed, tubing secured Fall Prevention Safety Plan: Reviewed with pt Admission Screening: Completed  6700 Orientation: Patient has been oriented to the unit, staff and to the room.  Pt lying comfortably in bed with no needs stated at this time. Call light within reach, will continue to monitor.  Shelbie Hutching, RN, BSN

## 2014-11-14 NOTE — Progress Notes (Signed)
[facility] PROGRESS NOTE  Annette Roach XFG:[id number] DOB: [birth year] DOA: 11/13/2014 PCP: Gilford Rile, MD  Assessment/Plan: 1-Syncope:  Suspect vaso-vagal.  Check ECHO.  Right BBB.  Cycle enzymes.   2-HCAP; continue with cefepime and vancomycin.  Follow WBC trend.    ESRD; nephrologist consulted.   History stomach cancer; has appointment with oncologist,.   Constipation: Miralax, sorbitol.     Code Status: full code.  Family Communication: care discussed with daughter Disposition Plan: remain inpatient    Consultants:  nephrologist   Procedures:  ECHO;     Antibiotics:  Cefepime 3-15  Vancomycin 3-15  HPI/Subjective: Feeling ok, denies dyspnea, chest pain.   Objective: Filed Vitals:   11/14/14 0939  BP:   Pulse: 89  Temp: 98.3 F (36.8 C)  Resp:     Intake/Output Summary (Last 24 hours) at 11/14/14 1701 Last data filed at 11/14/14 1451  Gross per 24 hour  Intake    840 ml  Output      1 ml  Net    839 ml   Filed Weights   11/13/14 1920  Weight: 81.194 kg (179 lb)    Exam:   General: Alert in no distress.   Cardiovascular: S 1, S 2 RRR  Respiratory: CTA  Abdomen: BS present, soft, nt  Musculoskeletal: no edema.   Data Reviewed: Basic Metabolic Panel:  Recent Labs Lab 11/13/14 2110  NA 137  K 3.7  CL 94*  CO2 34*  GLUCOSE 161*  BUN 14  CREATININE 5.82*  CALCIUM 9.2   Liver Function Tests:  Recent Labs Lab 11/13/14 2110  AST 28  ALT 22  ALKPHOS 75  BILITOT 1.0  PROT 8.8*  ALBUMIN 3.0*   No results for input(s): LIPASE, AMYLASE in the last 168 hours. No results for input(s): AMMONIA in the last 168 hours. CBC:  Recent Labs Lab 11/13/14 2110  WBC 14.6*  NEUTROABS 13.0*  HGB 10.4*  HCT 32.0*  MCV 84.4  PLT 367   Cardiac Enzymes:  Recent Labs Lab 11/13/14 2110  TROPONINI 0.03   BNP (last 3 results) No results for input(s): BNP in the last 8760 hours.  ProBNP (last 3 results) No  results for input(s): PROBNP in the last 8760 hours.  CBG:  Recent Labs Lab 11/13/14 2001 11/14/14 0628  GLUCAP 127* 107*    Recent Results (from the past 240 hour(s))  MRSA PCR Screening     Status: None   Collection Time: 11/14/14 12:33 AM  Result Value Ref Range Status   MRSA by PCR NEGATIVE NEGATIVE Final    Comment:        The GeneXpert MRSA Assay (FDA approved for NASAL specimens only), is one component of a comprehensive MRSA colonization surveillance program. It is not intended to diagnose MRSA infection nor to guide or monitor treatment for MRSA infections.      Studies: Dg Chest 2 View  11/13/2014   CLINICAL DATA:  Cough for 2 days.  EXAM: CHEST  2 VIEW  COMPARISON:  10/27/2014  FINDINGS: There is consolidation in the left base, possibly a small left effusion. The right lung is clear. There is moderate cardiomegaly which appears worsened.  IMPRESSION: Increase size of the cardiac silhouette compared to 10/27/2014. This is indeterminate with regard to pericardial effusion versus cardiac enlargement.  Consolidation and possible effusion in the left base.   Electronically Signed   By: Andreas Newport M.D.   On: 11/13/2014 21:00    Scheduled Meds: . aspirin  EC  81 mg Oral Daily  . azelastine  1 spray Each Nare Daily  . calcium acetate  667 mg Oral BID AC  . [START ON 11/15/2014] ceFEPime (MAXIPIME) IV  2 g Intravenous Q T,Th,Sat-1800  . docusate sodium  100 mg Oral Daily  . ferric gluconate (FERRLECIT/NULECIT) IV  125 mg Intravenous Once  . gabapentin  300 mg Oral QHS  . heparin  5,000 Units Subcutaneous 3 times per day  . hydrALAZINE  50 mg Oral BID  . multivitamin  1 tablet Oral QHS  . pantoprazole  40 mg Oral Daily  . polyethylene glycol  17 g Oral Daily  . simvastatin  5 mg Oral QHS  . [START ON 11/15/2014] vancomycin  1,000 mg Intravenous Q T,Th,Sa-HD   Continuous Infusions:   Principal Problem:   HCAP (healthcare-associated pneumonia) Active  Problems:   End stage renal disease on dialysis   Vasovagal syncope   Leukocytosis    Time spent: 35 minutes.     Niel Hummer A  [facility] Pager [phone]. If 7PM-7AM, please contact night-coverage at [email or web address], password [facility] 11/14/2014, 5:01 PM  LOS: 1 day

## 2014-11-15 DIAGNOSIS — I2510 Atherosclerotic heart disease of native coronary artery without angina pectoris: Secondary | ICD-10-CM

## 2014-11-15 DIAGNOSIS — I519 Heart disease, unspecified: Secondary | ICD-10-CM

## 2014-11-15 LAB — RENAL FUNCTION PANEL
Albumin: 2.2 g/dL — ABNORMAL LOW (ref 3.5–5.2)
Anion gap: 14 (ref 5–15)
BUN: 29 mg/dL — ABNORMAL HIGH (ref 6–23)
CHLORIDE: 97 mmol/L (ref 96–112)
CO2: 26 mmol/L (ref 19–32)
CREATININE: 8.6 mg/dL — AB (ref 0.50–1.10)
Calcium: 9 mg/dL (ref 8.4–10.5)
GFR calc non Af Amer: 4 mL/min — ABNORMAL LOW (ref >90)
GFR, EST AFRICAN AMERICAN: 4 mL/min — AB (ref >90)
Glucose, Bld: 99 mg/dL (ref 70–99)
PHOSPHORUS: 4.7 mg/dL — AB (ref 2.3–4.6)
Potassium: 3.7 mmol/L (ref 3.5–5.1)
Sodium: 137 mmol/L (ref 135–145)

## 2014-11-15 LAB — TROPONIN I
Troponin I: 0.03 ng/mL (ref <0.031)
Troponin I: 0.03 ng/mL (ref <0.031)

## 2014-11-15 LAB — CBC
HCT: 27.3 % — ABNORMAL LOW (ref 36.0–46.0)
Hemoglobin: 8.9 g/dL — ABNORMAL LOW (ref 12.0–15.0)
MCH: 27.2 pg (ref 26.0–34.0)
MCHC: 32.6 g/dL (ref 30.0–36.0)
MCV: 83.5 fL (ref 78.0–100.0)
Platelets: 354 10*3/uL (ref 150–400)
RBC: 3.27 MIL/uL — AB (ref 3.87–5.11)
RDW: 17.3 % — AB (ref 11.5–15.5)
WBC: 8.9 10*3/uL (ref 4.0–10.5)

## 2014-11-15 LAB — HIV ANTIBODY (ROUTINE TESTING W REFLEX): HIV SCREEN 4TH GENERATION: NONREACTIVE

## 2014-11-15 NOTE — Consult Note (Signed)
CARDIOLOGY CONSULT NOTE   Patient ID: Annette Roach MRN: [id number], DOB/AGE: October 16, 1927   Admit date: 11/13/2014 Date of Consult: 11/15/2014   Primary Physician: Gilford Rile, MD Primary Cardiologist: per record, Dr. Agustin Cree at [facility], however pt cannot remember Dr. Agustin Cree or why she saw him  Pt. Profile  pleasant 81 year old African-American female with past medical history of HTN, HLD, DM, COPD, history of angina/CAD, ESRD on HD T/Th/Sat present with LOC while straining for BM and diagnosed with PNA as well. Cardiology consulted for syncope and abnormal echo result.   Problem List  Past Medical History  Diagnosis Date  . COPD (chronic obstructive pulmonary disease)     emphysema  . Neuropathy   . Hyperlipidemia   . Diabetes mellitus   . Depression   . Anemia   . Shortness of breath   . GERD (gastroesophageal reflux disease)   . Hyperparathyroidism   . Hypertension     sees Dr. Gilford Rile  . Headache(784.0)   . Arthritis   . CAD (coronary artery disease)     sees Dr. Jenne Campus, [facility] cardiology [place], [place]  . 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 Wears glasses   . Wears hearing aid     right  . Presence of surgically created AV shunt for hemodialysis     old lt upper arm out-rt upper arm shunt in  . Anginal pain     Dr Raliegh Ip in [place], [place]  . History of kidney stones   . Constipation   . History of blood transfusion   . Asthma     years ago  . Chronic kidney disease     TTH SAT [place], [place]- Hemo    Past Surgical History  Procedure Laterality Date  . Abdominal hysterectomy  1976  . Kidney stone surgery  2013  . Av fistula placement  01/20/2012    Procedure: ARTERIOVENOUS (AV) FISTULA CREATION;  Surgeon: Elam Dutch, MD;  Location: [place];  Service: Vascular;  Laterality: Left;  . Hemodialysis catheter  05/25/12    secondary to failed AVF   . Eye surgery      bilateral cataract removed  . Av  fistula placement  09/05/2012    Procedure: INSERTION OF ARTERIOVENOUS (AV) GORE-TEX GRAFT ARM;  Surgeon: Elam Dutch, MD;  Location: [facility] OR;  Service: Vascular;  Laterality: Left;  Using 4-35mm x 45 cm Vascular stretch goretex graft  . Ligation of arteriovenous  fistula  09/05/2012    Procedure: LIGATION OF ARTERIOVENOUS  FISTULA;  Surgeon: Elam Dutch, MD;  Location: [place];  Service: Vascular;  Laterality: Left;  . Ligation arteriovenous gortex graft  09/05/2012    Procedure: LIGATION ARTERIOVENOUS GORTEX GRAFT;  Surgeon: Elam Dutch, MD;  Location: [place];  Service: Vascular;  Laterality: Left;  . Av fistula placement Right 11/07/2012    Procedure: ARTERIOVENOUS (AV) FISTULA CREATION;  Surgeon: Elam Dutch, MD;  Location: [facility] OR;  Service: Vascular;  Laterality: Right;  Bascilic Vein Fistula  . Appendectomy    . Carpal tunnel release Left 02/10/2013    Procedure: CARPAL TUNNEL RELEASE;  Surgeon: Cammie Sickle., MD;  Location: [place];  Service: Orthopedics;  Laterality: Left;  . Bascilic vein transposition Right 03/15/2013    Procedure: 2ND STAGE BASCILIC VEIN TRANSPOSITION - RIGHT ARM;  Surgeon: Elam Dutch, MD;  Location: [place];  Service: Vascular;  Laterality: Right;  .  Av fistula placement Right 05/15/2013    Procedure: INSERTION OF ARTERIOVENOUS (AV) GORE-TEX GRAFT ARM-RIGHT;  Surgeon: Elam Dutch, MD;  Location: [place];  Service: Vascular;  Laterality: Right;  . Shuntogram N/A 07/27/2012    Procedure: Earney Mallet;  Surgeon: Rosetta Posner, MD;  Location: [facility] CATH LAB;  Service: Cardiovascular;  Laterality: N/A;     Allergies  No Known Allergies  HPI   The patient is a pleasant 81 year old African-American female with past medical history of HTN, HLD, DM, COPD, history of angina/CAD, ESRD on HD T/Th/Sat. According to her record, patient has been followed by Dr. Agustin Cree for anginal pain and CAD. However, patient cannot remember Dr. Agustin Cree and  does not remember when is the last time she saw him. She denies any history of stress test or cardiac catheterization. I have no record regarding her past cardiac history.  According to the patient, she has been on hemodialysis in [place] for the past 3 years. She denies any recent chest pain, shortness breath, fever, chill, or lower extremity edema. She started having a nonproductive cough roughly 3 days prior to admission. She presented to [facility] on 10/15/2014 after an episode of syncope. Apparently, patient was sitting on the toilet and straining due to severe constipation when she lost consciousness. She states she frequent has constipation but never passed out before. Family were able to catch her before she fell over so she is uninjured. She does not remember the event and cannot recall how long she was out. According to the IM note, her cough has been going on for at least 2 weeks. Initial chest x-ray was concerning for pneumonia given consolidation and possible effusion in the left base. She was placed on antibiotics. Nephrology was consulted for hemodialysis needed. Her syncope was felt to be vasovagal. Blood culture was obtained and negative to date. Her [place] blood cell count was mildly elevated at 14.6 on arrival which returned to normal on repeat test following abx. Serial troponin so far has been negative. Patient denies any chest discomfort throughout the entire event. Echocardiogram was obtained on the following day on 11/14/2014 which showed EF 45-50%, possible hypokinesis of basal inferolateral, inferior myocardium, septal motion abnormality and dyssynergy consistent with intraventricular conduction delay, mild AR, mild MR directed centrally, small pericardial effusion without hemodynamic compromise. Cardiology has been consulted for syncope and abnormal echo.  Of note, patient is a poor historian and has to be asked multiple times to answer question, appear to have difficulty  remembering  Inpatient Medications  . aspirin EC  81 mg Oral Daily  . azelastine  1 spray Each Nare Daily  . calcium acetate  667 mg Oral BID AC  . ceFEPime (MAXIPIME) IV  2 g Intravenous Q T,Th,Sat-1800  . docusate sodium  100 mg Oral Daily  . ferric gluconate (FERRLECIT/NULECIT) IV  125 mg Intravenous Once  . gabapentin  300 mg Oral QHS  . heparin  5,000 Units Subcutaneous 3 times per day  . hydrALAZINE  50 mg Oral BID  . multivitamin  1 tablet Oral QHS  . pantoprazole  40 mg Oral Daily  . polyethylene glycol  17 g Oral Daily  . simvastatin  5 mg Oral QHS  . vancomycin  1,000 mg Intravenous Q T,Th,Sa-HD    Family History Family History  Problem Relation Age of Onset  . Diabetes Mother   . Cancer Father   . Deep vein thrombosis Brother   . Hypertension Brother  Social History History   Social History  . Marital Status: Widowed    Spouse Name: N/A  . Number of Children: N/A  . Years of Education: N/A   Occupational History  . Not on file.   Social History Main Topics  . Smoking status: Never Smoker   . Smokeless tobacco: Never Used  . Alcohol Use: No  . Drug Use: No  . Sexual Activity: Not on file   Other Topics Concern  . Not on file   Social History Narrative     Review of Systems  General:  No chills, fever, night sweats or weight changes.  Cardiovascular:  No chest pain, dyspnea on exertion, edema, orthopnea, palpitations, paroxysmal nocturnal dyspnea. Dermatological: No rash, lesions/masses Respiratory: No dyspnea +cough Urologic: No hematuria, dysuria Abdominal:   No nausea, vomiting, diarrhea, bright red blood per rectum, melena, or hematemesis Neurologic:  No visual changes, wkns. +changes in mental status. All other systems reviewed and are otherwise negative except as noted above.  Physical Exam  Blood pressure 104/61, pulse 92, temperature 97.8 F (36.6 C), temperature source Oral, resp. rate 22, height 5\' 2"  (1.575 m), weight 158 lb  11.7 oz (72 kg), SpO2 93 %.  General: Pleasant, NAD Psych: Normal affect. Neuro: Alert and oriented X 3. Moves all extremities spontaneously. HEENT: Normal  Neck: Supple without bruits or JVD. Lungs:  Resp regular and unlabored, anterior exam CTA. Heart: RRR no s3, s4, or murmurs.  Abdomen: Soft, non-tender, non-distended, BS + x 4.  Extremities: No clubbing, cyanosis or edema. 2+ radial pulse on L, unable to assess R radial pulse on HD. R AVF noted, undergoing HD  Labs   Recent Labs  11/13/14 2110 11/14/14 2035 11/14/14 2306 11/15/14 0614  TROPONINI 0.03 0.03 0.03 0.03   Lab Results  Component Value Date   WBC 8.9 11/15/2014   HGB 8.9* 11/15/2014   HCT 27.3* 11/15/2014   MCV 83.5 11/15/2014   PLT 354 11/15/2014    Recent Labs Lab 11/13/14 2110 11/15/14 0614  NA 137 137  K 3.7 3.7  CL 94* 97  CO2 34* 26  BUN 14 29*  CREATININE 5.82* 8.60*  CALCIUM 9.2 9.0  PROT 8.8*  --   BILITOT 1.0  --   ALKPHOS 75  --   ALT 22  --   AST 28  --   GLUCOSE 161* 99    Radiology/Studies  Dg Chest 2 View  11/13/2014   CLINICAL DATA:  Cough for 2 days.  EXAM: CHEST  2 VIEW  COMPARISON:  10/27/2014  FINDINGS: There is consolidation in the left base, possibly a small left effusion. The right lung is clear. There is moderate cardiomegaly which appears worsened.  IMPRESSION: Increase size of the cardiac silhouette compared to 10/27/2014. This is indeterminate with regard to pericardial effusion versus cardiac enlargement.  Consolidation and possible effusion in the left base.   Electronically Signed   By: Andreas Newport M.D.   On: 11/13/2014 21:00   Dg Chest Port 1 View  10/27/2014   CLINICAL DATA:  Status post central line placement  EXAM: PORTABLE CHEST - 1 VIEW  COMPARISON:  10/26/2014  FINDINGS: Cardiac shadow is mildly enlarged but stable. A left jugular central venous line is again seen with the catheter tip in the proximal superior vena cava. No pneumothorax is noted. Patchy  infiltrative changes are noted in the left base slightly increased from the prior exam.  IMPRESSION: No pneumothorax.  Catheter placement as described.  Increasing left basilar infiltrate.   Electronically Signed   By: Inez Catalina M.D.   On: 10/27/2014 08:20    ECG  Right bundle branch block and left anterior fascicular block, no significant changes compared to the previous EKG in 2014.  ASSESSMENT AND PLAN  1. Syncope  - agree in light of her setting surrounding syncope, most likely vasovagal.   - telemetry has not shown significant ectopy to explain her symptom  - Echo borderline low EF with WMA, however no previous echo or outside record to compare. Although patient carry a diagnosis of CAD, she denies any h/o cath or stress test, she denies prior MI, she cannot why she saw Dr. Agustin Cree, she cannot recall when is the last time she saw a heart doctor.   - will discuss with MD, potentially outpatient stress test given lack of EKG changes, negative trop and no CP despite having possible hypokinesis of basal inferolateral, inferior myocardium with borderline low EF on echo   2. PNA: on abx  3. HTN 4. HLD 5. DM 6. COPD 7. CAD: no prior record, unknown history. Patient denies any prior cardiac workup, however she is a poor historian 8. ESRD on HD T/Th/Sat  Signed, Almyra Deforest, PA-C 11/15/2014, 3:24 PM Patient seen and examined and history reviewed. Agree with above findings and plan. Pleasant 81 yo BF admitted with a syncopal episode. She was constipated and straining at a stool. Afterwards she stood up and passed out. No chest pain. No SOB. She is a fairly poor historian. She denies any history of CAD or MI but has been evaluated by [facility] cardiology in the past. Records are not available. She has a normal cardiac exam. Ecg shows no change in RBBB, LAFB, and marked LVH. By Echo she does have mild LV dysfunction with inferior wall motion abnormality. This is suggestive of underlying CAD  and this would not be surprising at her age with ESRD, HTN, DM, and HL. Since she is asymptomatic I would not pursue any further cardiac work up. I think her syncope is vasovagal.  Peter Martinique, [place] 11/15/2014 4:41 PM

## 2014-11-15 NOTE — Progress Notes (Signed)
[facility] PROGRESS NOTE  Annette Roach LGX:[id number] DOB: 08/18/1928 DOA: 11/13/2014 PCP: Gilford Rile, MD  Assessment/Plan: 1-Syncope:  Suspect vaso-vagal.  ECHO with Ef 45 % , Right BBB. Will consult cardiology.  Enzymes negative.   2-HCAP; continue with cefepime and vancomycin.  WBC trending down.   Anemia of chronic diseases, renal failure. . Hb at 8.9. Hb at 9.8 last month.   ESRD; nephrologist consulted.   History stomach cancer; has appointment with oncologist,.   Constipation: Miralax, sorbitol.     Code Status: full code.  Family Communication: care discussed with daughter Disposition Plan: remain inpatient    Consultants:  nephrologist   Procedures:  ECHO; Ef 45 %    Antibiotics:  Cefepime 3-15  Vancomycin 3-15  HPI/Subjective: Had an episode of intentional tremors. Chronic problem.  Denies dyspnea.   Objective: Filed Vitals:   11/15/14 1021  BP: 123/45  Pulse: 95  Temp: 97.5 F (36.4 C)  Resp: 19    Intake/Output Summary (Last 24 hours) at 11/15/14 1423 Last data filed at 11/15/14 0949  Gross per 24 hour  Intake    720 ml  Output      1 ml  Net    719 ml   Filed Weights   11/13/14 1920 11/14/14 2054  Weight: 81.194 kg (179 lb) 81.963 kg (180 lb 11.1 oz)    Exam:   General: Alert in no distress.   Cardiovascular: S 1, S 2 RRR  Respiratory: CTA  Abdomen: BS present, soft, nt  Musculoskeletal: no edema.   Data Reviewed: Basic Metabolic Panel:  Recent Labs Lab 11/13/14 2110 11/15/14 0614  NA 137 137  K 3.7 3.7  CL 94* 97  CO2 34* 26  GLUCOSE 161* 99  BUN 14 29*  CREATININE 5.82* 8.60*  CALCIUM 9.2 9.0  PHOS  --  4.7*   Liver Function Tests:  Recent Labs Lab 11/13/14 2110 11/15/14 0614  AST 28  --   ALT 22  --   ALKPHOS 75  --   BILITOT 1.0  --   PROT 8.8*  --   ALBUMIN 3.0* 2.2*   No results for input(s): LIPASE, AMYLASE in the last 168 hours. No results for input(s): AMMONIA in the last  168 hours. CBC:  Recent Labs Lab 11/13/14 2110 11/15/14 0614  WBC 14.6* 8.9  NEUTROABS 13.0*  --   HGB 10.4* 8.9*  HCT 32.0* 27.3*  MCV 84.4 83.5  PLT 367 354   Cardiac Enzymes:  Recent Labs Lab 11/13/14 2110 11/14/14 2035 11/14/14 2306 11/15/14 0614  TROPONINI 0.03 0.03 0.03 0.03   BNP (last 3 results) No results for input(s): BNP in the last 8760 hours.  ProBNP (last 3 results) No results for input(s): PROBNP in the last 8760 hours.  CBG:  Recent Labs Lab 11/13/14 2001 11/14/14 0628  GLUCAP 127* 107*    Recent Results (from the past 240 hour(s))  Blood culture (routine x 2)     Status: None (Preliminary result)   Collection Time: 11/13/14 10:56 PM  Result Value Ref Range Status   Specimen Description BLOOD LEFT ANTECUBITAL  Final   Special Requests BOTTLES DRAWN AEROBIC AND ANAEROBIC 10CC EA  Final   Culture   Final           BLOOD CULTURE RECEIVED NO GROWTH TO DATE CULTURE WILL BE HELD FOR 5 DAYS BEFORE ISSUING A FINAL NEGATIVE REPORT Note: Culture results may be compromised due to an excessive volume of blood received in  culture bottles. Performed at [facility]    Report Status PENDING  Incomplete  Blood culture (routine x 2)     Status: None (Preliminary result)   Collection Time: 11/13/14 11:03 PM  Result Value Ref Range Status   Specimen Description BLOOD LEFT HAND  Final   Special Requests BOTTLES DRAWN AEROBIC AND ANAEROBIC 5CC EA  Final   Culture   Final           BLOOD CULTURE RECEIVED NO GROWTH TO DATE CULTURE WILL BE HELD FOR 5 DAYS BEFORE ISSUING A FINAL NEGATIVE REPORT Performed at [facility]    Report Status PENDING  Incomplete  MRSA PCR Screening     Status: None   Collection Time: 11/14/14 12:33 AM  Result Value Ref Range Status   MRSA by PCR NEGATIVE NEGATIVE Final    Comment:        The GeneXpert MRSA Assay (FDA approved for NASAL specimens only), is one component of a comprehensive MRSA  colonization surveillance program. It is not intended to diagnose MRSA infection nor to guide or monitor treatment for MRSA infections.      Studies: Dg Chest 2 View  11/13/2014   CLINICAL DATA:  Cough for 2 days.  EXAM: CHEST  2 VIEW  COMPARISON:  10/27/2014  FINDINGS: There is consolidation in the left base, possibly a small left effusion. The right lung is clear. There is moderate cardiomegaly which appears worsened.  IMPRESSION: Increase size of the cardiac silhouette compared to 10/27/2014. This is indeterminate with regard to pericardial effusion versus cardiac enlargement.  Consolidation and possible effusion in the left base.   Electronically Signed   By: Andreas Newport M.D.   On: 11/13/2014 21:00    Scheduled Meds: . aspirin EC  81 mg Oral Daily  . azelastine  1 spray Each Nare Daily  . calcium acetate  667 mg Oral BID AC  . ceFEPime (MAXIPIME) IV  2 g Intravenous Q T,Th,Sat-1800  . docusate sodium  100 mg Oral Daily  . ferric gluconate (FERRLECIT/NULECIT) IV  125 mg Intravenous Once  . gabapentin  300 mg Oral QHS  . heparin  5,000 Units Subcutaneous 3 times per day  . hydrALAZINE  50 mg Oral BID  . multivitamin  1 tablet Oral QHS  . pantoprazole  40 mg Oral Daily  . polyethylene glycol  17 g Oral Daily  . simvastatin  5 mg Oral QHS  . vancomycin  1,000 mg Intravenous Q T,Th,Sa-HD   Continuous Infusions:   Principal Problem:   HCAP (healthcare-associated pneumonia) Active Problems:   End stage renal disease on dialysis   Vasovagal syncope   Leukocytosis    Time spent: 35 minutes.     Niel Hummer A  [facility] Pager [phone]. If 7PM-7AM, please contact night-coverage at [email or web address], password [facility] 11/15/2014, 2:23 PM  LOS: 2 days

## 2014-11-15 NOTE — Progress Notes (Signed)
Subjective:  Hd for today / co chronic hand shakes dropped my coffee, not a new prob she relates  Objective Vital signs in last 24 hours: Filed Vitals:   11/14/14 0934 11/14/14 0939 11/14/14 2054 11/15/14 0500  BP: 128/56  129/50 132/55  Pulse:  89 95 95  Temp:  98.3 F (36.8 C) 99.5 F (37.5 C) 97.5 F (36.4 C)  TempSrc:  Oral Oral Oral  Resp:   21 20  Height:   5\' 2"  (1.575 m)   Weight:   81.963 kg (180 lb 11.1 oz)   SpO2: 100%  99% 97%   Weight change: 0.769 kg (1 lb 11.1 oz)  Physical Exam: General: Alert, pleasant elderly B F, NAD, appropriate Heart: RRR, no mur, rub, or gallop Lungs: decr at bases , otherwise CTA Abdomen: obese, bs pos. Soft NT, ND Extremities: no pedal edema Dialysis Access: pos. Bruit R UA AVGG  Dialysis Orders: Center: [facility] on TTS . EDW 71.5 HD Bath 2.0k, 2.0ca Time 3hrs 101min Heparin 5000. Access RA AVG BFR 400 DFR AF1.5  Calcitriol po 1.21mcg q/HD Epogen 0 Units IV/HD Venofer 50mg  iv q hd   Problem/Plan: 1. HCAP- rx per admit team on Cefepime / Vanco 2. ESRD - HD TTS - K 3.7  Hd today 3. Vasovagal Syncope- admit team rx 4. Hypertension/volume - bp 132/55/ fu bps / no excess vol. No uf on hd/ op and in pt  hydralazine 50mg  bid listed  Monitor bp with hd pt  5. Anemia - admit hgb 10.4/ fe weekly hd, no esa/ fu hgbs' 6. Metabolic bone disease - Po vit d on hd/ phoslo binder/ correcr ca 10.4 /phos 4.7 / dc  calitriol 7. COPD- home meds per admit  8. DM type 2- diet controlled in past 9. Constipation- Miralax 10. Myoclonus type symptoms of upper extrem = may benefit low dose Klonopin  BUT hold for now with SYNCOPE on ADmit/  And  On Neurontin 300 mg hs/needs PT input   Ernest Haber, PA-C [facility] [phone] 11/15/2014,10:12 AM  LOS: 2 days   Labs: Basic Metabolic Panel:  Recent Labs Lab 11/13/14 2110 11/15/14 0614  NA 137 137  K 3.7 3.7  CL 94* 97  CO2 34* 26  GLUCOSE 161* 99  BUN 14 29*   CREATININE 5.82* 8.60*  CALCIUM 9.2 9.0  PHOS  --  4.7*   Liver Function Tests:  Recent Labs Lab 11/13/14 2110 11/15/14 0614  AST 28  --   ALT 22  --   ALKPHOS 75  --   BILITOT 1.0  --   PROT 8.8*  --   ALBUMIN 3.0* 2.2*     Recent Labs Lab 11/13/14 2110 11/15/14 0614  WBC 14.6* 8.9  NEUTROABS 13.0*  --   HGB 10.4* 8.9*  HCT 32.0* 27.3*  MCV 84.4 83.5  PLT 367 354   Cardiac Enzymes:  Recent Labs Lab 11/13/14 2110 11/14/14 2035 11/14/14 2306 11/15/14 0614  TROPONINI 0.03 0.03 0.03 0.03   CBG:  Recent Labs Lab 11/13/14 2001 11/14/14 0628  GLUCAP 127* 107*    Studies/Results: Dg Chest 2 View  11/13/2014   CLINICAL DATA:  Cough for 2 days.  EXAM: CHEST  2 VIEW  COMPARISON:  10/27/2014  FINDINGS: There is consolidation in the left base, possibly a small left effusion. The right lung is clear. There is moderate cardiomegaly which appears worsened.  IMPRESSION: Increase size of the cardiac silhouette compared to 10/27/2014. This is  indeterminate with regard to pericardial effusion versus cardiac enlargement.  Consolidation and possible effusion in the left base.   Electronically Signed   By: Andreas Newport M.D.   On: 11/13/2014 21:00   Medications:   . aspirin EC  81 mg Oral Daily  . azelastine  1 spray Each Nare Daily  . calcium acetate  667 mg Oral BID AC  . ceFEPime (MAXIPIME) IV  2 g Intravenous Q T,Th,Sat-1800  . docusate sodium  100 mg Oral Daily  . ferric gluconate (FERRLECIT/NULECIT) IV  125 mg Intravenous Once  . gabapentin  300 mg Oral QHS  . heparin  5,000 Units Subcutaneous 3 times per day  . hydrALAZINE  50 mg Oral BID  . multivitamin  1 tablet Oral QHS  . pantoprazole  40 mg Oral Daily  . polyethylene glycol  17 g Oral Daily  . simvastatin  5 mg Oral QHS  . vancomycin  1,000 mg Intravenous Q T,Th,Sa-HD    I have seen and examined this patient and agree with the plan of care. On for dialysis today. Stable being treated for PNA.    Dwana Melena, MD 11/17/2014, 1:17 PM

## 2014-11-15 NOTE — Progress Notes (Signed)
Patient was seen on dialysis and the procedure was supervised.   BFR 350    Via RUA arc AVG  BP is 125/70.  AP -190  VP 120 UF 2.5 liters Bath 4k  Patient appears to be tolerating treatment well. No changes to regimen.  Otelia Santee, MD 11/15/2014, 2:38 PM

## 2014-11-15 NOTE — Clinical Documentation Improvement (Signed)
MD's, NP's, and, PA"s  Noted patient's H/H on admit 9.8/31.4 and 10.4/32.0 with diagnosis of Anemia, please provide type if known.  Thank you   Possible Clinical Conditions?  Acute Blood Loss Anemia Aplastic anemia Pernicious Iron Deficiency Anemia  Precipitous drop in Hematocrit Acute on chronic blood loss anemia  Other Condition  Cannot Clinically Determine    Risk Factors: ESRD   Diagnostics:CBC  Treatments: IV Ferric Gluconate  Thank You, Ree Kida ,RN Clinical Documentation Specialist:  [phone]  [place] Information Management

## 2014-11-16 MED ORDER — LEVOFLOXACIN 250 MG PO TABS: 250.0000 mg | ORAL_TABLET | Freq: Every day | ORAL | Status: DC

## 2014-11-16 MED ORDER — DARBEPOETIN ALFA 60 MCG/0.3ML IJ SOSY
60.0000 ug | PREFILLED_SYRINGE | INTRAMUSCULAR | Status: DC
  Filled 2014-11-16: qty 0.3

## 2014-11-16 NOTE — Discharge Summary (Signed)
Physician Discharge Summary  Annette Roach RKY:[id number] DOB: 1928-02-01 DOA: 11/13/2014  PCP: Gilford Rile, MD  Admit date: 11/13/2014 Discharge date: 11/16/2014  Time spent: 35 minutes  Recommendations for Outpatient Follow-up:  Need repeat HB Follow with primary oncologist for stomach cancer.  Follow up with primary cardiologist for further evaluation of HF   Discharge Diagnoses:    HCAP (healthcare-associated pneumonia)   Vasovagal syncope   End stage renal disease on dialysis   Leukocytosis   Discharge Condition: Stable.   Diet recommendation: Renal diet.   Filed Weights   11/15/14 1331 11/15/14 1735 11/15/14 2158  Weight: 72 kg (158 lb 11.7 oz) 68.9 kg (151 lb 14.4 oz) 70.5 kg (155 lb 6.8 oz)    History of present illness:  Annette Roach is a 81 y.o. female who presents to the ED with rectal pain and syncope. Patient developed severe constipation earlier today, was straining to have a BM and had LOC while on the toilet. Thankfully family were able to catch her before she fell so is uninjured.  In addition to a rectal fissure identified by the EDP, patient also has had 2 week history of non-productive cough that has been persistent.  Hospital Course:  1-Syncope:  Suspect vaso-vagal.  ECHO with Ef 45 % , Right BBB. Will consult cardiology. no further evaluation per cardio.  Enzymes negative.   2-HCAP; continue with cefepime and vancomycin. discharge on Levaquin for 5 more days.  WBC trending down.   Anemia of chronic diseases, renal failure. . Hb at 8.9. Hb at 9.8 last month.   ESRD; nephrologist consulted.   History stomach cancer; has appointment with oncologist,.   Constipation: Miralax, sorbitol.   Procedures:  ECHO; Left ventricle: The cavity size was normal. There was moderate concentric hypertrophy. Systolic function was mildly reduced. The estimated ejection fraction was in the range of 45% to 50%. Possible hypokinesis of the  basalinferolateral and inferior myocardium. There was fusion of early and atrial contributions to ventricular filling due to delayed AV conduction, The study is not technically sufficient to allow evaluation of LV diastolic function. - Ventricular septum: Septal motion showed abnormal function and dyssynergy. These changes are consistent with intraventricular conduction delay. - Aortic valve: There was mild regurgitation. - Mitral valve: There was mild regurgitation directed centrally. Diastolic regurgitation was present. - Left atrium: The atrium was mildly dilated. - Right ventricle: Systolic function was mildly reduced. - Pericardium, extracardiac: A small pericardial effusion was identified circumferential to the heart. There was no evidence of hemodynamic compromise.  Consultations:  Cardiology  Nephrologist.   Discharge Exam: Filed Vitals:   11/16/14 0900  BP: 110/52  Pulse: 92  Temp: 98.3 F (36.8 C)  Resp:     General: Alert in no distress.  Cardiovascular: S 1, S 2 RRR Respiratory: CTA  Discharge Instructions    Current Discharge Medication List    START taking these medications   Details  levofloxacin (LEVAQUIN) 250 MG tablet Take 1 tablet (250 mg total) by mouth daily. Qty: 5 tablet, Refills: 0      CONTINUE these medications which have NOT CHANGED   Details  albuterol (PROVENTIL) (2.5 MG/3ML) 0.083% nebulizer solution Take 2.5 mg by nebulization every 6 (six) hours as needed for wheezing or shortness of breath.    aspirin EC 81 MG tablet Take 81 mg by mouth daily.    Azelastine HCl 0.15 % SOLN Place 1 spray into both nostrils daily.  Refills: 3    calcium acetate (  PHOSLO) 667 MG capsule Take 667 mg by mouth 2 (two) times daily before a meal.     Docusate Calcium (STOOL SOFTENER PO) Take 1 tablet by mouth as needed (for constipation).    gabapentin (NEURONTIN) 300 MG capsule Take 300 mg by mouth at bedtime.    hydrALAZINE  (APRESOLINE) 100 MG tablet Take 50 mg by mouth 2 (two) times daily.     HYDROcodone-acetaminophen (NORCO) 10-325 MG per tablet Take 1 tablet by mouth every 6 (six) hours as needed for severe pain. Qty: 15 tablet, Refills: 0    multivitamin (RENA-VIT) TABS tablet Take 1 tablet by mouth daily.    nitroGLYCERIN (NITROSTAT) 0.4 MG SL tablet Place 0.4 mg under the tongue every 5 (five) minutes as needed for chest pain.    pantoprazole (PROTONIX) 40 MG tablet Take 40 mg by mouth daily.    polyethylene glycol (MIRALAX / GLYCOLAX) packet Take 17 g by mouth daily.    simvastatin (ZOCOR) 5 MG tablet Take 5 mg by mouth at bedtime.     traMADol (ULTRAM) 50 MG tablet Take 50 mg by mouth every 6 (six) hours as needed. for pain Refills: 0       No Known Allergies Follow-up Information    Follow up with Gilford Rile, MD In 1 week.   Specialty:  Internal Medicine   Contact information:   [place] [address] [phone]        The results of significant diagnostics from this hospitalization (including imaging, microbiology, ancillary and laboratory) are listed below for reference.    Significant Diagnostic Studies: Dg Chest 2 View  11/13/2014   CLINICAL DATA:  Cough for 2 days.  EXAM: CHEST  2 VIEW  COMPARISON:  10/27/2014  FINDINGS: There is consolidation in the left base, possibly a small left effusion. The right lung is clear. There is moderate cardiomegaly which appears worsened.  IMPRESSION: Increase size of the cardiac silhouette compared to 10/27/2014. This is indeterminate with regard to pericardial effusion versus cardiac enlargement.  Consolidation and possible effusion in the left base.   Electronically Signed   By: Andreas Newport M.D.   On: 11/13/2014 21:00   Dg Chest Port 1 View  10/27/2014   CLINICAL DATA:  Status post central line placement  EXAM: PORTABLE CHEST - 1 VIEW  COMPARISON:  10/26/2014  FINDINGS: Cardiac shadow is mildly enlarged but stable. A left  jugular central venous line is again seen with the catheter tip in the proximal superior vena cava. No pneumothorax is noted. Patchy infiltrative changes are noted in the left base slightly increased from the prior exam.  IMPRESSION: No pneumothorax.  Catheter placement as described.  Increasing left basilar infiltrate.   Electronically Signed   By: Inez Catalina M.D.   On: 10/27/2014 08:20    Microbiology: Recent Results (from the past 240 hour(s))  Blood culture (routine x 2)     Status: None (Preliminary result)   Collection Time: 11/13/14 10:56 PM  Result Value Ref Range Status   Specimen Description BLOOD LEFT ANTECUBITAL  Final   Special Requests BOTTLES DRAWN AEROBIC AND ANAEROBIC 10CC EA  Final   Culture   Final           BLOOD CULTURE RECEIVED NO GROWTH TO DATE CULTURE WILL BE HELD FOR 5 DAYS BEFORE ISSUING A FINAL NEGATIVE REPORT Note: Culture results may be compromised due to an excessive volume of blood received in culture bottles. Performed at [facility]  Report Status PENDING  Incomplete  Blood culture (routine x 2)     Status: None (Preliminary result)   Collection Time: 11/13/14 11:03 PM  Result Value Ref Range Status   Specimen Description BLOOD LEFT HAND  Final   Special Requests BOTTLES DRAWN AEROBIC AND ANAEROBIC 5CC EA  Final   Culture   Final           BLOOD CULTURE RECEIVED NO GROWTH TO DATE CULTURE WILL BE HELD FOR 5 DAYS BEFORE ISSUING A FINAL NEGATIVE REPORT Performed at [facility]    Report Status PENDING  Incomplete  MRSA PCR Screening     Status: None   Collection Time: 11/14/14 12:33 AM  Result Value Ref Range Status   MRSA by PCR NEGATIVE NEGATIVE Final    Comment:        The GeneXpert MRSA Assay (FDA approved for NASAL specimens only), is one component of a comprehensive MRSA colonization surveillance program. It is not intended to diagnose MRSA infection nor to guide or monitor treatment for MRSA infections.       Labs: Basic Metabolic Panel:  Recent Labs Lab 11/13/14 2110 11/15/14 0614  NA 137 137  K 3.7 3.7  CL 94* 97  CO2 34* 26  GLUCOSE 161* 99  BUN 14 29*  CREATININE 5.82* 8.60*  CALCIUM 9.2 9.0  PHOS  --  4.7*   Liver Function Tests:  Recent Labs Lab 11/13/14 2110 11/15/14 0614  AST 28  --   ALT 22  --   ALKPHOS 75  --   BILITOT 1.0  --   PROT 8.8*  --   ALBUMIN 3.0* 2.2*   No results for input(s): LIPASE, AMYLASE in the last 168 hours. No results for input(s): AMMONIA in the last 168 hours. CBC:  Recent Labs Lab 11/13/14 2110 11/15/14 0614  WBC 14.6* 8.9  NEUTROABS 13.0*  --   HGB 10.4* 8.9*  HCT 32.0* 27.3*  MCV 84.4 83.5  PLT 367 354   Cardiac Enzymes:  Recent Labs Lab 11/13/14 2110 11/14/14 2035 11/14/14 2306 11/15/14 0614  TROPONINI 0.03 0.03 0.03 0.03   BNP: BNP (last 3 results) No results for input(s): BNP in the last 8760 hours.  ProBNP (last 3 results) No results for input(s): PROBNP in the last 8760 hours.  CBG:  Recent Labs Lab 11/13/14 2001 11/14/14 0628  GLUCAP 127* 107*       Signed:  Makya Yurko A  [facility] 11/16/2014, 11:36 AM

## 2014-11-16 NOTE — Clinical Social Work Psychosocial (Signed)
Clinical Social Work Department BRIEF PSYCHOSOCIAL ASSESSMENT 11/16/2014  Patient:  Annette Roach, Annette Roach     Account Number:  [id number]     Admit date:  11/13/2014  Clinical Social Worker:  Frederico Hamman  Date/Time:  11/16/2014 06:37 AM  Referred by:  Physician  Date Referred:  11/16/2014 Referred for  SNF Placement   Other Referral:   Interview type:  Family Other interview type:    PSYCHOSOCIAL DATA Living Status:  FAMILY Admitted from facility:   Level of care:   Primary support name:  Chelsey Redondo Primary support relationship to patient:  CHILD, ADULT Degree of support available:   Daughter very caring and supportive of patient and wants the best care for her. Contact information: [phone].    CURRENT CONCERNS Current Concerns  Post-Acute Placement   Other Concerns:    SOCIAL WORK ASSESSMENT / PLAN CSW talked with daughter at the bedside after 5 pm regarding discharge planning and recommendation of ST rehab. Patient was awake and appeared alert and was listening to conversation but did not participate. Daughter, Maricia Scotti reported that she had been told that inpatient rehab was going to be alerted about patient and this is her preference. She appeared frustrated and voiced being told different things concerning her mother and the discharge plan and CSW apologized and attempted to address her concerns. CSW informed daughter that she will f/u on this. Daughter agreeable to SNF if patient cannot go to CIR and her facility preference is [facility].    CSW talked with patient's bedside nurse regarding daughter's desire for CIR and asked nurse to talk with daughter when she returns, as he had left when CSW returned to room. CSW also checked EPIC notes and PT recommended SNF, not CIR for patient.   Assessment/plan status:  Psychosocial Support/Ongoing Assessment of Needs Other assessment/ plan:   Information/referral to community resources:   Daughter provided with  skilled facility list for [facility].    PATIENT'S/FAMILY'S RESPONSE TO PLAN OF CARE: Daughter was frustrated with the different things she has been told regarding patient's discharge plan. She is agreeable to SNF if CIR not an option and preference is [facility].       Vickii Volland Givens, MSW, LCSW Licensed Clinical Social Worker [place] [phone]

## 2014-11-16 NOTE — Evaluation (Signed)
Physical Therapy Evaluation Patient Details Name: Annette Roach MRN: [id number] DOB: Jun 04, 1928 Today's Date: 11/16/2014   History of Present Illness  Patient is a 81 y/o female who presents to the ED with rectal pain and syncope.Pt developed severe constipation earlier today, was straining to have a BM and had LOC while on the toilet.Family able to catch her before she fell so is uninjured. PMH of HTN, HLD, DM, COPD, history of angina/CAD and ESRD on HD T/Th/Sat. CXT- PNA.     Clinical Impression  Patient presents with marked weakness in BUEs/LEs/trunk and balance deficits impacting mobility. Requires external support to maintain sitting balance EOB and unable to stand due to buckling of Bil knees. Lengthy discussion with family and pt about need for ST SNF. Family seems open. Pt would benefit from skilled PT and ST SNF to improve transfers, gait, balance and mobility so pt can ease burden of care, minimize fall risk and maximize independence prior to return home.    Follow Up Recommendations SNF;Supervision/Assistance - 24 hour    Equipment Recommendations  None recommended by PT    Recommendations for Other Services OT consult     Precautions / Restrictions Precautions Precautions: Fall Precaution Comments: h/o R ankle fx, reports has been using RW and cam boot  Restrictions Weight Bearing Restrictions: No      Mobility  Bed Mobility Overal bed mobility: Needs Assistance Bed Mobility: Supine to Sit;Sit to Supine     Supine to sit: Min assist Sit to supine: Min guard   General bed mobility comments: Min A to get to EOB. Increased time. RUE giving out when trying to use it to get to EOB. Able to help reposition self with max cues.   Transfers Overall transfer level: Needs assistance Equipment used: Rolling walker (2 wheeled) Transfers: Sit to/from Stand Sit to Stand: Mod assist         General transfer comment: Mod A to rise from EOB x5 with knee buckling each time  resulting in LOB onto bed. Cues for knee/hip extension and upright however not able to get fully upright. Therapist providing knee buckling LLE however continued to buckle.  Ambulation/Gait             General Gait Details: NA secondary to poor standing tolerance and weakness.  Stairs            Wheelchair Mobility    Modified Rankin (Stroke Patients Only)       Balance Overall balance assessment: Needs assistance;History of Falls Sitting-balance support: Feet supported;No upper extremity supported Sitting balance-Leahy Scale: Zero Sitting balance - Comments: Requires constant Min A to maintain sitting balance EOB.    Standing balance support: During functional activity Standing balance-Leahy Scale: Zero                               Pertinent Vitals/Pain Pain Assessment: No/denies pain    Home Living Family/patient expects to be discharged to:: Private residence Living Arrangements: Children Available Help at Discharge: Family Type of Home: House Home Access: Stairs to enter Entrance Stairs-Rails: Psychiatric nurse of Steps: 3 + 5  Home Layout: One level Home Equipment: Walker - 2 wheels;Cane - single point;Bedside commode;Shower seat      Prior Function Level of Independence: Needs assistance   Gait / Transfers Assistance Needed: Pt reports ambulation with RW PTA with multiple falls in the last month. has someone with her when ambulating due to  weakness.   ADL's / Homemaking Assistance Needed: Assist for all ADLs and IADLs. Daughter and aide assist.        Hand Dominance   Dominant Hand: Right    Extremity/Trunk Assessment   Upper Extremity Assessment: Defer to OT evaluation;Generalized weakness           Lower Extremity Assessment: Generalized weakness RLE Deficits / Details: H/o RLE ankle fx; increased trembling in BLEs upon standing with Bil knee buckling and not able to stand upright.       Communication    Communication: No difficulties  Cognition Arousal/Alertness: Awake/alert Behavior During Therapy: Flat affect Overall Cognitive Status: Within Functional Limits for tasks assessed (Does not have hearing aids in.)                      General Comments      Exercises        Assessment/Plan    PT Assessment Patient needs continued PT services  PT Diagnosis Difficulty walking;Generalized weakness   PT Problem List Decreased strength;Decreased activity tolerance;Decreased balance;Decreased mobility  PT Treatment Interventions Gait training;Patient/family education;Functional mobility training;Therapeutic exercise;Therapeutic activities;Balance training   PT Goals (Current goals can be found in the Care Plan section) Acute Rehab PT Goals Patient Stated Goal: to go home PT Goal Formulation: With patient Time For Goal Achievement: 11/30/14 Potential to Achieve Goals: Fair    Frequency Min 2X/week   Barriers to discharge Inaccessible home environment Pt has steps to climb to get into home andpt has dialysis 3 times per week.    Co-evaluation               End of Session Equipment Utilized During Treatment: Gait belt Activity Tolerance: Patient limited by fatigue Patient left: in bed;with call bell/phone within reach;with bed alarm set;with family/visitor present Nurse Communication: Mobility status         Time: 2376-2831 PT Time Calculation (min) (ACUTE ONLY): 28 min   Charges:   PT Evaluation $Initial PT Evaluation Tier I: 1 Procedure PT Treatments $Self Care/Home Management: 8-22   PT G CodesCandy Sledge A 12/14/14, 3:05 PM Candy Sledge, [place], [place]

## 2014-11-16 NOTE — Progress Notes (Signed)
   No recurrent syncope.  I would not pursue ischemic evaluation unless she develops angina, given her age and co-morbidities.  Please call if we can help further.

## 2014-11-16 NOTE — Progress Notes (Signed)
Subjective:   No complaints, feeling better.   Objective Filed Vitals:   11/15/14 1845 11/15/14 2158 11/16/14 0557 11/16/14 0857  BP: 118/64 113/52 107/53 128/48  Pulse: 96 99 91 89  Temp: 98.2 F (36.8 C) 98.9 F (37.2 C) 99.1 F (37.3 C) 98.6 F (37 C)  TempSrc: Oral Oral Oral Oral  Resp: 17 16 17 17   Height:      Weight:  70.5 kg (155 lb 6.8 oz)    SpO2: 95% 97% 97% 97%   Physical Exam General: Alert and oriented, no acute distress.  Heart: RRR Lungs: dim bases, CTA, unlabored Abdomen: soft, nontender +BS Extremities: no edema  Dialysis Access:  R AVG +b.t  Dialysis Orders: Center: [facility] on TTS . EDW 71.5 HD Bath 2.0k, 2.0ca Time 3hrs 77min Heparin 5000. Access RA AVG BFR 400 DFR AF1.5  Calcitriol po 1.10mcg q/HD Epogen 0 Units IV/HD Venofer 50mg  iv q hd  Problem/Plan: 1. HCAP- rx per admit team on Cefepime / Vanco. Afebrile.  2. ESRD - HD TTS - HD pending tomorrow 3. Vasovagal Syncope- cardiology consulted. ECHO EF 45-50-%- potentially for outpt stress test. No further symptoms 4. Hypertension/volume - bp 128/48  / 1kg under edw. Hydralazine 50 mg BID 5. Anemia - admit hgb 10.4- now 8.9/ fe weekly hd, no esa outpt, will start low dose/ watch CBC 6. Metabolic bone disease - Po vit d on hd/ phoslo binder/ correcr ca 10.4 /phos 4.7 / dc calitriol 7. COPD- home meds per admit  8. DM type 2- diet controlled in past 9. Constipation- Miralax 10. Myoclonus type symptoms of upper extrem = may benefit low dose Klonopin BUT hold for now with SYNCOPE on ADmit/ And On Neurontin 300 mg hs/needs PT input  Shelle Iron, NP [place] [phone] 11/16/2014,9:13 AM  LOS: 3 days   I have seen and examined this patient and agree with the plan of care. Doing well and no further episodes of syncope. Denies chest pain/f/c/n/v. Will HD tomorrow if she's still here.  Dwana Melena, MD 11/16/2014, 1:14 PM  Additional Objective Labs: Basic  Metabolic Panel:  Recent Labs Lab 11/13/14 2110 11/15/14 0614  NA 137 137  K 3.7 3.7  CL 94* 97  CO2 34* 26  GLUCOSE 161* 99  BUN 14 29*  CREATININE 5.82* 8.60*  CALCIUM 9.2 9.0  PHOS  --  4.7*   Liver Function Tests:  Recent Labs Lab 11/13/14 2110 11/15/14 0614  AST 28  --   ALT 22  --   ALKPHOS 75  --   BILITOT 1.0  --   PROT 8.8*  --   ALBUMIN 3.0* 2.2*   No results for input(s): LIPASE, AMYLASE in the last 168 hours. CBC:  Recent Labs Lab 11/13/14 2110 11/15/14 0614  WBC 14.6* 8.9  NEUTROABS 13.0*  --   HGB 10.4* 8.9*  HCT 32.0* 27.3*  MCV 84.4 83.5  PLT 367 354   Blood Culture    Component Value Date/Time   SDES BLOOD LEFT HAND 11/13/2014 2303   SPECREQUEST BOTTLES DRAWN AEROBIC AND ANAEROBIC 5CC EA 11/13/2014 2303   CULT  11/13/2014 2303           BLOOD CULTURE RECEIVED NO GROWTH TO DATE CULTURE WILL BE HELD FOR 5 DAYS BEFORE ISSUING A FINAL NEGATIVE REPORT Performed at [facility] PENDING 11/13/2014 2303    Cardiac Enzymes:  Recent Labs Lab 11/13/14 2110 11/14/14 2035 11/14/14 2306 11/15/14  2956  TROPONINI 0.03 0.03 0.03 0.03   CBG:  Recent Labs Lab 11/13/14 2001 11/14/14 0628  GLUCAP 127* 107*   Iron Studies: No results for input(s): IRON, TIBC, TRANSFERRIN, FERRITIN in the last 72 hours. @lablastinr3 @ Studies/Results: No results found. Medications:   . aspirin EC  81 mg Oral Daily  . azelastine  1 spray Each Nare Daily  . calcium acetate  667 mg Oral BID AC  . ceFEPime (MAXIPIME) IV  2 g Intravenous Q T,Th,Sat-1800  . docusate sodium  100 mg Oral Daily  . gabapentin  300 mg Oral QHS  . heparin  5,000 Units Subcutaneous 3 times per day  . hydrALAZINE  50 mg Oral BID  . multivitamin  1 tablet Oral QHS  . pantoprazole  40 mg Oral Daily  . polyethylene glycol  17 g Oral Daily  . simvastatin  5 mg Oral QHS  . vancomycin  1,000 mg Intravenous Q T,Th,Sa-HD

## 2014-11-16 NOTE — Care Management Note (Signed)
CARE MANAGEMENT NOTE 11/16/2014  Patient:  Annette Roach, Annette Roach   Account Number:  [id number]  Date Initiated:  11/16/2014  Documentation initiated by:  Jibran Crookshanks  Subjective/Objective Assessment:   CM following for progression and d/c planning.     Action/Plan:   Met with pt family , plan is to return to home, following for d/c needs.   Anticipated DC Date:  11/18/2014   Anticipated DC Plan:  [place]         Choice offered to / List presented to:             Status of service:  In process, will continue to follow Medicare Important Message given?  YES (If response is "NO", the following Medicare IM given date fields will be blank) Date Medicare IM given:  11/16/2014 Medicare IM given by:  Dearion Huot Date Additional Medicare IM given:   Additional Medicare IM given by:    Discharge Disposition:    Per UR Regulation:    If discussed at Long Length of Stay Meetings, dates discussed:    Comments:

## 2014-11-16 NOTE — Progress Notes (Signed)
ANTIBIOTIC CONSULT NOTE  Pharmacy Consult for Vancomycin/Cefepime  Indication: rule out pneumonia  No Known Allergies  Patient Measurements: Height: 5\' 2"  (157.5 cm) Weight: 155 lb 6.8 oz (70.5 kg) IBW/kg (Calculated) : 50.1  Vital Signs: Temp: 98.3 F (36.8 C) (03/18 0900) Temp Source: Oral (03/18 0900) BP: 110/52 mmHg (03/18 0900) Pulse Rate: 92 (03/18 0900)  Labs:  Recent Labs  11/13/14 2110 11/15/14 0614  WBC 14.6* 8.9  HGB 10.4* 8.9*  PLT 367 354  CREATININE 5.82* 8.60*   Estimated Creatinine Clearance: 4.4 mL/min (by C-G formula based on Cr of 8.6).   Assessment: 81 y/o F here with syncope, possible infiltrate on CXR, now afebrile and with WBC normal.  Received vancomycin 1500mg  IV x1 in ED on 3/15- slightly low for a loading dose for this patient based on HD calculations. Last HD session 3/17 (on schedule- normally TTSat)- tolerated a full session. No doses of vancomycin or cefepime have been missed.  Goal of Therapy:  Pre-HD vancomycin level 15-25 mg/L  Plan:  -Vancomycin 1000 mg IV qHD Tues/Thurs/Sat -Cefepime 2g IV q1800 on Tues/Thurs/Sat -Trend WBC, temp, HD schedule, LOT of antibiotics, pre-HD vanc level PRN   Gelene Recktenwald D. Dory Verdun, PharmD, BCPS Clinical Pharmacist Pager: [phone] 11/16/2014 10:42 AM

## 2014-11-17 ENCOUNTER — Inpatient Hospital Stay (HOSPITAL_COMMUNITY): Payer: Medicare Other

## 2014-11-17 DIAGNOSIS — R001 Bradycardia, unspecified: Secondary | ICD-10-CM

## 2014-11-17 LAB — BASIC METABOLIC PANEL
Anion gap: 11 (ref 5–15)
BUN: 25 mg/dL — AB (ref 6–23)
CO2: 26 mmol/L (ref 19–32)
CREATININE: 7.74 mg/dL — AB (ref 0.50–1.10)
Calcium: 9.2 mg/dL (ref 8.4–10.5)
Chloride: 98 mmol/L (ref 96–112)
GFR calc Af Amer: 5 mL/min — ABNORMAL LOW (ref >90)
GFR calc non Af Amer: 4 mL/min — ABNORMAL LOW (ref >90)
Glucose, Bld: 111 mg/dL — ABNORMAL HIGH (ref 70–99)
Potassium: 4.7 mmol/L (ref 3.5–5.1)
Sodium: 135 mmol/L (ref 135–145)

## 2014-11-17 LAB — CBC
HEMATOCRIT: 28.1 % — AB (ref 36.0–46.0)
Hemoglobin: 9.1 g/dL — ABNORMAL LOW (ref 12.0–15.0)
MCH: 27 pg (ref 26.0–34.0)
MCHC: 32.4 g/dL (ref 30.0–36.0)
MCV: 83.4 fL (ref 78.0–100.0)
Platelets: 370 10*3/uL (ref 150–400)
RBC: 3.37 MIL/uL — AB (ref 3.87–5.11)
RDW: 17.3 % — ABNORMAL HIGH (ref 11.5–15.5)
WBC: 8.9 10*3/uL (ref 4.0–10.5)

## 2014-11-17 LAB — TROPONIN I
Troponin I: 0.04 ng/mL — ABNORMAL HIGH (ref <0.031)
Troponin I: 0.04 ng/mL — ABNORMAL HIGH (ref <0.031)

## 2014-11-17 LAB — MRSA PCR SCREENING: MRSA by PCR: NEGATIVE

## 2014-11-17 LAB — GLUCOSE, CAPILLARY: GLUCOSE-CAPILLARY: 128 mg/dL — AB (ref 70–99)

## 2014-11-17 MED ORDER — LIDOCAINE-PRILOCAINE 2.5-2.5 % EX CREA: 1.0000 "application " | TOPICAL_CREAM | CUTANEOUS | Status: DC | PRN

## 2014-11-17 MED ORDER — SODIUM CHLORIDE 0.9 % IV SOLN: 100.0000 mL | INTRAVENOUS | Status: DC | PRN

## 2014-11-17 MED ORDER — PENTAFLUOROPROP-TETRAFLUOROETH EX AERO: 1.0000 "application " | INHALATION_SPRAY | CUTANEOUS | Status: DC | PRN

## 2014-11-17 MED ORDER — NEPRO/CARBSTEADY PO LIQD: 237.0000 mL | ORAL | Status: DC | PRN

## 2014-11-17 MED ORDER — HEPARIN SODIUM (PORCINE) 1000 UNIT/ML DIALYSIS: 5000.0000 [IU] | Freq: Once | INTRAMUSCULAR | Status: DC

## 2014-11-17 MED ORDER — PIPERACILLIN-TAZOBACTAM IN DEX 2-0.25 GM/50ML IV SOLN
2.2500 g | Freq: Three times a day (TID) | INTRAVENOUS | Status: DC
  Administered 2014-11-17 – 2014-11-20 (×10): 2.25 g via INTRAVENOUS
  Filled 2014-11-17 (×12): qty 50

## 2014-11-17 MED ORDER — LIDOCAINE HCL (PF) 1 % IJ SOLN: 5.0000 mL | INTRAMUSCULAR | Status: DC | PRN

## 2014-11-17 MED ORDER — HEPARIN SODIUM (PORCINE) 1000 UNIT/ML DIALYSIS: 1000.0000 [IU] | INTRAMUSCULAR | Status: DC | PRN

## 2014-11-17 MED ORDER — ALTEPLASE 2 MG IJ SOLR
2.0000 mg | Freq: Once | INTRAMUSCULAR | Status: DC | PRN
Start: 2014-11-17 — End: 2014-11-17
  Filled 2014-11-17: qty 2

## 2014-11-17 MED ORDER — SODIUM CHLORIDE 0.9 % IV SOLN
100.0000 mL | INTRAVENOUS | Status: DC | PRN
Start: 2014-11-17 — End: 2014-11-17

## 2014-11-17 NOTE — Progress Notes (Signed)
[facility] PROGRESS NOTE  Annette Roach JJH:[id number] DOB: [birth year] DOA: 11/13/2014 PCP: Gilford Rile, MD  Assessment/Plan:  1-Syncope:  ECHO with Ef 45 % , Right BBB. Cardiology consulted.  Patient during dialysis 3-19 became hypotensive SBP in the 50, HR in the 30. Patient was unresponsive.  Received IV bolus, vitals at this time stable.  I have informed cardiology of new events. Might need further inpatient work up.  Cycle Enzymes again.   2-HCAP;  continue with  vancomycin.  WBC trending down.  -vomiting episodes, concern for aspiration.  -Will repeat Chest x ray.  -Will change cefepime to Zosyn to cover for anaerobes.   Encephalopathy:  Will check CT head.  Neurology consulted.   Anemia of chronic diseases, renal failure. . Hb at 8.9. Hb at 9.8 last month.   ESRD; nephrologist Following.   History stomach cancer; has appointment with oncologist,.   Constipation: Miralax, sorbitol. Had BM.     Code Status: full code.  Family Communication; no family at bedside.  Disposition Plan: remain inpatient    Consultants:  nephrologist   Cardiology  neurologist  Procedures:  ECHO; Ef 45 %    Antibiotics:  Cefepime 3-15---3-19  Zosyn 3-19  Vancomycin 3-15  HPI/Subjective: Alert, follow some command, speech soft, only speak when questions ask.    Objective: Filed Vitals:   11/17/14 0900  BP: 122/68  Pulse: 95  Temp:   Resp:     Intake/Output Summary (Last 24 hours) at 11/17/14 0922 Last data filed at 11/17/14 0805  Gross per 24 hour  Intake    420 ml  Output   -300 ml  Net    720 ml   Filed Weights   11/15/14 2158 11/16/14 2131 11/17/14 0655  Weight: 70.5 kg (155 lb 6.8 oz) 70.7 kg (155 lb 13.8 oz) 69.1 kg (152 lb 5.4 oz)    Exam:   General: Alert in no distress.   Cardiovascular: S 1, S 2 RRR  Respiratory: CTA  Abdomen: BS present, soft, nt  Musculoskeletal: no edema.   Neuro exam: Alert, follows command, speech  soft,    Data Reviewed: Basic Metabolic Panel:  Recent Labs Lab 11/13/14 2110 11/15/14 0614  NA 137 137  K 3.7 3.7  CL 94* 97  CO2 34* 26  GLUCOSE 161* 99  BUN 14 29*  CREATININE 5.82* 8.60*  CALCIUM 9.2 9.0  PHOS  --  4.7*   Liver Function Tests:  Recent Labs Lab 11/13/14 2110 11/15/14 0614  AST 28  --   ALT 22  --   ALKPHOS 75  --   BILITOT 1.0  --   PROT 8.8*  --   ALBUMIN 3.0* 2.2*   No results for input(s): LIPASE, AMYLASE in the last 168 hours. No results for input(s): AMMONIA in the last 168 hours. CBC:  Recent Labs Lab 11/13/14 2110 11/15/14 0614 11/17/14 0851  WBC 14.6* 8.9 8.9  NEUTROABS 13.0*  --   --   HGB 10.4* 8.9* 9.1*  HCT 32.0* 27.3* 28.1*  MCV 84.4 83.5 83.4  PLT 367 354 370   Cardiac Enzymes:  Recent Labs Lab 11/13/14 2110 11/14/14 2035 11/14/14 2306 11/15/14 0614  TROPONINI 0.03 0.03 0.03 0.03   BNP (last 3 results) No results for input(s): BNP in the last 8760 hours.  ProBNP (last 3 results) No results for input(s): PROBNP in the last 8760 hours.  CBG:  Recent Labs Lab 11/13/14 2001 11/14/14 0628 11/17/14 0810  GLUCAP 127* 107* 128*  Recent Results (from the past 240 hour(s))  Blood culture (routine x 2)     Status: None (Preliminary result)   Collection Time: 11/13/14 10:56 PM  Result Value Ref Range Status   Specimen Description BLOOD LEFT ANTECUBITAL  Final   Special Requests BOTTLES DRAWN AEROBIC AND ANAEROBIC 10CC EA  Final   Culture   Final           BLOOD CULTURE RECEIVED NO GROWTH TO DATE CULTURE WILL BE HELD FOR 5 DAYS BEFORE ISSUING A FINAL NEGATIVE REPORT Note: Culture results may be compromised due to an excessive volume of blood received in culture bottles. Performed at [facility]    Report Status PENDING  Incomplete  Blood culture (routine x 2)     Status: None (Preliminary result)   Collection Time: 11/13/14 11:03 PM  Result Value Ref Range Status   Specimen Description BLOOD LEFT  HAND  Final   Special Requests BOTTLES DRAWN AEROBIC AND ANAEROBIC 5CC EA  Final   Culture   Final           BLOOD CULTURE RECEIVED NO GROWTH TO DATE CULTURE WILL BE HELD FOR 5 DAYS BEFORE ISSUING A FINAL NEGATIVE REPORT Performed at [facility]    Report Status PENDING  Incomplete  MRSA PCR Screening     Status: None   Collection Time: 11/14/14 12:33 AM  Result Value Ref Range Status   MRSA by PCR NEGATIVE NEGATIVE Final    Comment:        The GeneXpert MRSA Assay (FDA approved for NASAL specimens only), is one component of a comprehensive MRSA colonization surveillance program. It is not intended to diagnose MRSA infection nor to guide or monitor treatment for MRSA infections.      Studies: No results found.  Scheduled Meds: . aspirin EC  81 mg Oral Daily  . azelastine  1 spray Each Nare Daily  . calcium acetate  667 mg Oral BID AC  . ceFEPime (MAXIPIME) IV  2 g Intravenous Q T,Th,Sat-1800  . darbepoetin (ARANESP) injection - DIALYSIS  60 mcg Intravenous Q Sat-HD  . docusate sodium  100 mg Oral Daily  . gabapentin  300 mg Oral QHS  . heparin  5,000 Units Subcutaneous 3 times per day  . multivitamin  1 tablet Oral QHS  . pantoprazole  40 mg Oral Daily  . polyethylene glycol  17 g Oral Daily  . simvastatin  5 mg Oral QHS  . vancomycin  1,000 mg Intravenous Q T,Th,Sa-HD   Continuous Infusions:   Principal Problem:   HCAP (healthcare-associated pneumonia) Active Problems:   End stage renal disease on dialysis   Vasovagal syncope   Leukocytosis    Time spent: 35 minutes.     Niel Hummer A  [facility] Pager [phone]. If 7PM-7AM, please contact night-coverage at [email or web address], password [facility] 11/17/2014, 9:22 AM  LOS: 4 days

## 2014-11-17 NOTE — Progress Notes (Signed)
[facility] Progress Note    Assessment/ Plan:   1. HCAP- rx per admit team on Cefepime / Vanco. Afebrile.  2. ESRD - HD TTS - only received over an hour of dialysis when she became hypotensive and unresponsive. Will HD again on either Mon or Tues. May need to increase EDW; bed weights may not be accurate either. 3. Vasovagal Syncope- cardiology consulted. ECHO EF 45-50-%- potentially for outpt stress test. No further symptoms 4. Hypertension/volume - bp 128/48 / 1kg under edw. Hydralazine 50 mg BID 5. Anemia - admit hgb 10.4- now 8.9/ fe weekly hd, no esa outpt, will start low dose/ watch CBC 6. Metabolic bone disease - Po vit d on hd/ phoslo binder/ correcr ca 10.4 /phos 4.7 / dc calitriol 7. COPD- home meds per admit  8. DM type 2- diet controlled in past 9. Constipation- Miralax 10. Myoclonus type symptoms of upper extrem = may benefit low dose Klonopin BUT hold for now with SYNCOPE on ADmit/ And On Neurontin 300 mg hs/needs PT input  Subjective:   Hypotensive, bradycardic, n/v after 1 hour on dialysis. UF was only 2 liters but may have still been too much for her to handle. She received 612ml of saline before she came back. Currently A&Ox1 but appears to be comfortable.   Objective:   BP 133/64 mmHg  Pulse 90  Temp(Src) 97.5 F (36.4 C) (Oral)  Resp 16  Ht 5\' 2"  (1.575 m)  Wt 69.1 kg (152 lb 5.4 oz)  BMI 27.86 kg/m2  SpO2 88%  Intake/Output Summary (Last 24 hours) at 11/17/14 0819 Last data filed at 11/17/14 0600  Gross per 24 hour  Intake    540 ml  Output      1 ml  Net    539 ml   Weight change: -1.3 kg (-2 lb 13.9 oz)  Physical Exam: General: Alert and oriented, no acute distress.  Heart: RRR Lungs: dim bases, CTA, unlabored Abdomen: soft, nontender +BS Extremities: no edema  Dialysis Access: R AVG +b.t  Imaging: No results found.  Labs: BMET  Recent Labs Lab 11/13/14 2110 11/15/14 0614  NA 137 137  K 3.7 3.7  CL 94*  97  CO2 34* 26  GLUCOSE 161* 99  BUN 14 29*  CREATININE 5.82* 8.60*  CALCIUM 9.2 9.0  PHOS  --  4.7*   CBC  Recent Labs Lab 11/13/14 2110 11/15/14 0614  WBC 14.6* 8.9  NEUTROABS 13.0*  --   HGB 10.4* 8.9*  HCT 32.0* 27.3*  MCV 84.4 83.5  PLT 367 354    Medications:    . aspirin EC  81 mg Oral Daily  . azelastine  1 spray Each Nare Daily  . calcium acetate  667 mg Oral BID AC  . ceFEPime (MAXIPIME) IV  2 g Intravenous Q T,Th,Sat-1800  . darbepoetin (ARANESP) injection - DIALYSIS  60 mcg Intravenous Q Sat-HD  . docusate sodium  100 mg Oral Daily  . gabapentin  300 mg Oral QHS  . heparin  5,000 Units Subcutaneous 3 times per day  . heparin  5,000 Units Dialysis Once in dialysis  . hydrALAZINE  50 mg Oral BID  . multivitamin  1 tablet Oral QHS  . pantoprazole  40 mg Oral Daily  . polyethylene glycol  17 g Oral Daily  . simvastatin  5 mg Oral QHS  . vancomycin  1,000 mg Intravenous Q T,Th,Sa-HD      Otelia Santee, MD 11/17/2014, 8:19 AM

## 2014-11-17 NOTE — Progress Notes (Signed)
Hemodialysis- At approximatly 8am pt had hypotensive episode bp 57/32 lowest recorded, HR 31, pt unresponsive but +pulses. Immediately placed in trendelenburg and given 600cc bolus saline before responsive again. BP recheck at 0803 97/57 HR 112. Pt began to vomit, hob increased and suction given. Pt confused, calling out, diaphoretic. CBG 129. Rapid response called and Dr. Augustin Coupe at bedside. Pt will need stepdown bed per MD. Vitals now stable bp 153/65, HR 95, RR 15 and patient follows simple commands. Continues to call out and remains confused to place time and situation. Rapid to bedside assisting with transfer request.

## 2014-11-17 NOTE — Consult Note (Signed)
Consult  Reason for Consult:altered mental status Referring Physician: Dr Tyrell Antonio  CC: altered mental status  HPI: Annette Roach is an 81 y.o. female with history of DM, HA, ESRD on HD, HTN admitted with rectal pain and syncope. Developed severe constipation, while having a BM had a syncopal episode. Diagnosed with HCAP during hospital stay.  This morning during HD developed bradycardia with HR 31 bpm with pauses the longest 1.8 seconds. Blood pressure down to 57/32. She was given NS bolus with improvement in HR and BP. Noted to be confused and disoriented afterwards. Neurology consulted for AMS.  CT head imaging reviewed, overall unremarkable. Patient afebrile and basic lab work overall unremarkable. Of note, patients daughter is at bedside and she reports patient has had similar episodes in the past. These typically occur when she is stressed or in pain.   Past Medical History  Diagnosis Date  . COPD (chronic obstructive pulmonary disease)     emphysema  . Neuropathy   . Hyperlipidemia   . Diabetes mellitus   . Depression   . Anemia   . Shortness of breath   . GERD (gastroesophageal reflux disease)   . Hyperparathyroidism   . Hypertension     sees Dr. Gilford Rile  . Headache(784.0)   . Arthritis   . CAD (coronary artery disease)     sees Dr. Jenne Campus, [facility] cardiology [facility], [place]  . 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 Wears glasses   . Wears hearing aid     right  . Presence of surgically created AV shunt for hemodialysis     old lt upper arm out-rt upper arm shunt in  . Anginal pain     Dr Raliegh Ip in [place], [place]  . History of kidney stones   . Constipation   . History of blood transfusion   . Asthma     years ago  . Chronic kidney disease     TTH [facility] [place], [place]- Hemo    Past Surgical History  Procedure Laterality Date  . Abdominal hysterectomy  1976  . Kidney stone surgery  2013  . Av fistula placement  01/20/2012   Procedure: ARTERIOVENOUS (AV) FISTULA CREATION;  Surgeon: Elam Dutch, MD;  Location: [facility];  Service: Vascular;  Laterality: Left;  . Hemodialysis catheter  05/25/12    secondary to failed AVF   . Eye surgery      bilateral cataract removed  . Av fistula placement  09/05/2012    Procedure: INSERTION OF ARTERIOVENOUS (AV) GORE-TEX GRAFT ARM;  Surgeon: Elam Dutch, MD;  Location: [facility] OR;  Service: Vascular;  Laterality: Left;  Using 4-16mm x 45 cm Vascular stretch goretex graft  . Ligation of arteriovenous  fistula  09/05/2012    Procedure: LIGATION OF ARTERIOVENOUS  FISTULA;  Surgeon: Elam Dutch, MD;  Location: [facility];  Service: Vascular;  Laterality: Left;  . Ligation arteriovenous gortex graft  09/05/2012    Procedure: LIGATION ARTERIOVENOUS GORTEX GRAFT;  Surgeon: Elam Dutch, MD;  Location: [facility];  Service: Vascular;  Laterality: Left;  . Av fistula placement Right 11/07/2012    Procedure: ARTERIOVENOUS (AV) FISTULA CREATION;  Surgeon: Elam Dutch, MD;  Location[facility] OR;  Service: Vascular;  Laterality: Right;  Bascilic Vein Fistula  . Appendectomy    . Carpal tunnel release Left 02/10/2013    Procedure: CARPAL TUNNEL RELEASE;  Surgeon: Cammie Sickle., MD;  Location: [facility];  Service: Orthopedics;  Laterality: Left;  . Bascilic vein transposition Right 03/15/2013    Procedure: 2ND STAGE BASCILIC VEIN TRANSPOSITION - RIGHT ARM;  Surgeon: Elam Dutch, MD;  Location: [facility];  Service: Vascular;  Laterality: Right;  . Av fistula placement Right 05/15/2013    Procedure: INSERTION OF ARTERIOVENOUS (AV) GORE-TEX GRAFT ARM-RIGHT;  Surgeon: Elam Dutch, MD;  Location: [facility];  Service: Vascular;  Laterality: Right;  . Shuntogram N/A 07/27/2012    Procedure: [place] Mal[place];  Surgeon: Rosetta Posner, MD;  Location: [facility] CATH LAB;  Service: Cardiovascular;  Laterality: N/A;    Family History  Problem Relation Age of Onset  . Diabetes Mother   . Cancer Father   .  Deep vein thrombosis Brother   . Hypertension Brother     Social History:  reports that she has never smoked. She has never used smokeless tobacco. She reports that she does not drink alcohol or use illicit drugs.  No Known Allergies  Medications:  Scheduled: . aspirin EC  81 mg Oral Daily  . azelastine  1 spray Each Nare Daily  . calcium acetate  667 mg Oral BID AC  . darbepoetin (ARANESP) injection - DIALYSIS  60 mcg Intravenous Q Sat-HD  . docusate sodium  100 mg Oral Daily  . gabapentin  300 mg Oral QHS  . heparin  5,000 Units Subcutaneous 3 times per day  . multivitamin  1 tablet Oral QHS  . pantoprazole  40 mg Oral Daily  . piperacillin-tazobactam (ZOSYN)  IV  2.25 g Intravenous 3 times per day  . polyethylene glycol  17 g Oral Daily  . simvastatin  5 mg Oral QHS  . vancomycin  1,000 mg Intravenous Q T,Th,Sa-HD    ROS: Out of a complete 14 system review, the patient complains of only the following symptoms, and all other reviewed systems are negative. Non-verbal, unable to obtain  Physical Examination: Filed Vitals:   11/17/14 1131  BP:   Pulse:   Temp: 97.8 F (36.6 C)  Resp:    Physical Exam  Constitutional: He appears well-developed and well-nourished.  Psych: Affect appropriate to situation Eyes: No scleral injection HENT: No OP obstrucion Head: Normocephalic.  Cardiovascular: Normal rate and regular rhythm.  Respiratory: Effort normal and breath sounds normal.  GI: Soft. Bowel sounds are normal. No distension. There is no tenderness.  Skin: WDI  Neurologic Examination Mental Status: Alert, makes eye contact, non-verbal, in bed moaning/whimpering. Not following commands Cranial Nerves: II: optic discs not visualized, visual fields grossly normal, pupils equal, round, reactive to light  III,IV, VI: ptosis not present, extra-ocular motions intact bilaterally V,VII: smile symmetric, facial light touch sensation normal bilaterally VIII: hearing  normal bilaterally IX,X: gag reflex present XI: trapezius strength/neck flexion strength normal bilaterally XII: tongue strength normal  Motor: Not following commands so unable to formally test. Appears to move all extremities symmetrically though Tone and bulk:normal tone throughout; no atrophy noted Sensory: withdrawals to noxious stimuli in all extremities symmetrically Deep Tendon Reflexes: 2+ and symmetric throughout Plantars: Right: downgoing   Left: downgoing Cerebellar: Unable to test Gait: unable to test  Laboratory Studies:   Basic Metabolic Panel:  Recent Labs Lab 11/13/14 2110 11/15/14 0614 11/17/14 0851  NA 137 137 135  K 3.7 3.7 4.7  CL 94* 97 98  CO2 34* 26 26  GLUCOSE 161* 99 111*  BUN 14 29* 25*  CREATININE 5.82* 8.60* 7.74*  CALCIUM 9.2 9.0 9.2  PHOS  --  4.7*  --     Liver Function Tests:  Recent Labs Lab 11/13/14 2110 11/15/14 0614  AST 28  --   ALT 22  --   ALKPHOS 75  --   BILITOT 1.0  --   PROT 8.8*  --   ALBUMIN 3.0* 2.2*   No results for input(s): LIPASE, AMYLASE in the last 168 hours. No results for input(s): AMMONIA in the last 168 hours.  CBC:  Recent Labs Lab 11/13/14 2110 11/15/14 0614 11/17/14 0851  WBC 14.6* 8.9 8.9  NEUTROABS 13.0*  --   --   HGB 10.4* 8.9* 9.1*  HCT 32.0* 27.3* 28.1*  MCV 84.4 83.5 83.4  PLT 367 354 370    Cardiac Enzymes:  Recent Labs Lab 11/13/14 2110 11/14/14 2035 11/14/14 2306 11/15/14 0614 11/17/14 0851  TROPONINI 0.03 0.03 0.03 0.03 0.04*    BNP: Invalid input(s): POCBNP  CBG:  Recent Labs Lab 11/13/14 2001 11/14/14 0628 11/17/14 0810  GLUCAP 127* 107* 128*    Microbiology: Results for orders placed or performed during the hospital encounter of 11/13/14  Blood culture (routine x 2)     Status: None (Preliminary result)   Collection Time: 11/13/14 10:56 PM  Result Value Ref Range Status   Specimen Description BLOOD LEFT ANTECUBITAL  Final   Special Requests BOTTLES  DRAWN AEROBIC AND ANAEROBIC 10CC EA  Final   Culture   Final           BLOOD CULTURE RECEIVED NO GROWTH TO DATE CULTURE WILL BE HELD FOR 5 DAYS BEFORE ISSUING A FINAL NEGATIVE REPORT Note: Culture results may be compromised due to an excessive volume of blood received in culture bottles. Performed at [facility]    Report Status PENDING  Incomplete  Blood culture (routine x 2)     Status: None (Preliminary result)   Collection Time: 11/13/14 11:03 PM  Result Value Ref Range Status   Specimen Description BLOOD LEFT HAND  Final   Special Requests BOTTLES DRAWN AEROBIC AND ANAEROBIC 5CC EA  Final   Culture   Final           BLOOD CULTURE RECEIVED NO GROWTH TO DATE CULTURE WILL BE HELD FOR 5 DAYS BEFORE ISSUING A FINAL NEGATIVE REPORT Performed at [facility]    Report Status PENDING  Incomplete  MRSA PCR Screening     Status: None   Collection Time: 11/14/14 12:33 AM  Result Value Ref Range Status   MRSA by PCR NEGATIVE NEGATIVE Final    Comment:        The GeneXpert MRSA Assay (FDA approved for NASAL specimens only), is one component of a comprehensive MRSA colonization surveillance program. It is not intended to diagnose MRSA infection nor to guide or monitor treatment for MRSA infections.     Coagulation Studies: No results for input(s): LABPROT, INR in the last 72 hours.  Urinalysis: No results for input(s): COLORURINE, LABSPEC, PHURINE, GLUCOSEU, HGBUR, BILIRUBINUR, KETONESUR, PROTEINUR, UROBILINOGEN, NITRITE, LEUKOCYTESUR in the last 168 hours.  Invalid input(s): APPERANCEUR  Lipid Panel:  No results found for: CHOL, TRIG, HDL, CHOLHDL, VLDL, LDLCALC  HgbA1C:  Lab Results  Component Value Date   HGBA1C 5.0 10/28/2014    Urine Drug Screen:  No results found for: LABOPIA, COCAINSCRNUR, LABBENZ, AMPHETMU, THCU, LABBARB  Alcohol Level: No results for input(s): ETH in the last 168 hours.  Other results:  Imaging: Ct Head Wo Contrast  11/17/2014    CLINICAL DATA:  ENCEPHALOPATHY, CONFUSION,  PT. MOANING DURING SCAN,  EXAM: CT HEAD WITHOUT CONTRAST  TECHNIQUE: Contiguous axial images were obtained from the base of the skull through the vertex without intravenous contrast.  COMPARISON:  10/26/2014  FINDINGS: Ventricles are normal in size, for this patient's age, and normal in configuration.  There are no parenchymal masses or mass effect. Patchy Ducre matter hypoattenuation is noted consistent with moderate chronic microvascular ischemic change.  No evidence of a cortical infarct.  There are no extra-axial masses or abnormal fluid collections.  There is no intracranial hemorrhage  Right maxillary sinus is mostly opacified by a combination of dependent fluid and mucosal thickening. There is moderate left maxillary sinus mucosal thickening. Significant mucosal thickening is noted along the middle and anterior right ethmoid air cells extending to the right frontal sinus which is occluded. Minor dependent secretions noted in the sphenoid sinuses. Clear left ethmoid air cells and left frontal sinus.  Focal soft tissue opacity in the right nasal cavity near the ostiomeatal complex on the right suggests a polyp.  Small frontal calvarial lucent lesion noted on prior study is stable.  IMPRESSION: 1. No acute intracranial abnormalities. 2. Age related volume loss. Moderate chronic microvascular ischemic change. 3. Significant sinus disease. Probable right nasal spine is cavity polyp. 4. No significant change from the prior head CT.   Electronically Signed   By: Lajean Manes M.D.   On: 11/17/2014 10:38   Dg Chest Port 1 View  11/17/2014   CLINICAL DATA:  Vomiting.  EXAM: PORTABLE CHEST - 1 VIEW  COMPARISON:  11/13/2014  FINDINGS: Hazy airspace opacity projects in the left mid to lower lung zone obscuring the left hemidiaphragm. This is without significant change. Remainder of the lungs is clear. No evidence of pulmonary edema. No pneumothorax.  Enlargement of the  cardiopericardial silhouette is stable. No mediastinal or hilar masses.  Bony thorax demineralized but grossly intact.  IMPRESSION: 1. Cardiomegaly, unchanged from the recent study. 2. Persistent left lower lung zone airspace opacity likely combination of infiltrate or atelectasis and a small effusion. This is also stable. No new abnormalities.   Electronically Signed   By: Lajean Manes M.D.   On: 11/17/2014 09:50     Assessment/Plan:  81y/o woman with history of DM, HA, ESRD on HD, HTN admitted with rectal pain and syncope. During HD this morning had profound hypotension (57/32) and bradycardia down to 31. Became unresponsive during this event. Since then has had decreased verbal output. Of note her daughter states she has had similar episodes in the past where she becomes non-verbal and these are typically related to anxiety or pain. Suspect this is the cause of her current presentation.   -cardiac workup for bradycardia -check ammonia, TSH -if no improvement in mental status can consider EEG -as exam is non-focal would hold on MRI at this time   Jim Like, DO [facility] [phone]  If 7pm- 7am, please page neurology on call as listed in [place]. 11/17/2014, 12:49 PM

## 2014-11-17 NOTE — Evaluation (Signed)
Clinical/Bedside Swallow Evaluation Patient Details  Name: Annette Roach MRN: [id number] Date of Birth: 02-Sep-1927  Today's Date: 11/17/2014 Time: SLP Start Time (ACUTE ONLY): 0034 SLP Stop Time (ACUTE ONLY): 1447 SLP Time Calculation (min) (ACUTE ONLY): 19 min  Past Medical History:  Past Medical History  Diagnosis Date  . COPD (chronic obstructive pulmonary disease)     emphysema  . Neuropathy   . Hyperlipidemia   . Diabetes mellitus   . Depression   . Anemia   . Shortness of breath   . GERD (gastroesophageal reflux disease)   . Hyperparathyroidism   . Hypertension     sees Dr. Gilford Rile  . Headache(784.0)   . Arthritis   . CAD (coronary artery disease)     sees Dr. Jenne Campus, [facility], [place]  . 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 Wears glasses   . Wears hearing aid     right  . Presence of surgically created AV shunt for hemodialysis     old lt upper arm out-rt upper arm shunt in  . Anginal pain     Dr Raliegh Ip in [place], [place]  . History of kidney stones   . Constipation   . History of blood transfusion   . Asthma     years ago  . Chronic kidney disease     TTH SAT , [place]- Hemo   Past Surgical History:  Past Surgical History  Procedure Laterality Date  . Abdominal hysterectomy  1976  . Kidney stone surgery  2013  . Av fistula placement  01/20/2012    Procedure: ARTERIOVENOUS (AV) FISTULA CREATION;  Surgeon: Elam Dutch, MD;  Location: [place];  Service: Vascular;  Laterality: Left;  . Hemodialysis catheter  05/25/12    secondary to failed AVF   . Eye surgery      bilateral cataract removed  . Av fistula placement  09/05/2012    Procedure: INSERTION OF ARTERIOVENOUS (AV) GORE-TEX GRAFT ARM;  Surgeon: Elam Dutch, MD;  Location: [facility] OR;  Service: Vascular;  Laterality: Left;  Using 4-24mm x 45 cm Vascular stretch goretex graft  . Ligation of arteriovenous  fistula  09/05/2012    Procedure: LIGATION OF  ARTERIOVENOUS  FISTULA;  Surgeon: Elam Dutch, MD;  Location: [place];  Service: Vascular;  Laterality: Left;  . Ligation arteriovenous gortex graft  09/05/2012    Procedure: LIGATION ARTERIOVENOUS GORTEX GRAFT;  Surgeon: Elam Dutch, MD;  Location: [place];  Service: Vascular;  Laterality: Left;  . Av fistula placement Right 11/07/2012    Procedure: ARTERIOVENOUS (AV) FISTULA CREATION;  Surgeon: Elam Dutch, MD;  Location: [facility] OR;  Service: Vascular;  Laterality: Right;  Bascilic Vein Fistula  . Appendectomy    . Carpal tunnel release Left 02/10/2013    Procedure: CARPAL TUNNEL RELEASE;  Surgeon: Cammie Sickle., MD;  Location: [place];  Service: Orthopedics;  Laterality: Left;  . Bascilic vein transposition Right 03/15/2013    Procedure: 2ND STAGE BASCILIC VEIN TRANSPOSITION - RIGHT ARM;  Surgeon: Elam Dutch, MD;  Location: [place];  Service: Vascular;  Laterality: Right;  . Av fistula placement Right 05/15/2013    Procedure: INSERTION OF ARTERIOVENOUS (AV) GORE-TEX GRAFT ARM-RIGHT;  Surgeon: Elam Dutch, MD;  Location: [place];  Service: Vascular;  Laterality: Right;  . Shuntogram N/A 07/27/2012    Procedure: Earney Mallet;  Surgeon: Rosetta Posner, MD;  Location: [facility]  CATH LAB;  Service: Cardiovascular;  Laterality: N/A;   HPI:  Annette Roach is a 81 y.o. female with past medical history of congestive heart failure, hypertension, GERD, hyperlipidemia, asthma, COPD, diabetes mellitus, end-stage renal disease on dialysis (Tuesday/Thursday/Saturday), atrial fibrillation (only on aspirin), who presents with the abdominal pain and chest pain. Patient is transferred from [facility] per Dr. Wilder Glade. Patient presented to the [facility], with initial presentation of chest pain and altered mental status. Per Dr. Wilder Glade, she has intermittent mild chest pain in the past month. She seems to have altered mental status and confusion recently. She was found to have fever of  102.2, and shortness of breath. They did ABG which showed pH 7.49, PCO2 47, PO2 65%. Patient did not have cough. Patient was suspected to have severe sepsis in [facility]. Patient also complains of abdominal pain which also resolved currently.  Of note, she reports that she was diagnosed as GIST (GI stromal tumor?) at about 3 weeks ago. No surgery was done yet (no document available now).  Currently, patient denies fever, chills, headaches, cough, chest pain, SOB, abdominal pain, diarrhea, constipation, dysuria, urgency, frequency, hematuria, skin rashes, joint pain or leg swelling. No unilateral weakness, numbness or tingling sensations. No vision change or hearing loss.In [facility], CT-head was negative for acute abnormalities. Chest x-ray was negative for infiltration, but showed mild pulmonary edema. CT angiogram was negative for infiltration and pulmonary embolism.    Assessment / Plan / Recommendation Clinical Impression  Limited clinical evaluation of swallowing completed.  The patient was lethargic throughout evaluation and unable to follow any commands.  According to the nurse, the patient's daughter reports that the patient's mental status is at baseline.  When presented with an ice chip and spoon presentation of nectar liquids the patient required oral suctioning of the material as she made no attempt to transport the bolus.  Given the patient's current mental status and reports that this is baseline consider Palliative Care consult to discuss goals of care.  ST will follow up tomorrow or next date for re evaluation of swallowing.  Recommend keep the patient NPO.      Aspiration Risk  Severe    Diet Recommendation NPO   Medication Administration: Via alternative means    Other  Recommendations Oral Care Recommendations: Oral care Q4 per protocol    Swallow Study Prior Functional Status       General Date of Onset: 11/13/14 HPI: Annette Roach is a 81 y.o. female with  past medical history of congestive heart failure, hypertension, GERD, hyperlipidemia, asthma, COPD, diabetes mellitus, end-stage renal disease on dialysis (Tuesday/Thursday/Saturday), atrial fibrillation (only on aspirin), who presents with the abdominal pain and chest pain. Patient is transferred from [facility] per Dr. Wilder Glade. Patient presented to the [facility], with initial presentation of chest pain and altered mental status. Per Dr. Wilder Glade, she has intermittent mild chest pain in the past month. She seems to have altered mental status and confusion recently. She was found to have fever of 102.2, and shortness of breath. They did ABG which showed pH 7.49, PCO2 47, PO2 65%. Patient did not have cough. Patient was suspected to have severe sepsis in [facility]. Patient also complains of abdominal pain which also resolved currently.  Of note, she reports that she was diagnosed as GIST (GI stromal tumor?) at about 3 weeks ago. No surgery was done yet (no document available now).  Currently, patient denies fever, chills, headaches, cough, chest pain,  SOB, abdominal pain, diarrhea, constipation, dysuria, urgency, frequency, hematuria, skin rashes, joint pain or leg swelling. No unilateral weakness, numbness or tingling sensations. No vision change or hearing loss.In [facility], CT-head was negative for acute abnormalities. Chest x-ray was negative for infiltration, but showed mild pulmonary edema. CT angiogram was negative for infiltration and pulmonary embolism.  Type of Study: Bedside swallow evaluation Previous Swallow Assessment: none noted Diet Prior to this Study: Information not available Temperature Spikes Noted: No Respiratory Status: Room air History of Recent Intubation: No Behavior/Cognition: Confused;Lethargic;Doesn't follow directions;Decreased sustained attention Oral Cavity - Dentition: Edentulous Self-Feeding Abilities: Total assist Patient Positioning: Upright  in bed Baseline Vocal Quality:  (unable to assess) Volitional Cough: Cognitively unable to elicit Volitional Swallow: Unable to elicit    Oral/Motor/Sensory Function     Ice Chips Ice chips: Impaired Presentation: Spoon Oral Phase Impairments: Poor awareness of bolus;Impaired anterior to posterior transit Oral Phase Functional Implications: Oral holding (pt orally suctioned to removed material) Pharyngeal Phase Impairments:  (unable to assess as pt never triggered pharyngeal swallow.)   Thin Liquid Thin Liquid: Not tested    Nectar Thick Nectar Thick Liquid: Impaired Presentation: Spoon Oral Phase Impairments: Impaired anterior to posterior transit;Poor awareness of bolus Oral phase functional implications: Oral holding (pt orally suctioned to removed material) Pharyngeal Phase Impairments:  (unable to assess as pt never triggered pharyngeal swallow.)   Honey Thick Honey Thick Liquid: Not tested   Puree Puree: Not tested   Solid   GO    Solid: Not tested       Shelly Flatten N 11/17/2014,2:57 PM  Shelly Flatten, [place] [place] [facility] SLP [phone]

## 2014-11-17 NOTE — Progress Notes (Signed)
Pt received from dialysis. Report from dialysis staff: pt blood pressure low while doing HD pt unresponsive. Staff unable to complete dialysis treatment. Pt in room 1, saline lock to left AC area, fistula to right arm, Pt alert not oriented. Small amount of stool on patient. Glasses with patient.

## 2014-11-17 NOTE — Progress Notes (Signed)
ANTIBIOTIC CONSULT NOTE - FOLLOW UP  Pharmacy Consult for Vancomycin and Zosyn Indication: r/o PNA and possible aspiration PNA  No Known Allergies  Patient Measurements: Height: 5\' 2"  (157.5 cm) Weight: 152 lb 5.4 oz (69.1 kg) IBW/kg (Calculated) : 50.1  Vital Signs: Temp: 97.5 F (36.4 C) (03/19 0655) Temp Source: Oral (03/19 0655) BP: 122/68 mmHg (03/19 0900) Pulse Rate: 95 (03/19 0900) Intake/Output from previous day: 03/18 0701 - 03/19 0700 In: 540 [P.O.:540] Out: 1 [Urine:1] Intake/Output from this shift: Total I/O In: -  Out: -300   Labs:  Recent Labs  11/15/14 0614 11/17/14 0851  WBC 8.9 8.9  HGB 8.9* 9.1*  PLT 354 370  CREATININE 8.60* 7.74*   Estimated Creatinine Clearance: 4.8 mL/min (by C-G formula based on Cr of 7.74). No results for input(s): VANCOTROUGH, VANCOPEAK, VANCORANDOM, GENTTROUGH, GENTPEAK, GENTRANDOM, TOBRATROUGH, TOBRAPEAK, TOBRARND, AMIKACINPEAK, AMIKACINTROU, AMIKACIN in the last 72 hours.   Microbiology: Recent Results (from the past 720 hour(s))  Culture, blood (routine x 2)     Status: None   Collection Time: 10/27/14 11:15 AM  Result Value Ref Range Status   Specimen Description BLOOD LEFT ARM  Final   Special Requests   Final    BOTTLES DRAWN AEROBIC AND ANAEROBIC 10CC BLUE 5CC RED   Culture   Final    NO GROWTH 5 DAYS Performed at [facility]    Report Status 11/02/2014 FINAL  Final  Culture, blood (routine x 2)     Status: None   Collection Time: 10/27/14 11:23 AM  Result Value Ref Range Status   Specimen Description BLOOD LEFT HAND  Final   Special Requests BOTTLES DRAWN AEROBIC ONLY 10CC  Final   Culture   Final    NO GROWTH 5 DAYS Note: Culture results may be compromised due to an excessive volume of blood received in culture bottles. Performed at [facility]    Report Status 11/02/2014 FINAL  Final  Blood culture (routine x 2)     Status: None (Preliminary result)   Collection Time: 11/13/14  10:56 PM  Result Value Ref Range Status   Specimen Description BLOOD LEFT ANTECUBITAL  Final   Special Requests BOTTLES DRAWN AEROBIC AND ANAEROBIC 10CC EA  Final   Culture   Final           BLOOD CULTURE RECEIVED NO GROWTH TO DATE CULTURE WILL BE HELD FOR 5 DAYS BEFORE ISSUING A FINAL NEGATIVE REPORT Note: Culture results may be compromised due to an excessive volume of blood received in culture bottles. Performed at [facility]    Report Status PENDING  Incomplete  Blood culture (routine x 2)     Status: None (Preliminary result)   Collection Time: 11/13/14 11:03 PM  Result Value Ref Range Status   Specimen Description BLOOD LEFT HAND  Final   Special Requests BOTTLES DRAWN AEROBIC AND ANAEROBIC 5CC EA  Final   Culture   Final           BLOOD CULTURE RECEIVED NO GROWTH TO DATE CULTURE WILL BE HELD FOR 5 DAYS BEFORE ISSUING A FINAL NEGATIVE REPORT Performed at [facility]    Report Status PENDING  Incomplete  MRSA PCR Screening     Status: None   Collection Time: 11/14/14 12:33 AM  Result Value Ref Range Status   MRSA by PCR NEGATIVE NEGATIVE Final    Comment:        The GeneXpert MRSA Assay (FDA approved for NASAL  specimens only), is one component of a comprehensive MRSA colonization surveillance program. It is not intended to diagnose MRSA infection nor to guide or monitor treatment for MRSA infections.     Anti-infectives    Start     Dose/Rate Route Frequency Ordered Stop   11/16/14 0000  levofloxacin (LEVAQUIN) 250 MG tablet     250 mg Oral Daily 11/16/14 1136     11/15/14 1800  ceFEPIme (MAXIPIME) 2 g in dextrose 5 % 50 mL IVPB  Status:  Discontinued     2 g 100 mL/hr over 30 Minutes Intravenous Every T-Th-Sa (1800) 11/13/14 2323 11/17/14 0934   11/15/14 1200  vancomycin (VANCOCIN) IVPB 1000 mg/200 mL premix     1,000 mg 200 mL/hr over 60 Minutes Intravenous Every T-Th-Sa (Hemodialysis) 11/13/14 2323     11/13/14 2330  ceFEPIme (MAXIPIME) 2 g in  dextrose 5 % 50 mL IVPB     2 g 100 mL/hr over 30 Minutes Intravenous  Once 11/13/14 2323 11/14/14 0410   11/13/14 2315  ceFEPIme (MAXIPIME) 1 g in dextrose 5 % 50 mL IVPB  Status:  Discontinued     1 g 100 mL/hr over 30 Minutes Intravenous 3 times per day 11/13/14 2309 11/13/14 2323   11/13/14 2245  vancomycin (VANCOCIN) 1,500 mg in sodium chloride 0.9 % 500 mL IVPB     1,500 mg 250 mL/hr over 120 Minutes Intravenous  Once 11/13/14 2233 11/14/14 0109   11/13/14 2245  piperacillin-tazobactam (ZOSYN) IVPB 3.375 g     3.375 g 100 mL/hr over 30 Minutes Intravenous  Once 11/13/14 2233 11/13/14 2340      Assessment: 81 y/o F here with syncope, possible infiltrate on CXR, pt is ESRD on HD TTS, WBC mildly elevated. Pharmacy to dose antibiotics for PNA. MD worried about aspiration PNA due to vomiting episodes so will add zosyn. WBC wnl, currently afebrile.  Goal of Therapy:  Pre-HD Vancomycin level of 15-25 mcg/mL Eradication of infection  Plan:  Continue Vancomycin 1000 mg IV qHD Tues/Thurs/Sat Stop Cefepime Start Zosyn 2.25g IV Q8 Monitor HD, CBC, VT at Css F/U abx LOT (Aim for 8 days of abx)  Raechel Marcos J 11/17/2014,9:44 AM

## 2014-11-17 NOTE — Progress Notes (Signed)
Patient Name: Annette Roach Date of Encounter: 11/17/2014  Principal Problem:   HCAP (healthcare-associated pneumonia) Active Problems:   End stage renal disease on dialysis   Vasovagal syncope   Leukocytosis   Length of Stay: 4  SUBJECTIVE  The patient denies chest pain or dizziness, however seems to be confused.  CURRENT MEDS . aspirin EC  81 mg Oral Daily  . azelastine  1 spray Each Nare Daily  . calcium acetate  667 mg Oral BID AC  . darbepoetin (ARANESP) injection - DIALYSIS  60 mcg Intravenous Q Sat-HD  . docusate sodium  100 mg Oral Daily  . gabapentin  300 mg Oral QHS  . heparin  5,000 Units Subcutaneous 3 times per day  . multivitamin  1 tablet Oral QHS  . pantoprazole  40 mg Oral Daily  . piperacillin-tazobactam (ZOSYN)  IV  2.25 g Intravenous 3 times per day  . polyethylene glycol  17 g Oral Daily  . simvastatin  5 mg Oral QHS  . vancomycin  1,000 mg Intravenous Q T,Th,Sa-HD    OBJECTIVE  Filed Vitals:   11/17/14 0815 11/17/14 0900 11/17/14 0930 11/17/14 1131  BP: 153/65 122/68 146/51   Pulse: 100 95 100   Temp:    97.8 F (36.6 C)  TempSrc:    Oral  Resp: 15  16   Height:      Weight:      SpO2: 98% 99% 100%     Intake/Output Summary (Last 24 hours) at 11/17/14 1203 Last data filed at 11/17/14 0805  Gross per 24 hour  Intake    420 ml  Output   -300 ml  Net    720 ml   Filed Weights   11/15/14 2158 11/16/14 2131 11/17/14 0655  Weight: 155 lb 6.8 oz (70.5 kg) 155 lb 13.8 oz (70.7 kg) 152 lb 5.4 oz (69.1 kg)    PHYSICAL EXAM  General: Somnolent Neuro: AAO x 1 Psych: slow affect HEENT:  Normal  Neck: Supple without bruits or JVD. Lungs:  Resp regular and unlabored, CTA. Heart: RRR no s3, s4, or murmurs. Abdomen: Soft, non-tender, non-distended, BS + x 4.  Extremities: No clubbing, cyanosis or edema. DP/PT/Radials 2+ and equal bilaterally.  Accessory Clinical Findings  CBC  Recent Labs  11/15/14 0614 11/17/14 0851  WBC 8.9  8.9  HGB 8.9* 9.1*  HCT 27.3* 28.1*  MCV 83.5 83.4  PLT 354 094   Basic Metabolic Panel  Recent Labs  11/15/14 0614 11/17/14 0851  NA 137 135  K 3.7 4.7  CL 97 98  CO2 26 26  GLUCOSE 99 111*  BUN 29* 25*  CREATININE 8.60* 7.74*  CALCIUM 9.0 9.2  PHOS 4.7*  --    Liver Function Tests  Recent Labs  11/15/14 0614  ALBUMIN 2.2*  Cardiac Enzymes  Recent Labs  11/14/14 2306 11/15/14 0614 11/17/14 0851  TROPONINI 0.03 0.03 0.04*   Radiology/Studies  Ct Head Wo Contrast  11/17/2014   CLINICAL DATA:  ENCEPHALOPATHY, CONFUSION, PT. MOANING DURING SCAN.  IMPRESSION: 1. No acute intracranial abnormalities. 2. Age related volume loss. Moderate chronic microvascular ischemic change. 3. Significant sinus disease. Probable right nasal spine is cavity polyp. 4. No significant change from the prior head CT.     Dg Chest Port 1 View  11/17/2014   CLINICAL DATA:  Vomiting.  EXAM: PORTABLE CHEST - 1 VIEW  COMPARISON:  11/13/2014  FINDINGS: Hazy airspace opacity projects in the left mid to  lower lung zone obscuring the left hemidiaphragm. This is without significant change. Remainder of the lungs is clear. No evidence of pulmonary edema. No pneumothorax.  Enlargement of the cardiopericardial silhouette is stable. No mediastinal or hilar masses.  Bony thorax demineralized but grossly intact.  IMPRESSION: 1. Cardiomegaly, unchanged from the recent study. 2. Persistent left lower lung zone airspace opacity likely combination of infiltrate or atelectasis and a small effusion. This is also stable. No new abnormalities.    TELE: SR, profound sinus bradycardia with 1.AVB this am at 8:19 am with HR 39 BPM and pauses, the longest 1.8 seconds    ASSESSMENT AND PLAN  1. Syncope 2. PNA: on abx 3. HTN 4. HLD 5. DM 6. COPD 7. CAD: no prior record, unknown history. Patient denies any prior cardiac workup, however she is a poor historian 8. ESRD on HD T/Th/Sat  Pleasant 81 year old  African-American female with past medical history of HTN, HLD, DM, COPD, history of angina/CAD, ESRD on HD T/Th/Sat present with LOC while straining for BM and diagnosed with PNA as well. Cardiology consulted for syncope and abnormal echo result. The syncope was considered to be typical vasovagal. She had no chest pain and no SOB. Ecg shows no change in RBBB, LAFB, and marked LVH. By Echo she does have mild LV dysfunction with inferior wall motion abnormality. This is suggestive of underlying CAD and this would not be surprising at her age with ESRD, HTN, DM, and HL.   This mprning during HD she developed asymptomatic bradycardia with 1.AVB this am at 8:19 am with HR 39 BPM and pauses, the longest 1.8 seconds. The patient is currently confused, but denies any symptoms at that time.   With ESRD on HD and ongoing infection - pneumonia she is a poor candidate for permanent pacemaker placement. We will continue to monitor.   Signed, Dorothy Spark MD, [facility] 11/17/2014

## 2014-11-17 NOTE — Progress Notes (Signed)
Hemodialysis- Report called to Annette Roach, Vitals stable. Pt transferred.

## 2014-11-18 ENCOUNTER — Inpatient Hospital Stay (HOSPITAL_COMMUNITY): Payer: Medicare Other

## 2014-11-18 DIAGNOSIS — R4182 Altered mental status, unspecified: Secondary | ICD-10-CM

## 2014-11-18 DIAGNOSIS — G9340 Encephalopathy, unspecified: Secondary | ICD-10-CM

## 2014-11-18 LAB — BASIC METABOLIC PANEL
ANION GAP: 12 (ref 5–15)
BUN: 29 mg/dL — ABNORMAL HIGH (ref 6–23)
CHLORIDE: 98 mmol/L (ref 96–112)
CO2: 27 mmol/L (ref 19–32)
Calcium: 9.3 mg/dL (ref 8.4–10.5)
Creatinine, Ser: 8.02 mg/dL — ABNORMAL HIGH (ref 0.50–1.10)
GFR calc Af Amer: 5 mL/min — ABNORMAL LOW (ref >90)
GFR calc non Af Amer: 4 mL/min — ABNORMAL LOW (ref >90)
GLUCOSE: 73 mg/dL (ref 70–99)
POTASSIUM: 5 mmol/L (ref 3.5–5.1)
SODIUM: 137 mmol/L (ref 135–145)

## 2014-11-18 LAB — CBC
HCT: 27.9 % — ABNORMAL LOW (ref 36.0–46.0)
Hemoglobin: 9 g/dL — ABNORMAL LOW (ref 12.0–15.0)
MCH: 27.1 pg (ref 26.0–34.0)
MCHC: 32.3 g/dL (ref 30.0–36.0)
MCV: 84 fL (ref 78.0–100.0)
Platelets: 412 10*3/uL — ABNORMAL HIGH (ref 150–400)
RBC: 3.32 MIL/uL — ABNORMAL LOW (ref 3.87–5.11)
RDW: 17.2 % — ABNORMAL HIGH (ref 11.5–15.5)
WBC: 10.2 10*3/uL (ref 4.0–10.5)

## 2014-11-18 LAB — TSH: TSH: 1.919 u[IU]/mL (ref 0.350–4.500)

## 2014-11-18 LAB — AMMONIA: Ammonia: 32 umol/L (ref 11–32)

## 2014-11-18 LAB — TROPONIN I: TROPONIN I: 0.03 ng/mL (ref <0.031)

## 2014-11-18 MED ORDER — VANCOMYCIN HCL IN DEXTROSE 1-5 GM/200ML-% IV SOLN
1000.0000 mg | INTRAVENOUS | Status: DC
  Administered 2014-11-19: 1000 mg via INTRAVENOUS
  Filled 2014-11-18 (×2): qty 200

## 2014-11-18 NOTE — Progress Notes (Signed)
Speech Language Pathology Treatment: Dysphagia  Patient Details Name: Annette Roach MRN: [id number] DOB: [birth year] Today's Date: 11/18/2014 Time: 5183-3582 SLP Time Calculation (min) (ACUTE ONLY): 12 min  Assessment / Plan / Recommendation Clinical Impression  Pt demonstrates significant improvement in arousal, though still moderate confused. Minimal verbalizations, but responsive to multimodal and especially contextual cues for PO intake. Consumption of PO was automatic and appropriate. One delayed cough noted following bites of dry cracker (pt missing dentition). Recommend pt initiate a dys 2 (fine chop) diet with thin liquids with assistance as needed to encourage feeding. Pills may be easiest whole in puree, but Ok to try try with water, or crushed if she pockets them. SLP will f/u for tolerance and potential for upgrade.    HPI HPI: Annette Roach is a 81 y.o. female with past medical history of congestive heart failure, hypertension, GERD, hyperlipidemia, asthma, COPD, diabetes mellitus, end-stage renal disease on dialysis (Tuesday/Thursday/Saturday), atrial fibrillation (only on aspirin), who presents with the abdominal pain and chest pain. Patient is transferred from [facility] per Dr. Wilder Glade. Patient presented to the [facility], with initial presentation of chest pain and altered mental status. Per Dr. Wilder Glade, she has intermittent mild chest pain in the past month. She seems to have altered mental status and confusion recently. She was found to have fever of 102.2, and shortness of breath. They did ABG which showed pH 7.49, PCO2 47, PO2 65%. Patient did not have cough. Patient was suspected to have severe sepsis in [facility]. Patient also complains of abdominal pain which also resolved currently.  Of note, she reports that she was diagnosed as GIST (GI stromal tumor?) at about 3 weeks ago. No surgery was done yet (no document available now).  Currently, patient denies fever,  chills, headaches, cough, chest pain, SOB, abdominal pain, diarrhea, constipation, dysuria, urgency, frequency, hematuria, skin rashes, joint pain or leg swelling. No unilateral weakness, numbness or tingling sensations. No vision change or hearing loss.In [facility], CT-head was negative for acute abnormalities. Chest x-ray was negative for infiltration, but showed mild pulmonary edema. CT angiogram was negative for infiltration and pulmonary embolism.    Pertinent Vitals    SLP Plan  Continue with current plan of care    Recommendations Diet recommendations: Dysphagia 2 (fine chop);Thin liquid Liquids provided via: Straw;Cup Medication Administration: Whole meds with puree (Ok to try with water) Supervision: Staff to assist with self feeding Postural Changes and/or Swallow Maneuvers: Seated upright 90 degrees              Oral Care Recommendations: Oral care BID Plan: Continue with current plan of care    GO    [facility], MA CCC-SLP [phone]  Lynann Beaver 11/18/2014, 8:48 AM

## 2014-11-18 NOTE — Progress Notes (Signed)
[facility] Progress Note    Assessment/ Plan:   1. HCAP- rx per admit team on Cefepime / Vanco. Afebrile.  2. ESRD - HD TTS - only received over an hour of dialysis on 3/19 when she became hypotensive and unresponsive.  - Will plan on HD on  Mon and convert her to MWF regiment while she is in hospital; usually TTS as outpt. Will increase EDW to decrease risk of hypoperfusion especially with the mental status changes.  - will run even tomorrow to hopefully get her through a full treatment. 3. Vasovagal Syncope- cardiology consulted. ECHO EF 45-50-%- potentially for outpt stress test. No further symptoms 4. Hypertension/volume - bp 128/48 / 1kg under edw. Hydralazine 50 mg BID 5. Anemia - admit hgb 10.4- now 8.9/ fe weekly hd, no esa outpt, will start low dose/ watch CBC 6. Metabolic bone disease - Po vit d on hd/ phoslo binder/ correcr ca 10.4 /phos 4.7 / dc calitriol 7. COPD- home meds per admit  8. DM type 2- diet controlled in past 9. Constipation- Miralax 10. Myoclonus type symptoms of upper extrem = may benefit low dose Klonopin BUT hold for now with SYNCOPE on ADmit/ And On Neurontin 300 mg hs/needs PT input  Subjective:   Hypotensive, bradycardic, n/v after 1 hour on dialysis yesterday requiring a 658ml isotonic bolus AMS, confused but more cooperative today.   Objective:   BP 133/52 mmHg  Pulse 96  Temp(Src) 98.2 F (36.8 C) (Oral)  Resp 23  Ht 5\' 2"  (1.575 m)  Wt 69.1 kg (152 lb 5.4 oz)  BMI 27.86 kg/m2  SpO2 99%  Intake/Output Summary (Last 24 hours) at 11/18/14 0656 Last data filed at 11/17/14 2200  Gross per 24 hour  Intake     80 ml  Output   -300 ml  Net    380 ml   Weight change:   Physical Exam: General: Alert but disoriented, no acute distress.  Heart: RRR Lungs: dim bases, CTA, unlabored, RUA AVG +bruit Abdomen: soft, nontender +BS Extremities: no edema  Dialysis Access: R AVG +b.t  Imaging: Ct Head Wo  Contrast  11/17/2014   CLINICAL DATA:  ENCEPHALOPATHY, CONFUSION, PT. MOANING DURING SCAN,  EXAM: CT HEAD WITHOUT CONTRAST  TECHNIQUE: Contiguous axial images were obtained from the base of the skull through the vertex without intravenous contrast.  COMPARISON:  10/26/2014  FINDINGS: Ventricles are normal in size, for this patient's age, and normal in configuration.  There are no parenchymal masses or mass effect. Patchy [place] matter hypoattenuation is noted consistent with moderate chronic microvascular ischemic change.  No evidence of a cortical infarct.  There are no extra-axial masses or abnormal fluid collections.  There is no intracranial hemorrhage  Right maxillary sinus is mostly opacified by a combination of dependent fluid and mucosal thickening. There is moderate left maxillary sinus mucosal thickening. Significant mucosal thickening is noted along the middle and anterior right ethmoid air cells extending to the right frontal sinus which is occluded. Minor dependent secretions noted in the sphenoid sinuses. Clear left ethmoid air cells and left frontal sinus.  Focal soft tissue opacity in the right nasal cavity near the ostiomeatal complex on the right suggests a polyp.  Small frontal calvarial lucent lesion noted on prior study is stable.  IMPRESSION: 1. No acute intracranial abnormalities. 2. Age related volume loss. Moderate chronic microvascular ischemic change. 3. Significant sinus disease. Probable right nasal spine is cavity polyp. 4. No significant change from the prior head  CT.   Electronically Signed   By: Lajean Manes M.D.   On: 11/17/2014 10:38   Dg Chest Port 1 View  11/17/2014   CLINICAL DATA:  Vomiting.  EXAM: PORTABLE CHEST - 1 VIEW  COMPARISON:  11/13/2014  FINDINGS: Hazy airspace opacity projects in the left mid to lower lung zone obscuring the left hemidiaphragm. This is without significant change. Remainder of the lungs is clear. No evidence of pulmonary edema. No pneumothorax.   Enlargement of the cardiopericardial silhouette is stable. No mediastinal or hilar masses.  Bony thorax demineralized but grossly intact.  IMPRESSION: 1. Cardiomegaly, unchanged from the recent study. 2. Persistent left lower lung zone airspace opacity likely combination of infiltrate or atelectasis and a small effusion. This is also stable. No new abnormalities.   Electronically Signed   By: Lajean Manes M.D.   On: 11/17/2014 09:50    Labs: BMET  Recent Labs Lab 11/13/14 2110 11/15/14 0614 11/17/14 0851  NA 137 137 135  K 3.7 3.7 4.7  CL 94* 97 98  CO2 34* 26 26  GLUCOSE 161* 99 111*  BUN 14 29* 25*  CREATININE 5.82* 8.60* 7.74*  CALCIUM 9.2 9.0 9.2  PHOS  --  4.7*  --    CBC  Recent Labs Lab 11/13/14 2110 11/15/14 0614 11/17/14 0851  WBC 14.6* 8.9 8.9  NEUTROABS 13.0*  --   --   HGB 10.4* 8.9* 9.1*  HCT 32.0* 27.3* 28.1*  MCV 84.4 83.5 83.4  PLT 367 354 370    Medications:    . aspirin EC  81 mg Oral Daily  . azelastine  1 spray Each Nare Daily  . calcium acetate  667 mg Oral BID AC  . darbepoetin (ARANESP) injection - DIALYSIS  60 mcg Intravenous Q Sat-HD  . docusate sodium  100 mg Oral Daily  . gabapentin  300 mg Oral QHS  . heparin  5,000 Units Subcutaneous 3 times per day  . multivitamin  1 tablet Oral QHS  . pantoprazole  40 mg Oral Daily  . piperacillin-tazobactam (ZOSYN)  IV  2.25 g Intravenous 3 times per day  . polyethylene glycol  17 g Oral Daily  . simvastatin  5 mg Oral QHS  . vancomycin  1,000 mg Intravenous Q T,Th,Sa-HD      Otelia Santee, MD 11/18/2014, 6:56 AM

## 2014-11-18 NOTE — Progress Notes (Signed)
Subjective: No overnight events. Per RN patient refusing to take medications. Currently in bed whimpering with covers over head.   Objective: Current vital signs: BP 106/54 mmHg  Pulse 92  Temp(Src) 98.2 F (36.8 C) (Oral)  Resp 24  Ht 5\' 2"  (1.575 m)  Wt 69.1 kg (152 lb 5.4 oz)  BMI 27.86 kg/m2  SpO2 95% Vital signs in last 24 hours: Temp:  [97.8 F (36.6 C)-99.5 F (37.5 C)] 98.2 F (36.8 C) (03/20 0420) Pulse Rate:  [90-100] 92 (03/20 0700) Resp:  [16-26] 24 (03/20 0700) BP: (86-146)/(22-78) 106/54 mmHg (03/20 0700) SpO2:  [95 %-100 %] 95 % (03/20 0700)  Intake/Output from previous day: 03/19 0701 - 03/20 0700 In: 130 [I.V.:30; IV Piggyback:100] Out: -300  Intake/Output this shift:   Nutritional status: Diet NPO time specified  Neurologic Exam: Mental Status: Alert, makes eye contact, non-verbal, in bed moaning/whimpering. Not following commands Cranial Nerves: II: visual fields grossly normal, pupils equal, round, reactive to light  III,IV, VI: ptosis not present, extra-ocular motions intact bilaterally V,VII: smile symmetric, facial light touch sensation normal bilaterally Motor: Not following commands so unable to formally test. Appears to move all extremities symmetrically though Tone and bulk:normal tone throughout; no atrophy noted Lab Results: Basic Metabolic Panel:  Recent Labs Lab 11/13/14 2110 11/15/14 0614 11/17/14 0851  NA 137 137 135  K 3.7 3.7 4.7  CL 94* 97 98  CO2 34* 26 26  GLUCOSE 161* 99 111*  BUN 14 29* 25*  CREATININE 5.82* 8.60* 7.74*  CALCIUM 9.2 9.0 9.2  PHOS  --  4.7*  --     Liver Function Tests:  Recent Labs Lab 11/13/14 2110 11/15/14 0614  AST 28  --   ALT 22  --   ALKPHOS 75  --   BILITOT 1.0  --   PROT 8.8*  --   ALBUMIN 3.0* 2.2*   No results for input(s): LIPASE, AMYLASE in the last 168 hours. No results for input(s): AMMONIA in the last 168 hours.  CBC:  Recent Labs Lab 11/13/14 2110 11/15/14 0614  11/17/14 0851  WBC 14.6* 8.9 8.9  NEUTROABS 13.0*  --   --   HGB 10.4* 8.9* 9.1*  HCT 32.0* 27.3* 28.1*  MCV 84.4 83.5 83.4  PLT 367 354 370    Cardiac Enzymes:  Recent Labs Lab 11/14/14 2306 11/15/14 0614 11/17/14 0851 11/17/14 1952 11/18/14 0240  TROPONINI 0.03 0.03 0.04* 0.04* 0.03    Lipid Panel: No results for input(s): CHOL, TRIG, HDL, CHOLHDL, VLDL, LDLCALC in the last 168 hours.  CBG:  Recent Labs Lab 11/13/14 2001 11/14/14 0628 11/17/14 0810  GLUCAP 127* 107* 128*    Microbiology: Results for orders placed or performed during the hospital encounter of 11/13/14  Blood culture (routine x 2)     Status: None (Preliminary result)   Collection Time: 11/13/14 10:56 PM  Result Value Ref Range Status   Specimen Description BLOOD LEFT ANTECUBITAL  Final   Special Requests BOTTLES DRAWN AEROBIC AND ANAEROBIC 10CC EA  Final   Culture   Final           BLOOD CULTURE RECEIVED NO GROWTH TO DATE CULTURE WILL BE HELD FOR 5 DAYS BEFORE ISSUING A FINAL NEGATIVE REPORT Note: Culture results may be compromised due to an excessive volume of blood received in culture bottles. Performed at [facility]    Report Status PENDING  Incomplete  Blood culture (routine x 2)     Status: None (  Preliminary result)   Collection Time: 11/13/14 11:03 PM  Result Value Ref Range Status   Specimen Description BLOOD LEFT HAND  Final   Special Requests BOTTLES DRAWN AEROBIC AND ANAEROBIC 5CC EA  Final   Culture   Final           BLOOD CULTURE RECEIVED NO GROWTH TO DATE CULTURE WILL BE HELD FOR 5 DAYS BEFORE ISSUING A FINAL NEGATIVE REPORT Performed at [facility]    Report Status PENDING  Incomplete  MRSA PCR Screening     Status: None   Collection Time: 11/14/14 12:33 AM  Result Value Ref Range Status   MRSA by PCR NEGATIVE NEGATIVE Final    Comment:        The GeneXpert MRSA Assay (FDA approved for NASAL specimens only), is one component of a comprehensive MRSA  colonization surveillance program. It is not intended to diagnose MRSA infection nor to guide or monitor treatment for MRSA infections.   MRSA PCR Screening     Status: None   Collection Time: 11/17/14  9:39 PM  Result Value Ref Range Status   MRSA by PCR NEGATIVE NEGATIVE Final    Comment:        The GeneXpert MRSA Assay (FDA approved for NASAL specimens only), is one component of a comprehensive MRSA colonization surveillance program. It is not intended to diagnose MRSA infection nor to guide or monitor treatment for MRSA infections.     Coagulation Studies: No results for input(s): LABPROT, INR in the last 72 hours.  Imaging: Ct Head Wo Contrast  11/17/2014   CLINICAL DATA:  ENCEPHALOPATHY, CONFUSION, PT. MOANING DURING SCAN,  EXAM: CT HEAD WITHOUT CONTRAST  TECHNIQUE: Contiguous axial images were obtained from the base of the skull through the vertex without intravenous contrast.  COMPARISON:  10/26/2014  FINDINGS: Ventricles are normal in size, for this patient's age, and normal in configuration.  There are no parenchymal masses or mass effect. Patchy [place] matter hypoattenuation is noted consistent with moderate chronic microvascular ischemic change.  No evidence of a cortical infarct.  There are no extra-axial masses or abnormal fluid collections.  There is no intracranial hemorrhage  Right maxillary sinus is mostly opacified by a combination of dependent fluid and mucosal thickening. There is moderate left maxillary sinus mucosal thickening. Significant mucosal thickening is noted along the middle and anterior right ethmoid air cells extending to the right frontal sinus which is occluded. Minor dependent secretions noted in the sphenoid sinuses. Clear left ethmoid air cells and left frontal sinus.  Focal soft tissue opacity in the right nasal cavity near the ostiomeatal complex on the right suggests a polyp.  Small frontal calvarial lucent lesion noted on prior study is stable.   IMPRESSION: 1. No acute intracranial abnormalities. 2. Age related volume loss. Moderate chronic microvascular ischemic change. 3. Significant sinus disease. Probable right nasal spine is cavity polyp. 4. No significant change from the prior head CT.   Electronically Signed   By: Lajean Manes M.D.   On: 11/17/2014 10:38   Dg Chest Port 1 View  11/17/2014   CLINICAL DATA:  Vomiting.  EXAM: PORTABLE CHEST - 1 VIEW  COMPARISON:  11/13/2014  FINDINGS: Hazy airspace opacity projects in the left mid to lower lung zone obscuring the left hemidiaphragm. This is without significant change. Remainder of the lungs is clear. No evidence of pulmonary edema. No pneumothorax.  Enlargement of the cardiopericardial silhouette is stable. No mediastinal or hilar masses.  Bony thorax  demineralized but grossly intact.  IMPRESSION: 1. Cardiomegaly, unchanged from the recent study. 2. Persistent left lower lung zone airspace opacity likely combination of infiltrate or atelectasis and a small effusion. This is also stable. No new abnormalities.   Electronically Signed   By: Lajean Manes M.D.   On: 11/17/2014 09:50    Medications:  Scheduled: . aspirin EC  81 mg Oral Daily  . azelastine  1 spray Each Nare Daily  . calcium acetate  667 mg Oral BID AC  . darbepoetin (ARANESP) injection - DIALYSIS  60 mcg Intravenous Q Sat-HD  . docusate sodium  100 mg Oral Daily  . gabapentin  300 mg Oral QHS  . heparin  5,000 Units Subcutaneous 3 times per day  . multivitamin  1 tablet Oral QHS  . pantoprazole  40 mg Oral Daily  . piperacillin-tazobactam (ZOSYN)  IV  2.25 g Intravenous 3 times per day  . polyethylene glycol  17 g Oral Daily  . simvastatin  5 mg Oral QHS  . vancomycin  1,000 mg Intravenous Q T,Th,Sa-HD    Assessment/Plan: 81y/o woman with history of DM, HA, ESRD on HD, HTN admitted with rectal pain and syncope. During HD had profound hypotension (57/32) and bradycardia down to 31. Became unresponsive during this  event. Since then has had decreased verbal output. Of note her daughter states she has had similar episodes in the past where she becomes non-verbal and these are typically related to anxiety or pain. Unclear etiology of her current presentation. Question if there is a psych component.   -cardiac workup for bradycardia -check ammonia, TSH -check MRI brain -check EEG   LOS: 5 days   Jim Like, DO [facility] [phone]  If 7pm- 7am, please page neurology on call as listed in [place]. 11/18/2014  8:31 AM

## 2014-11-18 NOTE — Progress Notes (Signed)
UR Completed.  [phone]  

## 2014-11-18 NOTE — Progress Notes (Signed)
[facility] PROGRESS NOTE  Annette Roach PPJ:[id number] DOB: 30-Jan-1928 DOA: 11/13/2014 PCP: Gilford Rile, MD  Assessment/Plan:  1-Syncope:  ECHO with Ef 45 % , Right BBB. Cardiology consulted.  Patient during dialysis 3-19 became hypotensive SBP in the 50, HR in the 30. Patient was unresponsive.  Received IV bolus, vitals at this time stable.  No candidate for pacemaker per cardiology.  Cycle Enzymes mildly elevation.   2-HCAP;  continue with  vancomycin.  WBC trending down.  -Vomiting episodes, concern for aspiration.  -Repeated Chest x ray with persistent left lower zone opacity.  -Continue with Zosyn.   3-Encephalopathy:  CT head no acute intracranial abnormalities.  Neurology consulted. Does she needs MRI brain ?  TSH, Ammonia ordered.  Follow some command, mute.   Anemia of chronic diseases, renal failure. . Hb at 8.9. Hb at 9.8 last month.   ESRD; nephrologist Following.   History stomach cancer; has appointment with oncologist,.   Constipation: Miralax, sorbitol. Had BM.     Code Status: full code.  Family Communication; no family at bedside.  Disposition Plan: remain inpatient    Consultants:  nephrologist   Cardiology  neurologist  Procedures:  ECHO; Ef 45 %    Antibiotics:  Cefepime 3-15---3-19  Zosyn 3-19  Vancomycin 3-15  HPI/Subjective: Alert, follow some command, does not answer questions, smile.    Objective: Filed Vitals:   11/18/14 0700  BP: 106/54  Pulse: 92  Temp:   Resp: 24    Intake/Output Summary (Last 24 hours) at 11/18/14 0834 Last data filed at 11/18/14 0533  Gross per 24 hour  Intake    130 ml  Output      0 ml  Net    130 ml   Filed Weights   11/15/14 2158 11/16/14 2131 11/17/14 0655  Weight: 70.5 kg (155 lb 6.8 oz) 70.7 kg (155 lb 13.8 oz) 69.1 kg (152 lb 5.4 oz)    Exam:   General: Alert in no distress.   Cardiovascular: S 1, S 2 RRR  Respiratory: CTA  Abdomen: BS present, soft,  nt  Musculoskeletal: no edema.   Neuro exam: Alert, follows command, does not answer questions.    Data Reviewed: Basic Metabolic Panel:  Recent Labs Lab 11/13/14 2110 11/15/14 0614 11/17/14 0851  NA 137 137 135  K 3.7 3.7 4.7  CL 94* 97 98  CO2 34* 26 26  GLUCOSE 161* 99 111*  BUN 14 29* 25*  CREATININE 5.82* 8.60* 7.74*  CALCIUM 9.2 9.0 9.2  PHOS  --  4.7*  --    Liver Function Tests:  Recent Labs Lab 11/13/14 2110 11/15/14 0614  AST 28  --   ALT 22  --   ALKPHOS 75  --   BILITOT 1.0  --   PROT 8.8*  --   ALBUMIN 3.0* 2.2*   No results for input(s): LIPASE, AMYLASE in the last 168 hours. No results for input(s): AMMONIA in the last 168 hours. CBC:  Recent Labs Lab 11/13/14 2110 11/15/14 0614 11/17/14 0851  WBC 14.6* 8.9 8.9  NEUTROABS 13.0*  --   --   HGB 10.4* 8.9* 9.1*  HCT 32.0* 27.3* 28.1*  MCV 84.4 83.5 83.4  PLT 367 354 370   Cardiac Enzymes:  Recent Labs Lab 11/14/14 2306 11/15/14 0614 11/17/14 0851 11/17/14 1952 11/18/14 0240  TROPONINI 0.03 0.03 0.04* 0.04* 0.03   BNP (last 3 results) No results for input(s): BNP in the last 8760 hours.  ProBNP (last  3 results) No results for input(s): PROBNP in the last 8760 hours.  CBG:  Recent Labs Lab 11/13/14 2001 11/14/14 0628 11/17/14 0810  GLUCAP 127* 107* 128*    Recent Results (from the past 240 hour(s))  Blood culture (routine x 2)     Status: None (Preliminary result)   Collection Time: 11/13/14 10:56 PM  Result Value Ref Range Status   Specimen Description BLOOD LEFT ANTECUBITAL  Final   Special Requests BOTTLES DRAWN AEROBIC AND ANAEROBIC 10CC EA  Final   Culture   Final           BLOOD CULTURE RECEIVED NO GROWTH TO DATE CULTURE WILL BE HELD FOR 5 DAYS BEFORE ISSUING A FINAL NEGATIVE REPORT Note: Culture results may be compromised due to an excessive volume of blood received in culture bottles. Performed at [facility]    Report Status PENDING  Incomplete   Blood culture (routine x 2)     Status: None (Preliminary result)   Collection Time: 11/13/14 11:03 PM  Result Value Ref Range Status   Specimen Description BLOOD LEFT HAND  Final   Special Requests BOTTLES DRAWN AEROBIC AND ANAEROBIC 5CC EA  Final   Culture   Final           BLOOD CULTURE RECEIVED NO GROWTH TO DATE CULTURE WILL BE HELD FOR 5 DAYS BEFORE ISSUING A FINAL NEGATIVE REPORT Performed at [facility]    Report Status PENDING  Incomplete  MRSA PCR Screening     Status: None   Collection Time: 11/14/14 12:33 AM  Result Value Ref Range Status   MRSA by PCR NEGATIVE NEGATIVE Final    Comment:        The GeneXpert MRSA Assay (FDA approved for NASAL specimens only), is one component of a comprehensive MRSA colonization surveillance program. It is not intended to diagnose MRSA infection nor to guide or monitor treatment for MRSA infections.   MRSA PCR Screening     Status: None   Collection Time: 11/17/14  9:39 PM  Result Value Ref Range Status   MRSA by PCR NEGATIVE NEGATIVE Final    Comment:        The GeneXpert MRSA Assay (FDA approved for NASAL specimens only), is one component of a comprehensive MRSA colonization surveillance program. It is not intended to diagnose MRSA infection nor to guide or monitor treatment for MRSA infections.      Studies: Ct Head Wo Contrast  11/17/2014   CLINICAL DATA:  ENCEPHALOPATHY, CONFUSION, PT. MOANING DURING SCAN,  EXAM: CT HEAD WITHOUT CONTRAST  TECHNIQUE: Contiguous axial images were obtained from the base of the skull through the vertex without intravenous contrast.  COMPARISON:  10/26/2014  FINDINGS: Ventricles are normal in size, for this patient's age, and normal in configuration.  There are no parenchymal masses or mass effect. Patchy [place] matter hypoattenuation is noted consistent with moderate chronic microvascular ischemic change.  No evidence of a cortical infarct.  There are no extra-axial masses or abnormal  fluid collections.  There is no intracranial hemorrhage  Right maxillary sinus is mostly opacified by a combination of dependent fluid and mucosal thickening. There is moderate left maxillary sinus mucosal thickening. Significant mucosal thickening is noted along the middle and anterior right ethmoid air cells extending to the right frontal sinus which is occluded. Minor dependent secretions noted in the sphenoid sinuses. Clear left ethmoid air cells and left frontal sinus.  Focal soft tissue opacity in the right nasal cavity  near the ostiomeatal complex on the right suggests a polyp.  Small frontal calvarial lucent lesion noted on prior study is stable.  IMPRESSION: 1. No acute intracranial abnormalities. 2. Age related volume loss. Moderate chronic microvascular ischemic change. 3. Significant sinus disease. Probable right nasal spine is cavity polyp. 4. No significant change from the prior head CT.   Electronically Signed   By: Lajean Manes M.D.   On: 11/17/2014 10:38   Dg Chest Port 1 View  11/17/2014   CLINICAL DATA:  Vomiting.  EXAM: PORTABLE CHEST - 1 VIEW  COMPARISON:  11/13/2014  FINDINGS: Hazy airspace opacity projects in the left mid to lower lung zone obscuring the left hemidiaphragm. This is without significant change. Remainder of the lungs is clear. No evidence of pulmonary edema. No pneumothorax.  Enlargement of the cardiopericardial silhouette is stable. No mediastinal or hilar masses.  Bony thorax demineralized but grossly intact.  IMPRESSION: 1. Cardiomegaly, unchanged from the recent study. 2. Persistent left lower lung zone airspace opacity likely combination of infiltrate or atelectasis and a small effusion. This is also stable. No new abnormalities.   Electronically Signed   By: Lajean Manes M.D.   On: 11/17/2014 09:50    Scheduled Meds: . aspirin EC  81 mg Oral Daily  . azelastine  1 spray Each Nare Daily  . calcium acetate  667 mg Oral BID AC  . darbepoetin (ARANESP) injection -  DIALYSIS  60 mcg Intravenous Q Sat-HD  . docusate sodium  100 mg Oral Daily  . gabapentin  300 mg Oral QHS  . heparin  5,000 Units Subcutaneous 3 times per day  . multivitamin  1 tablet Oral QHS  . pantoprazole  40 mg Oral Daily  . piperacillin-tazobactam (ZOSYN)  IV  2.25 g Intravenous 3 times per day  . polyethylene glycol  17 g Oral Daily  . simvastatin  5 mg Oral QHS  . vancomycin  1,000 mg Intravenous Q T,Th,Sa-HD   Continuous Infusions:   Principal Problem:   HCAP (healthcare-associated pneumonia) Active Problems:   End stage renal disease on dialysis   Vasovagal syncope   Leukocytosis   Bradycardia    Time spent: 35 minutes.     Niel Hummer A  [facility] Pager [phone]. If 7PM-7AM, please contact night-coverage at [email or web address], password [facility] 11/18/2014, 8:34 AM  LOS: 5 days

## 2014-11-18 NOTE — Clinical Social Work Placement (Addendum)
Clinical Social Work Department CLINICAL SOCIAL WORK PLACEMENT NOTE 11/18/2014  Patient:  Annette Roach, Annette Roach  Account Number:  [id number] Admit date:  11/13/2014  Clinical Social Worker:  Carrington Clamp, LCSWA  Date/time:  11/18/2014 12:20 PM  Clinical Social Work is seeking post-discharge placement for this patient at the following level of care:   SKILLED NURSING   (*CSW will update this form in Epic as items are completed)   11/16/2014  Patient/family provided with [place] Department of Clinical Social Work's list of facilities offering this level of care within the geographic area requested by the patient (or if unable, by the patient's family).  11/16/2014  Patient/family informed of their freedom to choose among providers that offer the needed level of care, that participate in Medicare, Medicaid or managed care program needed by the patient, have an available bed and are willing to accept the patient.  11/16/2014  Patient/family informed of [facility]' ownership interest in [facility], as well as of the fact that they are under no obligation to receive care at this facility.  PASARR submitted to EDS on Existing PASARR number received on Existing  FL2 transmitted to all facilities in geographic area requested by pt/family on  11/18/2014 FL2 transmitted to all facilities within larger geographic area on   Patient informed that his/her managed care company has contracts with or will negotiate with  certain facilities, including the following:     Patient/family informed of bed offers received: 11/20/14  Patient chooses bed at [facility] Physician recommends and patient chooses bed at    Patient to be transferred to [facility] on 11/20/14   Patient to be transferred to facility by ambulance Patient and family notified of transfer on 11/20/14 Name of family member notified: Erin Fulling, and another daughter at the bedside.   The following physician  request were entered in Epic:  Additional Comments: 11/20/14: Family's first choice was [facility], however they do not take dialysis patients. At [facility] request, patient's dialysis changed from TTS to MWF during her stay at the facility. Ms. Iyahna Obriant will transport patient to dialysis Wednesday for her 12 noon chair time as the facility's Lucianne Lei is being repaired. Ms. Lucarelli was informed at the bedside.  Wafa Martes Givens, MSW, LCSW - (Discharging CSW) Licensed Clinical Social Worker Clinical Social Work [place] [phone]       Carrington Clamp, [place] Weekend Clinical Social Worker [phone]

## 2014-11-19 LAB — RENAL FUNCTION PANEL
ALBUMIN: 2.1 g/dL — AB (ref 3.5–5.2)
ANION GAP: 13 (ref 5–15)
BUN: 42 mg/dL — ABNORMAL HIGH (ref 6–23)
CALCIUM: 8.8 mg/dL (ref 8.4–10.5)
CO2: 26 mmol/L (ref 19–32)
CREATININE: 10.02 mg/dL — AB (ref 0.50–1.10)
Chloride: 97 mmol/L (ref 96–112)
GFR calc Af Amer: 4 mL/min — ABNORMAL LOW (ref >90)
GFR calc non Af Amer: 3 mL/min — ABNORMAL LOW (ref >90)
Glucose, Bld: 113 mg/dL — ABNORMAL HIGH (ref 70–99)
PHOSPHORUS: 6 mg/dL — AB (ref 2.3–4.6)
Potassium: 4.5 mmol/L (ref 3.5–5.1)
Sodium: 136 mmol/L (ref 135–145)

## 2014-11-19 MED ORDER — LIDOCAINE-PRILOCAINE 2.5-2.5 % EX CREA: 1.0000 "application " | TOPICAL_CREAM | CUTANEOUS | Status: DC | PRN

## 2014-11-19 MED ORDER — NEPRO/CARBSTEADY PO LIQD
237.0000 mL | ORAL | Status: DC | PRN
  Filled 2014-11-19: qty 237

## 2014-11-19 MED ORDER — PENTAFLUOROPROP-TETRAFLUOROETH EX AERO: 1.0000 "application " | INHALATION_SPRAY | CUTANEOUS | Status: DC | PRN

## 2014-11-19 MED ORDER — ALTEPLASE 2 MG IJ SOLR
2.0000 mg | Freq: Once | INTRAMUSCULAR | Status: AC | PRN
  Filled 2014-11-19: qty 2

## 2014-11-19 MED ORDER — LIDOCAINE HCL (PF) 1 % IJ SOLN: 5.0000 mL | INTRAMUSCULAR | Status: DC | PRN

## 2014-11-19 MED ORDER — HEPARIN SODIUM (PORCINE) 1000 UNIT/ML DIALYSIS
1000.0000 [IU] | INTRAMUSCULAR | Status: DC | PRN
  Filled 2014-11-19: qty 1

## 2014-11-19 MED ORDER — SODIUM CHLORIDE 0.9 % IV SOLN: 100.0000 mL | INTRAVENOUS | Status: DC | PRN

## 2014-11-19 NOTE — Progress Notes (Signed)
ANTIBIOTIC CONSULT NOTE - FOLLOW UP  Pharmacy Consult for Vancomycin and Zosyn Indication: r/o PNA and possible aspiration PNA  No Known Allergies  Patient Measurements: Height: 5\' 2"  (157.5 cm) Weight: 156 lb 1.4 oz (70.8 kg) IBW/kg (Calculated) : 50.1  Vital Signs: Temp: 98.1 F (36.7 C) (03/21 1119) Temp Source: Oral (03/21 1119) BP: 148/63 mmHg (03/21 1119) Pulse Rate: 95 (03/21 1119) Intake/Output from previous day: 03/20 0701 - 03/21 0700 In: 150 [IV Piggyback:150] Out: 0  Intake/Output from this shift: Total I/O In: 120 [P.O.:120] Out: 11 [Other:11]  Labs:  Recent Labs  11/17/14 0851 11/18/14 1240 11/19/14 0700  WBC 8.9 10.2  --   HGB 9.1* 9.0*  --   PLT 370 412*  --   CREATININE 7.74* 8.02* 10.02*   Estimated Creatinine Clearance: 3.8 mL/min (by C-G formula based on Cr of 10.02). No results for input(s): VANCOTROUGH, VANCOPEAK, VANCORANDOM, GENTTROUGH, GENTPEAK, GENTRANDOM, TOBRATROUGH, TOBRAPEAK, TOBRARND, AMIKACINPEAK, AMIKACINTROU, AMIKACIN in the last 72 hours.   Microbiology: Recent Results (from the past 720 hour(s))  Culture, blood (routine x 2)     Status: None   Collection Time: 10/27/14 11:15 AM  Result Value Ref Range Status   Specimen Description BLOOD LEFT ARM  Final   Special Requests   Final    BOTTLES DRAWN AEROBIC AND ANAEROBIC 10CC BLUE 5CC RED   Culture   Final    NO GROWTH 5 DAYS Performed at [facility]    Report Status 11/02/2014 FINAL  Final  Culture, blood (routine x 2)     Status: None   Collection Time: 10/27/14 11:23 AM  Result Value Ref Range Status   Specimen Description BLOOD LEFT HAND  Final   Special Requests BOTTLES DRAWN AEROBIC ONLY 10CC  Final   Culture   Final    NO GROWTH 5 DAYS Note: Culture results may be compromised due to an excessive volume of blood received in culture bottles. Performed at [facility]    Report Status 11/02/2014 FINAL  Final  Blood culture (routine x 2)     Status:  None (Preliminary result)   Collection Time: 11/13/14 10:56 PM  Result Value Ref Range Status   Specimen Description BLOOD LEFT ANTECUBITAL  Final   Special Requests BOTTLES DRAWN AEROBIC AND ANAEROBIC 10CC EA  Final   Culture   Final           BLOOD CULTURE RECEIVED NO GROWTH TO DATE CULTURE WILL BE HELD FOR 5 DAYS BEFORE ISSUING A FINAL NEGATIVE REPORT Note: Culture results may be compromised due to an excessive volume of blood received in culture bottles. Performed at [facility]    Report Status PENDING  Incomplete  Blood culture (routine x 2)     Status: None (Preliminary result)   Collection Time: 11/13/14 11:03 PM  Result Value Ref Range Status   Specimen Description BLOOD LEFT HAND  Final   Special Requests BOTTLES DRAWN AEROBIC AND ANAEROBIC 5CC EA  Final   Culture   Final           BLOOD CULTURE RECEIVED NO GROWTH TO DATE CULTURE WILL BE HELD FOR 5 DAYS BEFORE ISSUING A FINAL NEGATIVE REPORT Performed at [facility]    Report Status PENDING  Incomplete  MRSA PCR Screening     Status: None   Collection Time: 11/14/14 12:33 AM  Result Value Ref Range Status   MRSA by PCR NEGATIVE NEGATIVE Final    Comment:  The GeneXpert MRSA Assay (FDA approved for NASAL specimens only), is one component of a comprehensive MRSA colonization surveillance program. It is not intended to diagnose MRSA infection nor to guide or monitor treatment for MRSA infections.   MRSA PCR Screening     Status: None   Collection Time: 11/17/14  9:39 PM  Result Value Ref Range Status   MRSA by PCR NEGATIVE NEGATIVE Final    Comment:        The GeneXpert MRSA Assay (FDA approved for NASAL specimens only), is one component of a comprehensive MRSA colonization surveillance program. It is not intended to diagnose MRSA infection nor to guide or monitor treatment for MRSA infections.     Anti-infectives    Start     Dose/Rate Route Frequency Ordered Stop   11/19/14 1200   vancomycin (VANCOCIN) IVPB 1000 mg/200 mL premix     1,000 mg 200 mL/hr over 60 Minutes Intravenous Every M-W-F (Hemodialysis) 11/18/14 0920     11/17/14 1200  piperacillin-tazobactam (ZOSYN) IVPB 2.25 g     2.25 g 100 mL/hr over 30 Minutes Intravenous 3 times per day 11/17/14 0947     11/16/14 0000  levofloxacin (LEVAQUIN) 250 MG tablet     250 mg Oral Daily 11/16/14 1136     11/15/14 1800  ceFEPIme (MAXIPIME) 2 g in dextrose 5 % 50 mL IVPB  Status:  Discontinued     2 g 100 mL/hr over 30 Minutes Intravenous Every T-Th-Sa (1800) 11/13/14 2323 11/17/14 0934   11/15/14 1200  vancomycin (VANCOCIN) IVPB 1000 mg/200 mL premix  Status:  Discontinued     1,000 mg 200 mL/hr over 60 Minutes Intravenous Every T-Th-Sa (Hemodialysis) 11/13/14 2323 11/18/14 0920   11/13/14 2330  ceFEPIme (MAXIPIME) 2 g in dextrose 5 % 50 mL IVPB     2 g 100 mL/hr over 30 Minutes Intravenous  Once 11/13/14 2323 11/14/14 0410   11/13/14 2315  ceFEPIme (MAXIPIME) 1 g in dextrose 5 % 50 mL IVPB  Status:  Discontinued     1 g 100 mL/hr over 30 Minutes Intravenous 3 times per day 11/13/14 2309 11/13/14 2323   11/13/14 2245  vancomycin (VANCOCIN) 1,500 mg in sodium chloride 0.9 % 500 mL IVPB     1,500 mg 250 mL/hr over 120 Minutes Intravenous  Once 11/13/14 2233 11/14/14 0109   11/13/14 2245  piperacillin-tazobactam (ZOSYN) IVPB 3.375 g     3.375 g 100 mL/hr over 30 Minutes Intravenous  Once 11/13/14 2233 11/13/14 2340      Assessment: 81 y/o F here with syncope, possible infiltrate on CXR, pt is ESRD on HD TTS pta now changed to MWF on antibiotics d#7/8 for pneumonia. WBC wnl, currently afebrile. Patient tolerated HD this AM (BFR 325 for 4 hrs).   Goal of Therapy:  Pre-HD Vancomycin level of 15-25 mcg/mL Eradication of infection  Plan:  Continue Vancomycin 1000 mg IV qHD MWF Continue Zosyn 2.25g IV Q8 F/U abx LOT (Aim for 8 days of abx)  Sloan Leiter, PharmD, BCPS Clinical  Pharmacist [phone] 11/19/2014,12:36 PM

## 2014-11-19 NOTE — Progress Notes (Signed)
Agree that pacemaker is not indicated - nor is she a candidate at this time. Could consider ischemia w/u at some point in the near future - high likelihood of CAD, but not clear that lead to her syncope. Will need to get over her pneumonia. Work-up for moderate cardiomyopathy-  EF 45-50%.  Pixie Casino, MD, [facility] Attending Cardiologist [place]

## 2014-11-19 NOTE — Progress Notes (Signed)
PT Cancellation Note  Patient Details Name: Annette Roach MRN: [id number] DOB: 07-26-1928   Cancelled Treatment:    Reason Eval/Treat Not Completed: Patient at procedure or test/unavailable (pt currently in HD and unavailable)   Melford Aase 11/19/2014, 7:14 AM Elwyn Reach, [place]

## 2014-11-19 NOTE — Progress Notes (Signed)
Subjective: No further events.  Patient currently in dialysis and resting comfortably. Follows all commands.   Objective: Current vital signs: BP 148/63 mmHg  Pulse 95  Temp(Src) 98.1 F (36.7 C) (Oral)  Resp 20  Ht 5\' 2"  (1.575 m)  Wt 70.8 kg (156 lb 1.4 oz)  BMI 28.54 kg/m2  SpO2 97% Vital signs in last 24 hours: Temp:  [98.1 F (36.7 C)-99.5 F (37.5 C)] 98.1 F (36.7 C) (03/21 1119) Pulse Rate:  [85-103] 95 (03/21 1119) Resp:  [16-27] 20 (03/21 1119) BP: (102-148)/(42-88) 148/63 mmHg (03/21 1119) SpO2:  [92 %-100 %] 97 % (03/21 1119) Weight:  [70.8 kg (156 lb 1.4 oz)] 70.8 kg (156 lb 1.4 oz) (03/21 0700)  Intake/Output from previous day: 03/20 0701 - 03/21 0700 In: 150 [IV Piggyback:150] Out: 0  Intake/Output this shift: Total I/O In: 120 [P.O.:120] Out: 11 [Other:11] Nutritional status: DIET DYS 2  Neurologic Exam: General: NAD Mental Status: Alert to hospital and year but unable to state what month it is. Speech fluent without evidence of aphasia.  Able to follow 3 step commands without difficulty. Cranial Nerves: II:  Visual fields grossly normal, pupils equal, round, reactive to light and accommodation III,IV, VI: ptosis not present, extra-ocular motions intact bilaterally V,VII: smile symmetric, facial light touch sensation normal bilaterally VIII: hearing normal bilaterally IX,X: uvula rises symmetrically XI: bilateral shoulder shrug XII: midline tongue extension without atrophy or fasciculations  Motor: Moving all extremities 4/5 strength  Sensory: Pinprick and light touch intact throughout, bilaterally Deep Tendon Reflexes:  Depressed throughout 1+ and no AJ  Plantars: Right: downgoing   Left: downgoing    Lab Results: Basic Metabolic Panel:  Recent Labs Lab 11/13/14 2110 11/15/14 0614 11/17/14 0851 11/18/14 1240 11/19/14 0700  NA 137 137 135 137 136  K 3.7 3.7 4.7 5.0 4.5  CL 94* 97 98 98 97  CO2 34* 26 26 27 26   GLUCOSE 161* 99  111* 73 113*  BUN 14 29* 25* 29* 42*  CREATININE 5.82* 8.60* 7.74* 8.02* 10.02*  CALCIUM 9.2 9.0 9.2 9.3 8.8  PHOS  --  4.7*  --   --  6.0*    Liver Function Tests:  Recent Labs Lab 11/13/14 2110 11/15/14 0614 11/19/14 0700  AST 28  --   --   ALT 22  --   --   ALKPHOS 75  --   --   BILITOT 1.0  --   --   PROT 8.8*  --   --   ALBUMIN 3.0* 2.2* 2.1*   No results for input(s): LIPASE, AMYLASE in the last 168 hours.  Recent Labs Lab 11/18/14 0804  AMMONIA 32    CBC:  Recent Labs Lab 11/13/14 2110 11/15/14 0614 11/17/14 0851 11/18/14 1240  WBC 14.6* 8.9 8.9 10.2  NEUTROABS 13.0*  --   --   --   HGB 10.4* 8.9* 9.1* 9.0*  HCT 32.0* 27.3* 28.1* 27.9*  MCV 84.4 83.5 83.4 84.0  PLT 367 354 370 412*    Cardiac Enzymes:  Recent Labs Lab 11/14/14 2306 11/15/14 0614 11/17/14 0851 11/17/14 1952 11/18/14 0240  TROPONINI 0.03 0.03 0.04* 0.04* 0.03    Lipid Panel: No results for input(s): CHOL, TRIG, HDL, CHOLHDL, VLDL, LDLCALC in the last 168 hours.  CBG:  Recent Labs Lab 11/13/14 2001 11/14/14 0628 11/17/14 0810  GLUCAP 127* 107* 128*    Microbiology: Results for orders placed or performed during the hospital encounter of 11/13/14  Blood culture (routine  x 2)     Status: None (Preliminary result)   Collection Time: 11/13/14 10:56 PM  Result Value Ref Range Status   Specimen Description BLOOD LEFT ANTECUBITAL  Final   Special Requests BOTTLES DRAWN AEROBIC AND ANAEROBIC 10CC EA  Final   Culture   Final           BLOOD CULTURE RECEIVED NO GROWTH TO DATE CULTURE WILL BE HELD FOR 5 DAYS BEFORE ISSUING A FINAL NEGATIVE REPORT Note: Culture results may be compromised due to an excessive volume of blood received in culture bottles. Performed at [facility]    Report Status PENDING  Incomplete  Blood culture (routine x 2)     Status: None (Preliminary result)   Collection Time: 11/13/14 11:03 PM  Result Value Ref Range Status   Specimen  Description BLOOD LEFT HAND  Final   Special Requests BOTTLES DRAWN AEROBIC AND ANAEROBIC 5CC EA  Final   Culture   Final           BLOOD CULTURE RECEIVED NO GROWTH TO DATE CULTURE WILL BE HELD FOR 5 DAYS BEFORE ISSUING A FINAL NEGATIVE REPORT Performed at [facility]    Report Status PENDING  Incomplete  MRSA PCR Screening     Status: None   Collection Time: 11/14/14 12:33 AM  Result Value Ref Range Status   MRSA by PCR NEGATIVE NEGATIVE Final    Comment:        The GeneXpert MRSA Assay (FDA approved for NASAL specimens only), is one component of a comprehensive MRSA colonization surveillance program. It is not intended to diagnose MRSA infection nor to guide or monitor treatment for MRSA infections.   MRSA PCR Screening     Status: None   Collection Time: 11/17/14  9:39 PM  Result Value Ref Range Status   MRSA by PCR NEGATIVE NEGATIVE Final    Comment:        The GeneXpert MRSA Assay (FDA approved for NASAL specimens only), is one component of a comprehensive MRSA colonization surveillance program. It is not intended to diagnose MRSA infection nor to guide or monitor treatment for MRSA infections.     Coagulation Studies: No results for input(s): LABPROT, INR in the last 72 hours.  Imaging: Mr Herby Abraham Contrast  11/18/2014   CLINICAL DATA:  Encephalopathy and confusion.  Initial encounter.  EXAM: MRI HEAD WITHOUT CONTRAST  TECHNIQUE: Multiplanar, multiecho pulse sequences of the brain and surrounding structures were obtained without intravenous contrast.  COMPARISON:  CT head 11/17/2014.  FINDINGS: The patient was unable to remain motionless for the exam. Small or subtle lesions could be overlooked. Some series could not be obtained due to premature truncation of exam.  No evidence for acute infarction, hemorrhage, mass lesion, hydrocephalus, or extra-axial fluid. Moderate cerebral and cerebellar atrophy with extensive chronic microvascular ischemic change  throughout the periventricular and subcortical [place] matter. Moderate paranasal sinus disease affecting the maxillary and ethmoid regions. Posterior nasal cavity mass could represent antrochoanal polyp on the RIGHT. No midline abnormality is evident.  IMPRESSION: Prematurely truncated, and motion degraded exam, demonstrating no restricted diffusion to suggest acute infarction. Chronic changes as described.   Electronically Signed   By: Rolla Flatten M.D.   On: 11/18/2014 11:42    Medications:  Scheduled: . aspirin EC  81 mg Oral Daily  . azelastine  1 spray Each Nare Daily  . calcium acetate  667 mg Oral BID AC  . darbepoetin (ARANESP) injection - DIALYSIS  60 mcg Intravenous Q Sat-HD  . docusate sodium  100 mg Oral Daily  . gabapentin  300 mg Oral QHS  . heparin  5,000 Units Subcutaneous 3 times per day  . multivitamin  1 tablet Oral QHS  . pantoprazole  40 mg Oral Daily  . piperacillin-tazobactam (ZOSYN)  IV  2.25 g Intravenous 3 times per day  . polyethylene glycol  17 g Oral Daily  . simvastatin  5 mg Oral QHS  . vancomycin  1,000 mg Intravenous Q M,W,F-HD    Assessment/Plan: 81y/o woman with history of DM, HA, ESRD on HD, HTN admitted with rectal pain and syncope. During HD had profound hypotension (57/32) and bradycardia down to 31. Became unresponsive during this event. Currently in dialysis, awake and conversive.  She is unsure of the month but states she is in the hospital and year is 2016. MRI shows no acute infarct or etiology for confusion. Ammonia and TSH WNL.  EEG pending.   Recommend: 1) will follow EEG--if normal no further neurology work up recommended.    Etta Quill PA-C [facility] [phone]  11/19/2014, 11:58 AM  I personally participate in this patient's evaluation and management, including formulating the above clinical impression and management recommendations.  Rush Farmer M.D. [facility] [phone]

## 2014-11-19 NOTE — Progress Notes (Signed)
[facility] PROGRESS NOTE  Annette Roach TSV:[id number] DOB: 02/21/1928 DOA: 11/13/2014 PCP: Gilford Rile, MD  Assessment/Plan: 1-Syncope:  ECHO with Ef 45 % , Right BBB. Cardiology consulted.  Patient during dialysis 3-19 became hypotensive SBP in the 50, HR in the 30. Patient was unresponsive.  Received IV bolus, vitals at this time stable.  No candidate for pacemaker per cardiology.  Cycled Enzymes mildly elevation.   2-HCAP;  continue with  vancomycin.  WBC trending down.  -Vomiting episodes, concern for aspiration.  -Repeated Chest x ray with persistent left lower zone opacity.  -Continue with Zosyn.   3-Encephalopathy:  CT head no acute intracranial abnormalities.  Neurology consulted.  TSH 1.9, Ammonia 32. EEG pending. MRI negative for stroke.   Anemia of chronic diseases, renal failure. . Hb at 8.9. Hb at 9.8 last month.   ESRD; nephrologist Following.   History stomach cancer; has appointment with oncologist,.   Constipation: Miralax, sorbitol. Had BM.     Code Status: full code.  Family Communication; no family at bedside.  Disposition Plan: remain inpatient    Consultants:  nephrologist   Cardiology  neurologist  Procedures:  ECHO; Ef 45 %    Antibiotics:  Cefepime 3-15---3-19  Zosyn 3-19  Vancomycin 3-15  HPI/Subjective: Alert, follow some command, she is answering questions today. Still mildly confuse.    Objective: Filed Vitals:   11/19/14 1119  BP: 148/63  Pulse: 95  Temp: 98.1 F (36.7 C)  Resp: 20    Intake/Output Summary (Last 24 hours) at 11/19/14 1355 Last data filed at 11/19/14 1119  Gross per 24 hour  Intake    220 ml  Output     11 ml  Net    209 ml   Filed Weights   11/16/14 2131 11/17/14 0655 11/19/14 0700  Weight: 70.7 kg (155 lb 13.8 oz) 69.1 kg (152 lb 5.4 oz) 70.8 kg (156 lb 1.4 oz)    Exam:   General: Alert in no distress.   Cardiovascular: S 1, S 2 RRR  Respiratory: CTA  Abdomen: BS  present, soft, nt  Musculoskeletal: no edema.   Neuro exam: Alert, follows command, speech clear today.    Data Reviewed: Basic Metabolic Panel:  Recent Labs Lab 11/13/14 2110 11/15/14 0614 11/17/14 0851 11/18/14 1240 11/19/14 0700  NA 137 137 135 137 136  K 3.7 3.7 4.7 5.0 4.5  CL 94* 97 98 98 97  CO2 34* 26 26 27 26   GLUCOSE 161* 99 111* 73 113*  BUN 14 29* 25* 29* 42*  CREATININE 5.82* 8.60* 7.74* 8.02* 10.02*  CALCIUM 9.2 9.0 9.2 9.3 8.8  PHOS  --  4.7*  --   --  6.0*   Liver Function Tests:  Recent Labs Lab 11/13/14 2110 11/15/14 0614 11/19/14 0700  AST 28  --   --   ALT 22  --   --   ALKPHOS 75  --   --   BILITOT 1.0  --   --   PROT 8.8*  --   --   ALBUMIN 3.0* 2.2* 2.1*   No results for input(s): LIPASE, AMYLASE in the last 168 hours.  Recent Labs Lab 11/18/14 0804  AMMONIA 32   CBC:  Recent Labs Lab 11/13/14 2110 11/15/14 0614 11/17/14 0851 11/18/14 1240  WBC 14.6* 8.9 8.9 10.2  NEUTROABS 13.0*  --   --   --   HGB 10.4* 8.9* 9.1* 9.0*  HCT 32.0* 27.3* 28.1* 27.9*  MCV 84.4 83.5  83.4 84.0  PLT 367 354 370 412*   Cardiac Enzymes:  Recent Labs Lab 11/14/14 2306 11/15/14 0614 11/17/14 0851 11/17/14 1952 11/18/14 0240  TROPONINI 0.03 0.03 0.04* 0.04* 0.03   BNP (last 3 results) No results for input(s): BNP in the last 8760 hours.  ProBNP (last 3 results) No results for input(s): PROBNP in the last 8760 hours.  CBG:  Recent Labs Lab 11/13/14 2001 11/14/14 0628 11/17/14 0810  GLUCAP 127* 107* 128*    Recent Results (from the past 240 hour(s))  Blood culture (routine x 2)     Status: None (Preliminary result)   Collection Time: 11/13/14 10:56 PM  Result Value Ref Range Status   Specimen Description BLOOD LEFT ANTECUBITAL  Final   Special Requests BOTTLES DRAWN AEROBIC AND ANAEROBIC 10CC EA  Final   Culture   Final           BLOOD CULTURE RECEIVED NO GROWTH TO DATE CULTURE WILL BE HELD FOR 5 DAYS BEFORE ISSUING A FINAL  NEGATIVE REPORT Note: Culture results may be compromised due to an excessive volume of blood received in culture bottles. Performed at [facility]    Report Status PENDING  Incomplete  Blood culture (routine x 2)     Status: None (Preliminary result)   Collection Time: 11/13/14 11:03 PM  Result Value Ref Range Status   Specimen Description BLOOD LEFT HAND  Final   Special Requests BOTTLES DRAWN AEROBIC AND ANAEROBIC 5CC EA  Final   Culture   Final           BLOOD CULTURE RECEIVED NO GROWTH TO DATE CULTURE WILL BE HELD FOR 5 DAYS BEFORE ISSUING A FINAL NEGATIVE REPORT Performed at [facility]    Report Status PENDING  Incomplete  MRSA PCR Screening     Status: None   Collection Time: 11/14/14 12:33 AM  Result Value Ref Range Status   MRSA by PCR NEGATIVE NEGATIVE Final    Comment:        The GeneXpert MRSA Assay (FDA approved for NASAL specimens only), is one component of a comprehensive MRSA colonization surveillance program. It is not intended to diagnose MRSA infection nor to guide or monitor treatment for MRSA infections.   MRSA PCR Screening     Status: None   Collection Time: 11/17/14  9:39 PM  Result Value Ref Range Status   MRSA by PCR NEGATIVE NEGATIVE Final    Comment:        The GeneXpert MRSA Assay (FDA approved for NASAL specimens only), is one component of a comprehensive MRSA colonization surveillance program. It is not intended to diagnose MRSA infection nor to guide or monitor treatment for MRSA infections.      Studies: Mr Brain Wo Contrast  11/18/2014   CLINICAL DATA:  Encephalopathy and confusion.  Initial encounter.  EXAM: MRI HEAD WITHOUT CONTRAST  TECHNIQUE: Multiplanar, multiecho pulse sequences of the brain and surrounding structures were obtained without intravenous contrast.  COMPARISON:  CT head 11/17/2014.  FINDINGS: The patient was unable to remain motionless for the exam. Small or subtle lesions could be overlooked. Some  series could not be obtained due to premature truncation of exam.  No evidence for acute infarction, hemorrhage, mass lesion, hydrocephalus, or extra-axial fluid. Moderate cerebral and cerebellar atrophy with extensive chronic microvascular ischemic change throughout the periventricular and subcortical Demma matter. Moderate paranasal sinus disease affecting the maxillary and ethmoid regions. Posterior nasal cavity mass could represent antrochoanal polyp on the  RIGHT. No midline abnormality is evident.  IMPRESSION: Prematurely truncated, and motion degraded exam, demonstrating no restricted diffusion to suggest acute infarction. Chronic changes as described.   Electronically Signed   By: Rolla Flatten M.D.   On: 11/18/2014 11:42    Scheduled Meds: . aspirin EC  81 mg Oral Daily  . azelastine  1 spray Each Nare Daily  . calcium acetate  667 mg Oral BID AC  . darbepoetin (ARANESP) injection - DIALYSIS  60 mcg Intravenous Q Sat-HD  . docusate sodium  100 mg Oral Daily  . gabapentin  300 mg Oral QHS  . heparin  5,000 Units Subcutaneous 3 times per day  . multivitamin  1 tablet Oral QHS  . pantoprazole  40 mg Oral Daily  . piperacillin-tazobactam (ZOSYN)  IV  2.25 g Intravenous 3 times per day  . polyethylene glycol  17 g Oral Daily  . simvastatin  5 mg Oral QHS  . vancomycin  1,000 mg Intravenous Q M,W,F-HD   Continuous Infusions:   Principal Problem:   HCAP (healthcare-associated pneumonia) Active Problems:   End stage renal disease on dialysis   Vasovagal syncope   Leukocytosis   Bradycardia   Altered mental status   Encephalopathy    Time spent: 35 minutes.     Niel Hummer A  [facility] Pager [phone]. If 7PM-7AM, please contact night-coverage at [email or web address], password [facility] 11/19/2014, 1:55 PM  LOS: 6 days

## 2014-11-19 NOTE — Progress Notes (Signed)
OT Cancellation Note  Patient Details Name: Annette Roach MRN: [id number] DOB: [birth year]   Cancelled Treatment:    Reason Eval/Treat Not Completed: Patient at procedure or test/ unavailable. Pt in hemodialysis.  Almon Register [phone] 11/19/2014, 7:31 AM

## 2014-11-19 NOTE — Progress Notes (Signed)
Subjective:   Hungry, didn't get to eat breakfast before HD. Tolerating HD  Objective Filed Vitals:   11/19/14 0830 11/19/14 0900 11/19/14 0930 11/19/14 1000  BP: 141/81 135/68 141/62 142/69  Pulse: 85 96 95 96  Temp:      TempSrc:      Resp: 22 19 21 19   Height:      Weight:      SpO2:       Physical Exam General: alert and oriented. No acute distress Heart: RRR  Lungs: CTA, unlabored.  Abdomen: soft, nontender +BS  Extremities: no edema  Dialysis Access:  R AVG patent on HD.  Dialysis Orders: Center: [facility] on TTS . EDW 71.5 HD Bath 2.0k, 2.0ca Time 3hrs 20min Heparin 5000. Access RA AVG BFR 400 DFR AF1.5  Calcitriol po 1.60mcg q/HD Epogen 0 Units IV/HD Venofer 50mg  iv q hd  Assessment/Plan 1. HCAP- rx per admit team on/ Vanco and zosyn. Afebrile. 2. ESRD - HD TTS - off schedule, became hypotensive and unresponsive Saturday on HD- uf goal today 500- tolerating  3. Vasovagal Syncope- cardiology consulted. Poor candidate for pacemaker.  ECHO EF 45-50-%- potentially for outpt stress test. Neurology following. Head MRI- no acute infarct  4. Hypertension/volume - bp 133/62/ 1kg under edw. Hydralazine DC'd 5. Anemia- hgb 9/ fe weekly hd, no esa outpt, will start low dose/ watch CBC 6. Metabolic bone disease - Po vit d on hd/ phoslo binder/ correcr ca 10.3 /phos 4.7 / dc 'd calitriol 7. COPD- home meds per admit  8. DM type 2- diet controlled in past 9. Constipation- Miralax 10. Myoclonus type symptoms of upper extrem =  On Neurontin 300 mg hs/needs PT input 11. Nutrition- alb 2.1 Dys diet. vitamin  Shelle Iron, NP [place] [phone] 11/19/2014,10:37 AM  LOS: 6 days   Pt seen, examined and agree w A/P as above.  Kelly Splinter MD pager [phone]    cell [phone] 11/19/2014, 12:03 PM     Additional Objective Labs: Basic Metabolic Panel:  Recent Labs Lab 11/15/14 0614 11/17/14 0851 11/18/14 1240 11/19/14 0700  NA 137 135  137 136  K 3.7 4.7 5.0 4.5  CL 97 98 98 97  CO2 26 26 27 26   GLUCOSE 99 111* 73 113*  BUN 29* 25* 29* 42*  CREATININE 8.60* 7.74* 8.02* 10.02*  CALCIUM 9.0 9.2 9.3 8.8  PHOS 4.7*  --   --  6.0*   Liver Function Tests:  Recent Labs Lab 11/13/14 2110 11/15/14 0614 11/19/14 0700  AST 28  --   --   ALT 22  --   --   ALKPHOS 75  --   --   BILITOT 1.0  --   --   PROT 8.8*  --   --   ALBUMIN 3.0* 2.2* 2.1*   No results for input(s): LIPASE, AMYLASE in the last 168 hours. CBC:  Recent Labs Lab 11/13/14 2110 11/15/14 0614 11/17/14 0851 11/18/14 1240  WBC 14.6* 8.9 8.9 10.2  NEUTROABS 13.0*  --   --   --   HGB 10.4* 8.9* 9.1* 9.0*  HCT 32.0* 27.3* 28.1* 27.9*  MCV 84.4 83.5 83.4 84.0  PLT 367 354 370 412*   Blood Culture    Component Value Date/Time   SDES BLOOD LEFT HAND 11/13/2014 2303   SPECREQUEST BOTTLES DRAWN AEROBIC AND ANAEROBIC 5CC EA 11/13/2014 2303   CULT  11/13/2014 2303           BLOOD CULTURE  RECEIVED NO GROWTH TO DATE CULTURE WILL BE HELD FOR 5 DAYS BEFORE ISSUING A FINAL NEGATIVE REPORT Performed at [place] [facility] PENDING 11/13/2014 2303    Cardiac Enzymes:  Recent Labs Lab 11/14/14 2306 11/15/14 0614 11/17/14 0851 11/17/14 1952 11/18/14 0240  TROPONINI 0.03 0.03 0.04* 0.04* 0.03   CBG:  Recent Labs Lab 11/13/14 2001 11/14/14 0628 11/17/14 0810  GLUCAP 127* 107* 128*   Iron Studies: No results for input(s): IRON, TIBC, TRANSFERRIN, FERRITIN in the last 72 hours. @lablastinr3 @ Studies/Results: Mr Brain Wo Contrast  11/18/2014   CLINICAL DATA:  Encephalopathy and confusion.  Initial encounter.  EXAM: MRI HEAD WITHOUT CONTRAST  TECHNIQUE: Multiplanar, multiecho pulse sequences of the brain and surrounding structures were obtained without intravenous contrast.  COMPARISON:  CT head 11/17/2014.  FINDINGS: The patient was unable to remain motionless for the exam. Small or subtle lesions could be overlooked. Some series  could not be obtained due to premature truncation of exam.  No evidence for acute infarction, hemorrhage, mass lesion, hydrocephalus, or extra-axial fluid. Moderate cerebral and cerebellar atrophy with extensive chronic microvascular ischemic change throughout the periventricular and subcortical Siller matter. Moderate paranasal sinus disease affecting the maxillary and ethmoid regions. Posterior nasal cavity mass could represent antrochoanal polyp on the RIGHT. No midline abnormality is evident.  IMPRESSION: Prematurely truncated, and motion degraded exam, demonstrating no restricted diffusion to suggest acute infarction. Chronic changes as described.   Electronically Signed   By: Rolla Flatten M.D.   On: 11/18/2014 11:42   Medications:   . aspirin EC  81 mg Oral Daily  . azelastine  1 spray Each Nare Daily  . calcium acetate  667 mg Oral BID AC  . darbepoetin (ARANESP) injection - DIALYSIS  60 mcg Intravenous Q Sat-HD  . docusate sodium  100 mg Oral Daily  . gabapentin  300 mg Oral QHS  . heparin  5,000 Units Subcutaneous 3 times per day  . multivitamin  1 tablet Oral QHS  . pantoprazole  40 mg Oral Daily  . piperacillin-tazobactam (ZOSYN)  IV  2.25 g Intravenous 3 times per day  . polyethylene glycol  17 g Oral Daily  . simvastatin  5 mg Oral QHS  . vancomycin  1,000 mg Intravenous Q M,W,F-HD

## 2014-11-19 NOTE — Progress Notes (Signed)
Pt. Family member called regarding the pt's partial denture.  Informed the family member that I could find any dentures and there was not any logged in for her belongings (while she was on [place]).  Suggested that she have staff the unit that she is on, to report the missing item.  Jillyn Ledger, MBA, BS, RN

## 2014-11-19 NOTE — Progress Notes (Signed)
Transfer note:  Arrival Method: Bed from 3S Mental Orientation: A&oX3 Telemetry: Box 6E04 Assessment: see doc flowsheet Skin: Dry; intact IV: Left forearm Pain: Denies Tubes: N/A Safety Measures: Bed in lowest position; bed alarm activated.  6700 Orientation: Patient has been oriented to the unit, staff and to the room.

## 2014-11-20 ENCOUNTER — Inpatient Hospital Stay (HOSPITAL_COMMUNITY): Payer: Medicare Other

## 2014-11-20 LAB — CULTURE, BLOOD (ROUTINE X 2)
CULTURE: NO GROWTH
Culture: NO GROWTH

## 2014-11-20 MED ORDER — NEPRO/CARBSTEADY PO LIQD: 237.0000 mL | ORAL | Status: DC | PRN

## 2014-11-20 MED ORDER — LEVOFLOXACIN 250 MG PO TABS: 250.0000 mg | ORAL_TABLET | Freq: Every day | ORAL | Status: DC

## 2014-11-20 NOTE — Progress Notes (Signed)
Patient Name: Annette Roach Date of Encounter: 11/20/2014  Principal Problem:   HCAP (healthcare-associated pneumonia) Active Problems:   End stage renal disease on dialysis   Vasovagal syncope   Leukocytosis   Bradycardia   Altered mental status   Encephalopathy   Length of Stay: 7  SUBJECTIVE  The patient denies chest pain or dizziness. Tolerated HD well yesterday. No recurrent syncope.  CURRENT MEDS . aspirin EC  81 mg Oral Daily  . azelastine  1 spray Each Nare Daily  . calcium acetate  667 mg Oral BID AC  . darbepoetin (ARANESP) injection - DIALYSIS  60 mcg Intravenous Q Sat-HD  . docusate sodium  100 mg Oral Daily  . gabapentin  300 mg Oral QHS  . heparin  5,000 Units Subcutaneous 3 times per day  . multivitamin  1 tablet Oral QHS  . pantoprazole  40 mg Oral Daily  . piperacillin-tazobactam (ZOSYN)  IV  2.25 g Intravenous 3 times per day  . polyethylene glycol  17 g Oral Daily  . simvastatin  5 mg Oral QHS  . vancomycin  1,000 mg Intravenous Q M,W,F-HD    OBJECTIVE  Filed Vitals:   11/19/14 1119 11/19/14 1714 11/19/14 2032 11/20/14 0422  BP: 148/63 133/50 127/57 114/45  Pulse: 95 93 97 88  Temp: 98.1 F (36.7 C) 98.2 F (36.8 C) 97.8 F (36.6 C) 98.8 F (37.1 C)  TempSrc: Oral Oral Oral Oral  Resp: 20 18 20 18   Height:      Weight:   164 lb 12.8 oz (74.753 kg)   SpO2: 97% 99% 96% 99%    Intake/Output Summary (Last 24 hours) at 11/20/14 0808 Last data filed at 11/20/14 0600  Gross per 24 hour  Intake    330 ml  Output     11 ml  Net    319 ml   Filed Weights   11/17/14 0655 11/19/14 0700 11/19/14 2032  Weight: 152 lb 5.4 oz (69.1 kg) 156 lb 1.4 oz (70.8 kg) 164 lb 12.8 oz (74.753 kg)    PHYSICAL EXAM  General: alert, NAD Neuro: AAO x 1 Psych: normal HEENT:  Normal  Neck: Supple without bruits or JVD. Lungs:  Resp regular and unlabored, CTA. Heart: RRR no s3, s4, or murmurs. Abdomen: Soft, non-tender, non-distended, BS + x 4.    Extremities: No clubbing, cyanosis or edema. DP/PT/Radials 2+ and equal bilaterally.  Accessory Clinical Findings  CBC  Recent Labs  11/17/14 0851 11/18/14 1240  WBC 8.9 10.2  HGB 9.1* 9.0*  HCT 28.1* 27.9*  MCV 83.4 84.0  PLT 370 893*   Basic Metabolic Panel  Recent Labs  11/18/14 1240 11/19/14 0700  NA 137 136  K 5.0 4.5  CL 98 97  CO2 27 26  GLUCOSE 73 113*  BUN 29* 42*  CREATININE 8.02* 10.02*  CALCIUM 9.3 8.8  PHOS  --  6.0*   Liver Function Tests  Recent Labs  11/19/14 0700  ALBUMIN 2.1*  Cardiac Enzymes  Recent Labs  11/17/14 0851 11/17/14 1952 11/18/14 0240  TROPONINI 0.04* 0.04* 0.03   Radiology/Studies  Ct Head Wo Contrast  11/17/2014   CLINICAL DATA:  ENCEPHALOPATHY, CONFUSION, PT. MOANING DURING SCAN.  IMPRESSION: 1. No acute intracranial abnormalities. 2. Age related volume loss. Moderate chronic microvascular ischemic change. 3. Significant sinus disease. Probable right nasal spine is cavity polyp. 4. No significant change from the prior head CT.     Dg Chest [facility] 1 [address]  11/17/2014   CLINICAL DATA:  Vomiting.  EXAM: PORTABLE CHEST - 1 VIEW  COMPARISON:  11/13/2014  FINDINGS: Hazy airspace opacity projects in the left mid to lower lung zone obscuring the left hemidiaphragm. This is without significant change. Remainder of the lungs is clear. No evidence of pulmonary edema. No pneumothorax.  Enlargement of the cardiopericardial silhouette is stable. No mediastinal or hilar masses.  Bony thorax demineralized but grossly intact.  IMPRESSION: 1. Cardiomegaly, unchanged from the recent study. 2. Persistent left lower lung zone airspace opacity likely combination of infiltrate or atelectasis and a small effusion. This is also stable. No new abnormalities.    TELE: SR, no pauses or bradycardia    ASSESSMENT AND PLAN  1. Syncope- vasovagal.  2. PNA: on abx 3. HTN 4. HLD 5. DM 6. COPD 7. CAD: no prior record, unknown history.  8. ESRD on HD  T/Th/Sat  Pleasant 81 year old African-American female with past medical history of HTN, HLD, DM, COPD, history of angina/CAD, ESRD on HD T/Th/Sat present with LOC while straining for BM and diagnosed with PNA as well. Cardiology consulted for syncope and abnormal echo result. The syncope was considered to be typical vasovagal. She had no chest pain and no SOB. Ecg shows no change in RBBB, LAFB, and marked LVH. By Echo she does have mild LV dysfunction with inferior wall motion abnormality. This is suggestive of underlying CAD and this would not be surprising at her age with ESRD, HTN, DM, and HL.   With ESRD on HD and ongoing infection - pneumonia she is a poor candidate for permanent pacemaker placement. From a cardiac standpoint she is stable for DC. She was followed as an outpatient with Dr. Agustin Cree at [facility] cardiology and will need to follow up with him as an outpatient. Please call with further questions- I will sign off.  Signed, Peter Martinique MD, [facility] 11/20/2014

## 2014-11-20 NOTE — Progress Notes (Signed)
OT Note  Patient Details Name: Annette Roach MRN: [id number] DOB: 04/05/1928   Cancelled Treatment:    Reason Eval/Treat Not Completed: Other (The discharge plan is SNF. All OT needs can be met in the next venue of care -- defer to OT at SNF.)  ,  A 11/20/2014, 10:32 AM 

## 2014-11-20 NOTE — Discharge Summary (Signed)
Physician Discharge Summary  Annette Roach:[id number] DOB: 05-Apr-1928 DOA: 11/13/2014  PCP: Gilford Rile, MD  Admit date: 11/13/2014 Discharge date: 11/20/2014  Time spent: 35 minutes  Recommendations for Outpatient Follow-up:  Follow up with primary oncologist. Follow up with dialysis unit.   Discharge Diagnoses:    HCAP (healthcare-associated pneumonia)   End stage renal disease on dialysis   Vasovagal syncope   Encephalopathy   Leukocytosis   Bradycardia   Altered mental status   Encephalopathy   Discharge Condition: Stable.   Diet recommendation: Renal diet  Filed Weights   11/17/14 0655 11/19/14 0700 11/19/14 2032  Weight: 69.1 kg (152 lb 5.4 oz) 70.8 kg (156 lb 1.4 oz) 74.753 kg (164 lb 12.8 oz)    History of present illness:  Annette Roach is a 81 y.o. female who presents to the ED with rectal pain and syncope. Patient developed severe constipation earlier today, was straining to have a BM and had LOC while on the toilet. Thankfully family were able to catch her before she fell so is uninjured.  In addition to a rectal fissure identified by the EDP, patient also has had 2 week history of non-productive cough that has been persistent.  Hospital Course:  1-Syncope:  ECHO with Ef 45 % , Right BBB. Cardiology consulted.  Patient during dialysis 3-19 became hypotensive SBP in the 50, HR in the 30. Patient was unresponsive.  Received IV bolus, vitals at this time stable.  No candidate for pacemaker per cardiology.  Cycled Enzymes mildly elevation.   2-HCAP;  Received vancomycin and zosyn for 2 days. Discharge on levaquin for 2 more days.  WBC trending down.  -Vomiting episodes, concern for aspiration.  -Repeated Chest x ray with persistent left lower zone opacity.   3-Encephalopathy:  Patient became confuse, mute, after episode of bradycardia, hypotension during dialysis on 3-19. She is now back to baseline.  Per daughter patient gets some time like  that but was worse here in the hospital. MRI negative, EEG pending. If EEG negative plan to discharge home.  CT head no acute intracranial abnormalities.  Neurology consulted.  TSH 1.9, Ammonia 32. EEG pending. MRI negative for stroke.   Anemia of chronic diseases, renal failure. . Hb at 8.9. Hb at 9.8 last month.   ESRD; nephrologist Following.   History stomach cancer; has appointment with oncologist,.   Constipation: Miralax, sorbitol. Had BM.    Procedures:  Cefepime 3-15---3-19  Zosyn 3-19  Vancomycin 3-15  Consultations:  nephrologist   Cardiology  neurologist  Discharge Exam: Filed Vitals:   11/20/14 0809  BP: 121/64  Pulse: 87  Temp: 98.2 F (36.8 C)  Resp: 17    General: Alert, in no distress.  Cardiovascular: S 1, S 2 RRR Respiratory: CTA  Discharge Instructions    Current Discharge Medication List    START taking these medications   Details  levofloxacin (LEVAQUIN) 250 MG tablet Take 1 tablet (250 mg total) by mouth daily. Qty: 2 tablet, Refills: 0    Nutritional Supplements (FEEDING SUPPLEMENT, NEPRO CARB STEADY,) LIQD Take 237 mLs by mouth as needed (missed meal during dialysis.). Qty: 30 Can, Refills: 0      CONTINUE these medications which have NOT CHANGED   Details  albuterol (PROVENTIL) (2.5 MG/3ML) 0.083% nebulizer solution Take 2.5 mg by nebulization every 6 (six) hours as needed for wheezing or shortness of breath.    aspirin EC 81 MG tablet Take 81 mg by mouth daily.    Azelastine  HCl 0.15 % SOLN Place 1 spray into both nostrils daily.  Refills: 3    calcium acetate (PHOSLO) 667 MG capsule Take 667 mg by mouth 2 (two) times daily before a meal.     Docusate Calcium (STOOL SOFTENER PO) Take 1 tablet by mouth as needed (for constipation).    gabapentin (NEURONTIN) 300 MG capsule Take 300 mg by mouth at bedtime.    HYDROcodone-acetaminophen (NORCO) 10-325 MG per tablet Take 1 tablet by mouth every 6 (six) hours as  needed for severe pain. Qty: 15 tablet, Refills: 0    multivitamin (RENA-VIT) TABS tablet Take 1 tablet by mouth daily.    nitroGLYCERIN (NITROSTAT) 0.4 MG SL tablet Place 0.4 mg under the tongue every 5 (five) minutes as needed for chest pain.    pantoprazole (PROTONIX) 40 MG tablet Take 40 mg by mouth daily.    polyethylene glycol (MIRALAX / GLYCOLAX) packet Take 17 g by mouth daily.    simvastatin (ZOCOR) 5 MG tablet Take 5 mg by mouth at bedtime.     traMADol (ULTRAM) 50 MG tablet Take 50 mg by mouth every 6 (six) hours as needed. for pain Refills: 0      STOP taking these medications     hydrALAZINE (APRESOLINE) 100 MG tablet        No Known Allergies Follow-up Information    Follow up with Gilford Rile, MD In 1 week.   Specialty:  Internal Medicine   Contact information:   [address] [place] [id number] [phone]        The results of significant diagnostics from this hospitalization (including imaging, microbiology, ancillary and laboratory) are listed below for reference.    Significant Diagnostic Studies: Dg Chest 2 View  11/13/2014   CLINICAL DATA:  Cough for 2 days.  EXAM: CHEST  2 VIEW  COMPARISON:  10/27/2014  FINDINGS: There is consolidation in the left base, possibly a small left effusion. The right lung is clear. There is moderate cardiomegaly which appears worsened.  IMPRESSION: Increase size of the cardiac silhouette compared to 10/27/2014. This is indeterminate with regard to pericardial effusion versus cardiac enlargement.  Consolidation and possible effusion in the left base.   Electronically Signed   By: Andreas Newport M.D.   On: 11/13/2014 21:00   Ct Head Wo Contrast  11/17/2014   CLINICAL DATA:  ENCEPHALOPATHY, CONFUSION, PT. MOANING DURING SCAN,  EXAM: CT HEAD WITHOUT CONTRAST  TECHNIQUE: Contiguous axial images were obtained from the base of the skull through the vertex without intravenous contrast.  COMPARISON:  10/26/2014  FINDINGS:  Ventricles are normal in size, for this patient's age, and normal in configuration.  There are no parenchymal masses or mass effect. Patchy Salome matter hypoattenuation is noted consistent with moderate chronic microvascular ischemic change.  No evidence of a cortical infarct.  There are no extra-axial masses or abnormal fluid collections.  There is no intracranial hemorrhage  Right maxillary sinus is mostly opacified by a combination of dependent fluid and mucosal thickening. There is moderate left maxillary sinus mucosal thickening. Significant mucosal thickening is noted along the middle and anterior right ethmoid air cells extending to the right frontal sinus which is occluded. Minor dependent secretions noted in the sphenoid sinuses. Clear left ethmoid air cells and left frontal sinus.  Focal soft tissue opacity in the right nasal cavity near the ostiomeatal complex on the right suggests a polyp.  Small frontal calvarial lucent lesion noted on prior study is stable.  IMPRESSION:  1. No acute intracranial abnormalities. 2. Age related volume loss. Moderate chronic microvascular ischemic change. 3. Significant sinus disease. Probable right nasal spine is cavity polyp. 4. No significant change from the prior head CT.   Electronically Signed   By: Lajean Manes M.D.   On: 11/17/2014 10:38   Mr Brain Wo Contrast  11/18/2014   CLINICAL DATA:  Encephalopathy and confusion.  Initial encounter.  EXAM: MRI HEAD WITHOUT CONTRAST  TECHNIQUE: Multiplanar, multiecho pulse sequences of the brain and surrounding structures were obtained without intravenous contrast.  COMPARISON:  CT head 11/17/2014.  FINDINGS: The patient was unable to remain motionless for the exam. Small or subtle lesions could be overlooked. Some series could not be obtained due to premature truncation of exam.  No evidence for acute infarction, hemorrhage, mass lesion, hydrocephalus, or extra-axial fluid. Moderate cerebral and cerebellar atrophy with  extensive chronic microvascular ischemic change throughout the periventricular and subcortical Armato matter. Moderate paranasal sinus disease affecting the maxillary and ethmoid regions. Posterior nasal cavity mass could represent antrochoanal polyp on the RIGHT. No midline abnormality is evident.  IMPRESSION: Prematurely truncated, and motion degraded exam, demonstrating no restricted diffusion to suggest acute infarction. Chronic changes as described.   Electronically Signed   By: Rolla Flatten M.D.   On: 11/18/2014 11:42   Dg Chest Port 1 View  11/17/2014   CLINICAL DATA:  Vomiting.  EXAM: PORTABLE CHEST - 1 VIEW  COMPARISON:  11/13/2014  FINDINGS: Hazy airspace opacity projects in the left mid to lower lung zone obscuring the left hemidiaphragm. This is without significant change. Remainder of the lungs is clear. No evidence of pulmonary edema. No pneumothorax.  Enlargement of the cardiopericardial silhouette is stable. No mediastinal or hilar masses.  Bony thorax demineralized but grossly intact.  IMPRESSION: 1. Cardiomegaly, unchanged from the recent study. 2. Persistent left lower lung zone airspace opacity likely combination of infiltrate or atelectasis and a small effusion. This is also stable. No new abnormalities.   Electronically Signed   By: Lajean Manes M.D.   On: 11/17/2014 09:50   Dg Chest Port 1 View  10/27/2014   CLINICAL DATA:  Status post central line placement  EXAM: PORTABLE CHEST - 1 VIEW  COMPARISON:  10/26/2014  FINDINGS: Cardiac shadow is mildly enlarged but stable. A left jugular central venous line is again seen with the catheter tip in the proximal superior vena cava. No pneumothorax is noted. Patchy infiltrative changes are noted in the left base slightly increased from the prior exam.  IMPRESSION: No pneumothorax.  Catheter placement as described.  Increasing left basilar infiltrate.   Electronically Signed   By: Inez Catalina M.D.   On: 10/27/2014 08:20     Microbiology: Recent Results (from the past 240 hour(s))  Blood culture (routine x 2)     Status: None   Collection Time: 11/13/14 10:56 PM  Result Value Ref Range Status   Specimen Description BLOOD LEFT ANTECUBITAL  Final   Special Requests BOTTLES DRAWN AEROBIC AND ANAEROBIC 10CC EA  Final   Culture   Final    NO GROWTH 5 DAYS Note: Culture results may be compromised due to an excessive volume of blood received in culture bottles. Performed at [facility]    Report Status 11/20/2014 FINAL  Final  Blood culture (routine x 2)     Status: None   Collection Time: 11/13/14 11:03 PM  Result Value Ref Range Status   Specimen Description BLOOD LEFT HAND  Final  Special Requests BOTTLES DRAWN AEROBIC AND ANAEROBIC 5CC EA  Final   Culture   Final    NO GROWTH 5 DAYS Performed at [facility]    Report Status 11/20/2014 FINAL  Final  MRSA PCR Screening     Status: None   Collection Time: 11/14/14 12:33 AM  Result Value Ref Range Status   MRSA by PCR NEGATIVE NEGATIVE Final    Comment:        The GeneXpert MRSA Assay (FDA approved for NASAL specimens only), is one component of a comprehensive MRSA colonization surveillance program. It is not intended to diagnose MRSA infection nor to guide or monitor treatment for MRSA infections.   MRSA PCR Screening     Status: None   Collection Time: 11/17/14  9:39 PM  Result Value Ref Range Status   MRSA by PCR NEGATIVE NEGATIVE Final    Comment:        The GeneXpert MRSA Assay (FDA approved for NASAL specimens only), is one component of a comprehensive MRSA colonization surveillance program. It is not intended to diagnose MRSA infection nor to guide or monitor treatment for MRSA infections.      Labs: Basic Metabolic Panel:  Recent Labs Lab 11/13/14 2110 11/15/14 0614 11/17/14 0851 11/18/14 1240 11/19/14 0700  NA 137 137 135 137 136  K 3.7 3.7 4.7 5.0 4.5  CL 94* 97 98 98 97  CO2 34* 26 26 27 26    GLUCOSE 161* 99 111* 73 113*  BUN 14 29* 25* 29* 42*  CREATININE 5.82* 8.60* 7.74* 8.02* 10.02*  CALCIUM 9.2 9.0 9.2 9.3 8.8  PHOS  --  4.7*  --   --  6.0*   Liver Function Tests:  Recent Labs Lab 11/13/14 2110 11/15/14 0614 11/19/14 0700  AST 28  --   --   ALT 22  --   --   ALKPHOS 75  --   --   BILITOT 1.0  --   --   PROT 8.8*  --   --   ALBUMIN 3.0* 2.2* 2.1*   No results for input(s): LIPASE, AMYLASE in the last 168 hours.  Recent Labs Lab 11/18/14 0804  AMMONIA 32   CBC:  Recent Labs Lab 11/13/14 2110 11/15/14 0614 11/17/14 0851 11/18/14 1240  WBC 14.6* 8.9 8.9 10.2  NEUTROABS 13.0*  --   --   --   HGB 10.4* 8.9* 9.1* 9.0*  HCT 32.0* 27.3* 28.1* 27.9*  MCV 84.4 83.5 83.4 84.0  PLT 367 354 370 412*   Cardiac Enzymes:  Recent Labs Lab 11/14/14 2306 11/15/14 0614 11/17/14 0851 11/17/14 1952 11/18/14 0240  TROPONINI 0.03 0.03 0.04* 0.04* 0.03   BNP: BNP (last 3 results) No results for input(s): BNP in the last 8760 hours.  ProBNP (last 3 results) No results for input(s): PROBNP in the last 8760 hours.  CBG:  Recent Labs Lab 11/13/14 2001 11/14/14 0628 11/17/14 0810  GLUCAP 127* 107* 128*       Signed:  Niel Hummer A  [facility] 11/20/2014, 11:48 AM

## 2014-11-20 NOTE — Progress Notes (Signed)
Report called to [facility] in [place] to [place].

## 2014-11-20 NOTE — Progress Notes (Signed)
Speech Language Pathology Treatment: Dysphagia  Patient Details Name: LANELL CARPENTER MRN: [id number] DOB: 04-24-1928 Today's Date: 11/20/2014 Time: 7829-5621 SLP Time Calculation (min) (ACUTE ONLY): 12 min  Assessment / Plan / Recommendation Clinical Impression  Pt more alert today as compared to previous SLP visits. She self-fed trials of regular textures and thin liquids with no overt signs of aspiration or dysphagia. Oropharyngeal swallow appears within gross functional limits given missing dentition. Will advance diet to regular textures and thin liquids - pt may benefit from brief f/u at SNF for diet tolerance.    HPI HPI: MYA SUELL is a 81 y.o. female with past medical history of congestive heart failure, hypertension, GERD, hyperlipidemia, asthma, COPD, diabetes mellitus, end-stage renal disease on dialysis (Tuesday/Thursday/Saturday), atrial fibrillation (only on aspirin), who presents with the abdominal pain and chest pain. Patient is transferred from [facility] per Dr. Wilder Glade. Patient presented to the [facility], with initial presentation of chest pain and altered mental status. Per Dr. Wilder Glade, she has intermittent mild chest pain in the past month. She seems to have altered mental status and confusion recently. She was found to have fever of 102.2, and shortness of breath. They did ABG which showed pH 7.49, PCO2 47, PO2 65%. Patient did not have cough. Patient was suspected to have severe sepsis in [facility]. Patient also complains of abdominal pain which also resolved currently.  Of note, she reports that she was diagnosed as GIST (GI stromal tumor?) at about 3 weeks ago. No surgery was done yet (no document available now).  Currently, patient denies fever, chills, headaches, cough, chest pain, SOB, abdominal pain, diarrhea, constipation, dysuria, urgency, frequency, hematuria, skin rashes, joint pain or leg swelling. No unilateral weakness, numbness or tingling  sensations. No vision change or hearing loss.In [facility], CT-head was negative for acute abnormalities. Chest x-ray was negative for infiltration, but showed mild pulmonary edema. CT angiogram was negative for infiltration and pulmonary embolism.    Pertinent Vitals Pain Assessment: No/denies pain  SLP Plan  Continue with current plan of care    Recommendations Diet recommendations: Regular;Thin liquid Liquids provided via: Straw;Cup Medication Administration: Whole meds with puree Supervision: Full supervision/cueing for compensatory strategies Compensations: Slow rate;Small sips/bites Postural Changes and/or Swallow Maneuvers: Seated upright 90 degrees       Oral Care Recommendations: Oral care BID Follow up Recommendations: Skilled Nursing facility (likely very brief f/u for tolerance) Plan: Continue with current plan of care    Germain Osgood, M.A. CCC-SLP [phone]  Germain Osgood 11/20/2014, 2:49 PM

## 2014-11-20 NOTE — Progress Notes (Signed)
PT Cancellation Note  Patient Details Name: [name] MRN: [id number] DOB: [birth year]   Cancelled Treatment:    Reason Eval/Treat Not Completed: Patient at procedure or test/unavailable. Pt at EEG will re-attempt to see at next available time.    Elie Confer Henry, Caroleen 11/20/2014, 10:29 AM

## 2014-11-20 NOTE — Progress Notes (Signed)
EEG Completed; Results Pending  

## 2014-11-20 NOTE — Progress Notes (Signed)
Chaplain responded to page fro family needing support. Family and pt wanted to complete advanced directive. Chaplain coordinated to have advanced directive completed. Pt and family received copies and original.  Annette Roach 11/20/2014 7:27 PM

## 2014-11-20 NOTE — Procedures (Signed)
EEG report.  Brief clinical history: Annette Roach is a 81 y.o. female who presents to the ED with rectal pain and syncope.   Technique: this is a 17 channel routine scalp EEG performed at the bedside with bipolar and monopolar montages arranged in accordance to the international 10/20 system of electrode placement. One channel was dedicated to EKG recording.  The study was performed during wakefulness, drowsiness, and stage 2 sleep. No activating procedures performed.  Description:In the wakeful state, the best background consisted of a medium amplitude, posterior dominant, well sustained, symmetric and reactive 10 Hz rhythm. Drowsiness demonstrated dropout of the alpha rhythm. Stage 2 sleep showed symmetric and synchronous sleep spindles without intermixed epileptiform discharges. No focal or generalized epileptiform discharges noted.  No pathologic areas of slowing seen.  EKG showed sinus rhythm.  Impression: this is a normal awake and asleep EEG. Please, be aware that a normal EEG does not exclude the possibility of epilepsy.  Clinical correlation is advised.   Dorian Pod, MD

## 2014-11-20 NOTE — Progress Notes (Signed)
Pt left floor by [facility] via ambulance to [facility].

## 2014-12-25 ENCOUNTER — Emergency Department (HOSPITAL_COMMUNITY): Payer: Medicare Other

## 2014-12-25 ENCOUNTER — Inpatient Hospital Stay (HOSPITAL_COMMUNITY)
Admission: EM | Admit: 2014-12-25 | Discharge: 2014-12-29 | DRG: 871 | Disposition: A | Discharge status: Home / Self Care | Payer: Medicare Other | Attending: Internal Medicine | Admitting: Internal Medicine

## 2014-12-25 ENCOUNTER — Encounter (HOSPITAL_COMMUNITY): Payer: Self-pay | Admitting: General Practice

## 2014-12-25 DIAGNOSIS — Z85028 Personal history of other malignant neoplasm of stomach: Secondary | ICD-10-CM

## 2014-12-25 DIAGNOSIS — E213 Hyperparathyroidism, unspecified: Secondary | ICD-10-CM | POA: Diagnosis not present

## 2014-12-25 DIAGNOSIS — I120 Hypertensive chronic kidney disease with stage 5 chronic kidney disease or end stage renal disease: Secondary | ICD-10-CM | POA: Diagnosis present

## 2014-12-25 DIAGNOSIS — C169 Malignant neoplasm of stomach, unspecified: Secondary | ICD-10-CM | POA: Diagnosis not present

## 2014-12-25 DIAGNOSIS — G459 Transient cerebral ischemic attack, unspecified: Secondary | ICD-10-CM | POA: Diagnosis present

## 2014-12-25 DIAGNOSIS — R4781 Slurred speech: Secondary | ICD-10-CM | POA: Diagnosis not present

## 2014-12-25 DIAGNOSIS — Z87442 Personal history of urinary calculi: Secondary | ICD-10-CM | POA: Diagnosis not present

## 2014-12-25 DIAGNOSIS — G9340 Encephalopathy, unspecified: Secondary | ICD-10-CM | POA: Diagnosis not present

## 2014-12-25 DIAGNOSIS — J45909 Unspecified asthma, uncomplicated: Secondary | ICD-10-CM | POA: Diagnosis present

## 2014-12-25 DIAGNOSIS — R531 Weakness: Secondary | ICD-10-CM | POA: Diagnosis present

## 2014-12-25 DIAGNOSIS — F329 Major depressive disorder, single episode, unspecified: Secondary | ICD-10-CM | POA: Diagnosis not present

## 2014-12-25 DIAGNOSIS — J189 Pneumonia, unspecified organism: Secondary | ICD-10-CM | POA: Diagnosis present

## 2014-12-25 DIAGNOSIS — I10 Essential (primary) hypertension: Secondary | ICD-10-CM | POA: Diagnosis present

## 2014-12-25 DIAGNOSIS — I2510 Atherosclerotic heart disease of native coronary artery without angina pectoris: Secondary | ICD-10-CM | POA: Diagnosis present

## 2014-12-25 DIAGNOSIS — G629 Polyneuropathy, unspecified: Secondary | ICD-10-CM | POA: Diagnosis present

## 2014-12-25 DIAGNOSIS — G92 Toxic encephalopathy: Secondary | ICD-10-CM | POA: Diagnosis not present

## 2014-12-25 DIAGNOSIS — Z992 Dependence on renal dialysis: Secondary | ICD-10-CM | POA: Diagnosis not present

## 2014-12-25 DIAGNOSIS — M6289 Other specified disorders of muscle: Secondary | ICD-10-CM

## 2014-12-25 DIAGNOSIS — T829XXA Unspecified complication of cardiac and vascular prosthetic device, implant and graft, initial encounter: Secondary | ICD-10-CM

## 2014-12-25 DIAGNOSIS — K219 Gastro-esophageal reflux disease without esophagitis: Secondary | ICD-10-CM | POA: Diagnosis not present

## 2014-12-25 DIAGNOSIS — Z90710 Acquired absence of both cervix and uterus: Secondary | ICD-10-CM

## 2014-12-25 DIAGNOSIS — T82898A Other specified complication of vascular prosthetic devices, implants and grafts, initial encounter: Secondary | ICD-10-CM

## 2014-12-25 DIAGNOSIS — A419 Sepsis, unspecified organism: Principal | ICD-10-CM | POA: Diagnosis present

## 2014-12-25 DIAGNOSIS — Z9049 Acquired absence of other specified parts of digestive tract: Secondary | ICD-10-CM | POA: Diagnosis not present

## 2014-12-25 DIAGNOSIS — M1990 Unspecified osteoarthritis, unspecified site: Secondary | ICD-10-CM | POA: Diagnosis present

## 2014-12-25 DIAGNOSIS — N186 End stage renal disease: Secondary | ICD-10-CM | POA: Diagnosis present

## 2014-12-25 DIAGNOSIS — J449 Chronic obstructive pulmonary disease, unspecified: Secondary | ICD-10-CM | POA: Diagnosis present

## 2014-12-25 DIAGNOSIS — E119 Type 2 diabetes mellitus without complications: Secondary | ICD-10-CM | POA: Diagnosis present

## 2014-12-25 DIAGNOSIS — D631 Anemia in chronic kidney disease: Secondary | ICD-10-CM | POA: Diagnosis not present

## 2014-12-25 DIAGNOSIS — H269 Unspecified cataract: Secondary | ICD-10-CM | POA: Diagnosis not present

## 2014-12-25 DIAGNOSIS — E785 Hyperlipidemia, unspecified: Secondary | ICD-10-CM | POA: Diagnosis present

## 2014-12-25 DIAGNOSIS — J90 Pleural effusion, not elsewhere classified: Secondary | ICD-10-CM | POA: Diagnosis present

## 2014-12-25 DIAGNOSIS — J910 Malignant pleural effusion: Secondary | ICD-10-CM | POA: Diagnosis not present

## 2014-12-25 DIAGNOSIS — Z7982 Long term (current) use of aspirin: Secondary | ICD-10-CM | POA: Diagnosis not present

## 2014-12-25 DIAGNOSIS — N189 Chronic kidney disease, unspecified: Secondary | ICD-10-CM

## 2014-12-25 DIAGNOSIS — F0390 Unspecified dementia without behavioral disturbance: Secondary | ICD-10-CM | POA: Diagnosis present

## 2014-12-25 LAB — DIFFERENTIAL
Basophils Absolute: 0 10*3/uL (ref 0.0–0.1)
Basophils Relative: 0 % (ref 0–1)
Eosinophils Absolute: 0.1 10*3/uL (ref 0.0–0.7)
Eosinophils Relative: 1 % (ref 0–5)
Lymphocytes Relative: 13 % (ref 12–46)
Lymphs Abs: 1.7 10*3/uL (ref 0.7–4.0)
MONO ABS: 0.9 10*3/uL (ref 0.1–1.0)
MONOS PCT: 7 % (ref 3–12)
NEUTROS PCT: 79 % — AB (ref 43–77)
Neutro Abs: 9.8 10*3/uL — ABNORMAL HIGH (ref 1.7–7.7)

## 2014-12-25 LAB — CBC
HEMATOCRIT: 30.9 % — AB (ref 36.0–46.0)
HEMOGLOBIN: 9.5 g/dL — AB (ref 12.0–15.0)
MCH: 26.5 pg (ref 26.0–34.0)
MCHC: 30.7 g/dL (ref 30.0–36.0)
MCV: 86.3 fL (ref 78.0–100.0)
PLATELETS: 378 10*3/uL (ref 150–400)
RBC: 3.58 MIL/uL — ABNORMAL LOW (ref 3.87–5.11)
RDW: 20.1 % — ABNORMAL HIGH (ref 11.5–15.5)
WBC: 12.6 10*3/uL — ABNORMAL HIGH (ref 4.0–10.5)

## 2014-12-25 LAB — COMPREHENSIVE METABOLIC PANEL
ALBUMIN: 2.7 g/dL — AB (ref 3.5–5.2)
ALT: 8 U/L (ref 0–35)
AST: 16 U/L (ref 0–37)
Alkaline Phosphatase: 97 U/L (ref 39–117)
Anion gap: 14 (ref 5–15)
BUN: 20 mg/dL (ref 6–23)
CHLORIDE: 93 mmol/L — AB (ref 96–112)
CO2: 30 mmol/L (ref 19–32)
Calcium: 9 mg/dL (ref 8.4–10.5)
Creatinine, Ser: 9.32 mg/dL — ABNORMAL HIGH (ref 0.50–1.10)
GFR calc non Af Amer: 3 mL/min — ABNORMAL LOW (ref >90)
GFR, EST AFRICAN AMERICAN: 4 mL/min — AB (ref >90)
Glucose, Bld: 117 mg/dL — ABNORMAL HIGH (ref 70–99)
Potassium: 4.5 mmol/L (ref 3.5–5.1)
Sodium: 137 mmol/L (ref 135–145)
Total Bilirubin: 0.7 mg/dL (ref 0.3–1.2)
Total Protein: 8.3 g/dL (ref 6.0–8.3)

## 2014-12-25 LAB — I-STAT CHEM 8, ED
BUN: 25 mg/dL — ABNORMAL HIGH (ref 6–23)
CALCIUM ION: 1.03 mmol/L — AB (ref 1.13–1.30)
CHLORIDE: 94 mmol/L — AB (ref 96–112)
CREATININE: 9 mg/dL — AB (ref 0.50–1.10)
GLUCOSE: 121 mg/dL — AB (ref 70–99)
HCT: 35 % — ABNORMAL LOW (ref 36.0–46.0)
Hemoglobin: 11.9 g/dL — ABNORMAL LOW (ref 12.0–15.0)
Potassium: 4.6 mmol/L (ref 3.5–5.1)
Sodium: 137 mmol/L (ref 135–145)
TCO2: 30 mmol/L (ref 0–100)

## 2014-12-25 LAB — RAPID URINE DRUG SCREEN, HOSP PERFORMED
AMPHETAMINES: NOT DETECTED
BENZODIAZEPINES: NOT DETECTED
Barbiturates: NOT DETECTED
Cocaine: NOT DETECTED
OPIATES: NOT DETECTED
TETRAHYDROCANNABINOL: NOT DETECTED

## 2014-12-25 LAB — URINALYSIS, ROUTINE W REFLEX MICROSCOPIC
BILIRUBIN URINE: NEGATIVE
Glucose, UA: NEGATIVE mg/dL
Ketones, ur: NEGATIVE mg/dL
Nitrite: NEGATIVE
PH: 8 (ref 5.0–8.0)
Protein, ur: 100 mg/dL — AB
Specific Gravity, Urine: 1.009 (ref 1.005–1.030)
UROBILINOGEN UA: 0.2 mg/dL (ref 0.0–1.0)

## 2014-12-25 LAB — URINE MICROSCOPIC-ADD ON

## 2014-12-25 LAB — PROTIME-INR
INR: 1.23 (ref 0.00–1.49)
PROTHROMBIN TIME: 15.6 s — AB (ref 11.6–15.2)

## 2014-12-25 LAB — I-STAT CG4 LACTIC ACID, ED
Lactic Acid, Venous: 1.19 mmol/L (ref 0.5–2.0)
Lactic Acid, Venous: 0.86 mmol/L (ref 0.5–2.0)

## 2014-12-25 LAB — I-STAT TROPONIN, ED: Troponin i, poc: 0.04 ng/mL (ref 0.00–0.08)

## 2014-12-25 LAB — MRSA PCR SCREENING: MRSA BY PCR: NEGATIVE

## 2014-12-25 LAB — APTT: aPTT: 32 seconds (ref 24–37)

## 2014-12-25 LAB — ETHANOL: Alcohol, Ethyl (B): <5 mg/dL (ref 0–9)

## 2014-12-25 MED ORDER — ASPIRIN EC 81 MG PO TBEC
81.0000 mg | DELAYED_RELEASE_TABLET | Freq: Every day | ORAL | Status: DC
  Administered 2014-12-26 – 2014-12-29 (×4): 81 mg via ORAL
  Filled 2014-12-25 (×5): qty 1

## 2014-12-25 MED ORDER — PENTAFLUOROPROP-TETRAFLUOROETH EX AERO: 1.0000 "application " | INHALATION_SPRAY | CUTANEOUS | Status: DC | PRN

## 2014-12-25 MED ORDER — BECLOMETHASONE DIPROPIONATE 80 MCG/ACT IN AERS: 2.0000 | INHALATION_SPRAY | Freq: Two times a day (BID) | RESPIRATORY_TRACT | Status: DC

## 2014-12-25 MED ORDER — ONDANSETRON HCL 4 MG/2ML IJ SOLN: 4.0000 mg | Freq: Four times a day (QID) | INTRAMUSCULAR | Status: DC | PRN

## 2014-12-25 MED ORDER — DEXTROSE 5 % IV SOLN: 1.0000 g | Freq: Three times a day (TID) | INTRAVENOUS | Status: DC

## 2014-12-25 MED ORDER — RENA-VITE PO TABS
1.0000 | ORAL_TABLET | Freq: Every day | ORAL | Status: DC
  Administered 2014-12-26 – 2014-12-29 (×4): 1 via ORAL
  Filled 2014-12-25 (×5): qty 1

## 2014-12-25 MED ORDER — HEPARIN SODIUM (PORCINE) 1000 UNIT/ML DIALYSIS
5000.0000 [IU] | INTRAMUSCULAR | Status: DC | PRN
Start: 2014-12-25 — End: 2014-12-26

## 2014-12-25 MED ORDER — BUDESONIDE 0.5 MG/2ML IN SUSP
0.5000 mg | Freq: Two times a day (BID) | RESPIRATORY_TRACT | Status: DC
  Administered 2014-12-25 – 2014-12-27 (×5): 0.5 mg via RESPIRATORY_TRACT
  Filled 2014-12-25 (×11): qty 2

## 2014-12-25 MED ORDER — PIPERACILLIN-TAZOBACTAM IN DEX 2-0.25 GM/50ML IV SOLN
2.2500 g | Freq: Three times a day (TID) | INTRAVENOUS | Status: DC
  Administered 2014-12-25: 2.25 g via INTRAVENOUS
  Filled 2014-12-25 (×2): qty 50

## 2014-12-25 MED ORDER — PANTOPRAZOLE SODIUM 40 MG PO TBEC
40.0000 mg | DELAYED_RELEASE_TABLET | Freq: Every day | ORAL | Status: DC
  Administered 2014-12-26 – 2014-12-29 (×4): 40 mg via ORAL
  Filled 2014-12-25 (×4): qty 1

## 2014-12-25 MED ORDER — MORPHINE SULFATE 2 MG/ML IJ SOLN: 1.0000 mg | INTRAMUSCULAR | Status: DC | PRN

## 2014-12-25 MED ORDER — HEPARIN SODIUM (PORCINE) 1000 UNIT/ML DIALYSIS
1000.0000 [IU] | INTRAMUSCULAR | Status: DC | PRN
Start: 2014-12-25 — End: 2014-12-26
  Filled 2014-12-25: qty 1

## 2014-12-25 MED ORDER — VANCOMYCIN HCL IN DEXTROSE 750-5 MG/150ML-% IV SOLN
750.0000 mg | INTRAVENOUS | Status: DC
  Administered 2014-12-25 – 2014-12-27 (×2): 750 mg via INTRAVENOUS
  Filled 2014-12-25 (×3): qty 150

## 2014-12-25 MED ORDER — SIMVASTATIN 5 MG PO TABS
5.0000 mg | ORAL_TABLET | Freq: Every day | ORAL | Status: DC
  Administered 2014-12-26 (×2): 5 mg via ORAL
  Filled 2014-12-25 (×6): qty 1

## 2014-12-25 MED ORDER — SODIUM CHLORIDE 0.9 % IV SOLN
INTRAVENOUS | Status: DC
  Administered 2014-12-25: 21:00:00 via INTRAVENOUS

## 2014-12-25 MED ORDER — AMLODIPINE BESYLATE 2.5 MG PO TABS
1.2500 mg | ORAL_TABLET | Freq: Every day | ORAL | Status: DC
  Administered 2014-12-26: 1.25 mg via ORAL
  Filled 2014-12-25 (×2): qty 0.5

## 2014-12-25 MED ORDER — HEPARIN SODIUM (PORCINE) 5000 UNIT/ML IJ SOLN
5000.0000 [IU] | Freq: Three times a day (TID) | INTRAMUSCULAR | Status: DC
  Administered 2014-12-26 – 2014-12-29 (×9): 5000 [IU] via SUBCUTANEOUS
  Filled 2014-12-25 (×12): qty 1

## 2014-12-25 MED ORDER — SODIUM CHLORIDE 0.9 % IJ SOLN
3.0000 mL | Freq: Two times a day (BID) | INTRAMUSCULAR | Status: DC
  Administered 2014-12-26: 3 mL via INTRAVENOUS

## 2014-12-25 MED ORDER — LIDOCAINE-PRILOCAINE 2.5-2.5 % EX CREA
1.0000 "application " | TOPICAL_CREAM | CUTANEOUS | Status: DC | PRN
  Filled 2014-12-25: qty 5

## 2014-12-25 MED ORDER — DEXTROSE 5 % IV SOLN
2.0000 g | INTRAVENOUS | Status: DC
  Administered 2014-12-26 – 2014-12-27 (×2): 2 g via INTRAVENOUS
  Filled 2014-12-25 (×2): qty 2

## 2014-12-25 MED ORDER — SODIUM CHLORIDE 0.9 % IV SOLN
1500.0000 mg | INTRAVENOUS | Status: AC
  Administered 2014-12-25: 1500 mg via INTRAVENOUS
  Filled 2014-12-25: qty 1500

## 2014-12-25 MED ORDER — GABAPENTIN 300 MG PO CAPS
300.0000 mg | ORAL_CAPSULE | Freq: Every day | ORAL | Status: DC
  Administered 2014-12-26 (×2): 300 mg via ORAL
  Filled 2014-12-25 (×5): qty 1

## 2014-12-25 MED ORDER — NEPRO/CARBSTEADY PO LIQD
237.0000 mL | ORAL | Status: DC | PRN
Start: 2014-12-25 — End: 2014-12-26

## 2014-12-25 MED ORDER — ACETAMINOPHEN 325 MG PO TABS
650.0000 mg | ORAL_TABLET | Freq: Four times a day (QID) | ORAL | Status: DC | PRN
  Administered 2014-12-26 – 2014-12-28 (×3): 650 mg via ORAL
  Filled 2014-12-25 (×3): qty 2

## 2014-12-25 MED ORDER — SODIUM CHLORIDE 0.9 % IV SOLN: 250.0000 mL | INTRAVENOUS | Status: DC | PRN

## 2014-12-25 MED ORDER — LIDOCAINE HCL (PF) 1 % IJ SOLN: 5.0000 mL | INTRAMUSCULAR | Status: DC | PRN

## 2014-12-25 MED ORDER — SENNOSIDES-DOCUSATE SODIUM 8.6-50 MG PO TABS: 1.0000 | ORAL_TABLET | Freq: Every evening | ORAL | Status: DC | PRN

## 2014-12-25 MED ORDER — NEPRO/CARBSTEADY PO LIQD: 237.0000 mL | ORAL | Status: DC | PRN

## 2014-12-25 MED ORDER — ONDANSETRON HCL 4 MG PO TABS: 4.0000 mg | ORAL_TABLET | Freq: Four times a day (QID) | ORAL | Status: DC | PRN

## 2014-12-25 MED ORDER — NITROGLYCERIN 0.4 MG SL SUBL: 0.4000 mg | SUBLINGUAL_TABLET | SUBLINGUAL | Status: DC | PRN

## 2014-12-25 MED ORDER — VANCOMYCIN HCL IN DEXTROSE 750-5 MG/150ML-% IV SOLN: 750.0000 mg | INTRAVENOUS | Status: DC

## 2014-12-25 MED ORDER — SODIUM CHLORIDE 0.9 % IV SOLN
100.0000 mL | INTRAVENOUS | Status: DC | PRN
Start: 2014-12-25 — End: 2014-12-26
  Administered 2014-12-25: 100 mL via INTRAVENOUS

## 2014-12-25 MED ORDER — CALCITRIOL 0.5 MCG PO CAPS
1.7500 ug | ORAL_CAPSULE | ORAL | Status: DC | PRN
  Filled 2014-12-25: qty 1

## 2014-12-25 MED ORDER — SODIUM CHLORIDE 0.9 % IV SOLN: 100.0000 mL | INTRAVENOUS | Status: DC | PRN

## 2014-12-25 MED ORDER — CALCIUM ACETATE 667 MG PO CAPS
667.0000 mg | ORAL_CAPSULE | Freq: Two times a day (BID) | ORAL | Status: DC
  Administered 2014-12-26 – 2014-12-28 (×4): 667 mg via ORAL
  Filled 2014-12-25 (×9): qty 1

## 2014-12-25 MED ORDER — DEXTROSE 5 % IV SOLN
2.0000 g | Freq: Once | INTRAVENOUS | Status: DC
  Filled 2014-12-25: qty 2

## 2014-12-25 MED ORDER — ALTEPLASE 2 MG IJ SOLR: 2.0000 mg | Freq: Once | INTRAMUSCULAR | Status: AC | PRN

## 2014-12-25 MED ORDER — VANCOMYCIN HCL IN DEXTROSE 1-5 GM/200ML-% IV SOLN
1000.0000 mg | Freq: Once | INTRAVENOUS | Status: DC
  Administered 2014-12-25: 1000 mg via INTRAVENOUS

## 2014-12-25 MED ORDER — ACETAMINOPHEN 650 MG RE SUPP: 650.0000 mg | Freq: Four times a day (QID) | RECTAL | Status: DC | PRN

## 2014-12-25 MED ORDER — SODIUM CHLORIDE 0.9 % IV BOLUS (SEPSIS)
500.0000 mL | Freq: Once | INTRAVENOUS | Status: DC
  Administered 2014-12-25: 500 mL via INTRAVENOUS

## 2014-12-25 MED ORDER — POLYETHYLENE GLYCOL 3350 17 G PO PACK
17.0000 g | PACK | Freq: Every day | ORAL | Status: DC
  Administered 2014-12-26 – 2014-12-29 (×4): 17 g via ORAL
  Filled 2014-12-25 (×5): qty 1

## 2014-12-25 MED ORDER — PIPERACILLIN-TAZOBACTAM 3.375 G IVPB 30 MIN: 3.3750 g | Freq: Once | INTRAVENOUS | Status: DC

## 2014-12-25 MED ORDER — SODIUM CHLORIDE 0.9 % IJ SOLN: 3.0000 mL | INTRAMUSCULAR | Status: DC | PRN

## 2014-12-25 MED ORDER — AZELASTINE HCL 0.15 % NA SOLN
1.0000 | Freq: Every day | NASAL | Status: DC
  Administered 2014-12-26 – 2014-12-27 (×2): 1 via NASAL
  Filled 2014-12-25: qty 30

## 2014-12-25 NOTE — ED Notes (Signed)
Dr Posey Pronto at bedside-- informed of difficulty drawing repeat lactic acid-- orders to draw ONE TIME from hand vein on right side-- repeated order back to Dr twice-- stated can draw from hand vein for lactic acid.

## 2014-12-25 NOTE — ED Notes (Signed)
Pt is a difficult IV stick-- IV team order placed at 1217-- ED staff attempted x 3-- dialysis pt, graft in right arm.

## 2014-12-25 NOTE — Code Documentation (Signed)
81yo female arriving to [facility] via [place].  EMS reports that the patient was LKW by her son at 46 yesterday.  Patient was found by a home health aide this morning with right sided weakness and difficulty speaking.  Stroke team to the bedside.  Code stroke canceled by Dr. Nicole Kindred at [phone].  No acute stroke treatment at this time.  Bedside handoff with ED RN Santiago Glad.

## 2014-12-25 NOTE — H&P (Signed)
[facility]          History and Physical    PCP:   Gilford Rile, MD   EDP: Fredia Sorrow, M.D.  Chief Complaint:  Confusion  HPI: Patient is an 81 year old woman with past medical history significant for end-stage renal disease on hemodialysis Tuesday Thursday Saturday, coronary artery disease, hyperlipidemia, COPD, low-grade stomach cancer who was brought today from her hospital with the above-mentioned complaints. Of note patient recently had a hospitalization during which she was diagnosed with pneumonia and given 3 days of vancomycin and Zosyn and later transitioned to Levaquin prior to discharge. Patient's daughter states that the home health aide called her today stating that her mother was less responsive, EMS was called and upon EMS arrival she was noted to have right greater than left extremity weakness a code stroke was called and she was brought into the hospital. She has already been seen by neurology. Subsequent workup has been negative for CVA including MRI and CT scan of the head. In the emergency department she was found to be febrile with a MAXIMUM TEMPERATURE of 101. She is tachycardic into the 120s, leukocytosis of 12.6, chest x-ray with right pleural effusion and atelectasis but early infection is in the differential. She does not produce any urine to send for UA. We have been asked to admit her for further evaluation and management.  Allergies:  No Known Allergies    Past Medical History  Diagnosis Date  . COPD (chronic obstructive pulmonary disease)     emphysema  . Neuropathy   . Hyperlipidemia   . Diabetes mellitus   . Depression   . Anemia   . Shortness of breath   . GERD (gastroesophageal reflux disease)   . Hyperparathyroidism   . Hypertension     sees Dr. Gilford Rile  . Headache(784.0)   . Arthritis   . CAD (coronary artery disease)     sees Dr. Jenne Campus, [facility], [place]  . Steal syndrome of  hand jan- 2014    left hand  . Wears glasses   . Wears hearing aid     right  . Presence of surgically created AV shunt for hemodialysis     old lt upper arm out-rt upper arm shunt in  . Anginal pain     Dr Raliegh Ip in [place], [place]  . History of kidney stones   . Constipation   . History of blood transfusion   . Asthma     years ago  . Chronic kidney disease     [facility] [place], [place]- Hemo    Past Surgical History  Procedure Laterality Date  . Abdominal hysterectomy  1976  . Kidney stone surgery  2013  . Av fistula placement  01/20/2012    Procedure: ARTERIOVENOUS (AV) FISTULA CREATION;  Surgeon: Elam Dutch, MD;  Location: [place];  Service: Vascular;  Laterality: Left;  . Hemodialysis catheter  05/25/12    secondary to failed AVF   . Eye surgery      bilateral cataract removed  . Av fistula placement  09/05/2012    Procedure: INSERTION OF ARTERIOVENOUS (AV) GORE-TEX GRAFT ARM;  Surgeon: Elam Dutch, MD;  [facility];  Service: Vascular;  Laterality: Left;  Using 4-37mm x 45 cm Vascular stretch goretex graft  . Ligation of arteriovenous  fistula  09/05/2012    Procedure: LIGATION OF ARTERIOVENOUS  FISTULA;  Surgeon: Elam Dutch, MD;  Location: [place];  Service: Vascular;  Laterality: Left;  . Ligation arteriovenous gortex graft  09/05/2012    Procedure: LIGATION ARTERIOVENOUS GORTEX GRAFT;  Surgeon: Elam Dutch, MD;  Location: [place];  Service: Vascular;  Laterality: Left;  . Av fistula placement Right 11/07/2012    Procedure: ARTERIOVENOUS (AV) FISTULA CREATION;  Surgeon: Elam Dutch, MD;  [facility] OR;  Service: Vascular;  Laterality: Right;  Bascilic Vein Fistula  . Appendectomy    . Carpal tunnel release Left 02/10/2013    Procedure: CARPAL TUNNEL RELEASE;  Surgeon: Cammie Sickle., MD;  Location: [place];  Service: Orthopedics;  Laterality: Left;  . Bascilic vein transposition Right 03/15/2013    Procedure: 2ND STAGE BASCILIC VEIN  TRANSPOSITION - RIGHT ARM;  Surgeon: Elam Dutch, MD;  Location: [place];  Service: Vascular;  Laterality: Right;  . Av fistula placement Right 05/15/2013    Procedure: INSERTION OF ARTERIOVENOUS (AV) GORE-TEX GRAFT ARM-RIGHT;  Surgeon: Elam Dutch, MD;  Location: [place];  Service: Vascular;  Laterality: Right;  . Shuntogram N/A 07/27/2012    Procedure: Earney Mallet;  Surgeon: Rosetta Posner, MD;  [facility];  Service: Cardiovascular;  Laterality: N/A;    Prior to Admission medications   Medication Sig Start Date End Date Taking? Authorizing Provider  albuterol (PROVENTIL) (2.5 MG/3ML) 0.083% nebulizer solution Take 2.5 mg by nebulization every 6 (six) hours as needed for wheezing or shortness of breath.   Yes Historical Provider, MD  amLODipine (NORVASC) 2.5 MG tablet Take 1.25 mg by mouth daily.   Yes Historical Provider, MD  aspirin EC 81 MG tablet Take 81 mg by mouth daily.   Yes Historical Provider, MD  Azelastine HCl 0.15 % SOLN Place 1 spray into both nostrils daily.  10/10/14  Yes Historical Provider, MD  beclomethasone (QVAR) 80 MCG/ACT inhaler Inhale 2 puffs into the lungs 2 (two) times daily.   Yes Historical Provider, MD  calcium acetate (PHOSLO) 667 MG capsule Take 667 mg by mouth 2 (two) times daily before a meal.    Yes Historical Provider, MD  Docusate Calcium (STOOL SOFTENER PO) Take 1 tablet by mouth as needed (for constipation).   Yes Historical Provider, MD  gabapentin (NEURONTIN) 300 MG capsule Take 300 mg by mouth at bedtime.   Yes Historical Provider, MD  HYDROcodone-acetaminophen (NORCO) 10-325 MG per tablet Take 1 tablet by mouth every 6 (six) hours as needed for severe pain. 10/15/14  Yes Carmin Muskrat, MD  multivitamin (RENA-VIT) TABS tablet Take 1 tablet by mouth daily.   Yes Historical Provider, MD  nitroGLYCERIN (NITROSTAT) 0.4 MG SL tablet Place 0.4 mg under the tongue every 5 (five) minutes as needed for chest pain.   Yes Historical Provider, MD    Nutritional Supplements (FEEDING SUPPLEMENT, NEPRO CARB STEADY,) LIQD Take 237 mLs by mouth as needed (missed meal during dialysis.). 11/20/14  Yes Belkys A Regalado, MD  pantoprazole (PROTONIX) 40 MG tablet Take 40 mg by mouth daily.   Yes Historical Provider, MD  polyethylene glycol (MIRALAX / GLYCOLAX) packet Take 17 g by mouth daily.   Yes Historical Provider, MD  simvastatin (ZOCOR) 5 MG tablet Take 5 mg by mouth at bedtime.    Yes Historical Provider, MD  traMADol (ULTRAM) 50 MG tablet Take 50 mg by mouth every 6 (six) hours as needed. for pain 08/28/14  Yes Historical Provider, MD  levofloxacin (LEVAQUIN) 250 MG tablet Take  1 tablet (250 mg total) by mouth daily. Patient not taking: Reported on 12/25/2014 11/20/14   Alba Cory, MD    Social History:  reports that she has never smoked. She has never used smokeless tobacco. She reports that she does not drink alcohol or use illicit drugs.  Family History  Problem Relation Age of Onset  . Diabetes Mother   . Cancer Father   . Deep vein thrombosis Brother   . Hypertension Brother     Review of Systems:  Unable to obtain given current mental state  Physical Exam: Blood pressure 157/65, pulse 122, temperature 101 F (38.3 C), temperature source Rectal, resp. rate 22, SpO2 91 %. General: Awake, can answer simple questions oriented to person and place only. HEENT: Normocephalic, atraumatic, pupils equal round and reactive to light, somewhat dry mucous membranes. Neck: Supple, no JVD, no lymphadenopathy, no bruits, no goiter.  Cardiovascular: Tachycardic, regular, no murmurs, rubs or gallops. Lungs: Clear to auscultation bilaterally. Abdomen: Obese, soft, nontender, nondistended, positive bowel sounds. Extremities: No clubbing, cyanosis or edema, positive pedal pulses. Neurologic: Awake, can answer simple questions, moves all 4 extremities spontaneously, not able to fully follow commands.  Labs on Admission:  Results for  orders placed or performed during the hospital encounter of 12/25/14 (from the past 48 hour(s))  Ethanol     Status: None   Collection Time: 12/25/14  9:13 AM  Result Value Ref Range   Alcohol, Ethyl (B) <5 0 - 9 mg/dL    Comment:        LOWEST DETECTABLE LIMIT FOR SERUM ALCOHOL IS 11 mg/dL FOR MEDICAL PURPOSES ONLY   Protime-INR     Status: Abnormal   Collection Time: 12/25/14  9:13 AM  Result Value Ref Range   Prothrombin Time 15.6 (H) 11.6 - 15.2 seconds   INR 1.23 0.00 - 1.49  APTT     Status: None   Collection Time: 12/25/14  9:13 AM  Result Value Ref Range   aPTT 32 24 - 37 seconds  CBC     Status: Abnormal   Collection Time: 12/25/14  9:13 AM  Result Value Ref Range   WBC 12.6 (H) 4.0 - 10.5 K/uL   RBC 3.58 (L) 3.87 - 5.11 MIL/uL   Hemoglobin 9.5 (L) 12.0 - 15.0 g/dL   HCT 29.5 (L) 06.4 - 62.8 %   MCV 86.3 78.0 - 100.0 fL   MCH 26.5 26.0 - 34.0 pg   MCHC 30.7 30.0 - 36.0 g/dL   RDW 80.5 (H) 59.8 - 60.9 %   Platelets 378 150 - 400 K/uL  Differential     Status: Abnormal   Collection Time: 12/25/14  9:13 AM  Result Value Ref Range   Neutrophils Relative % 79 (H) 43 - 77 %   Neutro Abs 9.8 (H) 1.7 - 7.7 K/uL   Lymphocytes Relative 13 12 - 46 %   Lymphs Abs 1.7 0.7 - 4.0 K/uL   Monocytes Relative 7 3 - 12 %   Monocytes Absolute 0.9 0.1 - 1.0 K/uL   Eosinophils Relative 1 0 - 5 %   Eosinophils Absolute 0.1 0.0 - 0.7 K/uL   Basophils Relative 0 0 - 1 %   Basophils Absolute 0.0 0.0 - 0.1 K/uL  Comprehensive metabolic panel     Status: Abnormal   Collection Time: 12/25/14  9:13 AM  Result Value Ref Range   Sodium 137 135 - 145 mmol/L   Potassium 4.5 3.5 - 5.1 mmol/L  Chloride 93 (L) 96 - 112 mmol/L   CO2 30 19 - 32 mmol/L   Glucose, Bld 117 (H) 70 - 99 mg/dL   BUN 20 6 - 23 mg/dL   Creatinine, Ser 9.32 (H) 0.50 - 1.10 mg/dL   Calcium 9.0 8.4 - 10.5 mg/dL   Total Protein 8.3 6.0 - 8.3 g/dL   Albumin 2.7 (L) 3.5 - 5.2 g/dL   AST 16 0 - 37 U/L   ALT 8 0 - 35 U/L    Alkaline Phosphatase 97 39 - 117 U/L   Total Bilirubin 0.7 0.3 - 1.2 mg/dL   GFR calc non Af Amer 3 (L) >90 mL/min   GFR calc Af Amer 4 (L) >90 mL/min    Comment: (NOTE) The eGFR has been calculated using the CKD EPI equation. This calculation has not been validated in all clinical situations. eGFR's persistently <90 mL/min signify possible Chronic Kidney Disease.    Anion gap 14 5 - 15  I-Stat Troponin, ED (not at [facility])     Status: None   Collection Time: 12/25/14  9:48 AM  Result Value Ref Range   Troponin i, poc 0.04 0.00 - 0.08 ng/mL   Comment 3            Comment: Due to the release kinetics of cTnI, a negative result within the first hours of the onset of symptoms does not rule out myocardial infarction with certainty. If myocardial infarction is still suspected, repeat the test at appropriate intervals.   I-Stat Chem 8, ED     Status: Abnormal   Collection Time: 12/25/14  9:50 AM  Result Value Ref Range   Sodium 137 135 - 145 mmol/L   Potassium 4.6 3.5 - 5.1 mmol/L   Chloride 94 (L) 96 - 112 mmol/L   BUN 25 (H) 6 - 23 mg/dL   Creatinine, Ser 9.00 (H) 0.50 - 1.10 mg/dL   Glucose, Bld 121 (H) 70 - 99 mg/dL   Calcium, Ion 1.03 (L) 1.13 - 1.30 mmol/L   TCO2 30 0 - 100 mmol/L   Hemoglobin 11.9 (L) 12.0 - 15.0 g/dL   HCT 35.0 (L) 36.0 - 46.0 %    Radiological Exams on Admission: Ct Head Wo Contrast  12/25/2014   CLINICAL DATA:  Right-sided weakness. Symptoms began last night. Difficulty speaking.  EXAM: CT HEAD WITHOUT CONTRAST  TECHNIQUE: Contiguous axial images were obtained from the base of the skull through the vertex without intravenous contrast.  COMPARISON:  11/17/2014  FINDINGS: There is atrophy and chronic small vessel disease changes. No acute intracranial abnormality. Specifically, no hemorrhage, hydrocephalus, mass lesion, acute infarction, or significant intracranial injury. No acute calvarial abnormality.  Extensive mucosal thickening throughout the paranasal  sinuses. Mastoid air cells are clear. Orbital soft tissues unremarkable.  IMPRESSION: No acute intracranial abnormality.  Atrophy, chronic microvascular disease.  Chronic sinusitis.   Electronically Signed   By: Rolm Baptise M.D.   On: 12/25/2014 09:24   Mr Brain Wo Contrast  12/25/2014   CLINICAL DATA:  81 year old female with slurred speech and right side weakness since this morning. Initial encounter. Current history of end-stage renal disease on dialysis.  EXAM: MRI HEAD WITHOUT CONTRAST  TECHNIQUE: Multiplanar, multiecho pulse sequences of the brain and surrounding structures were obtained without intravenous contrast.  COMPARISON:  Head CT without contrast 0916 hours today. Brain MRI 11/18/2014 and 11/25/2012.  FINDINGS: Major intracranial vascular flow voids are stable. Stable cerebral volume. No restricted diffusion to suggest acute infarction.  No midline shift, mass effect, evidence of mass lesion, ventriculomegaly, extra-axial collection or acute intracranial hemorrhage. Cervicomedullary junction and pituitary are within normal limits.  Patchy and confluent bilateral cerebral Karman matter T2 and FLAIR hyperintensity, better depicted in 2014 and not significantly changed since that time. Similar Patchy and confluent bilateral deep gray matter T2 hyperintensity, and pons hyperintensity. Cerebellum within normal limits. No cortical encephalomalacia or chronic blood products identified in the brain.  Stable visualized internal auditory structures. Mild mastoid effusion now on the right. Small volume retained secretions in the pharynx.  Chronic sinusitis and polypoid material in the right nasal cavity, mildly progressed since 2014.  Stable orbits soft tissues. Visualized scalp soft tissues are within normal limits.  Increasingly heterogeneous bone marrow signal diffusely including in the upper cervical spine.  IMPRESSION: 1. No acute intracranial abnormality. Stable Patchy and confluent signal abnormality in  the brain since 2014, favor small vessel disease related. 2. 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 3. Chronic but progressed paranasal sinusitis and right nasal cavity polyp(s).   Electronically Signed   By: Genevie Ann M.D.   On: 12/25/2014 11:52   Dg Chest Port 1 View  12/25/2014   CLINICAL DATA:  Dialysis complication, initial encounter.  EXAM: PORTABLE CHEST - 1 VIEW  COMPARISON:  One-view chest 11/17/2014  FINDINGS: The heart is enlarged. Atherosclerotic changes are noted at the aortic arch. Asymmetric right-sided interstitial changes are noted. A right pleural effusion is suspected. Plate late atelectasis or scarring in the left midlung is stable. The visualized soft tissues and bony thorax are unremarkable.  IMPRESSION: 1. Cardiomegaly. 2. There is no asymmetric right-sided interstitial and airspace disease concerning for edema. 3. Probable right pleural effusion and atelectasis. Early infection is included in the differential. 4. Interval clearing of the left lung base. 5. Atherosclerosis.   Electronically Signed   By: San Morelle M.D.   On: 12/25/2014 10:53    Assessment/Plan Principal Problem:   Acute encephalopathy Active Problems:   TIA (transient ischemic attack)   End stage renal disease on dialysis   Hypertension   Right sided weakness   Sepsis    Acute encephalopathy -Per daughter appears at baseline currently. She does have some baseline dementia. -MRI confirms that there has not been a CVA. Doubt TIA as etiology. -Suspect related to ongoing sepsis.  Sepsis -Etiology is still unclear at this moment, however chest x-ray planes to possibly early infection. -We'll treat as hospital-acquired pneumonia with broad-spectrum antibiotics to include vancomycin and cefepime given her end-stage renal disease state and her recent hospitalization. -Blood/sputum cultures will be  ordered. -Legionella/strep pneumo urinary antigens ordered. -Follow fever curve and leukocytosis on antibiotics. -Does not produce sufficient urine to draw a UA. -She is tachycardic into the 120s. She is in the process of receiving a 500 mL bolus. Will hold off on further fluids given her dialysis state.  End-stage renal disease -On hemodialysis Tuesday Thursday Saturday. -Dr. Posey Pronto with nephrology has been consulted for hemodialysis as she is due today.  Hypertension -Well-controlled at present. -Continue home medications.  DVT prophylaxis Subcutaneous heparin.  CODE STATUS -Full code as discussed with daughter Arbie Cookey at bedside.   Time Spent on Admission: 95 minutes  [facility] Pager: [phone] 12/25/2014, 1:25 PM

## 2014-12-25 NOTE — Consult Note (Signed)
Reason for Consult: Continuity of ESRD care Referring Physician: Domingo Mend M.D. [facility])  HPI:  81 year old African-American woman with past medical history significant for hypertension, type 2 diabetes mellitus and consequent ESRD on hemodialysis at [facility] on a Tuesday/Thursday and Saturday schedule. She also has a history of underlying COPD and low-grade stomach cancer. She was recently discharged from the hospital (11/20/2014) following an admission for altered mental status and pneumonia. She saw her primary care provider this past Friday for a follow-up chest x-ray and her pneumonia was noted to be in slow resolution following the completion of antibiotic therapy. Over the weekend, she had some self-limited diarrhea on Saturday and today when seen by her home health nurse was found to be less responsive and with asymmetric weakness-right greater than left upper extremity prompting activation of Code stroke following which she was brought to [facility] emergency room and underwent emergent CT scan/MRI that was negative for CVA.  In the ER noted to be febrile (101), tachycardic (120s) and with leukocytosis while chest x-ray showed right-sided pleural effusion/atelectasis. When I saw her, she appears to be withdrawn and considerably less verbal than I know her from her baseline but she surprisingly oriented to time/place and person.  Past Medical History  Diagnosis Date  . COPD (chronic obstructive pulmonary disease)     emphysema  . Neuropathy   . Hyperlipidemia   . Diabetes mellitus   . Depression   . Anemia   . Shortness of breath   . GERD (gastroesophageal reflux disease)   . Hyperparathyroidism   . Hypertension     sees Dr. Gilford Rile  . Headache(784.0)   . Arthritis   . CAD (coronary artery disease)     sees Dr. Jenne Campus, [facility] cardiology [facility], [place]  . 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 Wears glasses   . Wears hearing aid     right  .  Presence of surgically created AV shunt for hemodialysis     old lt upper arm out-rt upper arm shunt in  . Anginal pain     Dr Raliegh Ip in [place], [place]  . History of kidney stones   . Constipation   . History of blood transfusion   . Asthma     years ago  . Chronic kidney disease     TTH SAT [place], [place]- Hemo    Past Surgical History  Procedure Laterality Date  . Abdominal hysterectomy  1976  . Kidney stone surgery  2013  . Av fistula placement  01/20/2012    Procedure: ARTERIOVENOUS (AV) FISTULA CREATION;  Surgeon: Elam Dutch, MD;  Location: [place];  Service: Vascular;  Laterality: Left;  . Hemodialysis catheter  05/25/12    secondary to failed AVF   . Eye surgery      bilateral cataract removed  . Av fistula placement  09/05/2012    Procedure: INSERTION OF ARTERIOVENOUS (AV) GORE-TEX GRAFT ARM;  Surgeon: Elam Dutch, MD;  Location: [facility] OR;  Service: Vascular;  Laterality: Left;  Using 4-73m x 45 cm Vascular stretch goretex graft  . Ligation of arteriovenous  fistula  09/05/2012    Procedure: LIGATION OF ARTERIOVENOUS  FISTULA;  Surgeon: CElam Dutch MD;  [place]  Service: Vascular;  Laterality: Left;  . Ligation arteriovenous gortex graft  09/05/2012    Procedure: LIGATION ARTERIOVENOUS GORTEX GRAFT;  Surgeon: CElam Dutch MD;  [place]  Service: Vascular;  Laterality:  Left;  . Av fistula placement Right 11/07/2012    Procedure: ARTERIOVENOUS (AV) FISTULA CREATION;  Surgeon: Elam Dutch, MD;  Location: [facility] OR;  Service: Vascular;  Laterality: Right;  Bascilic Vein Fistula  . Appendectomy    . Carpal tunnel release Left 02/10/2013    Procedure: CARPAL TUNNEL RELEASE;  Surgeon: Cammie Sickle., MD;  Location: [place];  Service: Orthopedics;  Laterality: Left;  . Bascilic vein transposition Right 03/15/2013    Procedure: 2ND STAGE BASCILIC VEIN TRANSPOSITION - RIGHT ARM;  Surgeon: Elam Dutch, MD;  Location: [place];  Service:  Vascular;  Laterality: Right;  . Av fistula placement Right 05/15/2013    Procedure: INSERTION OF ARTERIOVENOUS (AV) GORE-TEX GRAFT ARM-RIGHT;  Surgeon: Elam Dutch, MD;  Location: [place];  Service: Vascular;  Laterality: Right;  . Shuntogram N/A 07/27/2012    Procedure: Earney Mallet;  Surgeon: Rosetta Posner, MD;  Location: [facility] CATH LAB;  Service: Cardiovascular;  Laterality: N/A;    Family History  Problem Relation Age of Onset  . Diabetes Mother   . Cancer Father   . Deep vein thrombosis Brother   . Hypertension Brother     Social History:  reports that she has never smoked. She has never used smokeless tobacco. She reports that she does not drink alcohol or use illicit drugs.  Allergies: No Known Allergies  Medications:  Scheduled: . [START ON 12/27/2014] vancomycin  750 mg Intravenous Q T,Th,Sa-HD    Results for orders placed or performed during the hospital encounter of 12/25/14 (from the past 48 hour(s))  Ethanol     Status: None   Collection Time: 12/25/14  9:13 AM  Result Value Ref Range   Alcohol, Ethyl (B) <5 0 - 9 mg/dL    Comment:        LOWEST DETECTABLE LIMIT FOR SERUM ALCOHOL IS 11 mg/dL FOR MEDICAL PURPOSES ONLY   Protime-INR     Status: Abnormal   Collection Time: 12/25/14  9:13 AM  Result Value Ref Range   Prothrombin Time 15.6 (H) 11.6 - 15.2 seconds   INR 1.23 0.00 - 1.49  APTT     Status: None   Collection Time: 12/25/14  9:13 AM  Result Value Ref Range   aPTT 32 24 - 37 seconds  CBC     Status: Abnormal   Collection Time: 12/25/14  9:13 AM  Result Value Ref Range   WBC 12.6 (H) 4.0 - 10.5 K/uL   RBC 3.58 (L) 3.87 - 5.11 MIL/uL   Hemoglobin 9.5 (L) 12.0 - 15.0 g/dL   HCT 30.9 (L) 36.0 - 46.0 %   MCV 86.3 78.0 - 100.0 fL   MCH 26.5 26.0 - 34.0 pg   MCHC 30.7 30.0 - 36.0 g/dL   RDW 20.1 (H) 11.5 - 15.5 %   Platelets 378 150 - 400 K/uL  Differential     Status: Abnormal   Collection Time: 12/25/14  9:13 AM  Result Value Ref Range   Neutrophils  Relative % 79 (H) 43 - 77 %   Neutro Abs 9.8 (H) 1.7 - 7.7 K/uL   Lymphocytes Relative 13 12 - 46 %   Lymphs Abs 1.7 0.7 - 4.0 K/uL   Monocytes Relative 7 3 - 12 %   Monocytes Absolute 0.9 0.1 - 1.0 K/uL   Eosinophils Relative 1 0 - 5 %   Eosinophils Absolute 0.1 0.0 - 0.7 K/uL   Basophils Relative 0 0 - 1 %  Basophils Absolute 0.0 0.0 - 0.1 K/uL  Comprehensive metabolic panel     Status: Abnormal   Collection Time: 12/25/14  9:13 AM  Result Value Ref Range   Sodium 137 135 - 145 mmol/L   Potassium 4.5 3.5 - 5.1 mmol/L   Chloride 93 (L) 96 - 112 mmol/L   CO2 30 19 - 32 mmol/L   Glucose, Bld 117 (H) 70 - 99 mg/dL   BUN 20 6 - 23 mg/dL   Creatinine, Ser 9.32 (H) 0.50 - 1.10 mg/dL   Calcium 9.0 8.4 - 10.5 mg/dL   Total Protein 8.3 6.0 - 8.3 g/dL   Albumin 2.7 (L) 3.5 - 5.2 g/dL   AST 16 0 - 37 U/L   ALT 8 0 - 35 U/L   Alkaline Phosphatase 97 39 - 117 U/L   Total Bilirubin 0.7 0.3 - 1.2 mg/dL   GFR calc non Af Amer 3 (L) >90 mL/min   GFR calc Af Amer 4 (L) >90 mL/min    Comment: (NOTE) The eGFR has been calculated using the CKD EPI equation. This calculation has not been validated in all clinical situations. eGFR's persistently <90 mL/min signify possible Chronic Kidney Disease.    Anion gap 14 5 - 15  I-Stat Troponin, ED (not at [facility])     Status: None   Collection Time: 12/25/14  9:48 AM  Result Value Ref Range   Troponin i, poc 0.04 0.00 - 0.08 ng/mL   Comment 3            Comment: Due to the release kinetics of cTnI, a negative result within the first hours of the onset of symptoms does not rule out myocardial infarction with certainty. If myocardial infarction is still suspected, repeat the test at appropriate intervals.   I-Stat Chem 8, ED     Status: Abnormal   Collection Time: 12/25/14  9:50 AM  Result Value Ref Range   Sodium 137 135 - 145 mmol/L   Potassium 4.6 3.5 - 5.1 mmol/L   Chloride 94 (L) 96 - 112 mmol/L   BUN 25 (H) 6 - 23 mg/dL   Creatinine, Ser  9.00 (H) 0.50 - 1.10 mg/dL   Glucose, Bld 121 (H) 70 - 99 mg/dL   Calcium, Ion 1.03 (L) 1.13 - 1.30 mmol/L   TCO2 30 0 - 100 mmol/L   Hemoglobin 11.9 (L) 12.0 - 15.0 g/dL   HCT 35.0 (L) 36.0 - 46.0 %  I-Stat CG4 Lactic Acid, ED (not at [facility])     Status: None   Collection Time: 12/25/14  1:50 PM  Result Value Ref Range   Lactic Acid, Venous 1.19 0.5 - 2.0 mmol/L    Ct Head Wo Contrast  12/25/2014   CLINICAL DATA:  Right-sided weakness. Symptoms began last night. Difficulty speaking.  EXAM: CT HEAD WITHOUT CONTRAST  TECHNIQUE: Contiguous axial images were obtained from the base of the skull through the vertex without intravenous contrast.  COMPARISON:  11/17/2014  FINDINGS: There is atrophy and chronic small vessel disease changes. No acute intracranial abnormality. Specifically, no hemorrhage, hydrocephalus, mass lesion, acute infarction, or significant intracranial injury. No acute calvarial abnormality.  Extensive mucosal thickening throughout the paranasal sinuses. Mastoid air cells are clear. Orbital soft tissues unremarkable.  IMPRESSION: No acute intracranial abnormality.  Atrophy, chronic microvascular disease.  Chronic sinusitis.   Electronically Signed   By: Rolm Baptise M.D.   On: 12/25/2014 09:24   Mr Brain Wo Contrast  12/25/2014   CLINICAL  DATA:  81 year old female with slurred speech and right side weakness since this morning. Initial encounter. Current history of end-stage renal disease on dialysis.  EXAM: MRI HEAD WITHOUT CONTRAST  TECHNIQUE: Multiplanar, multiecho pulse sequences of the brain and surrounding structures were obtained without intravenous contrast.  COMPARISON:  Head CT without contrast 0916 hours today. Brain MRI 11/18/2014 and 11/25/2012.  FINDINGS: Major intracranial vascular flow voids are stable. Stable cerebral volume. No restricted diffusion to suggest acute infarction. No midline shift, mass effect, evidence of mass lesion, ventriculomegaly, extra-axial collection  or acute intracranial hemorrhage. Cervicomedullary junction and pituitary are within normal limits.  Patchy and confluent bilateral cerebral Caylor matter T2 and FLAIR hyperintensity, better depicted in 2014 and not significantly changed since that time. Similar Patchy and confluent bilateral deep gray matter T2 hyperintensity, and pons hyperintensity. Cerebellum within normal limits. No cortical encephalomalacia or chronic blood products identified in the brain.  Stable visualized internal auditory structures. Mild mastoid effusion now on the right. Small volume retained secretions in the pharynx.  Chronic sinusitis and polypoid material in the right nasal cavity, mildly progressed since 2014.  Stable orbits soft tissues. Visualized scalp soft tissues are within normal limits.  Increasingly heterogeneous bone marrow signal diffusely including in the upper cervical spine.  IMPRESSION: 1. No acute intracranial abnormality. Stable Patchy and confluent signal abnormality in the brain since 2014, favor small vessel disease related. 2. 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 3. Chronic but progressed paranasal sinusitis and right nasal cavity polyp(s).   Electronically Signed   By: Genevie Ann M.D.   On: 12/25/2014 11:52   Dg Chest Port 1 View  12/25/2014   CLINICAL DATA:  Dialysis complication, initial encounter.  EXAM: PORTABLE CHEST - 1 VIEW  COMPARISON:  One-view chest 11/17/2014  FINDINGS: The heart is enlarged. Atherosclerotic changes are noted at the aortic arch. Asymmetric right-sided interstitial changes are noted. A right pleural effusion is suspected. Plate late atelectasis or scarring in the left midlung is stable. The visualized soft tissues and bony thorax are unremarkable.  IMPRESSION: 1. Cardiomegaly. 2. There is no asymmetric right-sided interstitial and airspace disease concerning for edema. 3. Probable right  pleural effusion and atelectasis. Early infection is included in the differential. 4. Interval clearing of the left lung base. 5. Atherosclerosis.   Electronically Signed   By: San Morelle M.D.   On: 12/25/2014 10:53    Review of Systems  Constitutional: Positive for fever, chills and malaise/fatigue.  HENT: Negative.   Eyes: Negative.   Respiratory: Negative.   Cardiovascular: Negative.   Gastrointestinal: Positive for diarrhea. Negative for nausea, vomiting and abdominal pain.       Diarrhea over weekend  Genitourinary: Negative.   Musculoskeletal: Positive for back pain.  Skin: Negative.   Neurological: Positive for focal weakness and weakness. Negative for dizziness and sensory change.   Blood pressure 156/60, pulse 114, temperature 98.9 F (37.2 C), temperature source Oral, resp. rate 27, SpO2 93 %. Physical Exam  Nursing note and vitals reviewed. Constitutional: She is oriented to person, place, and time. She appears well-developed and well-nourished.  Appears quiet/withdrawn-not her baseline  HENT:  Head: Normocephalic and atraumatic.  Nose: Nose normal.  Eyes: Conjunctivae and EOM are normal. Pupils are equal, round, and reactive to light. No scleral icterus.  Neck: Normal range of motion. Neck supple. No JVD present. No thyromegaly present.  Cardiovascular: Regular rhythm and normal heart sounds.  No murmur heard. Respiratory: Effort normal. She has rales.  Right base rales  GI: Soft. Bowel sounds are normal. She exhibits no distension. There is no tenderness.  Musculoskeletal: Normal range of motion. She exhibits no edema.  Neurological: She is alert and oriented to person, place, and time.  Oriented to time= afternoon, place = [facility] and person=recognizes nurse, daughter and me  Skin: Skin is warm and dry. No rash noted.    Assessment/Plan: 1. Altered mental status: Suspected to be from acute illness delirium-likely pneumonia. CT scan and MRI of the  brain that was done did not show CVA. She will be admitted to the hospital for observation and management of underlying infection. 2. Pneumonia with possible sepsis: On proximal spectrum antibiotic coverage for healthcare associated pneumonia given recent hospitalization with blood cultures and sputum cultures. Attempting to collect a urine specimen to rule out possible UTI. 3. ESRD: Hemodialysis will be ordered for today-no acute needs and she may be stable enough to wait until tomorrow if cannot be accommodated. 4. Anemia of chronic kidney disease: Hemoglobin currently at goal, no indications for ESA at this time. 5. Metabolic bone disease: Resume phosphorus binders as well as calcitriol at dialysis.  Darina Hartwell K. 12/25/2014, 3:04 PM

## 2014-12-25 NOTE — Progress Notes (Addendum)
ANTIBIOTIC CONSULT NOTE - INITIAL  Pharmacy Consult for Vancomycin and Zosyn Indication: sepsis  No Known Allergies  Patient Measurements:    Ht: 62 in   Wt: 75 kg  Vital Signs: Temp: 101 F (38.3 C) (04/26 1226) Temp Source: Rectal (04/26 1226) BP: 157/65 mmHg (04/26 1149) Pulse Rate: 122 (04/26 1149) Intake/Output from previous day:   Intake/Output from this shift:    Labs:  Recent Labs  12/25/14 0913 12/25/14 0950  WBC 12.6*  --   HGB 9.5* 11.9*  PLT 378  --   CREATININE 9.32* 9.00*   CrCl cannot be calculated (Unknown ideal weight.). No results for input(s): VANCOTROUGH, VANCOPEAK, VANCORANDOM, GENTTROUGH, GENTPEAK, GENTRANDOM, TOBRATROUGH, TOBRAPEAK, TOBRARND, AMIKACINPEAK, AMIKACINTROU, AMIKACIN in the last 72 hours.   Microbiology: No results found for this or any previous visit (from the past 720 hour(s)).  Medical History: Past Medical History  Diagnosis Date  . COPD (chronic obstructive pulmonary disease)     emphysema  . Neuropathy   . Hyperlipidemia   . Diabetes mellitus   . Depression   . Anemia   . Shortness of breath   . GERD (gastroesophageal reflux disease)   . Hyperparathyroidism   . Hypertension     sees Dr. Gilford Rile  . Headache(784.0)   . Arthritis   . CAD (coronary artery disease)     sees Dr. Jenne Campus, [facility] cardiology [facility], [place]  . 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 Wears glasses   . Wears hearing aid     right  . Presence of surgically created AV shunt for hemodialysis     old lt upper arm out-rt upper arm shunt in  . Anginal pain     Dr Raliegh Ip in [place], [place]  . History of kidney stones   . Constipation   . History of blood transfusion   . Asthma     years ago  . Chronic kidney disease     TTH SAT [place], [place]- Hemo    Medications:  See electronic med rec  Assessment: 81 y.o. female presents with slurred speech and R-sided weakness. Pt also found to have fever 101. HR  120s, elevated WBC to 12.6. To begin broad spectrum antibiotics (Vancomycin and Zosyn) for sepsis. Pt is ESRD - T/T/S HD as o/p.   Goal of Therapy:  Pre-HD vancomycin level 15-25 mcg/ml  Plan:  Vancomycin 1500mg  IV now then 750mg  past HD Zosyn 2.25gm IV q8h Will f/u HD schedule, pt's clinical condition, and micro data Pre-HD Vanc level at [facility]  Sherlon Handing, PharmD, BCPS Clinical pharmacist, pager [phone] 12/25/2014,12:37 PM     ======================   Addendum: - confirmed with HD RN, ready for patient's HD session tonight - changed from Zosyn to Cefepime; first dose on cefepime has not been given, rescheduling to be given with HD   Plan: - Vanc 750mg  IV q-HD TTS - Cefepime 2gm IV q-HD TTS - F/U HD tolerance/schedule, pre-HD vanc level    Pearce Littlefield D. Mina Marble, PharmD, BCPS Pager:  [phone] 12/25/2014, 7:52 PM

## 2014-12-25 NOTE — ED Notes (Addendum)
Patient in MRI 

## 2014-12-25 NOTE — ED Notes (Signed)
Pt to MRI

## 2014-12-25 NOTE — ED Notes (Signed)
Patient return back from MRI and placed back on monitor.

## 2014-12-25 NOTE — ED Provider Notes (Signed)
CSN: [id number]     Arrival date & time 12/25/14  6834 History   First MD Initiated Contact with Patient 12/25/14 [phone]     Chief Complaint  Patient presents with  . Stroke Symptoms     (Consider location/radiation/quality/duration/timing/severity/associated sxs/prior Treatment) The history is provided by the patient and the EMS personnel.   patient brought in from home by EMS. Last seen normal last night at 7 PM. Patient had a home health CNA come in this morning and noticed that patient was unable to ambulate and more confused than her baseline. Patient is normally dialyzed on Tuesdays Thursdays and Saturdays. Patient according to EMS was able to follow commands but had some right-sided weakness. Patient does have a history of dementia diabetes hypertension and a right AV graft. Patient arrived as a code stroke.  Past Medical History  Diagnosis Date  . COPD (chronic obstructive pulmonary disease)     emphysema  . Neuropathy   . Hyperlipidemia   . Diabetes mellitus   . Depression   . Anemia   . Shortness of breath   . GERD (gastroesophageal reflux disease)   . Hyperparathyroidism   . Hypertension     sees Dr. Gilford Rile  . Headache(784.0)   . Arthritis   . CAD (coronary artery disease)     sees Dr. Jenne Campus, [facility] cardiology [place], [place]  . 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 Wears glasses   . Wears hearing aid     right  . Presence of surgically created AV shunt for hemodialysis     old lt upper arm out-rt upper arm shunt in  . Anginal pain     Dr Raliegh Ip in [place], [place]  . History of kidney stones   . Constipation   . History of blood transfusion   . Asthma     years ago  . Chronic kidney disease     [facility] [place], [place]- Hemo   Past Surgical History  Procedure Laterality Date  . Abdominal hysterectomy  1976  . Kidney stone surgery  2013  . Av fistula placement  01/20/2012    Procedure: ARTERIOVENOUS (AV) FISTULA CREATION;   Surgeon: Elam Dutch, MD;  Location: [place];  Service: Vascular;  Laterality: Left;  . Hemodialysis catheter  05/25/12    secondary to failed AVF   . Eye surgery      bilateral cataract removed  . Av fistula placement  09/05/2012    Procedure: INSERTION OF ARTERIOVENOUS (AV) GORE-TEX GRAFT ARM;  Surgeon: Elam Dutch, MD;  Location: [facility] OR;  Service: Vascular;  Laterality: Left;  Using 4-47mm x 45 cm Vascular stretch goretex graft  . Ligation of arteriovenous  fistula  09/05/2012    Procedure: LIGATION OF ARTERIOVENOUS  FISTULA;  Surgeon: Elam Dutch, MD;  Location: [place];  Service: Vascular;  Laterality: Left;  . Ligation arteriovenous gortex graft  09/05/2012    Procedure: LIGATION ARTERIOVENOUS GORTEX GRAFT;  Surgeon: Elam Dutch, MD;  Location: [place];  Service: Vascular;  Laterality: Left;  . Av fistula placement Right 11/07/2012    Procedure: ARTERIOVENOUS (AV) FISTULA CREATION;  Surgeon: Elam Dutch, MD;  Location: [facility] OR;  Service: Vascular;  Laterality: Right;  Bascilic Vein Fistula  . Appendectomy    . Carpal tunnel release Left 02/10/2013    Procedure: CARPAL TUNNEL RELEASE;  Surgeon: Cammie Sickle., MD;  Location: [place];  Service:  Orthopedics;  Laterality: Left;  . Bascilic vein transposition Right 03/15/2013    Procedure: 2ND STAGE BASCILIC VEIN TRANSPOSITION - RIGHT ARM;  Surgeon: Elam Dutch, MD;  Location: [place];  Service: Vascular;  Laterality: Right;  . Av fistula placement Right 05/15/2013    Procedure: INSERTION OF ARTERIOVENOUS (AV) GORE-TEX GRAFT ARM-RIGHT;  Surgeon: Elam Dutch, MD;  Location: [place];  Service: Vascular;  Laterality: Right;  . Shuntogram N/A 07/27/2012    Procedure: Earney Mallet;  Surgeon: Rosetta Posner, MD;  Location: [facility] CATH LAB;  Service: Cardiovascular;  Laterality: N/A;   Family History  Problem Relation Age of Onset  . Diabetes Mother   . Cancer Father   . Deep vein thrombosis Brother   . Hypertension  Brother    History  Substance Use Topics  . Smoking status: Never Smoker   . Smokeless tobacco: Never Used  . Alcohol Use: No   OB History    No data available     Review of Systems  Unable to perform ROS  level V caveat applies to the review of systems due to the patient's dementia.    Allergies  Review of patient's allergies indicates no known allergies.  Home Medications   Prior to Admission medications   Medication Sig Start Date End Date Taking? Authorizing Provider  albuterol (PROVENTIL) (2.5 MG/3ML) 0.083% nebulizer solution Take 2.5 mg by nebulization every 6 (six) hours as needed for wheezing or shortness of breath.   Yes Historical Provider, MD  amLODipine (NORVASC) 2.5 MG tablet Take 1.25 mg by mouth daily.   Yes Historical Provider, MD  aspirin EC 81 MG tablet Take 81 mg by mouth daily.   Yes Historical Provider, MD  Azelastine HCl 0.15 % SOLN Place 1 spray into both nostrils daily.  10/10/14  Yes Historical Provider, MD  beclomethasone (QVAR) 80 MCG/ACT inhaler Inhale 2 puffs into the lungs 2 (two) times daily.   Yes Historical Provider, MD  calcium acetate (PHOSLO) 667 MG capsule Take 667 mg by mouth 2 (two) times daily before a meal.    Yes Historical Provider, MD  Docusate Calcium (STOOL SOFTENER PO) Take 1 tablet by mouth as needed (for constipation).   Yes Historical Provider, MD  gabapentin (NEURONTIN) 300 MG capsule Take 300 mg by mouth at bedtime.   Yes Historical Provider, MD  HYDROcodone-acetaminophen (NORCO) 10-325 MG per tablet Take 1 tablet by mouth every 6 (six) hours as needed for severe pain. 10/15/14  Yes Carmin Muskrat, MD  multivitamin (RENA-VIT) TABS tablet Take 1 tablet by mouth daily.   Yes Historical Provider, MD  nitroGLYCERIN (NITROSTAT) 0.4 MG SL tablet Place 0.4 mg under the tongue every 5 (five) minutes as needed for chest pain.   Yes Historical Provider, MD  Nutritional Supplements (FEEDING SUPPLEMENT, NEPRO CARB STEADY,) LIQD Take 237 mLs  by mouth as needed (missed meal during dialysis.). 11/20/14  Yes Belkys A Regalado, MD  pantoprazole (PROTONIX) 40 MG tablet Take 40 mg by mouth daily.   Yes Historical Provider, MD  polyethylene glycol (MIRALAX / GLYCOLAX) packet Take 17 g by mouth daily.   Yes Historical Provider, MD  simvastatin (ZOCOR) 5 MG tablet Take 5 mg by mouth at bedtime.    Yes Historical Provider, MD  traMADol (ULTRAM) 50 MG tablet Take 50 mg by mouth every 6 (six) hours as needed. for pain 08/28/14  Yes Historical Provider, MD  levofloxacin (LEVAQUIN) 250 MG tablet Take 1 tablet (250 mg total) by mouth daily.  Patient not taking: Reported on 12/25/2014 11/20/14   Belkys A Regalado, MD   BP 157/65 mmHg  Pulse 122  Temp(Src) 101 F (38.3 C) (Rectal)  Resp 22  SpO2 91% Physical Exam  Constitutional: She appears well-developed and well-nourished. No distress.  HENT:  Head: Normocephalic and atraumatic.  Mouth/Throat: Oropharynx is clear and moist.  Eyes: Conjunctivae and EOM are normal. Pupils are equal, round, and reactive to light.  Neck: Normal range of motion.  Cardiovascular: Normal rate, regular rhythm and normal heart sounds.   No murmur heard. Pulmonary/Chest: Effort normal and breath sounds normal. No respiratory distress.  Abdominal: Soft. Bowel sounds are normal. There is no tenderness.  Musculoskeletal: Normal range of motion.  AV fistula right upper arm with good thrill  Neurological: She is alert. No cranial nerve deficit. She exhibits normal muscle tone. Coordination normal.  Questionable of lower extremity weakness on the right. Patient not oriented.  Skin: No rash noted.  Nursing note and vitals reviewed.   ED Course  Procedures (including critical care time) Labs Review Labs Reviewed  PROTIME-INR - Abnormal; Notable for the following:    Prothrombin Time 15.6 (*)    All other components within normal limits  CBC - Abnormal; Notable for the following:    WBC 12.6 (*)    RBC 3.58 (*)     Hemoglobin 9.5 (*)    HCT 30.9 (*)    RDW 20.1 (*)    All other components within normal limits  DIFFERENTIAL - Abnormal; Notable for the following:    Neutrophils Relative % 79 (*)    Neutro Abs 9.8 (*)    All other components within normal limits  COMPREHENSIVE METABOLIC PANEL - Abnormal; Notable for the following:    Chloride 93 (*)    Glucose, Bld 117 (*)    Creatinine, Ser 9.32 (*)    Albumin 2.7 (*)    GFR calc non Af Amer 3 (*)    GFR calc Af Amer 4 (*)    All other components within normal limits  I-STAT CHEM 8, ED - Abnormal; Notable for the following:    Chloride 94 (*)    BUN 25 (*)    Creatinine, Ser 9.00 (*)    Glucose, Bld 121 (*)    Calcium, Ion 1.03 (*)    Hemoglobin 11.9 (*)    HCT 35.0 (*)    All other components within normal limits  CULTURE, BLOOD (ROUTINE X 2)  CULTURE, BLOOD (ROUTINE X 2)  URINE CULTURE  ETHANOL  APTT  URINE RAPID DRUG SCREEN (HOSP PERFORMED)  URINALYSIS, ROUTINE W REFLEX MICROSCOPIC  I-STAT TROPOININ, ED  I-STAT TROPOININ, ED  I-STAT CG4 LACTIC ACID, ED    Imaging Review Ct Head Wo Contrast  12/25/2014   CLINICAL DATA:  Right-sided weakness. Symptoms began last night. Difficulty speaking.  EXAM: CT HEAD WITHOUT CONTRAST  TECHNIQUE: Contiguous axial images were obtained from the base of the skull through the vertex without intravenous contrast.  COMPARISON:  11/17/2014  FINDINGS: There is atrophy and chronic small vessel disease changes. No acute intracranial abnormality. Specifically, no hemorrhage, hydrocephalus, mass lesion, acute infarction, or significant intracranial injury. No acute calvarial abnormality.  Extensive mucosal thickening throughout the paranasal sinuses. Mastoid air cells are clear. Orbital soft tissues unremarkable.  IMPRESSION: No acute intracranial abnormality.  Atrophy, chronic microvascular disease.  Chronic sinusitis.   Electronically Signed   By: Rolm Baptise M.D.   On: 12/25/2014 09:24   Mr Brain Lottie Dawson  Contrast  12/25/2014   CLINICAL DATA:  81 year old female with slurred speech and right side weakness since this morning. Initial encounter. Current history of end-stage renal disease on dialysis.  EXAM: MRI HEAD WITHOUT CONTRAST  TECHNIQUE: Multiplanar, multiecho pulse sequences of the brain and surrounding structures were obtained without intravenous contrast.  COMPARISON:  Head CT without contrast 0916 hours today. Brain MRI 11/18/2014 and 11/25/2012.  FINDINGS: Major intracranial vascular flow voids are stable. Stable cerebral volume. No restricted diffusion to suggest acute infarction. No midline shift, mass effect, evidence of mass lesion, ventriculomegaly, extra-axial collection or acute intracranial hemorrhage. Cervicomedullary junction and pituitary are within normal limits.  Patchy and confluent bilateral cerebral Chandler matter T2 and FLAIR hyperintensity, better depicted in 2014 and not significantly changed since that time. Similar Patchy and confluent bilateral deep gray matter T2 hyperintensity, and pons hyperintensity. Cerebellum within normal limits. No cortical encephalomalacia or chronic blood products identified in the brain.  Stable visualized internal auditory structures. Mild mastoid effusion now on the right. Small volume retained secretions in the pharynx.  Chronic sinusitis and polypoid material in the right nasal cavity, mildly progressed since 2014.  Stable orbits soft tissues. Visualized scalp soft tissues are within normal limits.  Increasingly heterogeneous bone marrow signal diffusely including in the upper cervical spine.  IMPRESSION: 1. No acute intracranial abnormality. Stable Patchy and confluent signal abnormality in the brain since 2014, favor small vessel disease related. 2. 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 3. Chronic but progressed paranasal sinusitis and right nasal cavity  polyp(s).   Electronically Signed   By: Genevie Ann M.D.   On: 12/25/2014 11:52   Dg Chest Port 1 View  12/25/2014   CLINICAL DATA:  Dialysis complication, initial encounter.  EXAM: PORTABLE CHEST - 1 VIEW  COMPARISON:  One-view chest 11/17/2014  FINDINGS: The heart is enlarged. Atherosclerotic changes are noted at the aortic arch. Asymmetric right-sided interstitial changes are noted. A right pleural effusion is suspected. Plate late atelectasis or scarring in the left midlung is stable. The visualized soft tissues and bony thorax are unremarkable.  IMPRESSION: 1. Cardiomegaly. 2. There is no asymmetric right-sided interstitial and airspace disease concerning for edema. 3. Probable right pleural effusion and atelectasis. Early infection is included in the differential. 4. Interval clearing of the left lung base. 5. Atherosclerosis.   Electronically Signed   By: San Morelle M.D.   On: 12/25/2014 10:53     EKG Interpretation   Date/Time:  Tuesday December 25 2014 09:34:58 EDT Ventricular Rate:  119 PR Interval:  162 QRS Duration: 113 QT Interval:  378 QTC Calculation: 532 R Axis:   -58 Text Interpretation:  Sinus or ectopic atrial tachycardia Incomplete right  bundle branch block Abnormal R-wave progression, late transition LVH with  IVCD, LAD and secondary repol abnrm Inferior infarct, age indeterminate  Prolonged QT interval Artifact Confirmed by Rogene Houston  MD, Kellon Chalk [phone])  on 12/25/2014 9:40:58 AM      CRITICAL CARE Performed by: Fredia Sorrow Total critical care time: 30 Critical care time was exclusive of separately billable procedures and treating other patients. Critical care was necessary to treat or prevent imminent or life-threatening deterioration. Critical care was time spent personally by me on the following activities: development of treatment plan with patient and/or surrogate as well as nursing, discussions with consultants, evaluation of patient's response to  treatment, examination of patient, obtaining history from patient or surrogate, ordering and  performing treatments and interventions, ordering and review of laboratory studies, ordering and review of radiographic studies, pulse oximetry and re-evaluation of patient's condition.  MDM   Final diagnoses:  Dialysis complication, initial encounter  Sepsis, due to unspecified organism    Patient brought in by EMS arrived as a code stroke. Seen by me along with neurology upon arrival code stroke orders placed head CT was negative ordered MRI. An vital signs started to point of concern of possible infectious process. Temp from 100.2 up to 101. Patient with persistent tachycardia. Meeting perhaps sepsis criteria. MRI was negative for any evidence of strokes or strokes been ruled out. Patient's overall complaint was more worsening of her baseline mental status and supposedly right-sided weakness but not able to determine that on examination upon arrival. Patient is also a dialysis patient normally dialyzed Tuesdays Thursdays Saturdays. Was dialyzed on Saturday but obviously missed today.  Patient's chest x-ray raises some concerns for developing pneumonia this could be the source of the infection also patient was diagnosed with pneumonia back in mid-March with admission.  Patients alert but obviously confused. Patient denies any complaints at all. Will follow commands.  Patient started on broad-spectrum antibiotics. Patient did not receive the normal of 30 mL of fluid per kilogram a stoma sepsis protocol due to the fact that she is a dialysis patient.   Patients never been hypotensive.   Patient will be admitted by the triad hospitalist service to step down. Call placed for consultation with nephrology.    Fredia Sorrow, MD 12/25/14 1309

## 2014-12-25 NOTE — Consult Note (Signed)
Admission H&P    Chief Complaint: Slurred speech and right-sided weakness.  HPI: Annette Roach is an 81 y.o. female with a history of end-stage renal disease on dialysis, hypertension, hyperlipidemia and diabetes mellitus, brought to the emergency room and code stroke status after being found with slurred speech and weakness on the right side by a visiting nurse this morning. Patient was also noted to be febrile with temperature of 100.2. Patient was last seen well at 1900 on 12/24/2014. He's been taking aspirin daily. CT scan of her head showed no acute intracranial abnormality. Deficits improved since onset. NIH stroke score was 4. Code stroke was canceled due to the amount of elapsed time since last seen well.  LSN: 1900 on 12/24/2014 tPA Given: No: Beyond time window for treatment consideration; rapidly resolving deficits. mRankin:  Past Medical History  Diagnosis Date  . COPD (chronic obstructive pulmonary disease)     emphysema  . Neuropathy   . Hyperlipidemia   . Diabetes mellitus   . Depression   . Anemia   . Shortness of breath   . GERD (gastroesophageal reflux disease)   . Hyperparathyroidism   . Hypertension     sees Dr. Gilford Rile  . Headache(784.0)   . Arthritis   . CAD (coronary artery disease)     sees Dr. Jenne Campus, [facility] cardiology [place], [place]  . 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 Wears glasses   . Wears hearing aid     right  . Presence of surgically created AV shunt for hemodialysis     old lt upper arm out-rt upper arm shunt in  . Anginal pain     Dr Raliegh Ip in [place], [place]  . History of kidney stones   . Constipation   . History of blood transfusion   . Asthma     years ago  . Chronic kidney disease     [facility] [place], [place]- Hemo    Past Surgical History  Procedure Laterality Date  . Abdominal hysterectomy  1976  . Kidney stone surgery  2013  . Av fistula placement  01/20/2012    Procedure: ARTERIOVENOUS (AV)  FISTULA CREATION;  Surgeon: Elam Dutch, MD;  Location: [place];  Service: Vascular;  Laterality: Left;  . Hemodialysis catheter  05/25/12    secondary to failed AVF   . Eye surgery      bilateral cataract removed  . Av fistula placement  09/05/2012    Procedure: INSERTION OF ARTERIOVENOUS (AV) GORE-TEX GRAFT ARM;  Surgeon: Elam Dutch, MD;  Location: [facility] OR;  Service: Vascular;  Laterality: Left;  Using 4-81mm x 45 cm Vascular stretch goretex graft  . Ligation of arteriovenous  fistula  09/05/2012    Procedure: LIGATION OF ARTERIOVENOUS  FISTULA;  Surgeon: Elam Dutch, MD;  Location: [place];  Service: Vascular;  Laterality: Left;  . Ligation arteriovenous gortex graft  09/05/2012    Procedure: LIGATION ARTERIOVENOUS GORTEX GRAFT;  Surgeon: Elam Dutch, MD;  Location: [place];  Service: Vascular;  Laterality: Left;  . Av fistula placement Right 11/07/2012    Procedure: ARTERIOVENOUS (AV) FISTULA CREATION;  Surgeon: Elam Dutch, MD;  Location: [facility] OR;  Service: Vascular;  Laterality: Right;  Bascilic Vein Fistula  . Appendectomy    . Carpal tunnel release Left 02/10/2013    Procedure: CARPAL TUNNEL RELEASE;  Surgeon: Cammie Sickle., MD;  Location: [place];  Service:  Orthopedics;  Laterality: Left;  . Bascilic vein transposition Right 03/15/2013    Procedure: 2ND STAGE BASCILIC VEIN TRANSPOSITION - RIGHT ARM;  Surgeon: Elam Dutch, MD;  Location: [place];  Service: Vascular;  Laterality: Right;  . Av fistula placement Right 05/15/2013    Procedure: INSERTION OF ARTERIOVENOUS (AV) GORE-TEX GRAFT ARM-RIGHT;  Surgeon: Elam Dutch, MD;  Location: [place];  Service: Vascular;  Laterality: Right;  . Shuntogram N/A 07/27/2012    Procedure: Earney Mallet;  Surgeon: Rosetta Posner, MD;  Location: [facility] CATH LAB;  Service: Cardiovascular;  Laterality: N/A;    Family History  Problem Relation Age of Onset  . Diabetes Mother   . Cancer Father   . Deep vein thrombosis Brother    . Hypertension Brother    Social History:  reports that she has never smoked. She has never used smokeless tobacco. She reports that she does not drink alcohol or use illicit drugs.  Allergies: No Known Allergies  Medications: Patient's preadmission medications were reviewed by me.  ROS: History obtained from the patient  General ROS: negative for - chills, fatigue, fever, night sweats, weight gain or weight loss Psychological ROS: negative for - behavioral disorder, hallucinations, memory difficulties, mood swings or suicidal ideation Ophthalmic ROS: negative for - blurry vision, double vision, eye pain or loss of vision ENT ROS: negative for - epistaxis, nasal discharge, oral lesions, sore throat, tinnitus or vertigo Allergy and Immunology ROS: negative for - hives or itchy/watery eyes Hematological and Lymphatic ROS: negative for - bleeding problems, bruising or swollen lymph nodes Endocrine ROS: negative for - galactorrhea, hair pattern changes, polydipsia/polyuria or temperature intolerance Respiratory ROS: negative for - cough, hemoptysis, shortness of breath or wheezing Cardiovascular ROS: negative for - chest pain, dyspnea on exertion, edema or irregular heartbeat Gastrointestinal ROS: negative for - abdominal pain, diarrhea, hematemesis, nausea/vomiting or stool incontinence Genito-Urinary ROS: negative for - dysuria, hematuria, incontinence or urinary frequency/urgency Musculoskeletal ROS: negative for - joint swelling or muscular weakness Neurological ROS: as noted in HPI Dermatological ROS: negative for rash and skin lesion changes  Physical Examination: Blood pressure 166/70, pulse 121, temperature 100.2 F (37.9 C), temperature source Oral, resp. rate 18, SpO2 92 %.  HEENT-  Normocephalic, no lesions, without obvious abnormality.  Normal external eye and conjunctiva.  Normal TM's bilaterally.  Normal auditory canals and external ears. Normal external nose, mucus  membranes and septum.  Normal pharynx. Neck supple with no masses, nodes, nodules or enlargement. Cardiovascular - regular rate and rhythm, S1, S2 normal, no murmur, click, rub or gallop Lungs - chest clear, no wheezing, rales, normal symmetric air entry Abdomen - soft, non-tender; bowel sounds normal; no masses,  no organomegaly Extremities - no joint deformities, effusion, or inflammation and no edema  Neurologic Examination: Mental Status: Alert, oriented, thought content appropriate.  Speech was slightly slurred without evidence of aphasia. Able to follow commands without difficulty. Cranial Nerves: II-Visual fields were normal. III/IV/VI-Pupils were equal and reacted. Extraocular movements were full and conjugate.    V/VII-no facial numbness and no facial weakness. VIII-normal. X-mild dysarthria. XI: trapezius strength/neck flexion strength normal bilaterally XII-midline tongue extension with normal strength. Motor: No weakness of upper extremities noted; mild drift of left lower extremity and moderate drift of right lower extremity noted; motor exam otherwise unremarkable except for bilateral asterixis with extension of upper extremities. Sensory: Normal throughout. Deep Tendon Reflexes: Trace to 1+ and symmetric. Plantars: Mute bilaterally Cerebellar: Normal finger-to-nose testing. Carotid auscultation: Normal  No  results found for this or any previous visit (from the past 26 hour(s)). Ct Head Wo Contrast  12/25/2014   CLINICAL DATA:  Right-sided weakness. Symptoms began last night. Difficulty speaking.  EXAM: CT HEAD WITHOUT CONTRAST  TECHNIQUE: Contiguous axial images were obtained from the base of the skull through the vertex without intravenous contrast.  COMPARISON:  11/17/2014  FINDINGS: There is atrophy and chronic small vessel disease changes. No acute intracranial abnormality. Specifically, no hemorrhage, hydrocephalus, mass lesion, acute infarction, or significant  intracranial injury. No acute calvarial abnormality.  Extensive mucosal thickening throughout the paranasal sinuses. Mastoid air cells are clear. Orbital soft tissues unremarkable.  IMPRESSION: No acute intracranial abnormality.  Atrophy, chronic microvascular disease.  Chronic sinusitis.   Electronically Signed   By: Rolm Baptise M.D.   On: 12/25/2014 09:24    Assessment: 81 y.o. female with multiple risk factors for stroke presenting with low-grade fever as well as apparent transient right-sided weakness and resolving dysarthria. TIA or acute stroke cannot be ruled out at this point.  Stroke Risk Factors - diabetes mellitus, hyperlipidemia and hypertension  Plan: 1. HgbA1c, fasting lipid panel 2. MRI, MRA  of the brain without contrast 3. PT consult, OT consult, Speech consult 4. Echocardiogram 5. Carotid dopplers 6. Prophylactic therapy-Antiplatelet med: Aspirin  7. Risk factor modification 8. Dialysis due today  C.R. Nicole Kindred, MD [facility] [phone]  12/25/2014, 9:46 AM

## 2014-12-25 NOTE — ED Notes (Signed)
Patient doesn't make any urine, informed by Patient and daughter.

## 2014-12-25 NOTE — ED Notes (Signed)
Pt brought in via [facility]. Pt was LSN last night at 1900 with grandson. Pt has a home health CNA that came this morning and noticed pt was unable to ambulate and more confused. Pt is a HD pt and goes for dialysis on T, Th, Sat. Pt is able to follow commands but has some right sided weakness. Pt has a history of dementia, diabetes, hypertension and a right AV graft. EMS V/S B/P 170/70, HR 120, T 100.5, SPO2 100%, CBG 130.

## 2014-12-26 DIAGNOSIS — G9340 Encephalopathy, unspecified: Secondary | ICD-10-CM

## 2014-12-26 DIAGNOSIS — E785 Hyperlipidemia, unspecified: Secondary | ICD-10-CM

## 2014-12-26 DIAGNOSIS — R509 Fever, unspecified: Secondary | ICD-10-CM

## 2014-12-26 LAB — BASIC METABOLIC PANEL
ANION GAP: 13 (ref 5–15)
BUN: 10 mg/dL (ref 6–23)
CHLORIDE: 95 mmol/L — AB (ref 96–112)
CO2: 27 mmol/L (ref 19–32)
Calcium: 9.1 mg/dL (ref 8.4–10.5)
Creatinine, Ser: 4.97 mg/dL — ABNORMAL HIGH (ref 0.50–1.10)
GFR calc Af Amer: 8 mL/min — ABNORMAL LOW (ref >90)
GFR, EST NON AFRICAN AMERICAN: 7 mL/min — AB (ref >90)
Glucose, Bld: 143 mg/dL — ABNORMAL HIGH (ref 70–99)
POTASSIUM: 4 mmol/L (ref 3.5–5.1)
SODIUM: 135 mmol/L (ref 135–145)

## 2014-12-26 LAB — INFLUENZA PANEL BY PCR (TYPE A & B)
H1N1 flu by pcr: NOT DETECTED
INFLBPCR: NEGATIVE
Influenza A By PCR: NEGATIVE

## 2014-12-26 LAB — GLUCOSE, CAPILLARY
GLUCOSE-CAPILLARY: 122 mg/dL — AB (ref 70–99)
Glucose-Capillary: 119 mg/dL — ABNORMAL HIGH (ref 70–99)

## 2014-12-26 LAB — LIPID PANEL
Cholesterol: 137 mg/dL (ref 0–200)
HDL: 30 mg/dL — ABNORMAL LOW (ref >39)
LDL Cholesterol: 83 mg/dL (ref 0–99)
Total CHOL/HDL Ratio: 4.6 RATIO
Triglycerides: 119 mg/dL (ref <150)
VLDL: 24 mg/dL (ref 0–40)

## 2014-12-26 LAB — CBC
HEMATOCRIT: 33.1 % — AB (ref 36.0–46.0)
Hemoglobin: 10.1 g/dL — ABNORMAL LOW (ref 12.0–15.0)
MCH: 26.8 pg (ref 26.0–34.0)
MCHC: 30.5 g/dL (ref 30.0–36.0)
MCV: 87.8 fL (ref 78.0–100.0)
Platelets: 345 10*3/uL (ref 150–400)
RBC: 3.77 MIL/uL — AB (ref 3.87–5.11)
RDW: 20.5 % — ABNORMAL HIGH (ref 11.5–15.5)
WBC: 14.2 10*3/uL — AB (ref 4.0–10.5)

## 2014-12-26 LAB — HIV ANTIBODY (ROUTINE TESTING W REFLEX): HIV Screen 4th Generation wRfx: NONREACTIVE

## 2014-12-26 MED ORDER — STROKE: EARLY STAGES OF RECOVERY BOOK
Freq: Once | Status: DC
  Filled 2014-12-26: qty 1

## 2014-12-26 MED ORDER — PNEUMOCOCCAL VAC POLYVALENT 25 MCG/0.5ML IJ INJ
0.5000 mL | INJECTION | INTRAMUSCULAR | Status: AC
  Administered 2014-12-27: 0.5 mL via INTRAMUSCULAR
  Filled 2014-12-26: qty 0.5

## 2014-12-26 MED ORDER — BENZONATATE 100 MG PO CAPS
200.0000 mg | ORAL_CAPSULE | Freq: Three times a day (TID) | ORAL | Status: DC | PRN
  Filled 2014-12-26: qty 2

## 2014-12-26 NOTE — Progress Notes (Signed)
[place] TEAM 1 - Stepdown/ICU TEAM Progress Note  Annette Roach MWU:[id number] DOB: 09/20/1927 DOA: 12/25/2014 PCP: Gilford Rile, MD  Admit HPI / Brief Narrative: 81 year old woman with history significant of end-stage renal disease on hemodialysis Tue/Thur/Sat, coronary artery disease, hyperlipidemia, COPD, and stomach cancer who was brought to the hospital due to confusion. Patient had recently been hospitalized for pneumonia and given 3 days of vancomycin and Zosyn and later transitioned to Levaquin for discharge. EMS was called and she was noted to have right greater than left extremity weakness and therefore a code stroke was called. Subsequent workup was negative for CVA including MRI and CT of the head.   In the emergency department she was found to be febrile with a temp of 101. She was tachycardic into the 120s, leukocytosis of 12.6, chest x-ray with right pleural effusion.   HPI/Subjective: The patient is sitting up in a bedside chair in good spirits.  She states she feels much better.  She has no complaints this time stating she does not have chest pain, abdominal pain, nausea vomiting, or shortness of breath.  When further questioned she does admit to a nagging intermittent dry cough.  Assessment/Plan:  Acute toxic metabolic encephalopathy -MRI confirmed there has not been a CVA -Suspect related to sepsis - appears to have returned to her baseline  Sepsis due to right lower lobe healthcare associated pneumonia -Continue empiric antibiotic therapy - clinically the patient is improving - sepsis physiology has essentially resolved and the patient is presently hemodynamically stable  End-stage renal disease -On hemodialysis Tuesday Thursday Saturday. -Nephrology is following  Hypertension -Blood pressure is currently well-controlled  Anemia of chronic kidney disease -Hemoglobin stable - care per nephrology  Reported history of diabetes -Patient is on no diabetic medications  at home - A1c was 5.0 February 2016 - no indication to follow CBGs presently  Code Status: FULL Family Communication: no family present at time of exam Disposition Plan: SDU  Consultants: Nephrology Neurology  Procedures: None  Antibiotics: Cefepime 4/26 > Vancomycin 4/26 > Zosyn 4/26  DVT prophylaxis: Subcutaneous heparin  Objective: Blood pressure 134/58, pulse 94, temperature 97.7 F (36.5 C), temperature source Axillary, resp. rate 20, height 5\' 2"  (1.575 m), weight 68.2 kg (150 lb 5.7 oz), SpO2 100 %.  Intake/Output Summary (Last 24 hours) at 12/26/14 1621 Last data filed at 12/26/14 1400  Gross per 24 hour  Intake    530 ml  Output   2197 ml  Net  -1667 ml   Exam: General: No acute respiratory distress at rest in bedside chair Lungs: Right basilar crackles with no wheeze and good air movement throughout other fields Cardiovascular: Regular rate and rhythm without murmur gallop or rub normal S1 and S2 Abdomen: Nontender, nondistended, soft, bowel sounds positive, no rebound, no ascites, no appreciable mass Extremities: No significant cyanosis, clubbing, or edema bilateral lower extremities  Data Reviewed: Basic Metabolic Panel:  Recent Labs Lab 12/25/14 0913 12/25/14 0950 12/26/14 0248  NA 137 137 135  K 4.5 4.6 4.0  CL 93* 94* 95*  CO2 30  --  27  GLUCOSE 117* 121* 143*  BUN 20 25* 10  CREATININE 9.32* 9.00* 4.97*  CALCIUM 9.0  --  9.1    Liver Function Tests:  Recent Labs Lab 12/25/14 0913  AST 16  ALT 8  ALKPHOS 97  BILITOT 0.7  PROT 8.3  ALBUMIN 2.7*    Coags:  Recent Labs Lab 12/25/14 0913  INR 1.23  Recent Labs Lab 12/25/14 0913  APTT 32    CBC:  Recent Labs Lab 12/25/14 0913 12/25/14 0950 12/26/14 0248  WBC 12.6*  --  14.2*  NEUTROABS 9.8*  --   --   HGB 9.5* 11.9* 10.1*  HCT 30.9* 35.0* 33.1*  MCV 86.3  --  87.8  PLT 378  --  345    CBG:  Recent Labs Lab 12/26/14 0059 12/26/14 0816  GLUCAP 122*  119*    Recent Results (from the past 240 hour(s))  Blood Culture (routine x 2)     Status: None (Preliminary result)   Collection Time: 12/25/14  1:30 PM  Result Value Ref Range Status   Specimen Description BLOOD LEFT WRIST  Final   Special Requests BOTTLES DRAWN AEROBIC AND ANAEROBIC 5CC  Final   Culture   Final           BLOOD CULTURE RECEIVED NO GROWTH TO DATE CULTURE WILL BE HELD FOR 5 DAYS BEFORE ISSUING A FINAL NEGATIVE REPORT Performed at [facility]    Report Status PENDING  Incomplete  Blood Culture (routine x 2)     Status: None (Preliminary result)   Collection Time: 12/25/14  1:55 PM  Result Value Ref Range Status   Specimen Description BLOOD HAND LEFT  Final   Special Requests BOTTLES DRAWN AEROBIC AND ANAEROBIC 5CC  Final   Culture   Final           BLOOD CULTURE RECEIVED NO GROWTH TO DATE CULTURE WILL BE HELD FOR 5 DAYS BEFORE ISSUING A FINAL NEGATIVE REPORT Performed at [facility]    Report Status PENDING  Incomplete  MRSA PCR Screening     Status: None   Collection Time: 12/25/14  6:34 PM  Result Value Ref Range Status   MRSA by PCR NEGATIVE NEGATIVE Final    Comment:        The GeneXpert MRSA Assay (FDA approved for NASAL specimens only), is one component of a comprehensive MRSA colonization surveillance program. It is not intended to diagnose MRSA infection nor to guide or monitor treatment for MRSA infections.      Studies:   Recent x-ray studies have been reviewed in detail by the Attending Physician  Scheduled Meds:  Scheduled Meds: . amLODipine  1.25 mg Oral Daily  . aspirin EC  81 mg Oral Daily  . Azelastine HCl  1 spray Each Nare Daily  . budesonide (PULMICORT) nebulizer solution  0.5 mg Nebulization BID  . calcium acetate  667 mg Oral BID AC  . ceFEPime (MAXIPIME) IV  2 g Intravenous Q T,Th,Sa-HD  . gabapentin  300 mg Oral QHS  . heparin  5,000 Units Subcutaneous 3 times per day  . multivitamin  1 tablet Oral Daily   . pantoprazole  40 mg Oral Daily  . [START ON 12/27/2014] pneumococcal 23 valent vaccine  0.5 mL Intramuscular Tomorrow-1000  . polyethylene glycol  17 g Oral Daily  . simvastatin  5 mg Oral QHS  . sodium chloride  3 mL Intravenous Q12H  . vancomycin  750 mg Intravenous Q T,Th,Sa-HD    Time spent on care of this patient: 35 mins   MCCLUNG,JEFFREY T , MD   [facility] Office  [phone] Pager - Text Page per Shea Evans as per below:  On-Call/Text Page:      [email or web address]      password [email or web address]  If 7PM-7AM, please contact night-coverage [email or web address] Password [email or web address] 12/26/2014, 4:21 PM   LOS: 1  day

## 2014-12-26 NOTE — Progress Notes (Signed)
Dialysis called and got report on this pt before I got a chance to assess her.

## 2014-12-26 NOTE — Progress Notes (Signed)
Daughter visited in PM. RN reviewed stroke booklet per order (pertaining to TIA symptoms).

## 2014-12-26 NOTE — Progress Notes (Signed)
Patient ID: Annette Roach, female   DOB: 1927-10-19, 81 y.o.   MRN: [id number]  [facility] Progress Note   Assessment/ Plan:   1. Altered mental status: Suspected to be from acute illness delirium-likely pneumonia. CT scan and MRI of the brain did not show CVA. Overnight-events noted and she was a little more unresponsive towards the end of dialysis probably associated with hypotension and perked up with intravenous fluids. At this point, she is less responsive (compared to my contact with her in the emergency room yesterday afternoon)-I'm not sure whether this represents her poor sleep overnight following dialysis or whether this is still encephalopathy. 2. Pneumonia with possible sepsis: On proximal spectrum antibiotic coverage for healthcare associated pneumonia given recent hospitalization. Blood cultures and urine cultures are currently pending. 3. ESRD: Hemodialysis done yesterday-she got her nearly full treatment and she does not have any acute indications for dialysis again today. 4. Anemia of chronic kidney disease: Hemoglobin currently at goal, no indications for ESA at this time. 5. Metabolic bone disease: Resumed phosphorus binders (with meals) as well as calcitriol at dialysis.  Subjective:   Events from overnight noted-dialysis stopped 11 minutes early as the patient became hypotensive/unresponsive but improved saline bolus. Overnight, tachycardic and febrile    Objective:   BP 143/57 mmHg  Pulse 105  Temp(Src) 98.5 F (36.9 C) (Oral)  Resp 26  Ht 5\' 2"  (1.575 m)  Wt 68.2 kg (150 lb 5.7 oz)  BMI 27.49 kg/m2  SpO2 97%  Physical Exam: Gen: Somnolent, awakens to voice but nonresponsive to any question CVS: Pulse regular tachycardia, S1 and S2 normal Resp: Coarse breath sounds bilaterally-no distinct rales Abd: Soft, obese, nontender Ext: No lower extremity edema  Labs: BMET  Recent Labs Lab 12/25/14 0913 12/25/14 0950 12/26/14 0248  NA 137 137 135  K  4.5 4.6 4.0  CL 93* 94* 95*  CO2 30  --  27  GLUCOSE 117* 121* 143*  BUN 20 25* 10  CREATININE 9.32* 9.00* 4.97*  CALCIUM 9.0  --  9.1   CBC  Recent Labs Lab 12/25/14 0913 12/25/14 0950 12/26/14 0248  WBC 12.6*  --  14.2*  NEUTROABS 9.8*  --   --   HGB 9.5* 11.9* 10.1*  HCT 30.9* 35.0* 33.1*  MCV 86.3  --  87.8  PLT 378  --  345   Medications:    . amLODipine  1.25 mg Oral Daily  . aspirin EC  81 mg Oral Daily  . Azelastine HCl  1 spray Each Nare Daily  . budesonide (PULMICORT) nebulizer solution  0.5 mg Nebulization BID  . calcium acetate  667 mg Oral BID AC  . ceFEPime (MAXIPIME) IV  2 g Intravenous Q T,Th,Sa-HD  . gabapentin  300 mg Oral QHS  . heparin  5,000 Units Subcutaneous 3 times per day  . multivitamin  1 tablet Oral Daily  . pantoprazole  40 mg Oral Daily  . polyethylene glycol  17 g Oral Daily  . simvastatin  5 mg Oral QHS  . sodium chloride  3 mL Intravenous Q12H  . vancomycin  750 mg Intravenous Q T,Th,Sa-HD  . [DISCONTINUED] sodium chloride   Intravenous STAT   Elmarie Shiley, MD 12/26/2014, 8:12 AM

## 2014-12-26 NOTE — Progress Notes (Signed)
STROKE TEAM PROGRESS NOTE   SUBJECTIVE (INTERVAL HISTORY) No family is at the bedside.  Overall she feels her condition is rapidly improving. She is orientated and conversing well. No focal weakness seen. She had HD over night. This morning still febrile at 101.6.   OBJECTIVE Temp:  [97.4 F (36.3 C)-101.6 F (38.7 C)] 97.4 F (36.3 C) (04/27 0814) Pulse Rate:  [64-122] 94 (04/27 0814) Cardiac Rhythm:  [-] Sinus tachycardia (04/27 0400) Resp:  [18-34] 26 (04/27 0814) BP: (119-166)/(46-92) 144/59 mmHg (04/27 0814) SpO2:  [89 %-100 %] 99 % (04/27 0914) Weight:  [150 lb 5.7 oz (68.2 kg)-156 lb 8.4 oz (71 kg)] 150 lb 5.7 oz (68.2 kg) (04/27 0054)   Recent Labs Lab 12/26/14 0059 12/26/14 0816  GLUCAP 122* 119*    Recent Labs Lab 12/25/14 0913 12/25/14 0950 12/26/14 0248  NA 137 137 135  K 4.5 4.6 4.0  CL 93* 94* 95*  CO2 30  --  27  GLUCOSE 117* 121* 143*  BUN 20 25* 10  CREATININE 9.32* 9.00* 4.97*  CALCIUM 9.0  --  9.1    Recent Labs Lab 12/25/14 0913  AST 16  ALT 8  ALKPHOS 97  BILITOT 0.7  PROT 8.3  ALBUMIN 2.7*    Recent Labs Lab 12/25/14 0913 12/25/14 0950 12/26/14 0248  WBC 12.6*  --  14.2*  NEUTROABS 9.8*  --   --   HGB 9.5* 11.9* 10.1*  HCT 30.9* 35.0* 33.1*  MCV 86.3  --  87.8  PLT 378  --  345   No results for input(s): CKTOTAL, CKMB, CKMBINDEX, TROPONINI in the last 168 hours.  Recent Labs  12/25/14 0913  LABPROT 15.6*  INR 1.23    Recent Labs  12/25/14 1517  COLORURINE YELLOW  LABSPEC 1.009  PHURINE 8.0  GLUCOSEU NEGATIVE  HGBUR TRACE*  BILIRUBINUR NEGATIVE  KETONESUR NEGATIVE  PROTEINUR 100*  UROBILINOGEN 0.2  NITRITE NEGATIVE  LEUKOCYTESUR SMALL*    No results found for: CHOL, TRIG, HDL, CHOLHDL, VLDL, LDLCALC Lab Results  Component Value Date   HGBA1C 5.0 10/28/2014      Component Value Date/Time   LABOPIA NONE DETECTED 12/25/2014 1517   COCAINSCRNUR NONE DETECTED 12/25/2014 1517   LABBENZ NONE DETECTED  12/25/2014 1517   AMPHETMU NONE DETECTED 12/25/2014 1517   THCU NONE DETECTED 12/25/2014 1517   LABBARB NONE DETECTED 12/25/2014 1517     Recent Labs Lab 12/25/14 0913  ETH <5    I have personally reviewed the radiological images below and agree with the radiology interpretations.  Ct Head Wo Contrast  12/25/2014   IMPRESSION: No acute intracranial abnormality.  Atrophy, chronic microvascular disease.  Chronic sinusitis.      Mr Brain Wo Contrast  12/25/2014   IMPRESSION: 1. No acute intracranial abnormality. Stable Patchy and confluent signal abnormality in the brain since 2014, favor small vessel disease related. 2. 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 3. Chronic but progressed paranasal sinusitis and right nasal cavity polyp(s).      Dg Chest Port 1 View  12/25/2014   IMPRESSION: 1. Cardiomegaly. 2. There is no asymmetric right-sided interstitial and airspace disease concerning for edema. 3. Probable right pleural effusion and atelectasis. Early infection is included in the differential. 4. Interval clearing of the left lung base. 5. Atherosclerosis.     Carotid Doppler  pending  2D Echocardiogram  11/14/14 - Left ventricle: The cavity size was  normal. There was moderate concentric hypertrophy. Systolic function was mildly reduced. The estimated ejection fraction was in the range of 45% to 50%. Possible hypokinesis of the basalinferolateral and inferior myocardium. There was fusion of early and atrial contributions to ventricular filling due to delayed AV conduction, The study is not technically sufficient to allow evaluation of LV diastolic function. - Ventricular septum: Septal motion showed abnormal function and dyssynergy. These changes are consistent with intraventricular conduction delay. - Aortic valve: There was mild regurgitation. - Mitral valve: There  was mild regurgitation directed centrally. Diastolic regurgitation was present. - Left atrium: The atrium was mildly dilated. - Right ventricle: Systolic function was mildly reduced. - Pericardium, extracardiac: A small pericardial effusion was identified circumferential to the heart. There was no evidence of hemodynamic compromise.  EEG 11/20/14 - this is a normal awake and asleep EEG. Please, be  aware that a normal EEG does not exclude the possibility of  epilepsy.   PHYSICAL EXAM  Temp:  [97.4 F (36.3 C)-101.6 F (38.7 C)] 97.4 F (36.3 C) (04/27 0814) Pulse Rate:  [64-122] 94 (04/27 0814) Resp:  [18-34] 26 (04/27 0814) BP: (119-166)/(46-92) 144/59 mmHg (04/27 0814) SpO2:  [89 %-100 %] 99 % (04/27 0914) Weight:  [150 lb 5.7 oz (68.2 kg)-156 lb 8.4 oz (71 kg)] 150 lb 5.7 oz (68.2 kg) (04/27 0054)  General - Well nourished, well developed, in no apparent distress.  Ophthalmologic - fundi not visualized due to eye movement.  Cardiovascular - Regular rate and rhythm.  Mental Status -  Level of arousal and orientation to time, place, and person were intact. Language including expression, naming, repetition, comprehension was assessed and found intact. Fund of Knowledge was assessed and was impaired with previous president.  Cranial Nerves II - XII - II - Visual field intact OU. III, IV, VI - Extraocular movements intact. V - Facial sensation intact bilaterally. VII - Facial movement intact bilaterally. VIII - Hearing & vestibular intact bilaterally. X - Palate elevates symmetrically. XI - Chin turning & shoulder shrug intact bilaterally. XII - Tongue protrusion intact.  Motor Strength - The patient's strength was 5/5 BUEs but 4/5 BLEs proximal and 5/5 distal and pronator drift was absent.  Bulk was normal and fasciculations were absent.   Motor Tone - Muscle tone was assessed at the neck and appendages and was normal.  Reflexes - The patient's reflexes were 1+ in  all extremities and she had no pathological reflexes.  Sensory - Light touch, temperature/pinprick were assessed and were symmetrical.    Coordination - The patient had normal movements in the hands with no ataxia or dysmetria, but slow.  Tremor was absent.  Gait and Station - need assistance to standup and barely move both feet.   ASSESSMENT/PLAN Ms. Annette Roach is a 81 y.o. female with history of ESRD on HD, HTN, HLD, and DM admitted for slurry speech and ? Right side weakness. Also found to have fever but on today's exam, no focal deficit. MRI negative for stroke. Symptoms rapid improving.    AMS - likely due to fever and ? Infection as well as deconditioning  MRI  No acute infarct  Carotid Doppler  Pending   2D Echo  EF 45-50% in 10/2014  LDL pending  HgbA1c 5.0 in 10/2014  Heparin subq for VTE prophylaxis  Diet renal with fluid restriction Fluid restriction:: 1200 mL Fluid; Room service appropriate?: Yes; Fluid consistency:: Thin   aspirin 81 mg orally every day prior to admission,  now on aspirin 81 mg orally every day. Continue antiplatelet  Ongoing aggressive stroke risk factor management  Therapy recommendations:  pending  Disposition:  pending  Diabetes  HgbA1c 5.0 in 10/2014, goal < 7.0  Controlled  CBG monitoring  Hypertension  Home meds:   amlodipine Currently on amlodipine  Stable  Patient counseled to be compliant with her blood pressure medications  Hyperlipidemia  Home meds:  simvastatin   Currently on simvastatin  LDL pending, goal < 70  Continue statin at discharge  Fever  Working on infection source  Blood culture pending  Urine culture pending  WBC 14.2  On cefepime and vanco  Other Stroke Risk Factors  Advanced age  Other Active Problems  ESRD on HD  Anemia  leukocystosis  Other Pertinent History  Lives alone, has nursing aide M-F  Hospital day # 1  Rosalin Hawking, MD PhD Stroke Neurology 12/26/2014 9:18  AM    To contact Stroke Continuity provider, please refer to [email or web address] After hours, contact General Neurology

## 2014-12-26 NOTE — Progress Notes (Signed)
Pt OOB in chair throughout AM, sleeping. Arousable : A X 3. Some confusion r/t sequence of events leading to admission. Daughter updated on POC.Willl continue to assess for changes in mentation.

## 2014-12-26 NOTE — Progress Notes (Signed)
Hemodialysis- Treatment ended with 11 minutes remaining d/t hypotensive/unresponsive episode. Pt immediately placed in trendelenburg and given 600cc saline. Alert and oriented, "I feel better now." Pt had attempted to sit up in bed to eat snack prior to episode. BP unable to be obtained at incident d/t rapid infusion of saline. BP post rinseback =151/63, HR 100, RR 25, Sats 96% on RA. Report called to primary RN. Vitals at baseline.

## 2014-12-27 ENCOUNTER — Inpatient Hospital Stay (HOSPITAL_COMMUNITY): Payer: Medicare Other

## 2014-12-27 DIAGNOSIS — G459 Transient cerebral ischemic attack, unspecified: Secondary | ICD-10-CM

## 2014-12-27 DIAGNOSIS — D631 Anemia in chronic kidney disease: Secondary | ICD-10-CM | POA: Diagnosis present

## 2014-12-27 DIAGNOSIS — A419 Sepsis, unspecified organism: Secondary | ICD-10-CM | POA: Diagnosis present

## 2014-12-27 DIAGNOSIS — C169 Malignant neoplasm of stomach, unspecified: Secondary | ICD-10-CM | POA: Diagnosis present

## 2014-12-27 DIAGNOSIS — J948 Other specified pleural conditions: Secondary | ICD-10-CM

## 2014-12-27 DIAGNOSIS — J90 Pleural effusion, not elsewhere classified: Secondary | ICD-10-CM | POA: Diagnosis present

## 2014-12-27 DIAGNOSIS — N189 Chronic kidney disease, unspecified: Secondary | ICD-10-CM

## 2014-12-27 DIAGNOSIS — J189 Pneumonia, unspecified organism: Secondary | ICD-10-CM

## 2014-12-27 LAB — RENAL FUNCTION PANEL
ALBUMIN: 2.3 g/dL — AB (ref 3.5–5.2)
ANION GAP: 13 (ref 5–15)
BUN: 40 mg/dL — ABNORMAL HIGH (ref 6–23)
CALCIUM: 8.6 mg/dL (ref 8.4–10.5)
CO2: 27 mmol/L (ref 19–32)
Chloride: 94 mmol/L — ABNORMAL LOW (ref 96–112)
Creatinine, Ser: 7.93 mg/dL — ABNORMAL HIGH (ref 0.50–1.10)
GFR calc non Af Amer: 4 mL/min — ABNORMAL LOW (ref >90)
GFR, EST AFRICAN AMERICAN: 5 mL/min — AB (ref >90)
Glucose, Bld: 103 mg/dL — ABNORMAL HIGH (ref 70–99)
Phosphorus: 4.2 mg/dL (ref 2.3–4.6)
Potassium: 4.1 mmol/L (ref 3.5–5.1)
SODIUM: 134 mmol/L — AB (ref 135–145)

## 2014-12-27 LAB — URINE CULTURE
Colony Count: NO GROWTH
Culture: NO GROWTH

## 2014-12-27 LAB — CBC
HCT: 27.8 % — ABNORMAL LOW (ref 36.0–46.0)
HEMOGLOBIN: 9 g/dL — AB (ref 12.0–15.0)
MCH: 27 pg (ref 26.0–34.0)
MCHC: 32.4 g/dL (ref 30.0–36.0)
MCV: 83.5 fL (ref 78.0–100.0)
Platelets: 361 10*3/uL (ref 150–400)
RBC: 3.33 MIL/uL — AB (ref 3.87–5.11)
RDW: 19.7 % — ABNORMAL HIGH (ref 11.5–15.5)
WBC: 10.4 10*3/uL (ref 4.0–10.5)

## 2014-12-27 NOTE — Procedures (Signed)
I was present at this session.  I have reviewed the session itself and made appropriate changes.  HD via RUA access.  bp 130s to start.    Tkeyah Burkman L 4/28/20164:18 PM

## 2014-12-27 NOTE — Progress Notes (Signed)
STROKE TEAM PROGRESS NOTE   SUBJECTIVE (INTERVAL HISTORY) No family is at the bedside.  She is eating lunch. Afebrile since yesterday. Overall she feels her condition is rapidly improving. She is orientated and conversing well. No focal weakness seen.   OBJECTIVE Temp:  [97.8 F (36.6 C)-98.5 F (36.9 C)] 98 F (36.7 C) (04/28 1256) Pulse Rate:  [93-102] 95 (04/28 1256) Cardiac Rhythm:  [-] Sinus tachycardia (04/28 0900) Resp:  [18-28] 18 (04/28 1256) BP: (110-137)/(44-99) 137/50 mmHg (04/28 1256) SpO2:  [95 %-100 %] 100 % (04/28 1256)   Recent Labs Lab 12/26/14 0059 12/26/14 0816  GLUCAP 122* 119*    Recent Labs Lab 12/25/14 0913 12/25/14 0950 12/26/14 0248  NA 137 137 135  K 4.5 4.6 4.0  CL 93* 94* 95*  CO2 30  --  27  GLUCOSE 117* 121* 143*  BUN 20 25* 10  CREATININE 9.32* 9.00* 4.97*  CALCIUM 9.0  --  9.1    Recent Labs Lab 12/25/14 0913  AST 16  ALT 8  ALKPHOS 97  BILITOT 0.7  PROT 8.3  ALBUMIN 2.7*    Recent Labs Lab 12/25/14 0913 12/25/14 0950 12/26/14 0248  WBC 12.6*  --  14.2*  NEUTROABS 9.8*  --   --   HGB 9.5* 11.9* 10.1*  HCT 30.9* 35.0* 33.1*  MCV 86.3  --  87.8  PLT 378  --  345   No results for input(s): CKTOTAL, CKMB, CKMBINDEX, TROPONINI in the last 168 hours.  Recent Labs  12/25/14 0913  LABPROT 15.6*  INR 1.23    Recent Labs  12/25/14 1517  COLORURINE YELLOW  LABSPEC 1.009  PHURINE 8.0  GLUCOSEU NEGATIVE  HGBUR TRACE*  BILIRUBINUR NEGATIVE  KETONESUR NEGATIVE  PROTEINUR 100*  UROBILINOGEN 0.2  NITRITE NEGATIVE  LEUKOCYTESUR SMALL*       Component Value Date/Time   CHOL 137 12/26/2014 0248   TRIG 119 12/26/2014 0248   HDL 30* 12/26/2014 0248   CHOLHDL 4.6 12/26/2014 0248   VLDL 24 12/26/2014 0248   LDLCALC 83 12/26/2014 0248   Lab Results  Component Value Date   HGBA1C 5.0 10/28/2014      Component Value Date/Time   LABOPIA NONE DETECTED 12/25/2014 1517   COCAINSCRNUR NONE DETECTED 12/25/2014 1517    LABBENZ NONE DETECTED 12/25/2014 1517   AMPHETMU NONE DETECTED 12/25/2014 1517   THCU NONE DETECTED 12/25/2014 1517   LABBARB NONE DETECTED 12/25/2014 1517     Recent Labs Lab 12/25/14 0913  ETH <5    I have personally reviewed the radiological images below and agree with the radiology interpretations.  Ct Head Wo Contrast  12/25/2014   IMPRESSION: No acute intracranial abnormality.  Atrophy, chronic microvascular disease.  Chronic sinusitis.      Mr Brain Wo Contrast  12/25/2014   IMPRESSION: 1. No acute intracranial abnormality. Stable Patchy and confluent signal abnormality in the brain since 2014, favor small vessel disease related. 2. 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 3. Chronic but progressed paranasal sinusitis and right nasal cavity polyp(s).      Dg Chest Port 1 View  12/25/2014   IMPRESSION: 1. Cardiomegaly. 2. There is no asymmetric right-sided interstitial and airspace disease concerning for edema. 3. Probable right pleural effusion and atelectasis. Early infection is included in the differential. 4. Interval clearing of the left lung base. 5. Atherosclerosis.     Carotid Doppler  Bilateral: 1-39% ICA stenosis.  Vertebral artery flow is antegrade.  2D Echocardiogram  11/14/14 - Left ventricle: The cavity size was normal. There was moderate concentric hypertrophy. Systolic function was mildly reduced. The estimated ejection fraction was in the range of 45% to 50%. Possible hypokinesis of the basalinferolateral and inferior myocardium. There was fusion of early and atrial contributions to ventricular filling due to delayed AV conduction, The study is not technically sufficient to allow evaluation of LV diastolic function. - Ventricular septum: Septal motion showed abnormal function and dyssynergy. These changes are consistent with  intraventricular conduction delay. - Aortic valve: There was mild regurgitation. - Mitral valve: There was mild regurgitation directed centrally. Diastolic regurgitation was present. - Left atrium: The atrium was mildly dilated. - Right ventricle: Systolic function was mildly reduced. - Pericardium, extracardiac: A small pericardial effusion was identified circumferential to the heart. There was no evidence of hemodynamic compromise.  EEG 11/20/14 - this is a normal awake and asleep EEG. Please, be  aware that a normal EEG does not exclude the possibility of  epilepsy.   PHYSICAL EXAM  Temp:  [97.8 F (36.6 C)-98.5 F (36.9 C)] 98 F (36.7 C) (04/28 1256) Pulse Rate:  [93-102] 95 (04/28 1256) Resp:  [18-28] 18 (04/28 1256) BP: (110-137)/(44-99) 137/50 mmHg (04/28 1256) SpO2:  [95 %-100 %] 100 % (04/28 1256)  General - Well nourished, well developed, in no apparent distress.  Ophthalmologic - fundi not visualized due to eye movement.  Cardiovascular - Regular rate and rhythm.  Mental Status -  Level of arousal and orientation to time, place, and person were intact. Language including expression, naming, repetition, comprehension was assessed and found intact. Fund of Knowledge was assessed and was impaired with previous president.  Cranial Nerves II - XII - II - Visual field intact OU. III, IV, VI - Extraocular movements intact. V - Facial sensation intact bilaterally. VII - Facial movement intact bilaterally. VIII - Hearing & vestibular intact bilaterally. X - Palate elevates symmetrically. XI - Chin turning & shoulder shrug intact bilaterally. XII - Tongue protrusion intact.  Motor Strength - The patient's strength was 5/5 BUEs but 4/5 BLEs proximal and 5/5 distal and pronator drift was absent.  Bulk was normal and fasciculations were absent.   Motor Tone - Muscle tone was assessed at the neck and appendages and was normal.  Reflexes - The patient's reflexes  were 1+ in all extremities and she had no pathological reflexes.  Sensory - Light touch, temperature/pinprick were assessed and were symmetrical.    Coordination - The patient had normal movements in the hands with no ataxia or dysmetria, but slow.  Tremor was absent.  Gait and Station - need assistance to standup and barely move both feet.   ASSESSMENT/PLAN Ms. Annette Roach is a 81 y.o. female with history of ESRD on HD, HTN, HLD, and DM admitted for slurry speech and ? Right side weakness. Also found to have fever but on today's exam, no focal deficit. MRI negative for stroke. Symptoms rapid improving.    AMS - likely due to fever and ? Infection as well as deconditioning  MRI  No acute infarct  Carotid Doppler unremarkable  2D Echo  EF 45-50% in 10/2014  LDL 83  HgbA1c 5.0 in 10/2014  Heparin subq for VTE prophylaxis  Diet renal with fluid restriction Fluid restriction:: 1200 mL Fluid; Room service appropriate?: Yes; Fluid consistency:: Thin   aspirin 81 mg orally every day prior to admission, now on aspirin 81 mg orally  every day. Continue antiplatelet therapy.  Ongoing aggressive stroke risk factor management  Therapy recommendations:  Home PT  Disposition:  Likely home  Diabetes  HgbA1c 5.0 in 10/2014, goal < 7.0  Controlled  CBG monitoring  Hypertension  Home meds:   amlodipine Currently on amlodipine  Stable  Patient counseled to be compliant with her blood pressure medications  Hyperlipidemia  Home meds:  simvastatin   Currently on simvastatin 5 mg  LDL 83, goal < 100  Continue statin at discharge  Fever  Afebrile since yesterday  Blood culture NGTD  Urine culture NGTD  WBC 14.2  On cefepime and vanco  Other Stroke Risk Factors  Advanced age  Other Active Problems  ESRD on HD  Anemia  leukocystosis  Other Pertinent History  Lives alone, has nursing aide M-F  Hospital day # 2   Neurology will sign off. Please call with  questions. No neurology follow-up indicated at this time. Thanks for the consult.  Rosalin Hawking, MD PhD Stroke Neurology 12/27/2014 2:49 PM    To contact Stroke Continuity provider, please refer to [email or web address] After hours, contact General Neurology

## 2014-12-27 NOTE — Evaluation (Signed)
Physical Therapy Evaluation Patient Details Name: Annette Roach MRN: [id number] DOB: 12-19-1927 Today's Date: 12/27/2014   History of Present Illness  Pt admitted with confusion and R side weakness. MRI negative for intracranial changes.  Pt with R pleural effusion.  Pt with recent admission for PNA. HD T-Th-Sat.  Clinical Impression  Pt admitted with above diagnosis. Pt currently with functional limitations due to the deficits listed below (see PT Problem List). Pt able to ambulate with RW with fatigue the longer she ambulates.  Will have help 24 hours at home per pt.  Pt will benefit from HHPT.  Pt will benefit from skilled PT to increase their independence and safety with mobility to allow discharge to the venue listed below.      Follow Up Recommendations Home health PT;Supervision/Assistance - 24 hour [facility]    Equipment Recommendations  None recommended by PT    Recommendations for Other Services       Precautions / Restrictions Precautions Precautions: Fall Restrictions Weight Bearing Restrictions: No      Mobility  Bed Mobility Overal bed mobility: Needs Assistance Bed Mobility: Supine to Sit     Supine to sit: Min guard     General bed mobility comments: pt up in chair  Transfers Overall transfer level: Needs assistance Equipment used: Rolling walker (2 wheeled) Transfers: Sit to/from Stand Sit to Stand: Supervision         General transfer comment: no physical assist, verbal cues for safety and hand placement on walker  Ambulation/Gait Ambulation/Gait assistance: Min assist Ambulation Distance (Feet): 145 Feet Assistive device: Rolling walker (2 wheeled) Gait Pattern/deviations: Step-through pattern;Decreased stride length;Narrow base of support;Trunk flexed;Antalgic;Drifts right/left   Gait velocity interpretation: Below normal speed for age/gender General Gait Details: Pt used RW for stability.  Needed cues to stand tall as well as to stay close to  RW.  Pt needed min assist for stability as she fatigued as her left trunk and UE weak and she tends to lean and RW drifts right needed assist to steer RW.    Stairs            Wheelchair Mobility    Modified Rankin (Stroke Patients Only)       Balance Overall balance assessment: Needs assistance Sitting-balance support: No upper extremity supported;Feet supported Sitting balance-Leahy Scale: Fair   Postural control: Left lateral lean Standing balance support: Bilateral upper extremity supported;During functional activity Standing balance-Leahy Scale: Poor Standing balance comment: REquires use of UEs on RW.              High level balance activites: Direction changes;Turns;Sudden stops High Level Balance Comments: All of above with min guard asssist.              Pertinent Vitals/Pain Pain Assessment: Faces Faces Pain Scale: Hurts a little bit Pain Location: B knees--reports it being chronic Pain Descriptors / Indicators: Grimacing Pain Intervention(s): Monitored during session;Repositioned          97 bpm- 118 bpm, 98% RA, 22-31, 110/44, 131/70 after walk.     Home Living Family/patient expects to be discharged to:: Private residence Living Arrangements: Children Available Help at Discharge: Family;Personal care attendant Type of Home: House Home Access: [place]: One [place]: Environmental consultant - 2 wheels;Bedside commode;Shower seat;Grab bars - tub/shower;Wheelchair - manual;Hospital bed;Cane - single point      Prior Function Level of Independence: Needs assistance   Gait / Transfers Assistance Needed: Pt uses a  walker at home and is supervised when walking.due to falls.  ADL's / Homemaking Assistance Needed: Assist for all ADLs and IADLs. Daughter and aide assist.        Hand Dominance   Dominant Hand: Right    Extremity/Trunk Assessment   Upper Extremity Assessment: Overall WFL for tasks assessed            Lower Extremity Assessment: Defer to PT evaluation      Cervical / Trunk Assessment: Kyphotic  Communication   Communication: No difficulties  Cognition Arousal/Alertness: Awake/alert Behavior During Therapy: WFL for tasks assessed/performed Overall Cognitive Status: Within Functional Limits for tasks assessed (no family present)                      General Comments General comments (skin integrity, edema, etc.):    Exercises General Exercises - Lower Extremity Ankle Circles/Pumps: AROM;Both;10 reps;Seated Long Arc Quad: AROM;Both;10 reps;Seated Hip Flexion/Marching: AROM;Both;10 reps;Seated      Assessment/Plan    PT Assessment Patient needs continued PT services  PT Diagnosis Generalized weakness   PT Problem List Decreased activity tolerance;Decreased balance;Decreased mobility;Decreased knowledge of use of DME;Decreased safety awareness;Decreased knowledge of precautions  PT Treatment Interventions Gait training;DME instruction;Functional mobility training;Therapeutic activities;Therapeutic exercise;Balance training;Patient/family education   PT Goals (Current goals can be found in the Care Plan section) Acute Rehab PT Goals Patient Stated Goal: return home PT Goal Formulation: With patient Time For Goal Achievement: 01/10/15 Potential to Achieve Goals: Good    Frequency Min 3X/week   Barriers to discharge        Co-evaluation               End of Session Equipment Utilized During Treatment: Gait belt Activity Tolerance: Patient limited by fatigue Patient left: in chair;with call bell/phone within reach Nurse Communication: Mobility status         Time: 0920-0949 PT Time Calculation (min) (ACUTE ONLY): 29 min   Charges:   PT Evaluation $Initial PT Evaluation Tier I: 1 Procedure PT Treatments $Gait Training: 8-22 mins   PT G CodesTashara, Suder 2015-01-12, 12:53 PM Keltin Baird Vasco,PT Acute  Rehabilitation [phone] (pager)

## 2014-12-27 NOTE — Progress Notes (Signed)
*  PRELIMINARY RESULTS* Vascular Ultrasound Carotid Duplex (Doppler) has been completed.  Findings suggest 1-39% internal carotid artery stenosis bilaterally. Vertebral arteries are patent with antegrade flow.  12/27/2014  Maudry Mayhew, RVT, RDCS, RDMS

## 2014-12-27 NOTE — Evaluation (Signed)
Occupational Therapy Evaluation Patient Details Name: Annette Roach MRN: [id number] DOB: May 14, 1928 Today's Date: 12/27/2014    History of Present Illness Pt admitted with confusion and R side weakness. MRI negative for intracranial changes.  Pt with R pleural effusion.  Pt with recent admission for PNA. HD T-Th-Sat.   Clinical Impression   Pt was assisted for bathing and dressing, supervised for ambulation with RW, and dependent in all IADL prior to admission.  No family available to determine whether pt's cognition has returned to baseline.  Recommend return home with family and personal care attendant. No further OT needs.    Follow Up Recommendations  No OT follow up;Supervision/Assistance - 24 hour    Equipment Recommendations  None recommended by OT    Recommendations for Other Services       Precautions / Restrictions Precautions Precautions: Fall Restrictions Weight Bearing Restrictions: No      Mobility Bed Mobility               General bed mobility comments: pt up in chair  Transfers Overall transfer level: Needs assistance Equipment used: Rolling walker (2 wheeled) Transfers: Sit to/from Stand Sit to Stand: Supervision         General transfer comment: no physical assist, verbal cues for safety and hand placement on walker    Balance                                            ADL Overall ADL's : At baseline                                       General ADL Comments: Pt can self feed, ambulated to commode with supervision, min assist to manage pericare and gowns.     Vision     Perception     Praxis      Pertinent Vitals/Pain Pain Assessment: Faces Faces Pain Scale: Hurts a little bit Pain Location: B knees--reports it being chronic Pain Descriptors / Indicators: Grimacing Pain Intervention(s): Monitored during session;Repositioned     Hand Dominance Right   Extremity/Trunk Assessment Upper  Extremity Assessment Upper Extremity Assessment: Overall WFL for tasks assessed   Lower Extremity Assessment Lower Extremity Assessment: Defer to PT evaluation       Communication Communication Communication: No difficulties   Cognition Arousal/Alertness: Awake/alert Behavior During Therapy: WFL for tasks assessed/performed Overall Cognitive Status: Within Functional Limits for tasks assessed (no family present)                     General Comments       Exercises       Shoulder Instructions      Home Living Family/patient expects to be discharged to:: Private residence Living Arrangements: Children Available Help at Discharge: Family;Personal care attendant Type of Home: House Home Access: Ramped entrance     Home Layout: One level     Bathroom Shower/Tub: Teacher, early years/pre: Standard     Home Equipment: Environmental consultant - 2 wheels;Bedside commode;Shower seat;Grab bars - tub/shower;Wheelchair - manual;Hospital bed;Cane - single point          Prior Functioning/Environment Level of Independence: Needs assistance  Gait / Transfers Assistance Needed: Pt uses a walker at home and is supervised when walking.due  to falls. ADL's / Homemaking Assistance Needed: Assist for all ADLs and IADLs. Daughter and aide assist.        OT Diagnosis:     OT Problem List:     OT Treatment/Interventions:      OT Goals(Current goals can be found in the care plan section) Acute Rehab OT Goals Patient Stated Goal: return home  OT Frequency:     Barriers to D/C:            Co-evaluation              End of Session Equipment Utilized During Treatment: Rolling walker;Gait belt  Activity Tolerance: Patient tolerated treatment well Patient left: in chair;with call bell/phone within reach   Time: 1000-1021 OT Time Calculation (min): 21 min Charges:  OT General Charges $OT Visit: 1 Procedure OT Evaluation $Initial OT Evaluation Tier I: 1  Procedure G-Codes:    Malka So 12/27/2014, 10:52 AM  [phone]

## 2014-12-27 NOTE — Progress Notes (Signed)
Patient ID: Annette Roach, female   DOB: 10-Aug-1928, 81 y.o.   MRN: [id number]  [facility] Progress Note   Assessment/ Plan:   1. Altered mental status: Suspected to be from acute illness delirium-likely pneumonia. CT scan and MRI of the brain did not show CVA. Additional workup as directed by neurology. Mental status appears to be waxing and waning with probable sundowning effect affecting her mentation in the mornings. 2. Pneumonia with possible sepsis: On proximal spectrum antibiotic coverage for healthcare associated pneumonia given recent hospitalization. Blood cultures and urine cultures are currently pending. 3. ESRD: Hemodialysis ordered for later today-this is in keeping with her usual Tuesday/Thursday/Saturday schedule; no acute indications noted. 4. Anemia of chronic kidney disease: Hemoglobin currently at goal, no indications for ESA at this time. 5. Metabolic bone disease: Resumed phosphorus binders (with meals) as well as calcitriol at dialysis.  Subjective:   No acute events noted overnight-indicates she is comfortable    Objective:   BP 121/99 mmHg  Pulse 97  Temp(Src) 98.2 F (36.8 C) (Oral)  Resp 24  Ht 5\' 2"  (1.575 m)  Wt 68.2 kg (150 lb 5.7 oz)  BMI 27.49 kg/m2  SpO2 100%  Physical Exam: Gen: Somnolent, difficult to arouse but recognizes me CVS: Pulse regular in rate and rhythm Resp: Clear to auscultation, no distinct rales Abd: Soft, flat, nontender Ext: No lower extremity edema  Labs: BMET  Recent Labs Lab 12/25/14 0913 12/25/14 0950 12/26/14 0248  NA 137 137 135  K 4.5 4.6 4.0  CL 93* 94* 95*  CO2 30  --  27  GLUCOSE 117* 121* 143*  BUN 20 25* 10  CREATININE 9.32* 9.00* 4.97*  CALCIUM 9.0  --  9.1   CBC  Recent Labs Lab 12/25/14 0913 12/25/14 0950 12/26/14 0248  WBC 12.6*  --  14.2*  NEUTROABS 9.8*  --   --   HGB 9.5* 11.9* 10.1*  HCT 30.9* 35.0* 33.1*  MCV 86.3  --  87.8  PLT 378  --  345   Medications:    .  aspirin EC  81 mg Oral Daily  . Azelastine HCl  1 spray Each Nare Daily  . budesonide (PULMICORT) nebulizer solution  0.5 mg Nebulization BID  . calcium acetate  667 mg Oral BID AC  . ceFEPime (MAXIPIME) IV  2 g Intravenous Q T,Th,Sa-HD  . gabapentin  300 mg Oral QHS  . heparin  5,000 Units Subcutaneous 3 times per day  . multivitamin  1 tablet Oral Daily  . pantoprazole  40 mg Oral Daily  . pneumococcal 23 valent vaccine  0.5 mL Intramuscular Tomorrow-1000  . polyethylene glycol  17 g Oral Daily  . simvastatin  5 mg Oral QHS  . vancomycin  750 mg Intravenous Q T,Th,Sa-HD   Elmarie Shiley, MD 12/27/2014, 8:33 AM

## 2014-12-27 NOTE — Progress Notes (Signed)
[facility] TEAM 1 - Stepdown/ICU TEAM Progress Note  Annette Roach LKG:[id number] DOB: Aug 05, 1928 DOA: 12/25/2014 PCP: Gilford Rile, MD  Admit HPI / Brief Narrative: 81 year old BF PMHx ESRD on HD Tue/Thur/Sat, CAD, hyperlipidemia, COPD, and gastric cancer.  Brought to the hospital due to confusion. Patient had recently been hospitalized for pneumonia and given 3 days of vancomycin and Zosyn and later transitioned to Levaquin for discharge. EMS was called and she was noted to have right greater than left extremity weakness and therefore a code stroke was called. Subsequent workup was negative for CVA including MRI and CT of the head.   In the emergency department she was found to be febrile with a temp of 101. She was tachycardic into the 120s, leukocytosis of 12.6, chest x-ray with right pleural effusion.    HPI/Subjective: 4/28 A/O 4, states prior to admission positive SOB, positive nonproductive cough.currently negative SOB, negative CP, positive cough (nonproductive)   Assessment/Plan: Acute toxic metabolic encephalopathy -MRI confirmed there has not been a CVA -Suspect related to sepsis - appears to have returned to her baseline  Sepsis due to right lower lobe healthcare associated pneumonia -febrile on 4/27 to 38.7c -Continue empiric antibiotic therapy  - clinically the patient is improving -chest CT; new pleural effusion; ultrasound guided thoracentesis in a.m.; malignant pleural effusion? -Convert to PO antibiotics if patient still stable in a.m.  Right pleural effusion (malignant?) -Thoracentesis in the a.m.  Gastric CA -Patient recently diagnosed with gastric cancer per daughter Annette Roach. Nonoperable was scheduled to see a Dr. Kendall Flack (oncology) at [facility] on 4/29  End-stage renal disease -On hemodialysis Tuesday Thursday Saturday. -Nephrology is following   Hypertension -Blood pressure is currently well-controlled  Anemia of chronic kidney disease -Hemoglobin  stable - care per nephrology  Reported history of diabetes -Patient is on no diabetic medications at home - A1c was 5.0 February 2016 - no indication to follow CBGs presently   Code Status: FULL Family Communication: spoke with Ms. Conley Canal (daughter) via phone Disposition Plan:    Consultants: Dr. Elmarie Shiley.(Nephrology) Dr. Rosalin Hawking.(Neurology)   Procedure/Significant Events: 4/26 MRI brain without contrast; negative for acute CVA 4/26 PCXR;. Cardiomegaly.-. Asymmetric right-sided interstitial and airspace disease concerning for edema.-Probable right pleural effusion and atelectasis. Early infection?- Interval clearing of the left lung base. 4/28 CT chest without contrast;-.New moderate right pleural effusion with secondary compressive atelectasis in the right lower lobe. - Stable pulmonary nodules bilaterally since 10/26/2014.-Stable cardiomegaly    Culture 4/26 blood left wrist/hand NGTD 4/26 urine negative 4/26 MRSA by PCR negative   Antibiotics: Cefepime 4/26 > Vancomycin 4/26 > Zosyn 4/26   DVT prophylaxis: Subcutaneous heparin   Devices    LINES / TUBES:      Continuous Infusions:   Objective: VITAL SIGNS: Temp: 98.2 F (36.8 C) (04/28 2018) Temp Source: Oral (04/28 2018) BP: 107/68 mmHg (04/28 2115) Pulse Rate: 107 (04/28 2030) SPO2; FIO2:   Intake/Output Summary (Last 24 hours) at 12/27/14 2223 Last data filed at 12/27/14 2018  Gross per 24 hour  Intake    360 ml  Output   1481 ml  Net  -1121 ml     Exam: General: A/O 4, NAD,No acute respiratory distress Lungs: Clear to auscultation bilaterally except decreased/absent breath sounds RLL, negative wheezing or crackles Cardiovascular: Regular rate and rhythm without murmur gallop or rub normal S1 and S2 Abdomen: Nontender, nondistended, soft, bowel sounds positive, no rebound, no ascites, no appreciable mass Extremities: No significant cyanosis, clubbing, or  edema bilateral lower  extremities  Data Reviewed: Basic Metabolic Panel:  Recent Labs Lab 12/25/14 0913 12/25/14 0950 12/26/14 0248 12/27/14 1700  NA 137 137 135 134*  K 4.5 4.6 4.0 4.1  CL 93* 94* 95* 94*  CO2 30  --  27 27  GLUCOSE 117* 121* 143* 103*  BUN 20 25* 10 40*  CREATININE 9.32* 9.00* 4.97* 7.93*  CALCIUM 9.0  --  9.1 8.6  PHOS  --   --   --  4.2   Liver Function Tests:  Recent Labs Lab 12/25/14 0913 12/27/14 1700  AST 16  --   ALT 8  --   ALKPHOS 97  --   BILITOT 0.7  --   PROT 8.3  --   ALBUMIN 2.7* 2.3*   No results for input(s): LIPASE, AMYLASE in the last 168 hours. No results for input(s): AMMONIA in the last 168 hours. CBC:  Recent Labs Lab 12/25/14 0913 12/25/14 0950 12/26/14 0248 12/27/14 1700  WBC 12.6*  --  14.2* 10.4  NEUTROABS 9.8*  --   --   --   HGB 9.5* 11.9* 10.1* 9.0*  HCT 30.9* 35.0* 33.1* 27.8*  MCV 86.3  --  87.8 83.5  PLT 378  --  345 361   Cardiac Enzymes: No results for input(s): CKTOTAL, CKMB, CKMBINDEX, TROPONINI in the last 168 hours. BNP (last 3 results) No results for input(s): BNP in the last 8760 hours.  ProBNP (last 3 results) No results for input(s): PROBNP in the last 8760 hours.  CBG:  Recent Labs Lab 12/26/14 0059 12/26/14 0816  GLUCAP 122* 119*    Recent Results (from the past 240 hour(s))  Blood Culture (routine x 2)     Status: None (Preliminary result)   Collection Time: 12/25/14  1:30 PM  Result Value Ref Range Status   Specimen Description BLOOD LEFT WRIST  Final   Special Requests BOTTLES DRAWN AEROBIC AND ANAEROBIC 5CC  Final   Culture   Final           BLOOD CULTURE RECEIVED NO GROWTH TO DATE CULTURE WILL BE HELD FOR 5 DAYS BEFORE ISSUING A FINAL NEGATIVE REPORT Performed at [facility]    Report Status PENDING  Incomplete  Blood Culture (routine x 2)     Status: None (Preliminary result)   Collection Time: 12/25/14  1:55 PM  Result Value Ref Range Status   Specimen Description BLOOD HAND LEFT   Final   Special Requests BOTTLES DRAWN AEROBIC AND ANAEROBIC 5CC  Final   Culture   Final           BLOOD CULTURE RECEIVED NO GROWTH TO DATE CULTURE WILL BE HELD FOR 5 DAYS BEFORE ISSUING A FINAL NEGATIVE REPORT Performed at [facility]    Report Status PENDING  Incomplete  Urine culture     Status: None   Collection Time: 12/25/14  3:17 PM  Result Value Ref Range Status   Specimen Description URINE, CATHETERIZED  Final   Special Requests NONE  Final   Colony Count NO GROWTH Performed at [facility]   Final   Culture NO GROWTH Performed at [facility]   Final   Report Status 12/27/2014 FINAL  Final  MRSA PCR Screening     Status: None   Collection Time: 12/25/14  6:34 PM  Result Value Ref Range Status   MRSA by PCR NEGATIVE NEGATIVE Final    Comment:  The GeneXpert MRSA Assay (FDA approved for NASAL specimens only), is one component of a comprehensive MRSA colonization surveillance program. It is not intended to diagnose MRSA infection nor to guide or monitor treatment for MRSA infections.      Studies:  Recent x-ray studies have been reviewed in detail by the Attending Physician  Scheduled Meds:  Scheduled Meds: . aspirin EC  81 mg Oral Daily  . Azelastine HCl  1 spray Each Nare Daily  . budesonide (PULMICORT) nebulizer solution  0.5 mg Nebulization BID  . calcium acetate  667 mg Oral BID AC  . ceFEPime (MAXIPIME) IV  2 g Intravenous Q T,Th,Sa-HD  . gabapentin  300 mg Oral QHS  . heparin  5,000 Units Subcutaneous 3 times per day  . multivitamin  1 tablet Oral Daily  . pantoprazole  40 mg Oral Daily  . polyethylene glycol  17 g Oral Daily  . simvastatin  5 mg Oral QHS  . vancomycin  750 mg Intravenous Q T,Th,Sa-HD    Time spent on care of this patient: 40 mins   Raysean Graumann, Geraldo Docker , MD  [facility] Office  [phone] Pager [phone]  On-Call/Text Page:      [email or web address]      password [email or web address]  If 7PM-7AM,  please contact night-coverage [email or web address] Password [facility] 12/27/2014, 10:23 PM   LOS: 2 days   Care during the described time interval was provided by me .  I have reviewed this patient's available data, including medical history, events of note, physical examination, radiology studies and test results as part of my evaluation  Dia Crawford, MD [phone] Pager

## 2014-12-28 ENCOUNTER — Inpatient Hospital Stay (HOSPITAL_COMMUNITY): Payer: Medicare Other

## 2014-12-28 LAB — CBC WITH DIFFERENTIAL/PLATELET
BASOS ABS: 0 10*3/uL (ref 0.0–0.1)
Basophils Relative: 0 % (ref 0–1)
EOS ABS: 0.3 10*3/uL (ref 0.0–0.7)
EOS PCT: 3 % (ref 0–5)
HCT: 29.6 % — ABNORMAL LOW (ref 36.0–46.0)
Hemoglobin: 9.3 g/dL — ABNORMAL LOW (ref 12.0–15.0)
LYMPHS PCT: 20 % (ref 12–46)
Lymphs Abs: 1.8 10*3/uL (ref 0.7–4.0)
MCH: 26.4 pg (ref 26.0–34.0)
MCHC: 31.4 g/dL (ref 30.0–36.0)
MCV: 84.1 fL (ref 78.0–100.0)
Monocytes Absolute: 0.6 10*3/uL (ref 0.1–1.0)
Monocytes Relative: 7 % (ref 3–12)
Neutro Abs: 6.2 10*3/uL (ref 1.7–7.7)
Neutrophils Relative %: 70 % (ref 43–77)
PLATELETS: 345 10*3/uL (ref 150–400)
RBC: 3.52 MIL/uL — ABNORMAL LOW (ref 3.87–5.11)
RDW: 19.9 % — ABNORMAL HIGH (ref 11.5–15.5)
WBC: 8.9 10*3/uL (ref 4.0–10.5)

## 2014-12-28 LAB — COMPREHENSIVE METABOLIC PANEL
ALBUMIN: 2.5 g/dL — AB (ref 3.5–5.2)
ALT: 8 U/L (ref 0–35)
AST: 18 U/L (ref 0–37)
Alkaline Phosphatase: 86 U/L (ref 39–117)
Anion gap: 10 (ref 5–15)
BUN: 16 mg/dL (ref 6–23)
CALCIUM: 9.1 mg/dL (ref 8.4–10.5)
CO2: 29 mmol/L (ref 19–32)
Chloride: 101 mmol/L (ref 96–112)
Creatinine, Ser: 4.01 mg/dL — ABNORMAL HIGH (ref 0.50–1.10)
GFR, EST AFRICAN AMERICAN: 11 mL/min — AB (ref >90)
GFR, EST NON AFRICAN AMERICAN: 9 mL/min — AB (ref >90)
Glucose, Bld: 156 mg/dL — ABNORMAL HIGH (ref 70–99)
POTASSIUM: 3.8 mmol/L (ref 3.5–5.1)
Sodium: 140 mmol/L (ref 135–145)
TOTAL PROTEIN: 8.1 g/dL (ref 6.0–8.3)
Total Bilirubin: 0.6 mg/dL (ref 0.3–1.2)

## 2014-12-28 LAB — CBC
HEMATOCRIT: 32.6 % — AB (ref 36.0–46.0)
Hemoglobin: 10.1 g/dL — ABNORMAL LOW (ref 12.0–15.0)
MCH: 27 pg (ref 26.0–34.0)
MCHC: 31 g/dL (ref 30.0–36.0)
MCV: 87.2 fL (ref 78.0–100.0)
Platelets: 324 10*3/uL (ref 150–400)
RBC: 3.74 MIL/uL — AB (ref 3.87–5.11)
RDW: 20 % — AB (ref 11.5–15.5)
WBC: 9 10*3/uL (ref 4.0–10.5)

## 2014-12-28 LAB — RENAL FUNCTION PANEL
ALBUMIN: 2.7 g/dL — AB (ref 3.5–5.2)
Anion gap: 11 (ref 5–15)
BUN: 20 mg/dL (ref 6–23)
CALCIUM: 9.2 mg/dL (ref 8.4–10.5)
CO2: 26 mmol/L (ref 19–32)
CREATININE: 4.98 mg/dL — AB (ref 0.50–1.10)
Chloride: 99 mmol/L (ref 96–112)
GFR calc non Af Amer: 7 mL/min — ABNORMAL LOW (ref >90)
GFR, EST AFRICAN AMERICAN: 8 mL/min — AB (ref >90)
Glucose, Bld: 154 mg/dL — ABNORMAL HIGH (ref 70–99)
POTASSIUM: 4.9 mmol/L (ref 3.5–5.1)
Phosphorus: 3.4 mg/dL (ref 2.3–4.6)
SODIUM: 136 mmol/L (ref 135–145)

## 2014-12-28 LAB — MAGNESIUM: Magnesium: 1.9 mg/dL (ref 1.5–2.5)

## 2014-12-28 MED ORDER — HEPARIN SODIUM (PORCINE) 1000 UNIT/ML DIALYSIS
40.0000 [IU]/kg | INTRAMUSCULAR | Status: DC | PRN
  Filled 2014-12-28: qty 3

## 2014-12-28 MED ORDER — AZELASTINE HCL 0.1 % NA SOLN
1.0000 | Freq: Every day | NASAL | Status: DC
  Administered 2014-12-28 – 2014-12-29 (×2): 1 via NASAL
  Filled 2014-12-28: qty 30

## 2014-12-28 MED ORDER — LIDOCAINE HCL (PF) 1 % IJ SOLN
INTRAMUSCULAR | Status: AC
  Filled 2014-12-28: qty 10

## 2014-12-28 MED ORDER — AMOXICILLIN-POT CLAVULANATE 500-125 MG PO TABS
1.0000 | ORAL_TABLET | Freq: Every day | ORAL | Status: DC
  Administered 2014-12-29: 500 mg via ORAL
  Filled 2014-12-28: qty 1

## 2014-12-28 NOTE — Progress Notes (Signed)
Received a call from radiology around 2300 to inform me that the order for U/S thoracentesis cannot be carried out until in the morning unless MD opted to call in on-call radiologist.  Have notified on call Nurse practitioner of this.

## 2014-12-28 NOTE — Progress Notes (Signed)
Patient ID: Annette Roach, female   DOB: Jan 09, 1928, 81 y.o.   MRN: [id number]  [facility] Progress Note   Assessment/ Plan:   1. Altered mental status: Suspected to be from acute illness delirium-likely pneumonia. CT scan and MRI of the brain did not show CVA. Carotid Doppler reports reviewed-no significant carotid stenosis and normal vertebral artery flow. Mental status now appears to be back to baseline. 2. Pneumonia with possible sepsis: On proximal spectrum antibiotic coverage for healthcare associated pneumonia given recent hospitalization. Cultures negative to date. Hospitalist service planning for thoracentesis possibly today of right pleural effusion. 3. ESRD: Without acute dialysis needs at this time-hemodialysis ordered for tomorrow 4. Anemia of chronic kidney disease: Hemoglobin currently at goal, no indications for ESA at this time. 5. Metabolic bone disease: Resumed phosphorus binders (with meals) as well as calcitriol at dialysis.  Subjective:   Reports to be feeling better and inquires about going home. Events from overnight noted-hospitalist service arranging for a thoracentesis    Objective:   BP 127/47 mmHg  Pulse 93  Temp(Src) 98 F (36.7 C) (Oral)  Resp 23  Ht 5\' 2"  (1.575 m)  Wt 68.5 kg (151 lb 0.2 oz)  BMI 27.61 kg/m2  SpO2 100%  Physical Exam: Gen: Comfortably sitting in recliner CVS: Pulse regular in rate and rhythm Resp: Clear to auscultation, no distinct rales Abd: Soft, obese, nontender Ext: No lower extremity edema  Labs: BMET  Recent Labs Lab 12/25/14 0913 12/25/14 0950 12/26/14 0248 12/27/14 1700 12/28/14 0223  NA 137 137 135 134* 140  K 4.5 4.6 4.0 4.1 3.8  CL 93* 94* 95* 94* 101  CO2 30  --  27 27 29   GLUCOSE 117* 121* 143* 103* 156*  BUN 20 25* 10 40* 16  CREATININE 9.32* 9.00* 4.97* 7.93* 4.01*  CALCIUM 9.0  --  9.1 8.6 9.1  PHOS  --   --   --  4.2  --    CBC  Recent Labs Lab 12/25/14 0913 12/25/14 0950  12/26/14 0248 12/27/14 1700 12/28/14 0223  WBC 12.6*  --  14.2* 10.4 8.9  NEUTROABS 9.8*  --   --   --  6.2  HGB 9.5* 11.9* 10.1* 9.0* 9.3*  HCT 30.9* 35.0* 33.1* 27.8* 29.6*  MCV 86.3  --  87.8 83.5 84.1  PLT 378  --  345 361 345   Medications:    . aspirin EC  81 mg Oral Daily  . Azelastine HCl  1 spray Each Nare Daily  . budesonide (PULMICORT) nebulizer solution  0.5 mg Nebulization BID  . calcium acetate  667 mg Oral BID AC  . ceFEPime (MAXIPIME) IV  2 g Intravenous Q T,Th,Sa-HD  . gabapentin  300 mg Oral QHS  . heparin  5,000 Units Subcutaneous 3 times per day  . multivitamin  1 tablet Oral Daily  . pantoprazole  40 mg Oral Daily  . polyethylene glycol  17 g Oral Daily  . simvastatin  5 mg Oral QHS  . vancomycin  750 mg Intravenous Q T,Th,Sa-HD   Elmarie Shiley, MD 12/28/2014, 8:24 AM

## 2014-12-28 NOTE — Progress Notes (Signed)
Patient was scheduled today for a thoracentesis, upon reviewing [place] images trace amount of right pleural fluid seen that is not amendable to percutaneous drainage.   Tsosie Billing PA-C Interventional Radiology  12/28/14  10:51 AM

## 2014-12-28 NOTE — Progress Notes (Signed)
[facility] TEAM 1 - Stepdown/ICU TEAM Progress Note  AQUANETTA SCHWARZ WUJ:[id number] DOB: 04-30-1928 DOA: 12/25/2014 PCP: Gilford Rile, MD  Admit HPI / Brief Narrative: 81 year old woman with history significant for end-stage renal disease on hemodialysis Tue/Thur/Sat, coronary artery disease, hyperlipidemia, COPD, and stomach cancer who was brought to the hospital due to confusion. Patient had recently been hospitalized for pneumonia and given 3 days of vancomycin and Zosyn and later transitioned to Levaquin for discharge. EMS was called and she was noted to have right greater than left extremity weakness and therefore a code stroke was called. Subsequent workup was negative for CVA including MRI and CT of the head.   In the emergency department she was found to be febrile with a temp of 101. She was tachycardic into the 120s, leukocytosis of 12.6, chest x-ray with right pleural effusion.   HPI/Subjective: The patient reports some stomach discomfort when she attempts to eat what she and her daughter report is a chronic problem.  She denies chest pain fevers chills nausea or vomiting.  She is anxious to be discharged home.  Assessment/Plan:  Acute toxic metabolic encephalopathy -MRI confirmed there has not been a CVA -Suspect related to sepsis - appears to have returned mostly to her baseline no family does report some mild intermittent confusion which persists likely due to sundowning  Sepsis due to right lower lobe healthcare associated pneumonia -Continue empiric antibiotic therapy - clinically the patient is improving - sepsis physiology has essentially resolved and the patient is presently hemodynamically stable - transition to oral antibiotic this evening  End-stage renal disease -On hemodialysis Tuesday Thursday Saturday. -Nephrology is following  Hypertension -Blood pressure is currently well-controlled  Anemia of chronic kidney disease -Hemoglobin stable - care per  nephrology  Reported history of diabetes -Patient is on no diabetic medications at home - A1c was 5.0 February 2016 - no indication to follow CBGs presently  Code Status: FULL Family Communication: Spoke with patient's daughter at bedside at length Disposition Plan: Plan for discharge home 4/30 after completion of dialysis  Consultants: Nephrology Neurology  Procedures: None  Antibiotics: Cefepime 4/26 > Vancomycin 4/26 > Zosyn 4/26  DVT prophylaxis: Subcutaneous heparin  Objective: Blood pressure 119/40, pulse 93, temperature 98 F (36.7 C), temperature source Oral, resp. rate 23, height 5\' 2"  (1.575 m), weight 68.5 kg (151 lb 0.2 oz), SpO2 100 %.  Intake/Output Summary (Last 24 hours) at 12/28/14 1623 Last data filed at 12/28/14 0630  Gross per 24 hour  Intake    200 ml  Output   1481 ml  Net  -1281 ml   Exam: General: No acute respiratory distress - alert and conversant Lungs: Clear to auscultation today with no wheeze Cardiovascular: Regular rate and rhythm without murmur gallop or rub  Abdomen: Nontender, nondistended, soft, bowel sounds positive, no rebound, no ascites, no appreciable mass Extremities: No significant cyanosis, clubbing, edema bilateral lower extremities  Data Reviewed: Basic Metabolic Panel:  Recent Labs Lab 12/25/14 0913 12/25/14 0950 12/26/14 0248 12/27/14 1700 12/28/14 0223 12/28/14 0932  NA 137 137 135 134* 140 136  K 4.5 4.6 4.0 4.1 3.8 4.9  CL 93* 94* 95* 94* 101 99  CO2 30  --  27 27 29 26   GLUCOSE 117* 121* 143* 103* 156* 154*  BUN 20 25* 10 40* 16 20  CREATININE 9.32* 9.00* 4.97* 7.93* 4.01* 4.98*  CALCIUM 9.0  --  9.1 8.6 9.1 9.2  MG  --   --   --   --  1.9  --   PHOS  --   --   --  4.2  --  3.4    Liver Function Tests:  Recent Labs Lab 12/25/14 0913 12/27/14 1700 12/28/14 0223 12/28/14 0932  AST 16  --  18  --   ALT 8  --  8  --   ALKPHOS 97  --  86  --   BILITOT 0.7  --  0.6  --   PROT 8.3  --  8.1  --    ALBUMIN 2.7* 2.3* 2.5* 2.7*    Coags:  Recent Labs Lab 12/25/14 0913  INR 1.23    Recent Labs Lab 12/25/14 0913  APTT 32    CBC:  Recent Labs Lab 12/25/14 0913 12/25/14 0950 12/26/14 0248 12/27/14 1700 12/28/14 0223 12/28/14 0932  WBC 12.6*  --  14.2* 10.4 8.9 9.0  NEUTROABS 9.8*  --   --   --  6.2  --   HGB 9.5* 11.9* 10.1* 9.0* 9.3* 10.1*  HCT 30.9* 35.0* 33.1* 27.8* 29.6* 32.6*  MCV 86.3  --  87.8 83.5 84.1 87.2  PLT 378  --  345 361 345 324    CBG:  Recent Labs Lab 12/26/14 0059 12/26/14 0816  GLUCAP 122* 119*    Recent Results (from the past 240 hour(s))  Blood Culture (routine x 2)     Status: None (Preliminary result)   Collection Time: 12/25/14  1:30 PM  Result Value Ref Range Status   Specimen Description BLOOD LEFT WRIST  Final   Special Requests BOTTLES DRAWN AEROBIC AND ANAEROBIC 5CC  Final   Culture   Final           BLOOD CULTURE RECEIVED NO GROWTH TO DATE CULTURE WILL BE HELD FOR 5 DAYS BEFORE ISSUING A FINAL NEGATIVE REPORT Performed at [facility]    Report Status PENDING  Incomplete  Blood Culture (routine x 2)     Status: None (Preliminary result)   Collection Time: 12/25/14  1:55 PM  Result Value Ref Range Status   Specimen Description BLOOD HAND LEFT  Final   Special Requests BOTTLES DRAWN AEROBIC AND ANAEROBIC 5CC  Final   Culture   Final           BLOOD CULTURE RECEIVED NO GROWTH TO DATE CULTURE WILL BE HELD FOR 5 DAYS BEFORE ISSUING A FINAL NEGATIVE REPORT Performed at [facility]    Report Status PENDING  Incomplete  Urine culture     Status: None   Collection Time: 12/25/14  3:17 PM  Result Value Ref Range Status   Specimen Description URINE, CATHETERIZED  Final   Special Requests NONE  Final   Colony Count NO GROWTH Performed at [facility]   Final   Culture NO GROWTH Performed at [facility]   Final   Report Status 12/27/2014 FINAL  Final  MRSA PCR Screening     Status: None    Collection Time: 12/25/14  6:34 PM  Result Value Ref Range Status   MRSA by PCR NEGATIVE NEGATIVE Final    Comment:        The GeneXpert MRSA Assay (FDA approved for NASAL specimens only), is one component of a comprehensive MRSA colonization surveillance program. It is not intended to diagnose MRSA infection nor to guide or monitor treatment for MRSA infections.      Studies:   Recent x-ray studies have been reviewed in detail by the Attending Physician  Scheduled Meds:  Scheduled Meds: . aspirin EC  81 mg Oral Daily  . azelastine  1 spray Each Nare Daily  . budesonide (PULMICORT) nebulizer solution  0.5 mg Nebulization BID  . calcium acetate  667 mg Oral BID AC  . ceFEPime (MAXIPIME) IV  2 g Intravenous Q T,Th,Sa-HD  . gabapentin  300 mg Oral QHS  . heparin  5,000 Units Subcutaneous 3 times per day  . lidocaine (PF)      . multivitamin  1 tablet Oral Daily  . pantoprazole  40 mg Oral Daily  . polyethylene glycol  17 g Oral Daily  . simvastatin  5 mg Oral QHS    Time spent on care of this patient: 35 mins   MCCLUNG,JEFFREY T , MD   [facility] Office  [phone] Pager - Text Page per Shea Evans as per below:  On-Call/Text Page:      [email or web address]      password [email or web address]  If 7PM-7AM, please contact night-coverage [email or web address] Password [email or web address] 12/28/2014, 4:23 PM   LOS: 3 days

## 2014-12-28 NOTE — Progress Notes (Signed)
ANTIBIOTIC CONSULT NOTE - FOLLOW UP  Pharmacy Consult for Vancomycin + Cefepime Indication: pneumonia  No Known Allergies  Patient Measurements: Height: 5\' 2"  (157.5 cm) Weight: 151 lb 0.2 oz (68.5 kg) IBW/kg (Calculated) : 50.1  Vital Signs: Temp: 98 F (36.7 C) (04/29 0816) Temp Source: Oral (04/29 0816) BP: 127/47 mmHg (04/29 0816) Pulse Rate: 93 (04/29 0816) Intake/Output from previous day: 04/28 0701 - 04/29 0700 In: 440 [P.O.:440] Out: 1481  Intake/Output from this shift:    Labs:  Recent Labs  12/27/14 1700 12/28/14 0223 12/28/14 0932  WBC 10.4 8.9 9.0  HGB 9.0* 9.3* 10.1*  PLT 361 345 324  CREATININE 7.93* 4.01* 4.98*   Estimated Creatinine Clearance: 7.5 mL/min (by C-G formula based on Cr of 4.98). No results for input(s): VANCOTROUGH, VANCOPEAK, VANCORANDOM, GENTTROUGH, GENTPEAK, GENTRANDOM, TOBRATROUGH, TOBRAPEAK, TOBRARND, AMIKACINPEAK, AMIKACINTROU, AMIKACIN in the last 72 hours.   Microbiology: Recent Results (from the past 720 hour(s))  Blood Culture (routine x 2)     Status: None (Preliminary result)   Collection Time: 12/25/14  1:30 PM  Result Value Ref Range Status   Specimen Description BLOOD LEFT WRIST  Final   Special Requests BOTTLES DRAWN AEROBIC AND ANAEROBIC 5CC  Final   Culture   Final           BLOOD CULTURE RECEIVED NO GROWTH TO DATE CULTURE WILL BE HELD FOR 5 DAYS BEFORE ISSUING A FINAL NEGATIVE REPORT Performed at [facility]    Report Status PENDING  Incomplete  Blood Culture (routine x 2)     Status: None (Preliminary result)   Collection Time: 12/25/14  1:55 PM  Result Value Ref Range Status   Specimen Description BLOOD HAND LEFT  Final   Special Requests BOTTLES DRAWN AEROBIC AND ANAEROBIC 5CC  Final   Culture   Final           BLOOD CULTURE RECEIVED NO GROWTH TO DATE CULTURE WILL BE HELD FOR 5 DAYS BEFORE ISSUING A FINAL NEGATIVE REPORT Performed at [facility]    Report Status PENDING  Incomplete   Urine culture     Status: None   Collection Time: 12/25/14  3:17 PM  Result Value Ref Range Status   Specimen Description URINE, CATHETERIZED  Final   Special Requests NONE  Final   Colony Count NO GROWTH Performed at [facility]   Final   Culture NO GROWTH Performed at [facility]   Final   Report Status 12/27/2014 FINAL  Final  MRSA PCR Screening     Status: None   Collection Time: 12/25/14  6:34 PM  Result Value Ref Range Status   MRSA by PCR NEGATIVE NEGATIVE Final    Comment:        The GeneXpert MRSA Assay (FDA approved for NASAL specimens only), is one component of a comprehensive MRSA colonization surveillance program. It is not intended to diagnose MRSA infection nor to guide or monitor treatment for MRSA infections.     Anti-infectives    Start     Dose/Rate Route Frequency Ordered Stop   12/27/14 1200  vancomycin (VANCOCIN) IVPB 750 mg/150 ml premix  Status:  Discontinued     750 mg 150 mL/hr over 60 Minutes Intravenous Every T-Th-Sa (Hemodialysis) 12/25/14 1247 12/25/14 1834   12/25/14 2200  piperacillin-tazobactam (ZOSYN) IVPB 2.25 g  Status:  Discontinued     2.25 g 100 mL/hr over 30 Minutes Intravenous 3 times per day 12/25/14 1247 12/25/14 1834  12/25/14 2200  ceFEPIme (MAXIPIME) 1 g in dextrose 5 % 50 mL IVPB  Status:  Discontinued     1 g 100 mL/hr over 30 Minutes Intravenous 3 times per day 12/25/14 1834 12/25/14 1843   12/25/14 2200  vancomycin (VANCOCIN) IVPB 750 mg/150 ml premix     750 mg 150 mL/hr over 60 Minutes Intravenous Every T-Th-Sa (Hemodialysis) 12/25/14 1955     12/25/14 2200  ceFEPIme (MAXIPIME) 2 g in dextrose 5 % 50 mL IVPB     2 g 100 mL/hr over 30 Minutes Intravenous Every T-Th-Sa (Hemodialysis) 12/25/14 1955     12/25/14 1900  ceFEPIme (MAXIPIME) 2 g in dextrose 5 % 50 mL IVPB  Status:  Discontinued     2 g 100 mL/hr over 30 Minutes Intravenous  Once 12/25/14 1846 12/25/14 1959   12/25/14 1300  vancomycin  (VANCOCIN) 1,500 mg in sodium chloride 0.9 % 500 mL IVPB     1,500 mg 250 mL/hr over 120 Minutes Intravenous STAT 12/25/14 1243 12/25/14 1758   12/25/14 1200  piperacillin-tazobactam (ZOSYN) IVPB 3.375 g  Status:  Discontinued     3.375 g 100 mL/hr over 30 Minutes Intravenous  Once 12/25/14 1159 12/25/14 1834   12/25/14 1200  vancomycin (VANCOCIN) IVPB 1000 mg/200 mL premix  Status:  Discontinued     1,000 mg 200 mL/hr over 60 Minutes Intravenous  Once 12/25/14 1159 12/25/14 2220      Assessment: 46 YOF with ESRD who continues on Vancomycin + Cefepime for empiric HCAP coverage. It appears as if the patient received extra Vancomycin doses on 4/26 (1500 mg + 1000 mg + 750 mg doses all charted as given = total of 3250 mg). The patient tolerated HD sessions on 4/26 evening (no maintenance dose charted as given afterwards) and 4/28 (750 mg MD given after). Next planned HD session on 4/30.  Given the possibility of extra doses and an elevated Vancomycin concentration - will discontinue standing doses and will plan to check a post-HD Vancomycin level ~4 hours after HD on 4/30. Cefepime doses remain appropriate. MD considering change to oral antibiotics soon.  Goal of Therapy:  Pre-HD Vancomycin level of 15-25 mcg/ml  Plan:  1. Hold standing Vancomycin doses - the patient is still actively being treated 2. Will plan to obtain a post-HD Vancomycin level on 4/30 if MD plans to continue 3. Continue Cefepime 2g/HD-TTS 4. Will continue to follow HD schedule/duration, culture results, LOT, and antibiotic de-escalation plans   Alycia Rossetti, PharmD, BCPS Clinical Pharmacist Pager: [phone] 12/28/2014 11:08 AM

## 2014-12-29 LAB — RENAL FUNCTION PANEL
Albumin: 2.4 g/dL — ABNORMAL LOW (ref 3.5–5.2)
Anion gap: 12 (ref 5–15)
BUN: 36 mg/dL — ABNORMAL HIGH (ref 6–23)
CHLORIDE: 98 mmol/L (ref 96–112)
CO2: 27 mmol/L (ref 19–32)
Calcium: 9.4 mg/dL (ref 8.4–10.5)
Creatinine, Ser: 6.78 mg/dL — ABNORMAL HIGH (ref 0.50–1.10)
GFR calc Af Amer: 6 mL/min — ABNORMAL LOW (ref >90)
GFR calc non Af Amer: 5 mL/min — ABNORMAL LOW (ref >90)
GLUCOSE: 113 mg/dL — AB (ref 70–99)
Phosphorus: 3.9 mg/dL (ref 2.3–4.6)
Potassium: 4.1 mmol/L (ref 3.5–5.1)
Sodium: 137 mmol/L (ref 135–145)

## 2014-12-29 LAB — CBC
HCT: 28.2 % — ABNORMAL LOW (ref 36.0–46.0)
Hemoglobin: 8.9 g/dL — ABNORMAL LOW (ref 12.0–15.0)
MCH: 26.3 pg (ref 26.0–34.0)
MCHC: 31.6 g/dL (ref 30.0–36.0)
MCV: 83.4 fL (ref 78.0–100.0)
PLATELETS: 368 10*3/uL (ref 150–400)
RBC: 3.38 MIL/uL — ABNORMAL LOW (ref 3.87–5.11)
RDW: 19.5 % — ABNORMAL HIGH (ref 11.5–15.5)
WBC: 7.3 10*3/uL (ref 4.0–10.5)

## 2014-12-29 MED ORDER — AMOXICILLIN-POT CLAVULANATE 500-125 MG PO TABS: 1.0000 | ORAL_TABLET | Freq: Every day | ORAL | Status: DC

## 2014-12-29 MED ORDER — CALCITRIOL 0.25 MCG PO CAPS: 1.7500 ug | ORAL_CAPSULE | ORAL | Status: DC | PRN

## 2014-12-29 NOTE — Discharge Instructions (Signed)

## 2014-12-29 NOTE — Progress Notes (Signed)
Patient ID: TESSI EUSTACHE, female   DOB: 1928-06-10, 81 y.o.   MRN: [id number]  [facility] Progress Note   Assessment/ Plan:   1. Altered mental status: Suspected to be from acute illness delirium-likely pneumonia. CT scan and MRI of the brain did not show CVA. Carotid Doppler reports reviewed-no significant carotid stenosis and normal vertebral artery flow. Mental status now appears to be back to baseline. 2. Pneumonia with possible sepsis: On proximal spectrum antibiotic coverage for healthcare associated pneumonia given recent hospitalization. Cultures negative to date.  3. ESRD: Hemodialysis at this time with some intradialytic blood pressure drop/pre-syncope prompting early termination of treatment today.  4. Anemia of chronic kidney disease: Hemoglobin currently at goal, resumed ESA with downtrend Hgb. 5. Metabolic bone disease: On phosphorus binders (with meals) as well as calcitriol at dialysis.  Subjective:   Reports to be feeling fair- earlier with pre-syncope on HD due to BP drop   Objective:   BP 119/34 mmHg  Pulse 71  Temp(Src) 97.6 F (36.4 C) (Oral)  Resp 27  Ht 5\' 2"  (1.575 m)  Wt 70.6 kg (155 lb 10.3 oz)  BMI 28.46 kg/m2  SpO2 99%  Physical Exam: KKX:[id number] on HD- now awake and alert RCV:ELFYB RRR, normal s1 and s2 Resp:CTA bilaterally, no rales OFB:PZWC, NT, BS normal Ext:No LE edema  Labs: BMET  Recent Labs Lab 12/25/14 0913 12/25/14 0950 12/26/14 0248 12/27/14 1700 12/28/14 0223 12/28/14 0932 12/29/14 0718  NA 137 137 135 134* 140 136 137  K 4.5 4.6 4.0 4.1 3.8 4.9 4.1  CL 93* 94* 95* 94* 101 99 98  CO2 30  --  27 27 29 26 27   GLUCOSE 117* 121* 143* 103* 156* 154* 113*  BUN 20 25* 10 40* 16 20 36*  CREATININE 9.32* 9.00* 4.97* 7.93* 4.01* 4.98* 6.78*  CALCIUM 9.0  --  9.1 8.6 9.1 9.2 9.4  PHOS  --   --   --  4.2  --  3.4 3.9   CBC  Recent Labs Lab 12/25/14 0913  12/27/14 1700 12/28/14 0223 12/28/14 0932  12/29/14 0717  WBC 12.6*  < > 10.4 8.9 9.0 7.3  NEUTROABS 9.8*  --   --  6.2  --   --   HGB 9.5*  < > 9.0* 9.3* 10.1* 8.9*  HCT 30.9*  < > 27.8* 29.6* 32.6* 28.2*  MCV 86.3  < > 83.5 84.1 87.2 83.4  PLT 378  < > 361 345 324 368  < > = values in this interval not displayed.  Medications:    . amoxicillin-clavulanate  1 tablet Oral Daily  . aspirin EC  81 mg Oral Daily  . azelastine  1 spray Each Nare Daily  . budesonide (PULMICORT) nebulizer solution  0.5 mg Nebulization BID  . calcium acetate  667 mg Oral BID AC  . gabapentin  300 mg Oral QHS  . heparin  5,000 Units Subcutaneous 3 times per day  . multivitamin  1 tablet Oral Daily  . pantoprazole  40 mg Oral Daily  . polyethylene glycol  17 g Oral Daily  . simvastatin  5 mg Oral QHS   Elmarie Shiley, MD 12/29/2014, 10:18 AM

## 2014-12-29 NOTE — Discharge Summary (Signed)
DISCHARGE SUMMARY  Annette Roach  MR#: [id number]  DOB:1927-12-05  Date of Admission: 12/25/2014 Date of Discharge: 12/29/2014  Attending Physician:Laterria Lasota T  Patient's PHX:TAVWPV,XYIA, MD  Consults: Nephrology Neurology  Disposition: Discharge home  Follow-up Appts:     Follow-up Information    Follow up with GRISSO,GREG, MD. Schedule an appointment as soon as possible for a visit in 1 week.   Specialty:  Internal Medicine   Contact information:   [place] [address] [phone]      Discharge Diagnoses: Acute toxic metabolic encephalopathy Sepsis due to right lower lobe healthcare associated pneumonia End-stage renal disease Hypertension Anemia of chronic kidney disease  Initial presentation: 81 year old woman with history significant for end-stage renal disease on hemodialysis Tue/Thur/Sat, coronary artery disease, hyperlipidemia, COPD, and stomach cancer who was brought to the hospital due to confusion. Patient had recently been hospitalized for pneumonia and given 3 days of vancomycin and Zosyn and later transitioned to Levaquin for discharge. EMS was called and she was noted to have right greater than left extremity weakness and therefore a code stroke was called. Subsequent workup was negative for CVA including MRI and CT of the head.   In the emergency department she was found to be febrile with a temp of 101. She was tachycardic into the 120s, leukocytosis of 12.6, chest x-ray with right pleural effusion.   Hospital Course:  Acute toxic metabolic encephalopathy -MRI confirmed there had not been a CVA -Suspect related to sepsis - steadily improved throughout the course of the hospital stay with patient essentially at her baseline mental status at time of discharge  Sepsis due to right lower lobe healthcare associated pneumonia -Continue empiric antibiotic therapy to complete an 8 day treatment course- clinically the patient steadily  improved - sepsis physiology resolved and the patient was hemodynamically stable at the time of discharge - transitioned to oral antibiotic prior to discharge  End-stage renal disease -On hemodialysis Tuesday Thursday Saturday. -Nephrology is following -Patient suffered a syncopal spell during dialysis on the date of her discharge but this is reportedly not a new issue for her and was cared for by the nephrologist who was present - following her dialysis her blood pressure remained stable and she had no further presyncopal/syncopal episodes - she confirmed to me that she does not live alone at the time of her discharge  Hypertension -Blood pressure is currently well-controlled  Anemia of chronic kidney disease -Hemoglobin stable - care per nephrology  Reported history of diabetes -Patient is on no diabetic medications at home - A1c was 5.0 February 2016 - no indication to follow CBGs presently    Medication List    STOP taking these medications        amLODipine 2.5 MG tablet  Commonly known as:  NORVASC     levofloxacin 250 MG tablet  Commonly known as:  LEVAQUIN     traMADol 50 MG tablet  Commonly known as:  ULTRAM      TAKE these medications        albuterol (2.5 MG/3ML) 0.083% nebulizer solution  Commonly known as:  PROVENTIL  Take 2.5 mg by nebulization every 6 (six) hours as needed for wheezing or shortness of breath.     amoxicillin-clavulanate 500-125 MG per tablet  Commonly known as:  AUGMENTIN  Take 1 tablet (500 mg total) by mouth daily.  Start taking on:  12/30/2014     aspirin EC 81 MG tablet  Take 81 mg by mouth  daily.     Azelastine HCl 0.15 % Soln  Place 1 spray into both nostrils daily.     beclomethasone 80 MCG/ACT inhaler  Commonly known as:  QVAR  Inhale 2 puffs into the lungs 2 (two) times daily.     calcitRIOL 0.25 MCG capsule  Commonly known as:  ROCALTROL  Take 7 capsules (1.75 mcg total) by mouth every dialysis (secondary  hyperparathyroidism).     calcium acetate 667 MG capsule  Commonly known as:  PHOSLO  Take 667 mg by mouth 2 (two) times daily before a meal.     feeding supplement (NEPRO CARB STEADY) Liqd  Take 237 mLs by mouth as needed (missed meal during dialysis.).     gabapentin 300 MG capsule  Commonly known as:  NEURONTIN  Take 300 mg by mouth at bedtime.     HYDROcodone-acetaminophen 10-325 MG per tablet  Commonly known as:  NORCO  Take 1 tablet by mouth every 6 (six) hours as needed for severe pain.     multivitamin Tabs tablet  Take 1 tablet by mouth daily.     nitroGLYCERIN 0.4 MG SL tablet  Commonly known as:  NITROSTAT  Place 0.4 mg under the tongue every 5 (five) minutes as needed for chest pain.     pantoprazole 40 MG tablet  Commonly known as:  PROTONIX  Take 40 mg by mouth daily.     polyethylene glycol packet  Commonly known as:  MIRALAX / GLYCOLAX  Take 17 g by mouth daily.     simvastatin 5 MG tablet  Commonly known as:  ZOCOR  Take 5 mg by mouth at bedtime.     STOOL SOFTENER PO  Take 1 tablet by mouth as needed (for constipation).        Day of Discharge BP 137/45 mmHg  Pulse 76  Temp(Src) 97.9 F (36.6 C) (Oral)  Resp 21  Ht 5\' 2"  (1.575 m)  Wt 70.6 kg (155 lb 10.3 oz)  BMI 28.46 kg/m2  SpO2 100%  Physical Exam: General: No acute respiratory distress - alert and oriented Lungs: Clear to auscultation bilaterally without wheezes or crackles Cardiovascular: Regular rate and rhythm without murmur gallop or rub normal S1 and S2 Abdomen: Nontender, nondistended, soft, bowel sounds positive, no rebound, no ascites, no appreciable mass Extremities: No significant cyanosis, clubbing, or edema bilateral lower extremities  Basic Metabolic Panel:  Recent Labs Lab 12/26/14 0248 12/27/14 1700 12/28/14 0223 12/28/14 0932 12/29/14 0718  NA 135 134* 140 136 137  K 4.0 4.1 3.8 4.9 4.1  CL 95* 94* 101 99 98  CO2 27 27 29 26 27   GLUCOSE 143* 103* 156* 154*  113*  BUN 10 40* 16 20 36*  CREATININE 4.97* 7.93* 4.01* 4.98* 6.78*  CALCIUM 9.1 8.6 9.1 9.2 9.4  MG  --   --  1.9  --   --   PHOS  --  4.2  --  3.4 3.9    Liver Function Tests:  Recent Labs Lab 12/25/14 0913 12/27/14 1700 12/28/14 0223 12/28/14 0932 12/29/14 0718  AST 16  --  18  --   --   ALT 8  --  8  --   --   ALKPHOS 97  --  86  --   --   BILITOT 0.7  --  0.6  --   --   PROT 8.3  --  8.1  --   --   ALBUMIN 2.7* 2.3* 2.5* 2.7* 2.4*  Coags:  Recent Labs Lab 12/25/14 0913  INR 1.23   CBC:  Recent Labs Lab 12/25/14 0913  12/26/14 0248 12/27/14 1700 12/28/14 0223 12/28/14 0932 12/29/14 0717  WBC 12.6*  --  14.2* 10.4 8.9 9.0 7.3  NEUTROABS 9.8*  --   --   --  6.2  --   --   HGB 9.5*  < > 10.1* 9.0* 9.3* 10.1* 8.9*  HCT 30.9*  < > 33.1* 27.8* 29.6* 32.6* 28.2*  MCV 86.3  --  87.8 83.5 84.1 87.2 83.4  PLT 378  --  345 361 345 324 368  < > = values in this interval not displayed.  CBG:  Recent Labs Lab 12/26/14 0059 12/26/14 0816  GLUCAP 122* 119*    Recent Results (from the past 240 hour(s))  Blood Culture (routine x 2)     Status: None (Preliminary result)   Collection Time: 12/25/14  1:30 PM  Result Value Ref Range Status   Specimen Description BLOOD LEFT WRIST  Final   Special Requests BOTTLES DRAWN AEROBIC AND ANAEROBIC 5CC  Final   Culture   Final           BLOOD CULTURE RECEIVED NO GROWTH TO DATE CULTURE WILL BE HELD FOR 5 DAYS BEFORE ISSUING A FINAL NEGATIVE REPORT Performed at [facility]    Report Status PENDING  Incomplete  Blood Culture (routine x 2)     Status: None (Preliminary result)   Collection Time: 12/25/14  1:55 PM  Result Value Ref Range Status   Specimen Description BLOOD HAND LEFT  Final   Special Requests BOTTLES DRAWN AEROBIC AND ANAEROBIC 5CC  Final   Culture   Final           BLOOD CULTURE RECEIVED NO GROWTH TO DATE CULTURE WILL BE HELD FOR 5 DAYS BEFORE ISSUING A FINAL NEGATIVE REPORT Performed at [facility]    Report Status PENDING  Incomplete  Urine culture     Status: None   Collection Time: 12/25/14  3:17 PM  Result Value Ref Range Status   Specimen Description URINE, CATHETERIZED  Final   Special Requests NONE  Final   Colony Count NO GROWTH Performed at [facility]   Final   Culture NO GROWTH Performed at [facility]   Final   Report Status 12/27/2014 FINAL  Final  MRSA PCR Screening     Status: None   Collection Time: 12/25/14  6:34 PM  Result Value Ref Range Status   MRSA by PCR NEGATIVE NEGATIVE Final    Comment:        The GeneXpert MRSA Assay (FDA approved for NASAL specimens only), is one component of a comprehensive MRSA colonization surveillance program. It is not intended to diagnose MRSA infection nor to guide or monitor treatment for MRSA infections.       Time spent in discharge (includes decision making & examination of pt): >35 minutes  12/29/2014, 2:27 PM   Cherene Altes, MD [facility] Office  [phone] Pager [phone]  On-Call/Text Page:      [email or web address]      password [facility]

## 2014-12-29 NOTE — Procedures (Signed)
Patient seen on Hemodialysis. QB 400, UF goal 2L Treatment adjusted as needed.  Elmarie Shiley MD [facility]. Office # [phone] Pager # [phone] 10:17 AM

## 2014-12-31 LAB — CULTURE, BLOOD (ROUTINE X 2)
CULTURE: NO GROWTH
Culture: NO GROWTH

## 2014-12-31 NOTE — Care Management Note (Signed)
    Page 1 of 1   12/31/2014     9:40:21 AM CARE MANAGEMENT NOTE 12/31/2014  Patient:  Annette Roach, Annette Roach   Account Number:  [id number]  Date Initiated:  12/28/2014  Documentation initiated by:  Daschel Roughton  Subjective/Objective Assessment:   dx encephalopathy/sepsis/PNA; lives with dtr, active with [facility] for RN/PT services    PCP  Gilford Rile     Action/Plan:   Anticipated DC Date:  12/29/2014   Anticipated DC Plan:  [place]  CM consult      Choice offered to / List presented to:          [facility] arranged  HH-2 PT  HH-1 RN      [facility] agency  [place]   Status of service:  Completed, signed off Medicare Important Message given?  YES (If response is "NO", the following Medicare IM given date fields will be blank) Date Medicare IM given:  12/28/2014 Medicare IM given by:  Stephani Janak Date Additional Medicare IM given:   Additional Medicare IM given by:    Discharge Disposition:  [place]  Per UR Regulation:  Reviewed for med. necessity/level of care/duration of stay  If discussed at [place] of Stay Meetings, dates discussed:    Comments:

## 2015-01-14 ENCOUNTER — Inpatient Hospital Stay (HOSPITAL_COMMUNITY)
Admission: AD | Admit: 2015-01-14 | Discharge: 2015-01-16 | DRG: 640 | Disposition: A | Discharge status: Home / Self Care | Payer: Medicare Other | Source: Other Acute Inpatient Hospital | Attending: Internal Medicine | Admitting: Internal Medicine

## 2015-01-14 DIAGNOSIS — N189 Chronic kidney disease, unspecified: Secondary | ICD-10-CM | POA: Diagnosis not present

## 2015-01-14 DIAGNOSIS — Z87442 Personal history of urinary calculi: Secondary | ICD-10-CM

## 2015-01-14 DIAGNOSIS — Z85028 Personal history of other malignant neoplasm of stomach: Secondary | ICD-10-CM

## 2015-01-14 DIAGNOSIS — I10 Essential (primary) hypertension: Secondary | ICD-10-CM

## 2015-01-14 DIAGNOSIS — G629 Polyneuropathy, unspecified: Secondary | ICD-10-CM | POA: Diagnosis present

## 2015-01-14 DIAGNOSIS — M1990 Unspecified osteoarthritis, unspecified site: Secondary | ICD-10-CM | POA: Diagnosis not present

## 2015-01-14 DIAGNOSIS — I2510 Atherosclerotic heart disease of native coronary artery without angina pectoris: Secondary | ICD-10-CM | POA: Diagnosis present

## 2015-01-14 DIAGNOSIS — Z7982 Long term (current) use of aspirin: Secondary | ICD-10-CM

## 2015-01-14 DIAGNOSIS — R059 Cough, unspecified: Secondary | ICD-10-CM

## 2015-01-14 DIAGNOSIS — I120 Hypertensive chronic kidney disease with stage 5 chronic kidney disease or end stage renal disease: Secondary | ICD-10-CM | POA: Diagnosis present

## 2015-01-14 DIAGNOSIS — J811 Chronic pulmonary edema: Secondary | ICD-10-CM

## 2015-01-14 DIAGNOSIS — D631 Anemia in chronic kidney disease: Secondary | ICD-10-CM

## 2015-01-14 DIAGNOSIS — R51 Headache: Secondary | ICD-10-CM | POA: Diagnosis not present

## 2015-01-14 DIAGNOSIS — R0602 Shortness of breath: Secondary | ICD-10-CM | POA: Diagnosis present

## 2015-01-14 DIAGNOSIS — F329 Major depressive disorder, single episode, unspecified: Secondary | ICD-10-CM | POA: Diagnosis not present

## 2015-01-14 DIAGNOSIS — E213 Hyperparathyroidism, unspecified: Secondary | ICD-10-CM | POA: Diagnosis present

## 2015-01-14 DIAGNOSIS — K219 Gastro-esophageal reflux disease without esophagitis: Secondary | ICD-10-CM | POA: Diagnosis not present

## 2015-01-14 DIAGNOSIS — I509 Heart failure, unspecified: Secondary | ICD-10-CM | POA: Diagnosis not present

## 2015-01-14 DIAGNOSIS — Z66 Do not resuscitate: Secondary | ICD-10-CM | POA: Diagnosis not present

## 2015-01-14 DIAGNOSIS — J449 Chronic obstructive pulmonary disease, unspecified: Secondary | ICD-10-CM | POA: Diagnosis present

## 2015-01-14 DIAGNOSIS — K5900 Constipation, unspecified: Secondary | ICD-10-CM | POA: Diagnosis not present

## 2015-01-14 DIAGNOSIS — E8770 Fluid overload, unspecified: Principal | ICD-10-CM | POA: Diagnosis present

## 2015-01-14 DIAGNOSIS — Z992 Dependence on renal dialysis: Secondary | ICD-10-CM

## 2015-01-14 DIAGNOSIS — E119 Type 2 diabetes mellitus without complications: Secondary | ICD-10-CM | POA: Diagnosis present

## 2015-01-14 DIAGNOSIS — E785 Hyperlipidemia, unspecified: Secondary | ICD-10-CM | POA: Diagnosis not present

## 2015-01-14 DIAGNOSIS — R05 Cough: Secondary | ICD-10-CM

## 2015-01-14 DIAGNOSIS — J45909 Unspecified asthma, uncomplicated: Secondary | ICD-10-CM | POA: Diagnosis present

## 2015-01-14 DIAGNOSIS — J189 Pneumonia, unspecified organism: Secondary | ICD-10-CM | POA: Diagnosis present

## 2015-01-14 DIAGNOSIS — Z90710 Acquired absence of both cervix and uterus: Secondary | ICD-10-CM

## 2015-01-14 DIAGNOSIS — R0902 Hypoxemia: Secondary | ICD-10-CM | POA: Diagnosis not present

## 2015-01-14 DIAGNOSIS — N186 End stage renal disease: Secondary | ICD-10-CM

## 2015-01-14 DIAGNOSIS — Z8701 Personal history of pneumonia (recurrent): Secondary | ICD-10-CM | POA: Diagnosis not present

## 2015-01-14 DIAGNOSIS — Z09 Encounter for follow-up examination after completed treatment for conditions other than malignant neoplasm: Secondary | ICD-10-CM

## 2015-01-14 LAB — GLUCOSE, CAPILLARY: GLUCOSE-CAPILLARY: 204 mg/dL — AB (ref 65–99)

## 2015-01-14 MED ORDER — SODIUM CHLORIDE 0.9 % IJ SOLN
3.0000 mL | Freq: Two times a day (BID) | INTRAMUSCULAR | Status: DC
  Administered 2015-01-14 – 2015-01-16 (×4): 3 mL via INTRAVENOUS

## 2015-01-14 MED ORDER — LEVOFLOXACIN IN D5W 500 MG/100ML IV SOLN
500.0000 mg | INTRAVENOUS | Status: DC
  Filled 2015-01-14: qty 100

## 2015-01-14 MED ORDER — CALCITRIOL 0.5 MCG PO CAPS
1.7500 ug | ORAL_CAPSULE | ORAL | Status: DC
  Administered 2015-01-15: 1.75 ug via ORAL
  Filled 2015-01-14 (×2): qty 1

## 2015-01-14 MED ORDER — ASPIRIN EC 81 MG PO TBEC
81.0000 mg | DELAYED_RELEASE_TABLET | Freq: Every day | ORAL | Status: DC
  Administered 2015-01-15 – 2015-01-16 (×2): 81 mg via ORAL
  Filled 2015-01-14 (×2): qty 1

## 2015-01-14 MED ORDER — SODIUM CHLORIDE 0.9 % IV SOLN: 250.0000 mL | INTRAVENOUS | Status: DC | PRN

## 2015-01-14 MED ORDER — SODIUM CHLORIDE 0.9 % IJ SOLN: 3.0000 mL | INTRAMUSCULAR | Status: DC | PRN

## 2015-01-14 MED ORDER — SODIUM CHLORIDE 0.9 % IV SOLN: 125.0000 mg | INTRAVENOUS | Status: DC

## 2015-01-14 NOTE — Consult Note (Signed)
Requesting Physician:  Dr. Hal Hope Reason for Consult:  Pt with ESRD in need of dialysis and management of other renal related issues HPI: The patient is a 81 y.o. year-old AAF who undergoes dialysis at the [facility] on a TTS schedule who is transferred from [facility] ED with hypoxemia, fever, leukocytosis and edema on CXR. We were called in the event that urgent dialysis was needed on arrival.   She has a past medical history significant for hypertension, type 2 diabetes mellitus, COPD and low-grade stomach cancer. She has had 2 admissions since March for PNA (3/15-3/22 - LLL and 4/26-4/30 RLL HCAP). Each time was treated with antibiotics. Should have finished last round of ATB's po on around May 1.  She was last dialyzed on her usual schedule on Saturday for her full 3.5 hours, reached her EDW of 70 kg  (gains are usually small - a kg or sl more). She denies any edema or SOB on the day of dialysis.  She herself is a little vague on the history, but says SOB for a couple of days. Daughter says she slept all day today then complained of SOB and was feverish. Also may have been a little confused.    She presented to [facility] ED with the above complaints. Had low grade temp of 100.2  Labs from [place] included  ABG with PO2 of 43, WBC of 12,900, Hb of 9.4, K of 4.6, bicarb of 28 and CXR with edema but no definite infiltrate.  At the time of my communication with [facility] EDP she was comfortable on 2 liters of oxygen, and on arrival remains comfortable with sats 100% on 2 liters at the time of my exam. She was given 750 mg of levaquin in the ED prior to transfer out of the concern for possible PNA, given her recent history.   Past Medical History  Diagnosis Date  . COPD (chronic obstructive pulmonary disease)     emphysema  . Neuropathy   . Hyperlipidemia   . Diabetes mellitus   . Depression   . Anemia   . Shortness of breath   . GERD (gastroesophageal reflux disease)    . Hyperparathyroidism   . Hypertension     sees Dr. Gilford Rile  . Headache(784.0)   . Arthritis   . CAD (coronary artery disease)     sees Dr. Jenne Campus, [facility], [place]  . 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 Wears glasses   . Wears hearing aid     right  . Presence of surgically created AV shunt for hemodialysis     old lt upper arm out-rt upper arm shunt in  . Anginal pain     Dr Raliegh Ip in [place], [place]  . History of kidney stones   . Constipation   . History of blood transfusion   . Asthma     years ago  . Chronic kidney disease     TTH SAT [place]- Hemo     Past Surgical History  Procedure Laterality Date  . Abdominal hysterectomy  1976  . Kidney stone surgery  2013  . Av fistula placement  01/20/2012    Procedure: ARTERIOVENOUS (AV) FISTULA CREATION;  Surgeon: Elam Dutch, MD;  Location: [place];  Service: Vascular;  Laterality: Left;  . Hemodialysis catheter  05/25/12    secondary to failed AVF   . Eye surgery      bilateral cataract removed  .  Av fistula placement  09/05/2012    Procedure: INSERTION OF ARTERIOVENOUS (AV) GORE-TEX GRAFT ARM;  Surgeon: Elam Dutch, MD;  [facility];  Service: Vascular;  Laterality: Left;  Using 4-19mm x 45 cm Vascular stretch goretex graft  . Ligation of arteriovenous  fistula  09/05/2012    Procedure: LIGATION OF ARTERIOVENOUS  FISTULA;  Surgeon: Elam Dutch, MD;  Location: [place];  Service: Vascular;  Laterality: Left;  . Ligation arteriovenous gortex graft  09/05/2012    Procedure: LIGATION ARTERIOVENOUS GORTEX GRAFT;  Surgeon: Elam Dutch, MD;  Location: [place];  Service: Vascular;  Laterality: Left;  . Av fistula placement Right 11/07/2012    Procedure: ARTERIOVENOUS (AV) FISTULA CREATION;  Surgeon: Elam Dutch, MD;  Location: [facility] OR;  Service: Vascular;  Laterality: Right;  Bascilic Vein Fistula  . Appendectomy    . Carpal tunnel release Left 02/10/2013     Procedure: CARPAL TUNNEL RELEASE;  Surgeon: Cammie Sickle., MD;  Location: [place];  Service: Orthopedics;  Laterality: Left;  . Bascilic vein transposition Right 03/15/2013    Procedure: 2ND STAGE BASCILIC VEIN TRANSPOSITION - RIGHT ARM;  Surgeon: Elam Dutch, MD;  Location: [place];  Service: Vascular;  Laterality: Right;  . Av fistula placement Right 05/15/2013    Procedure: INSERTION OF ARTERIOVENOUS (AV) GORE-TEX GRAFT ARM-RIGHT;  Surgeon: Elam Dutch, MD;  Location: [place];  Service: Vascular;  Laterality: Right;  . Shuntogram N/A 07/27/2012    Procedure: Earney Mallet;  Surgeon: Rosetta Posner, MD;  Location: [facility] CATH LAB;  Service: Cardiovascular;  Laterality: N/A;    Family History  Problem Relation Age of Onset  . Diabetes Mother   . Cancer Father   . Deep vein thrombosis Brother   . Hypertension Brother    Social History:  reports that she has never smoked. She has never used smokeless tobacco. She reports that she does not drink alcohol or use illicit drugs.  Allergies: No Known Allergies  Home medications: Med Rec pending  Inpatient medications: Orders pending  Review of Systems: Negative except as in HPI  Physical Exam:  BP 124/62 mmHg  Pulse 90  Temp(Src) 98.3 F (36.8 C)  Resp 20  SpO2 100%  Gen: Delightful 81 yo female lying comfortably in bed, wearing O2 at 2 liters, O2 sats 100% Skin: no rash, cyanosis Neck: no JVD, no bruits or LAN Chest: Grossly clear anteriorly Heart: regular, no rub or gallop. 1/6 murmur USB Abdomen: soft, no focal abdominal tenderness Ext: Trace if any pretibial edema. Right upper arm AVG with + bruit and thrill Neuro: alert, Ox3, knows she is at [facility], recognizes me (though does not recall my name - but I don't see her that much) Heme/Lymph: no bruising or LAN  Labs: SEE HPI for labs done at [facility] ED  Dialysis Prescription TTS AKC 3.5 hours EDW 70 kg WILL HAVE LOWER EDW AT D/C 160 NR dialyzer 2K 2Ca  bath UF profile 2 5000 units heparin Venofer 50 mg/week Mircera 150 mcg every 2 weeks (last dose 01/08/15) Calcitriol 1.75 mcg every TTS with HD Access: AVG   Impression/Plan 1. SOB/hypoxemia - CXR by report at outside hospital "CHF" but still concern about infection given elevated WBC, fever at outside ER to 100.2 and 2 episodes of PNA since March 2016. Rec'd levaquin at [facility] ER out of concern for infection. Volume status does not seem overwhelmingly positive based on physical exam. She is comfortable on 2  liters of O2 with sats 100%. I think she is fine to wait until AM for dialysis on her usual schedule. Repeat CXR. MAY need lower EDW. 2. ESRD - TTS . I think as above - fine to wait for HD until the AM. No acute need tonight. 3. Secondary hyperpara - Continue calcitriol with HD 4. Anemia - Receives Mircera at [facility]. Last dose was 5/10. Not due this week.  5. COPD - does not require O2 at home. Not sure what baseline is. 6. Low grade gastric tumor - not a big issue re this current admission - ? GIST per prior notes.  7. h/o vasovagal syncope - past adm. EF 45-50%. Watch for but not issue at present 8. DM2 - diet  Jamal Maes, MD [facility] [phone] Pager 01/14/2015, 10:16 PM        Jamal Maes,  MD [facility] [phone] pager 01/14/2015, 8:35 PM

## 2015-01-14 NOTE — H&P (Signed)
PCP:   Gilford Rile, MD   Chief Complaint:  sob  HPI: 81 yo female ESRD dialysis on t/r/sat last dialysis on sat comes in with sob that was worse than normal today.  She was transferred from [facility] ED for evaluation of need for dialysis.  Pt denies any fevers, no cough.  No swelling.  No n/v/d.  She is on 2liters oxygen and is feeling better.  She denies any chest pain.  She lives with her daughter.  Review of [facility] records, cxr showed some edema.  Trop neg.  Bun/cr 36/7.9.  hgb 9.4, wbc 12.9, k 4.6.  ekg no acute issues rbbb and lafb.  Pt was on ns at 125cc/hour, levaquin given.  Temp was 100.2 other vitals normal.    Review of Systems:  Positive and negative as per HPI otherwise all other systems are negative  Past Medical History: Past Medical History  Diagnosis Date  . COPD (chronic obstructive pulmonary disease)     emphysema  . Neuropathy   . Hyperlipidemia   . Diabetes mellitus   . Depression   . Anemia   . Shortness of breath   . GERD (gastroesophageal reflux disease)   . Hyperparathyroidism   . Hypertension     sees Dr. Gilford Rile  . Headache(784.0)   . Arthritis   . CAD (coronary artery disease)     sees Dr. Jenne Campus, [facility] cardiology [facility], [place]  . 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 Wears glasses   . Wears hearing aid     right  . Presence of surgically created AV shunt for hemodialysis     old lt upper arm out-rt upper arm shunt in  . Anginal pain     Dr Raliegh Ip in [place], [place]  . History of kidney stones   . Constipation   . History of blood transfusion   . Asthma     years ago  . Chronic kidney disease     TTH SAT [place] - Hemo   Past Surgical History  Procedure Laterality Date  . Abdominal hysterectomy  1976  . Kidney stone surgery  2013  . Av fistula placement  01/20/2012    Procedure: ARTERIOVENOUS (AV) FISTULA CREATION;  Surgeon: Elam Dutch, MD;  Location: [place];  Service: Vascular;   Laterality: Left;  . Hemodialysis catheter  05/25/12    secondary to failed AVF   . Eye surgery      bilateral cataract removed  . Av fistula placement  09/05/2012    Procedure: INSERTION OF ARTERIOVENOUS (AV) GORE-TEX GRAFT ARM;  Surgeon: Elam Dutch, MD;  [facility];  Service: Vascular;  Laterality: Left;  Using 4-33mm x 45 cm Vascular stretch goretex graft  . Ligation of arteriovenous  fistula  09/05/2012    Procedure: LIGATION OF ARTERIOVENOUS  FISTULA;  Surgeon: Elam Dutch, MD;  Location: [place];  Service: Vascular;  Laterality: Left;  . Ligation arteriovenous gortex graft  09/05/2012    Procedure: LIGATION ARTERIOVENOUS GORTEX GRAFT;  Surgeon: Elam Dutch, MD;  Location: [place];  Service: Vascular;  Laterality: Left;  . Av fistula placement Right 11/07/2012    Procedure: ARTERIOVENOUS (AV) FISTULA CREATION;  Surgeon: Elam Dutch, MD;  Location: [facility] OR;  Service: Vascular;  Laterality: Right;  Bascilic Vein Fistula  . Appendectomy    . Carpal tunnel release Left 02/10/2013    Procedure: CARPAL TUNNEL RELEASE;  Surgeon: Cammie Sickle.,  MD;  Location: [place];  Service: Orthopedics;  Laterality: Left;  . Bascilic vein transposition Right 03/15/2013    Procedure: 2ND STAGE BASCILIC VEIN TRANSPOSITION - RIGHT ARM;  Surgeon: Elam Dutch, MD;  Location: [place];  Service: Vascular;  Laterality: Right;  . Av fistula placement Right 05/15/2013    Procedure: INSERTION OF ARTERIOVENOUS (AV) GORE-TEX GRAFT ARM-RIGHT;  Surgeon: Elam Dutch, MD;  Location: [place];  Service: Vascular;  Laterality: Right;  . Shuntogram N/A 07/27/2012    Procedure: Earney Mallet;  Surgeon: Rosetta Posner, MD;  Location: [facility] CATH LAB;  Service: Cardiovascular;  Laterality: N/A;    Medications: Prior to Admission medications   Medication Sig Start Date End Date Taking? Authorizing Provider  albuterol (PROVENTIL) (2.5 MG/3ML) 0.083% nebulizer solution Take 2.5 mg by nebulization every  6 (six) hours as needed for wheezing or shortness of breath.    Historical Provider, MD  amoxicillin-clavulanate (AUGMENTIN) 500-125 MG per tablet Take 1 tablet (500 mg total) by mouth daily. 12/30/14   Cherene Altes, MD  aspirin EC 81 MG tablet Take 81 mg by mouth daily.    Historical Provider, MD  Azelastine HCl 0.15 % SOLN Place 1 spray into both nostrils daily.  10/10/14   Historical Provider, MD  beclomethasone (QVAR) 80 MCG/ACT inhaler Inhale 2 puffs into the lungs 2 (two) times daily.    Historical Provider, MD  calcitRIOL (ROCALTROL) 0.25 MCG capsule Take 7 capsules (1.75 mcg total) by mouth every dialysis (secondary hyperparathyroidism). 12/29/14   Cherene Altes, MD  calcium acetate (PHOSLO) 667 MG capsule Take 667 mg by mouth 2 (two) times daily before a meal.     Historical Provider, MD  Docusate Calcium (STOOL SOFTENER PO) Take 1 tablet by mouth as needed (for constipation).    Historical Provider, MD  gabapentin (NEURONTIN) 300 MG capsule Take 300 mg by mouth at bedtime.    Historical Provider, MD  HYDROcodone-acetaminophen (NORCO) 10-325 MG per tablet Take 1 tablet by mouth every 6 (six) hours as needed for severe pain. 10/15/14   Carmin Muskrat, MD  multivitamin (RENA-VIT) TABS tablet Take 1 tablet by mouth daily.    Historical Provider, MD  nitroGLYCERIN (NITROSTAT) 0.4 MG SL tablet Place 0.4 mg under the tongue every 5 (five) minutes as needed for chest pain.    Historical Provider, MD  Nutritional Supplements (FEEDING SUPPLEMENT, NEPRO CARB STEADY,) LIQD Take 237 mLs by mouth as needed (missed meal during dialysis.). 11/20/14   Belkys A Regalado, MD  pantoprazole (PROTONIX) 40 MG tablet Take 40 mg by mouth daily.    Historical Provider, MD  polyethylene glycol (MIRALAX / GLYCOLAX) packet Take 17 g by mouth daily.    Historical Provider, MD  simvastatin (ZOCOR) 5 MG tablet Take 5 mg by mouth at bedtime.     Historical Provider, MD    Allergies:  No Known Allergies  Social  History:  reports that she has never smoked. She has never used smokeless tobacco. She reports that she does not drink alcohol or use illicit drugs.  Family History: Family History  Problem Relation Age of Onset  . Diabetes Mother   . Cancer Father   . Deep vein thrombosis Brother   . Hypertension Brother     Physical Exam: Filed Vitals:   01/14/15 2152  BP: 124/62  Pulse: 90  Temp: 98.3 F (36.8 C)  Resp: 20  SpO2: 100%   General appearance: alert, cooperative and no distress Head: Normocephalic,  without obvious abnormality, atraumatic Eyes: negative Nose: Nares normal. Septum midline. Mucosa normal. No drainage or sinus tenderness. Neck: no JVD and supple, symmetrical, trachea midline Lungs: clear to auscultation bilaterally Heart: regular rate and rhythm, S1, S2 normal, no murmur, click, rub or gallop Abdomen: soft, non-tender; bowel sounds normal; no masses,  no organomegaly Extremities: extremities normal, atraumatic, no cyanosis or edema Pulses: 2+ and symmetric Skin: Skin color, texture, turgor normal. No rashes or lesions Neurologic: Grossly normal    Labs on Admission:    Reports reviewed from [place]    Radiological Exams on Admission:  12 lead ekg reviewed from [place] by myself  Assessment/Plan  81 yo female dialysis patient with sob and mild hypoxia  Principal Problem:   SOB (shortness of breath)- no emergent need for dialysis tonight, proceed in the am.  Cover with levaquin IV, repeat cxr is pending ordered by nephrology.  Lungs are clear.  Stop ivf (on kvo now).  Bag hanging from [place] and less than half has been given.  Monitor closely for any worsening respiratory status but is stable now.  Repeat 12 lead ekg ordered.   Active Problems:   End stage renal disease on dialysis-  nephro following.   Hypertension   CHF (congestive heart failure)-  Last echo 3/16 ef was 45%, will not repeat at this time.   Anemia in chronic kidney disease-   stable  Med rec pending.  obs on renal floor.  Dialysis in am.  DNR.   Cleotilde Spadaccini A 01/14/2015, 10:45 PM

## 2015-01-15 ENCOUNTER — Observation Stay (HOSPITAL_COMMUNITY): Payer: Medicare Other

## 2015-01-15 ENCOUNTER — Inpatient Hospital Stay (HOSPITAL_COMMUNITY): Payer: Medicare Other

## 2015-01-15 DIAGNOSIS — Z992 Dependence on renal dialysis: Secondary | ICD-10-CM | POA: Diagnosis not present

## 2015-01-15 DIAGNOSIS — N186 End stage renal disease: Secondary | ICD-10-CM | POA: Diagnosis not present

## 2015-01-15 DIAGNOSIS — R05 Cough: Secondary | ICD-10-CM

## 2015-01-15 DIAGNOSIS — R0602 Shortness of breath: Secondary | ICD-10-CM | POA: Diagnosis not present

## 2015-01-15 LAB — BASIC METABOLIC PANEL
Anion gap: 15 (ref 5–15)
BUN: 37 mg/dL — AB (ref 6–20)
CALCIUM: 8.3 mg/dL — AB (ref 8.9–10.3)
CO2: 29 mmol/L (ref 22–32)
Chloride: 90 mmol/L — ABNORMAL LOW (ref 101–111)
Creatinine, Ser: 8.72 mg/dL — ABNORMAL HIGH (ref 0.44–1.00)
GFR calc Af Amer: 4 mL/min — ABNORMAL LOW (ref >60)
GFR, EST NON AFRICAN AMERICAN: 4 mL/min — AB (ref >60)
GLUCOSE: 96 mg/dL (ref 65–99)
Potassium: 4.2 mmol/L (ref 3.5–5.1)
Sodium: 134 mmol/L — ABNORMAL LOW (ref 135–145)

## 2015-01-15 LAB — CBC
HEMATOCRIT: 26.1 % — AB (ref 36.0–46.0)
HEMOGLOBIN: 8.2 g/dL — AB (ref 12.0–15.0)
MCH: 27.1 pg (ref 26.0–34.0)
MCHC: 31.4 g/dL (ref 30.0–36.0)
MCV: 86.1 fL (ref 78.0–100.0)
Platelets: 269 10*3/uL (ref 150–400)
RBC: 3.03 MIL/uL — ABNORMAL LOW (ref 3.87–5.11)
RDW: 18.3 % — AB (ref 11.5–15.5)
WBC: 12.3 10*3/uL — ABNORMAL HIGH (ref 4.0–10.5)

## 2015-01-15 LAB — MRSA PCR SCREENING: MRSA by PCR: NEGATIVE

## 2015-01-15 LAB — GLUCOSE, CAPILLARY
Glucose-Capillary: 92 mg/dL (ref 65–99)
Glucose-Capillary: 81 mg/dL (ref 65–99)
Glucose-Capillary: 194 mg/dL — ABNORMAL HIGH (ref 65–99)

## 2015-01-15 MED ORDER — CALCIUM ACETATE (PHOS BINDER) 667 MG PO CAPS
667.0000 mg | ORAL_CAPSULE | Freq: Two times a day (BID) | ORAL | Status: DC
  Administered 2015-01-15 – 2015-01-16 (×3): 667 mg via ORAL
  Filled 2015-01-15 (×2): qty 1

## 2015-01-15 MED ORDER — CETYLPYRIDINIUM CHLORIDE 0.05 % MT LIQD
7.0000 mL | Freq: Two times a day (BID) | OROMUCOSAL | Status: DC
  Administered 2015-01-15 – 2015-01-16 (×3): 7 mL via OROMUCOSAL

## 2015-01-15 NOTE — Progress Notes (Signed)
[facility] PROGRESS NOTE  Annette Roach TKW:[id number] DOB: Jul 23, 1928 DOA: 01/14/2015 PCP: Gilford Rile, MD  Assessment/Plan: 81 y/o female with PMH of HTN, ESRD on HD, CHF, COPD, recent sepsis/pneumonias in march, April (2016) presented from [place] with fever 100.2, cough, SOB. CXR: edema, no clear infiltrates.  leukocytosis 12.9k on admission    1. SOB, cough. ? Chronic COPD vs mild fluid overload on admission. H/o recurrent pneumonia. Pt reports mild productive cough. Remains afebrile.  -will obtain repeat CXR after HD. Cont levofloxacin for bronchitis. Monitor for 24-48 hrs  2. ESRD. Fluid mild fluid overload on admission. On HD. Cont per nephrology     Code Status: full Family Communication: d/w patient, called her daughter: no answer. Try later  (indicate person spoken with, relationship, and if by phone, the number) Disposition Plan: monitor 24-48 hrs    Consultants:  Nephrology   Procedures:  HD  Antibiotics:  Levofloxacin 5/16 (indicate start date, and stop date if known)  HPI/Subjective: Alert, no distress   Objective: Filed Vitals:   01/15/15 0802  BP: 156/67  Pulse: 90  Temp:   Resp: 16    Intake/Output Summary (Last 24 hours) at 01/15/15 0824 Last data filed at 01/15/15 0600  Gross per 24 hour  Intake      3 ml  Output      0 ml  Net      3 ml   Filed Weights   01/15/15 0500 01/15/15 0705  Weight: 70.4 kg (155 lb 3.3 oz) 70.9 kg (156 lb 4.9 oz)    Exam:   General:  alert  Cardiovascular: s1,s2 rrr  Respiratory: few crackles LL  Abdomen: soft, nt, nd   Musculoskeletal: no leg edema   Data Reviewed: Basic Metabolic Panel:  Recent Labs Lab 01/15/15 0727  NA 134*  K 4.2  CL 90*  CO2 29  GLUCOSE 96  BUN 37*  CREATININE 8.72*  CALCIUM 8.3*   Liver Function Tests: No results for input(s): AST, ALT, ALKPHOS, BILITOT, PROT, ALBUMIN in the last 168 hours. No results for input(s): LIPASE, AMYLASE in the last 168  hours. No results for input(s): AMMONIA in the last 168 hours. CBC:  Recent Labs Lab 01/15/15 0727  WBC 12.3*  HGB 8.2*  HCT 26.1*  MCV 86.1  PLT 269   Cardiac Enzymes: No results for input(s): CKTOTAL, CKMB, CKMBINDEX, TROPONINI in the last 168 hours. BNP (last 3 results) No results for input(s): BNP in the last 8760 hours.  ProBNP (last 3 results) No results for input(s): PROBNP in the last 8760 hours.  CBG:  Recent Labs Lab 01/14/15 2204  GLUCAP 204*    Recent Results (from the past 240 hour(s))  MRSA PCR Screening     Status: None   Collection Time: 01/14/15 11:58 PM  Result Value Ref Range Status   MRSA by PCR NEGATIVE NEGATIVE Final    Comment:        The GeneXpert MRSA Assay (FDA approved for NASAL specimens only), is one component of a comprehensive MRSA colonization surveillance program. It is not intended to diagnose MRSA infection nor to guide or monitor treatment for MRSA infections.      Studies: No results found.  Scheduled Meds: . antiseptic oral rinse  7 mL Mouth Rinse BID  . aspirin EC  81 mg Oral Daily  . calcitRIOL  1.75 mcg Oral Q T,Th,Sa-HD  . [START ON 01/17/2015] ferric gluconate (FERRLECIT/NULECIT) IV  125 mg Intravenous Q Thu-HD  . [  START ON 01/16/2015] levofloxacin (LEVAQUIN) IV  500 mg Intravenous Q48H  . sodium chloride  3 mL Intravenous Q12H   Continuous Infusions:   Principal Problem:   SOB (shortness of breath) Active Problems:   End stage renal disease on dialysis   Hypertension   CHF (congestive heart failure)   Anemia in chronic kidney disease   Hypoxia    Time spent: >35 minutes     Kinnie Feil  [facility] Pager [phone]. If 7PM-7AM, please contact night-coverage at [email or web address], password [facility] 01/15/2015, 8:24 AM  LOS: 1 day

## 2015-01-15 NOTE — Progress Notes (Signed)
New Admission Note:   Arrival: Transferred from [facility] ED on stretcher Mental Orientation: A&Ox4 Telemetry: None ordered Assessment:  See doc flowsheet Skin: intact IV: Left hand IV, infusing NS at [facility] upon arrival Pain: None Safety Measures:  Call bell placed within reach; patient instructed on use of call bell and verbalized understanding. Bed in lowest position.  Non-skid socks on.  Bed alarm on. [address] [place] Orientation: Patient oriented to staff, room, and unit. Family: Daughter at bedside  Orders have been reviewed and implemented. Will continue to monitor.  Arlyss Queen, RN, BSN

## 2015-01-15 NOTE — Progress Notes (Signed)
  [facility] Progress Note   Subjective: feeling much better   Filed Vitals:   01/15/15 0802 01/15/15 0830 01/15/15 0900 01/15/15 0929  BP: 156/67 168/71 150/56 161/60  Pulse: 90 95 92 92  Temp:      Resp: 16 16 16 16   Height:      Weight:      SpO2:       Exam: Alert, no distress, calm No jvd Chest rales L base, R clear RRR no MRG ABd soft, no ascites No LE edema Neuro is alert, Ox 3  HD: TTS AKC 3.5h  70kg   F160   2/2 bath  P2   Heparin 5000   AVG Venofer 50/wk   Mircera 150 q 2wk last 5/10  Calcitriol 1.75ug        Assessment: 1. SOB/hypoxemia - feeling better, PNA and/or edema 2. ESRD TTS HD 3. MBD on vit D, phoslo 4. COPD 5. DM2 diet  Plan - HD today, get vol down and repeat CXR after HD    Kelly Splinter MD  pager [phone]    cell [phone]  01/15/2015, 9:36 AM     Recent Labs Lab 01/15/15 0727  NA 134*  K 4.2  CL 90*  CO2 29  GLUCOSE 96  BUN 37*  CREATININE 8.72*  CALCIUM 8.3*   No results for input(s): AST, ALT, ALKPHOS, BILITOT, PROT, ALBUMIN in the last 168 hours.  Recent Labs Lab 01/15/15 0727  WBC 12.3*  HGB 8.2*  HCT 26.1*  MCV 86.1  PLT 269   . antiseptic oral rinse  7 mL Mouth Rinse BID  . aspirin EC  81 mg Oral Daily  . calcitRIOL  1.75 mcg Oral Q T,Th,Sa-HD  . [START ON 01/17/2015] ferric gluconate (FERRLECIT/NULECIT) IV  125 mg Intravenous Q Thu-HD  . [START ON 01/16/2015] levofloxacin (LEVAQUIN) IV  500 mg Intravenous Q48H  . sodium chloride  3 mL Intravenous Q12H     sodium chloride, sodium chloride

## 2015-01-15 NOTE — Progress Notes (Signed)
Unable to ambulate,patient came from dialysis treatment,non-compliant to direction due to lethargy.

## 2015-01-16 ENCOUNTER — Observation Stay (HOSPITAL_COMMUNITY): Payer: Medicare Other

## 2015-01-16 DIAGNOSIS — Z85028 Personal history of other malignant neoplasm of stomach: Secondary | ICD-10-CM | POA: Diagnosis not present

## 2015-01-16 DIAGNOSIS — R0602 Shortness of breath: Secondary | ICD-10-CM | POA: Diagnosis not present

## 2015-01-16 DIAGNOSIS — Z8701 Personal history of pneumonia (recurrent): Secondary | ICD-10-CM | POA: Diagnosis not present

## 2015-01-16 DIAGNOSIS — Z90710 Acquired absence of both cervix and uterus: Secondary | ICD-10-CM | POA: Diagnosis not present

## 2015-01-16 DIAGNOSIS — N186 End stage renal disease: Secondary | ICD-10-CM | POA: Diagnosis not present

## 2015-01-16 DIAGNOSIS — I10 Essential (primary) hypertension: Secondary | ICD-10-CM | POA: Diagnosis not present

## 2015-01-16 DIAGNOSIS — E213 Hyperparathyroidism, unspecified: Secondary | ICD-10-CM | POA: Diagnosis present

## 2015-01-16 DIAGNOSIS — E8770 Fluid overload, unspecified: Secondary | ICD-10-CM | POA: Diagnosis present

## 2015-01-16 DIAGNOSIS — R51 Headache: Secondary | ICD-10-CM | POA: Diagnosis present

## 2015-01-16 DIAGNOSIS — M1990 Unspecified osteoarthritis, unspecified site: Secondary | ICD-10-CM | POA: Diagnosis present

## 2015-01-16 DIAGNOSIS — I120 Hypertensive chronic kidney disease with stage 5 chronic kidney disease or end stage renal disease: Secondary | ICD-10-CM | POA: Diagnosis present

## 2015-01-16 DIAGNOSIS — K219 Gastro-esophageal reflux disease without esophagitis: Secondary | ICD-10-CM | POA: Diagnosis present

## 2015-01-16 DIAGNOSIS — J449 Chronic obstructive pulmonary disease, unspecified: Secondary | ICD-10-CM | POA: Diagnosis present

## 2015-01-16 DIAGNOSIS — E119 Type 2 diabetes mellitus without complications: Secondary | ICD-10-CM | POA: Diagnosis present

## 2015-01-16 DIAGNOSIS — Z7982 Long term (current) use of aspirin: Secondary | ICD-10-CM | POA: Diagnosis not present

## 2015-01-16 DIAGNOSIS — R0902 Hypoxemia: Secondary | ICD-10-CM | POA: Diagnosis present

## 2015-01-16 DIAGNOSIS — Z87442 Personal history of urinary calculi: Secondary | ICD-10-CM | POA: Diagnosis not present

## 2015-01-16 DIAGNOSIS — I2510 Atherosclerotic heart disease of native coronary artery without angina pectoris: Secondary | ICD-10-CM | POA: Diagnosis present

## 2015-01-16 DIAGNOSIS — I509 Heart failure, unspecified: Secondary | ICD-10-CM | POA: Diagnosis present

## 2015-01-16 DIAGNOSIS — J189 Pneumonia, unspecified organism: Secondary | ICD-10-CM | POA: Diagnosis present

## 2015-01-16 DIAGNOSIS — K5900 Constipation, unspecified: Secondary | ICD-10-CM | POA: Diagnosis present

## 2015-01-16 DIAGNOSIS — F329 Major depressive disorder, single episode, unspecified: Secondary | ICD-10-CM | POA: Diagnosis present

## 2015-01-16 DIAGNOSIS — D631 Anemia in chronic kidney disease: Secondary | ICD-10-CM | POA: Diagnosis present

## 2015-01-16 DIAGNOSIS — Z992 Dependence on renal dialysis: Secondary | ICD-10-CM | POA: Diagnosis not present

## 2015-01-16 DIAGNOSIS — Z66 Do not resuscitate: Secondary | ICD-10-CM | POA: Diagnosis present

## 2015-01-16 DIAGNOSIS — E785 Hyperlipidemia, unspecified: Secondary | ICD-10-CM | POA: Diagnosis present

## 2015-01-16 DIAGNOSIS — J45909 Unspecified asthma, uncomplicated: Secondary | ICD-10-CM | POA: Diagnosis present

## 2015-01-16 DIAGNOSIS — G629 Polyneuropathy, unspecified: Secondary | ICD-10-CM | POA: Diagnosis present

## 2015-01-16 LAB — CBC
HEMATOCRIT: 32.2 % — AB (ref 36.0–46.0)
HEMOGLOBIN: 10.1 g/dL — AB (ref 12.0–15.0)
MCH: 27.2 pg (ref 26.0–34.0)
MCHC: 31.4 g/dL (ref 30.0–36.0)
MCV: 86.6 fL (ref 78.0–100.0)
Platelets: 278 10*3/uL (ref 150–400)
RBC: 3.72 MIL/uL — ABNORMAL LOW (ref 3.87–5.11)
RDW: 18.6 % — ABNORMAL HIGH (ref 11.5–15.5)
WBC: 9.1 10*3/uL (ref 4.0–10.5)

## 2015-01-16 LAB — RENAL FUNCTION PANEL
Albumin: 2.4 g/dL — ABNORMAL LOW (ref 3.5–5.0)
Anion gap: 13 (ref 5–15)
BUN: 24 mg/dL — AB (ref 6–20)
CALCIUM: 9.1 mg/dL (ref 8.9–10.3)
CO2: 29 mmol/L (ref 22–32)
CREATININE: 6.67 mg/dL — AB (ref 0.44–1.00)
Chloride: 96 mmol/L — ABNORMAL LOW (ref 101–111)
GFR calc Af Amer: 6 mL/min — ABNORMAL LOW (ref >60)
GFR, EST NON AFRICAN AMERICAN: 5 mL/min — AB (ref >60)
Glucose, Bld: 105 mg/dL — ABNORMAL HIGH (ref 65–99)
Phosphorus: 5.4 mg/dL — ABNORMAL HIGH (ref 2.5–4.6)
Potassium: 3.9 mmol/L (ref 3.5–5.1)
Sodium: 138 mmol/L (ref 135–145)

## 2015-01-16 LAB — GLUCOSE, CAPILLARY
Glucose-Capillary: 92 mg/dL (ref 65–99)
Glucose-Capillary: 194 mg/dL — ABNORMAL HIGH (ref 65–99)

## 2015-01-16 MED ORDER — HEPARIN SODIUM (PORCINE) 1000 UNIT/ML DIALYSIS: 40.0000 [IU]/kg | INTRAMUSCULAR | Status: DC | PRN

## 2015-01-16 MED ORDER — LIDOCAINE HCL (PF) 1 % IJ SOLN: 5.0000 mL | INTRAMUSCULAR | Status: DC | PRN

## 2015-01-16 MED ORDER — ALTEPLASE 2 MG IJ SOLR
2.0000 mg | Freq: Once | INTRAMUSCULAR | Status: DC | PRN
  Filled 2015-01-16: qty 2

## 2015-01-16 MED ORDER — PENTAFLUOROPROP-TETRAFLUOROETH EX AERO: 1.0000 "application " | INHALATION_SPRAY | CUTANEOUS | Status: DC | PRN

## 2015-01-16 MED ORDER — SODIUM CHLORIDE 0.9 % IV SOLN: 100.0000 mL | INTRAVENOUS | Status: DC | PRN

## 2015-01-16 MED ORDER — NEPRO/CARBSTEADY PO LIQD: 237.0000 mL | ORAL | Status: DC | PRN

## 2015-01-16 MED ORDER — AZITHROMYCIN 250 MG PO TABS: 250.0000 mg | ORAL_TABLET | Freq: Every day | ORAL | Status: DC

## 2015-01-16 MED ORDER — HEPARIN SODIUM (PORCINE) 1000 UNIT/ML DIALYSIS
5000.0000 [IU] | Freq: Once | INTRAMUSCULAR | Status: AC
  Administered 2015-01-16: 5000 [IU] via INTRAVENOUS_CENTRAL

## 2015-01-16 MED ORDER — HEPARIN SODIUM (PORCINE) 1000 UNIT/ML DIALYSIS: 1000.0000 [IU] | INTRAMUSCULAR | Status: DC | PRN

## 2015-01-16 MED ORDER — LIDOCAINE-PRILOCAINE 2.5-2.5 % EX CREA: 1.0000 "application " | TOPICAL_CREAM | CUTANEOUS | Status: DC | PRN

## 2015-01-16 NOTE — Discharge Summary (Signed)
Physician Discharge Summary  Annette Roach CBU:[id number] DOB: 1928/03/20 DOA: 01/14/2015  PCP: Gilford Rile, MD  Admit date: 01/14/2015 Discharge date: 01/16/2015  Time spent: >35 minutes  Recommendations for Outpatient Follow-up:  F/u with HD as scheduled  F/u with PCP in 3-7 days as needed F/u with Pulmonology in 2-4 weeks   Discharge Diagnoses:  Principal Problem:   SOB (shortness of breath) Active Problems:   End stage renal disease on dialysis   Hypertension   CHF (congestive heart failure)   Anemia in chronic kidney disease   Hypoxia   Discharge Condition: stable   Diet recommendation: renal   Filed Weights   01/15/15 2111 01/16/15 0710 01/16/15 1030  Weight: 68 kg (149 lb 14.6 oz) 68 kg (149 lb 14.6 oz) 66 kg (145 lb 8.1 oz)    History of present illness:  81 y/o female with PMH of HTN, ESRD on HD, CHF, ?COPD, recent sepsis/pneumonias in march, April (2016) presented from [facility] with fever 100.2, cough, SOB. CXR: edema, no clear infiltrates. leukocytosis 12.9k on admission   Hospital Course:  1. SOB likely due to fluid overload. admission CXR: "+" edema.  -SOB improved, edema -resolved after hemodialysis. Repeat CXR: no clear infiltrates, no edema.   2. ? Underlying COPD. But her daughter denies h/o COPD. Pt reports mild productive cough. Patient received IV levofloxacin --> caused mild confusion.  Remains afebrile. No leukocytosis. No wheezing. D/w patient, her daughter recommended pulmonology f/u PFTs as outpatient  2. ESRD. Fluid mild fluid overload on admission. Pulmonary edema-resolved after HD> cont scheduled HD   D/c today. Discussed with patient, her daughter     Procedures:  HD (i.e. Studies not automatically included, echos, thoracentesis, etc; not x-rays)  Consultations:  Nephrology   Discharge Exam: Filed Vitals:   01/16/15 1145  BP: 115/36  Pulse: 72  Temp: 98 F (36.7 C)  Resp: 18    General: alert, no distress   Cardiovascular: s1,s2 rrr Respiratory: CTA BL  Discharge Instructions  Discharge Instructions    Diet - low sodium heart healthy    Complete by:  As directed      Discharge instructions    Complete by:  As directed   Please follow up with regular dialysis  Please follow up with primary care in 1 week as needed     Increase activity slowly    Complete by:  As directed             Medication List    STOP taking these medications        amoxicillin-clavulanate 500-125 MG per tablet  Commonly known as:  AUGMENTIN     docusate sodium 50 MG capsule  Commonly known as:  COLACE     gabapentin 300 MG capsule  Commonly known as:  NEURONTIN     HYDROcodone-acetaminophen 10-325 MG per tablet  Commonly known as:  NORCO     traMADol 50 MG tablet  Commonly known as:  ULTRAM      TAKE these medications        acetaminophen 500 MG tablet  Commonly known as:  TYLENOL  Take 500 mg by mouth 2 (two) times daily as needed for moderate pain. For pain     VENTOLIN HFA 108 (90 BASE) MCG/ACT inhaler  Generic drug:  albuterol  Inhale into the lungs.     albuterol (2.5 MG/3ML) 0.083% nebulizer solution  Commonly known as:  PROVENTIL  Take 2.5 mg by nebulization every 6 (six) hours as  needed for wheezing or shortness of breath.     amLODipine 2.5 MG tablet  Commonly known as:  NORVASC  Take 2.5 mg by mouth daily.     aspirin EC 81 MG tablet  Take 81 mg by mouth at bedtime.     Azelastine HCl 0.15 % Soln  Place 1 spray into both nostrils daily.     azithromycin 250 MG tablet  Commonly known as:  ZITHROMAX  Take 1 tablet (250 mg total) by mouth daily.     beclomethasone 80 MCG/ACT inhaler  Commonly known as:  QVAR  Inhale 2 puffs into the lungs 2 (two) times daily.     calcitRIOL 0.25 MCG capsule  Commonly known as:  ROCALTROL  Take 7 capsules (1.75 mcg total) by mouth every dialysis (secondary hyperparathyroidism).     calcium acetate 667 MG capsule  Commonly known as:   PHOSLO  Take 667 mg by mouth 2 (two) times daily before a meal.     ethyl chloride spray  Apply 1 application topically daily.     feeding supplement (NEPRO CARB STEADY) Liqd  Take 237 mLs by mouth as needed (missed meal during dialysis.).     hydrALAZINE 10 MG tablet  Commonly known as:  APRESOLINE  Take 10 mg by mouth 2 (two) times daily.     ipratropium-albuterol 0.5-2.5 (3) MG/3ML Soln  Commonly known as:  DUONEB  3 mLs every 4 (four) hours as needed (wheezing or shortness of breath).     isosorbide mononitrate 60 MG 24 hr tablet  Commonly known as:  IMDUR  Take 60 mg by mouth.     multivitamin Tabs tablet  Take 1 tablet by mouth daily.     nitroGLYCERIN 0.4 MG SL tablet  Commonly known as:  NITROSTAT  Place 0.4 mg under the tongue every 5 (five) minutes as needed for chest pain.     pantoprazole 40 MG tablet  Commonly known as:  PROTONIX  Take 40 mg by mouth daily.     polyethylene glycol packet  Commonly known as:  MIRALAX / GLYCOLAX  Take 17 g by mouth daily.     simvastatin 5 MG tablet  Commonly known as:  ZOCOR  Take 5 mg by mouth at bedtime.     STOOL SOFTENER PO  Take 1 tablet by mouth as needed (for constipation).       No Known Allergies     Follow-up Information    Follow up with Gilford Rile, MD In 1 week.   Specialty:  Internal Medicine   Contact information:   [address] [place] [address] [phone]        The results of significant diagnostics from this hospitalization (including imaging, microbiology, ancillary and laboratory) are listed below for reference.    Significant Diagnostic Studies: Dg Chest 1 View  01/16/2015   CLINICAL DATA:  Followup pulmonary edema.  EXAM: CHEST  1 VIEW  COMPARISON:  01/15/2015  FINDINGS: Stable cardiomegaly. There is mild vascular congestion but no overt pulmonary edema. No areas of lung consolidation is seen. Small pulmonary nodules evident on the right, stable. No pneumothorax. No obvious  pleural effusion.  IMPRESSION: 1. No convincing acute cardiopulmonary disease. No evidence of pulmonary edema and no evidence of pneumonia.   Electronically Signed   By: Lajean Manes M.D.   On: 01/16/2015 13:07   Dg Chest 2 View  01/15/2015   CLINICAL DATA:  Shortness of breath for 2 days  EXAM: CHEST  2 VIEW  COMPARISON:  01/14/2015  FINDINGS: Cardiac shadow is enlarged. Vascular congestion is again noted and stable. No focal infiltrate or sizable effusion is seen. No bony abnormality is noted.  IMPRESSION: Persistent vascular congestion.   Electronically Signed   By: Inez Catalina M.D.   On: 01/15/2015 14:53   Ct Head Wo Contrast  12/25/2014   CLINICAL DATA:  Right-sided weakness. Symptoms began last night. Difficulty speaking.  EXAM: CT HEAD WITHOUT CONTRAST  TECHNIQUE: Contiguous axial images were obtained from the base of the skull through the vertex without intravenous contrast.  COMPARISON:  11/17/2014  FINDINGS: There is atrophy and chronic small vessel disease changes. No acute intracranial abnormality. Specifically, no hemorrhage, hydrocephalus, mass lesion, acute infarction, or significant intracranial injury. No acute calvarial abnormality.  Extensive mucosal thickening throughout the paranasal sinuses. Mastoid air cells are clear. Orbital soft tissues unremarkable.  IMPRESSION: No acute intracranial abnormality.  Atrophy, chronic microvascular disease.  Chronic sinusitis.   Electronically Signed   By: Rolm Baptise M.D.   On: 12/25/2014 09:24   Ct Chest Wo Contrast  12/27/2014   CLINICAL DATA:  Pleural effusion.  Increasing shortness of breath.  EXAM: CT CHEST WITHOUT CONTRAST  TECHNIQUE: Multidetector CT imaging of the chest was performed following the standard protocol without IV contrast.  COMPARISON:  Chest x-ray dated 12/25/2014 and CT scan dated 10/26/2014  FINDINGS: There is a new moderate right pleural effusion with compressive atelectasis in the right lower lobe. Masses in both lower  lobes appear unchanged since the prior exam. Small left effusion has resolved. There is a 5 mm nodule and a 4 mm nodule in the left lung base on images 41 and 42 respectively. This area was obscured by the effusion and atelectasis on the prior exam.  There is a vague area of nodularity in the right upper lobe on image 21 which is unchanged since the prior exam.  No hilar or mediastinal adenopathy. Chronic cardiomegaly, essentially unchanged. No acute abnormality of the upper abdomen. No acute osseous abnormality. The bones are diffusely in homogeneously sclerotic. Anterior osteophytes fuse almost the entire thoracic spine.  IMPRESSION: 1. New moderate right pleural effusion with secondary compressive atelectasis in the right lower lobe. 2. Stable pulmonary nodules bilaterally since 10/26/2014. 3. Stable cardiomegaly with extensive calcification in the thoracic aorta and coronary arteries.   Electronically Signed   By: Lorriane Shire M.D.   On: 12/27/2014 16:47   Mr Brain Wo Contrast  12/25/2014   CLINICAL DATA:  81 year old female with slurred speech and right side weakness since this morning. Initial encounter. Current history of end-stage renal disease on dialysis.  EXAM: MRI HEAD WITHOUT CONTRAST  TECHNIQUE: Multiplanar, multiecho pulse sequences of the brain and surrounding structures were obtained without intravenous contrast.  COMPARISON:  Head CT without contrast 0916 hours today. Brain MRI 11/18/2014 and 11/25/2012.  FINDINGS: Major intracranial vascular flow voids are stable. Stable cerebral volume. No restricted diffusion to suggest acute infarction. No midline shift, mass effect, evidence of mass lesion, ventriculomegaly, extra-axial collection or acute intracranial hemorrhage. Cervicomedullary junction and pituitary are within normal limits.  Patchy and confluent bilateral cerebral Labra matter T2 and FLAIR hyperintensity, better depicted in 2014 and not significantly changed since that time. Similar  Patchy and confluent bilateral deep gray matter T2 hyperintensity, and pons hyperintensity. Cerebellum within normal limits. No cortical encephalomalacia or chronic blood products identified in the brain.  Stable visualized internal auditory structures. Mild mastoid effusion now on the right. Small  volume retained secretions in the pharynx.  Chronic sinusitis and polypoid material in the right nasal cavity, mildly progressed since 2014.  Stable orbits soft tissues. Visualized scalp soft tissues are within normal limits.  Increasingly heterogeneous bone marrow signal diffusely including in the upper cervical spine.  IMPRESSION: 1. No acute intracranial abnormality. Stable Patchy and confluent signal abnormality in the brain since 2014, favor small vessel disease related. 2. 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 3. Chronic but progressed paranasal sinusitis and right nasal cavity polyp(s).   Electronically Signed   By: Genevie Ann M.D.   On: 12/25/2014 11:52   [place] Chest  12/28/2014   CLINICAL DATA:  Patient was scheduled for ultrasound guided right thoracentesis.  EXAM: LIMITED CHEST ULTRASOUND FOR PLEURAL EFFUSIONS  TECHNIQUE: Limited ultrasound survey for pleural effusion was performed bilaterally.  COMPARISON:  CT Chest 12/27/14.  FINDINGS: No pleural fluid is seen on the left and trace amount of right pleural fluid seen that is not amendable to percutaneous drainage.  IMPRESSION: Trace amount of right pleural fluid seen that is not amendable to percutaneous drainage.  Read By:  Tsosie Billing PA-C   Electronically Signed   By: Marybelle Killings M.D.   On: 12/28/2014 11:06   Dg Chest Port 1 View  12/25/2014   CLINICAL DATA:  Dialysis complication, initial encounter.  EXAM: PORTABLE CHEST - 1 VIEW  COMPARISON:  One-view chest 11/17/2014  FINDINGS: The heart is enlarged. Atherosclerotic changes are noted at the aortic arch.  Asymmetric right-sided interstitial changes are noted. A right pleural effusion is suspected. Plate late atelectasis or scarring in the left midlung is stable. The visualized soft tissues and bony thorax are unremarkable.  IMPRESSION: 1. Cardiomegaly. 2. There is no asymmetric right-sided interstitial and airspace disease concerning for edema. 3. Probable right pleural effusion and atelectasis. Early infection is included in the differential. 4. Interval clearing of the left lung base. 5. Atherosclerosis.   Electronically Signed   By: San Morelle M.D.   On: 12/25/2014 10:53    Microbiology: Recent Results (from the past 240 hour(s))  MRSA PCR Screening     Status: None   Collection Time: 01/14/15 11:58 PM  Result Value Ref Range Status   MRSA by PCR NEGATIVE NEGATIVE Final    Comment:        The GeneXpert MRSA Assay (FDA approved for NASAL specimens only), is one component of a comprehensive MRSA colonization surveillance program. It is not intended to diagnose MRSA infection nor to guide or monitor treatment for MRSA infections.      Labs: Basic Metabolic Panel:  Recent Labs Lab 01/15/15 0727 01/16/15 0807  NA 134* 138  K 4.2 3.9  CL 90* 96*  CO2 29 29  GLUCOSE 96 105*  BUN 37* 24*  CREATININE 8.72* 6.67*  CALCIUM 8.3* 9.1  PHOS  --  5.4*   Liver Function Tests:  Recent Labs Lab 01/16/15 0807  ALBUMIN 2.4*   No results for input(s): LIPASE, AMYLASE in the last 168 hours. No results for input(s): AMMONIA in the last 168 hours. CBC:  Recent Labs Lab 01/15/15 0727 01/16/15 0807  WBC 12.3* 9.1  HGB 8.2* 10.1*  HCT 26.1* 32.2*  MCV 86.1 86.6  PLT 269 278   Cardiac Enzymes: No results for input(s): CKTOTAL, CKMB, CKMBINDEX, TROPONINI in the last 168 hours. BNP: BNP (last 3 results) No results for input(s): BNP in the last  8760 hours.  ProBNP (last 3 results) No results for input(s): PROBNP in the last 8760 hours.  CBG:  Recent Labs Lab  01/14/15 2204 01/15/15 1145 01/15/15 1726 01/15/15 2054 01/16/15 1137  GLUCAP 204* 92 81 194* 92       Signed:  Billi Bright N  [facility] 01/16/2015, 1:52 PM

## 2015-01-16 NOTE — Progress Notes (Signed)
IV team consulted to insert a new IV. Upon arrival to pt's room, IV nurse states that patient became frightened and lashed out when the RN woke patient up and attempted to remove old IV and insert a new one. This RN was notified and went to speak with the patient. Patient stated "there was a strange woman in here and she pulled out my IV". Patient was reassured that the "strange woman" was an IV nurse and she was there to start a new IV on her.   Shelbie Hutching, RN, BSN

## 2015-01-16 NOTE — Progress Notes (Signed)
Patient's daughter came as per patient's request.Patient was not in the room,having dialysis treatment.Nurse explained what happened last night,since patient is going to work and cannot wait for the unit's leadership,she left.Nurse gave contact numbers and call us here if,she could not come  back during daytime.

## 2015-01-16 NOTE — Progress Notes (Signed)
  [facility] Progress Note   Subjective: feeling better, no c/o's. 2.2kg off yest w no BP drop  Filed Vitals:   01/16/15 0710 01/16/15 0730 01/16/15 0735 01/16/15 0800  BP: 124/52 114/51 130/54 131/64  Pulse: 87 87 82 85  Temp: 98.2 F (36.8 C)     TempSrc: Oral     Resp: 18     Height:      Weight: 68 kg (149 lb 14.6 oz)     SpO2: 100%      Exam: Alert, no distress, calm No jvd Chest clear bilat RRR no MRG ABd soft, no ascites No LE edema Neuro is alert, Ox 3  HD: TTS [facility] 3.5h  70kg   F160   2/2 bath  P2   Heparin 5000   AVG Venofer 50/wk   Mircera 150 q 2wk last 5/10  Calcitriol 1.75ug        Assessment: 1. SOB/hypoxemia - feeling better w HD / vol removal 2. ESRD TTS HD 3. MBD on vit D, phoslo 4. COPD 5. DM2 diet  Plan - HD today again then repeat cxr and lower dry wt. Could be ready for dc after HD today.     Kelly Splinter MD  pager [phone]    cell [phone]  01/16/2015, 10:15 AM     Recent Labs Lab 01/15/15 0727 01/16/15 0807  NA 134* 138  K 4.2 3.9  CL 90* 96*  CO2 29 29  GLUCOSE 96 105*  BUN 37* 24*  CREATININE 8.72* 6.67*  CALCIUM 8.3* 9.1  PHOS  --  5.4*    Recent Labs Lab 01/16/15 0807  ALBUMIN 2.4*    Recent Labs Lab 01/15/15 0727 01/16/15 0807  WBC 12.3* 9.1  HGB 8.2* 10.1*  HCT 26.1* 32.2*  MCV 86.1 86.6  PLT 269 278   . antiseptic oral rinse  7 mL Mouth Rinse BID  . aspirin EC  81 mg Oral Daily  . calcitRIOL  1.75 mcg Oral Q T,Th,Sa-HD  . calcium acetate  667 mg Oral BID WC  . [START ON 01/17/2015] ferric gluconate (FERRLECIT/NULECIT) IV  125 mg Intravenous Q Thu-HD  . heparin  5,000 Units Dialysis Once in dialysis  . levofloxacin (LEVAQUIN) IV  500 mg Intravenous Q48H  . sodium chloride  3 mL Intravenous Q12H     sodium chloride, sodium chloride, sodium chloride, alteplase, feeding supplement (NEPRO CARB STEADY), heparin, heparin, lidocaine (PF), lidocaine-prilocaine, pentafluoroprop-tetrafluoroeth,  sodium chloride

## 2015-01-16 NOTE — Progress Notes (Signed)
Johnell Comings for discharged of her mother.

## 2015-01-17 NOTE — Care Management Note (Signed)
Case Management Note  Patient Details  Name: Annette Roach MRN: [id number] Date of Birth: 23-Mar-1928  Subjective/Objective:                    Action/Plan:  Pt d/c to home on 01/16/2015 . This CM notified by charge RN on 01/17/2015 that this pt had previously had [facility] services with [facility] and wished to resume services. Orders obtained from attending MD and faxed to [facility]. Spoke with that agency as well and services will continue.  Expected Discharge Date:                 01/16/15 Expected Discharge Plan:  [place]  In-House Referral:     Discharge planning Services  CM Consult  Post Acute Care Choice:    Choice offered to:     DME Arranged:    DME Agency:     HH Arranged:  RN, PT, Nurse's Aide [place] Agency:  [place] [facility]  Status of Service:  Completed, signed off  Medicare Important Message Given:  No Date Medicare IM Given:    Medicare IM give by:    Date Additional Medicare IM Given:    Additional Medicare Important Message give by:     If discussed at [place] of Stay Meetings, dates discussed:    Additional Comments:  Adron Bene, RN 01/17/2015, 4:09 PM

## 2015-02-23 ENCOUNTER — Inpatient Hospital Stay (HOSPITAL_COMMUNITY)
Admission: EM | Admit: 2015-02-23 | Discharge: 2015-02-25 | DRG: 175 | Disposition: A | Discharge status: Home / Self Care | Payer: Medicare Other | Attending: Internal Medicine | Admitting: Internal Medicine

## 2015-02-23 ENCOUNTER — Observation Stay (HOSPITAL_COMMUNITY): Payer: Medicare Other

## 2015-02-23 ENCOUNTER — Emergency Department (HOSPITAL_COMMUNITY): Payer: Medicare Other

## 2015-02-23 ENCOUNTER — Encounter (HOSPITAL_COMMUNITY): Payer: Self-pay | Admitting: Emergency Medicine

## 2015-02-23 DIAGNOSIS — I2510 Atherosclerotic heart disease of native coronary artery without angina pectoris: Secondary | ICD-10-CM | POA: Diagnosis present

## 2015-02-23 DIAGNOSIS — I4581 Long QT syndrome: Secondary | ICD-10-CM

## 2015-02-23 DIAGNOSIS — I132 Hypertensive heart and chronic kidney disease with heart failure and with stage 5 chronic kidney disease, or end stage renal disease: Secondary | ICD-10-CM

## 2015-02-23 DIAGNOSIS — I25119 Atherosclerotic heart disease of native coronary artery with unspecified angina pectoris: Secondary | ICD-10-CM

## 2015-02-23 DIAGNOSIS — I4510 Unspecified right bundle-branch block: Secondary | ICD-10-CM | POA: Diagnosis present

## 2015-02-23 DIAGNOSIS — R0602 Shortness of breath: Secondary | ICD-10-CM

## 2015-02-23 DIAGNOSIS — R059 Cough, unspecified: Secondary | ICD-10-CM | POA: Diagnosis present

## 2015-02-23 DIAGNOSIS — E1140 Type 2 diabetes mellitus with diabetic neuropathy, unspecified: Secondary | ICD-10-CM | POA: Diagnosis present

## 2015-02-23 DIAGNOSIS — R410 Disorientation, unspecified: Secondary | ICD-10-CM | POA: Diagnosis not present

## 2015-02-23 DIAGNOSIS — Z833 Family history of diabetes mellitus: Secondary | ICD-10-CM

## 2015-02-23 DIAGNOSIS — J9811 Atelectasis: Secondary | ICD-10-CM | POA: Diagnosis present

## 2015-02-23 DIAGNOSIS — R7989 Other specified abnormal findings of blood chemistry: Secondary | ICD-10-CM | POA: Diagnosis not present

## 2015-02-23 DIAGNOSIS — N2581 Secondary hyperparathyroidism of renal origin: Secondary | ICD-10-CM | POA: Diagnosis present

## 2015-02-23 DIAGNOSIS — N186 End stage renal disease: Secondary | ICD-10-CM | POA: Diagnosis not present

## 2015-02-23 DIAGNOSIS — I2699 Other pulmonary embolism without acute cor pulmonale: Secondary | ICD-10-CM | POA: Diagnosis not present

## 2015-02-23 DIAGNOSIS — Z992 Dependence on renal dialysis: Secondary | ICD-10-CM

## 2015-02-23 DIAGNOSIS — D481 Neoplasm of uncertain behavior of connective and other soft tissue: Secondary | ICD-10-CM

## 2015-02-23 DIAGNOSIS — Z79899 Other long term (current) drug therapy: Secondary | ICD-10-CM

## 2015-02-23 DIAGNOSIS — E785 Hyperlipidemia, unspecified: Secondary | ICD-10-CM | POA: Diagnosis present

## 2015-02-23 DIAGNOSIS — R079 Chest pain, unspecified: Secondary | ICD-10-CM | POA: Diagnosis not present

## 2015-02-23 DIAGNOSIS — Z9981 Dependence on supplemental oxygen: Secondary | ICD-10-CM

## 2015-02-23 DIAGNOSIS — Z809 Family history of malignant neoplasm, unspecified: Secondary | ICD-10-CM

## 2015-02-23 DIAGNOSIS — I4589 Other specified conduction disorders: Secondary | ICD-10-CM

## 2015-02-23 DIAGNOSIS — I5042 Chronic combined systolic (congestive) and diastolic (congestive) heart failure: Secondary | ICD-10-CM | POA: Diagnosis present

## 2015-02-23 DIAGNOSIS — E876 Hypokalemia: Secondary | ICD-10-CM | POA: Diagnosis present

## 2015-02-23 DIAGNOSIS — R0789 Other chest pain: Secondary | ICD-10-CM | POA: Diagnosis not present

## 2015-02-23 DIAGNOSIS — E1122 Type 2 diabetes mellitus with diabetic chronic kidney disease: Secondary | ICD-10-CM | POA: Diagnosis present

## 2015-02-23 DIAGNOSIS — Z8249 Family history of ischemic heart disease and other diseases of the circulatory system: Secondary | ICD-10-CM

## 2015-02-23 DIAGNOSIS — I10 Essential (primary) hypertension: Secondary | ICD-10-CM

## 2015-02-23 DIAGNOSIS — I272 Other secondary pulmonary hypertension: Secondary | ICD-10-CM | POA: Diagnosis present

## 2015-02-23 DIAGNOSIS — D631 Anemia in chronic kidney disease: Secondary | ICD-10-CM | POA: Diagnosis present

## 2015-02-23 DIAGNOSIS — C169 Malignant neoplasm of stomach, unspecified: Secondary | ICD-10-CM | POA: Diagnosis present

## 2015-02-23 DIAGNOSIS — Z7951 Long term (current) use of inhaled steroids: Secondary | ICD-10-CM

## 2015-02-23 DIAGNOSIS — I120 Hypertensive chronic kidney disease with stage 5 chronic kidney disease or end stage renal disease: Secondary | ICD-10-CM | POA: Diagnosis present

## 2015-02-23 DIAGNOSIS — D649 Anemia, unspecified: Secondary | ICD-10-CM

## 2015-02-23 DIAGNOSIS — R05 Cough: Secondary | ICD-10-CM | POA: Diagnosis present

## 2015-02-23 DIAGNOSIS — Z7982 Long term (current) use of aspirin: Secondary | ICD-10-CM

## 2015-02-23 DIAGNOSIS — J449 Chronic obstructive pulmonary disease, unspecified: Secondary | ICD-10-CM | POA: Diagnosis present

## 2015-02-23 DIAGNOSIS — Z66 Do not resuscitate: Secondary | ICD-10-CM | POA: Diagnosis not present

## 2015-02-23 DIAGNOSIS — R778 Other specified abnormalities of plasma proteins: Secondary | ICD-10-CM | POA: Diagnosis not present

## 2015-02-23 DIAGNOSIS — I340 Nonrheumatic mitral (valve) insufficiency: Secondary | ICD-10-CM | POA: Diagnosis present

## 2015-02-23 DIAGNOSIS — I25118 Atherosclerotic heart disease of native coronary artery with other forms of angina pectoris: Secondary | ICD-10-CM | POA: Diagnosis present

## 2015-02-23 DIAGNOSIS — K219 Gastro-esophageal reflux disease without esophagitis: Secondary | ICD-10-CM | POA: Diagnosis present

## 2015-02-23 DIAGNOSIS — I5030 Unspecified diastolic (congestive) heart failure: Secondary | ICD-10-CM

## 2015-02-23 LAB — COMPREHENSIVE METABOLIC PANEL
ALBUMIN: 3 g/dL — AB (ref 3.5–5.0)
ALK PHOS: 78 U/L (ref 38–126)
ALT: 8 U/L — AB (ref 14–54)
ANION GAP: 14 (ref 5–15)
AST: 19 U/L (ref 15–41)
BUN: 15 mg/dL (ref 6–20)
CALCIUM: 9 mg/dL (ref 8.9–10.3)
CHLORIDE: 96 mmol/L — AB (ref 101–111)
CO2: 28 mmol/L (ref 22–32)
CREATININE: 6.6 mg/dL — AB (ref 0.44–1.00)
GFR, EST AFRICAN AMERICAN: 6 mL/min — AB (ref >60)
GFR, EST NON AFRICAN AMERICAN: 5 mL/min — AB (ref >60)
GLUCOSE: 130 mg/dL — AB (ref 65–99)
Potassium: 3.3 mmol/L — ABNORMAL LOW (ref 3.5–5.1)
Sodium: 138 mmol/L (ref 135–145)
Total Bilirubin: 0.4 mg/dL (ref 0.3–1.2)
Total Protein: 7.8 g/dL (ref 6.5–8.1)

## 2015-02-23 LAB — VITAMIN B12: VITAMIN B 12: 1203 pg/mL — AB (ref 180–914)

## 2015-02-23 LAB — GLUCOSE, CAPILLARY
Glucose-Capillary: 177 mg/dL — ABNORMAL HIGH (ref 65–99)
Glucose-Capillary: 98 mg/dL (ref 65–99)
Glucose-Capillary: 123 mg/dL — ABNORMAL HIGH (ref 65–99)

## 2015-02-23 LAB — CBC WITH DIFFERENTIAL/PLATELET
BASOS ABS: 0 10*3/uL (ref 0.0–0.1)
BASOS PCT: 0 % (ref 0–1)
EOS ABS: 0.1 10*3/uL (ref 0.0–0.7)
EOS PCT: 3 % (ref 0–5)
HEMATOCRIT: 29.3 % — AB (ref 36.0–46.0)
Hemoglobin: 9.2 g/dL — ABNORMAL LOW (ref 12.0–15.0)
Lymphocytes Relative: 24 % (ref 12–46)
Lymphs Abs: 1.1 10*3/uL (ref 0.7–4.0)
MCH: 26.6 pg (ref 26.0–34.0)
MCHC: 31.4 g/dL (ref 30.0–36.0)
MCV: 84.7 fL (ref 78.0–100.0)
MONO ABS: 0.4 10*3/uL (ref 0.1–1.0)
Monocytes Relative: 8 % (ref 3–12)
Neutro Abs: 2.9 10*3/uL (ref 1.7–7.7)
Neutrophils Relative %: 65 % (ref 43–77)
Platelets: 219 10*3/uL (ref 150–400)
RBC: 3.46 MIL/uL — ABNORMAL LOW (ref 3.87–5.11)
RDW: 17.8 % — AB (ref 11.5–15.5)
WBC: 4.5 10*3/uL (ref 4.0–10.5)

## 2015-02-23 LAB — TROPONIN I
TROPONIN I: 0.12 ng/mL — AB (ref <0.031)
TROPONIN I: 0.1 ng/mL — AB (ref <0.031)

## 2015-02-23 LAB — I-STAT TROPONIN, ED: Troponin i, poc: 0.1 ng/mL (ref 0.00–0.08)

## 2015-02-23 LAB — PHOSPHORUS: Phosphorus: 4.9 mg/dL — ABNORMAL HIGH (ref 2.5–4.6)

## 2015-02-23 LAB — RETICULOCYTES
RBC.: 3.44 MIL/uL — AB (ref 3.87–5.11)
Retic Count, Absolute: 113.5 10*3/uL (ref 19.0–186.0)
Retic Ct Pct: 3.3 % — ABNORMAL HIGH (ref 0.4–3.1)

## 2015-02-23 LAB — D-DIMER, QUANTITATIVE (NOT AT ARMC): D DIMER QUANT: 3.33 ug{FEU}/mL — AB (ref 0.00–0.48)

## 2015-02-23 LAB — FOLATE: Folate: 38 ng/mL (ref >5.9)

## 2015-02-23 LAB — BRAIN NATRIURETIC PEPTIDE: B NATRIURETIC PEPTIDE 5: 1623.7 pg/mL — AB (ref 0.0–100.0)

## 2015-02-23 LAB — TECHNOLOGIST SMEAR REVIEW

## 2015-02-23 LAB — MAGNESIUM: Magnesium: 2.1 mg/dL (ref 1.7–2.4)

## 2015-02-23 LAB — TSH: TSH: 3.431 u[IU]/mL (ref 0.350–4.500)

## 2015-02-23 MED ORDER — HYDRALAZINE HCL 20 MG/ML IJ SOLN
10.0000 mg | Freq: Once | INTRAMUSCULAR | Status: AC
  Administered 2015-02-23: 10 mg via INTRAVENOUS
  Filled 2015-02-23: qty 1

## 2015-02-23 MED ORDER — IOHEXOL 350 MG/ML SOLN
80.0000 mL | Freq: Once | INTRAVENOUS | Status: AC | PRN
  Administered 2015-02-23: 80 mL via INTRAVENOUS

## 2015-02-23 MED ORDER — LORATADINE 10 MG PO TABS
10.0000 mg | ORAL_TABLET | Freq: Every day | ORAL | Status: DC
  Administered 2015-02-23 – 2015-02-25 (×3): 10 mg via ORAL
  Filled 2015-02-23 (×3): qty 1

## 2015-02-23 MED ORDER — AZELASTINE HCL 0.1 % NA SOLN
1.0000 | Freq: Every day | NASAL | Status: DC
  Administered 2015-02-24 – 2015-02-25 (×2): 1 via NASAL
  Filled 2015-02-23: qty 30

## 2015-02-23 MED ORDER — CALCITRIOL 0.5 MCG PO CAPS: 1.7500 ug | ORAL_CAPSULE | ORAL | Status: DC

## 2015-02-23 MED ORDER — WARFARIN VIDEO
Freq: Once | Status: AC
  Administered 2015-02-23: 18:00:00

## 2015-02-23 MED ORDER — ENOXAPARIN SODIUM 30 MG/0.3ML ~~LOC~~ SOLN
30.0000 mg | SUBCUTANEOUS | Status: DC
  Administered 2015-02-23: 30 mg via SUBCUTANEOUS
  Filled 2015-02-23: qty 0.3

## 2015-02-23 MED ORDER — BUDESONIDE 0.25 MG/2ML IN SUSP
2.0000 mL | Freq: Two times a day (BID) | RESPIRATORY_TRACT | Status: DC
  Administered 2015-02-23 – 2015-02-25 (×4): 0.25 mg via RESPIRATORY_TRACT
  Filled 2015-02-23 (×4): qty 2

## 2015-02-23 MED ORDER — ALTEPLASE 2 MG IJ SOLR
2.0000 mg | Freq: Once | INTRAMUSCULAR | Status: AC | PRN
  Filled 2015-02-23: qty 2

## 2015-02-23 MED ORDER — ASPIRIN 325 MG PO TABS
325.0000 mg | ORAL_TABLET | Freq: Once | ORAL | Status: DC
  Filled 2015-02-23: qty 1

## 2015-02-23 MED ORDER — LIDOCAINE HCL (PF) 1 % IJ SOLN: 5.0000 mL | INTRAMUSCULAR | Status: DC | PRN

## 2015-02-23 MED ORDER — DARBEPOETIN ALFA 150 MCG/0.3ML IJ SOSY
150.0000 ug | PREFILLED_SYRINGE | INTRAMUSCULAR | Status: DC
  Filled 2015-02-23: qty 0.3

## 2015-02-23 MED ORDER — PROCHLORPERAZINE MALEATE 10 MG PO TABS: 10.0000 mg | ORAL_TABLET | Freq: Four times a day (QID) | ORAL | Status: DC | PRN

## 2015-02-23 MED ORDER — POLYETHYLENE GLYCOL 3350 17 G PO PACK: 17.0000 g | PACK | Freq: Every day | ORAL | Status: DC | PRN

## 2015-02-23 MED ORDER — TRAMADOL HCL 50 MG PO TABS
50.0000 mg | ORAL_TABLET | Freq: Four times a day (QID) | ORAL | Status: DC | PRN
  Administered 2015-02-23 – 2015-02-24 (×2): 50 mg via ORAL
  Filled 2015-02-23 (×2): qty 1

## 2015-02-23 MED ORDER — HEPARIN SODIUM (PORCINE) 1000 UNIT/ML DIALYSIS: 1000.0000 [IU] | INTRAMUSCULAR | Status: DC | PRN

## 2015-02-23 MED ORDER — INSULIN ASPART 100 UNIT/ML ~~LOC~~ SOLN
0.0000 [IU] | Freq: Three times a day (TID) | SUBCUTANEOUS | Status: DC
  Administered 2015-02-24 (×3): 1 [IU] via SUBCUTANEOUS

## 2015-02-23 MED ORDER — PENTAFLUOROPROP-TETRAFLUOROETH EX AERO: 1.0000 "application " | INHALATION_SPRAY | CUTANEOUS | Status: DC | PRN

## 2015-02-23 MED ORDER — SODIUM CHLORIDE 0.9 % IJ SOLN
3.0000 mL | Freq: Two times a day (BID) | INTRAMUSCULAR | Status: DC
  Administered 2015-02-23 – 2015-02-25 (×3): 3 mL via INTRAVENOUS

## 2015-02-23 MED ORDER — NITROGLYCERIN 0.4 MG SL SUBL
0.4000 mg | SUBLINGUAL_TABLET | SUBLINGUAL | Status: AC | PRN
  Administered 2015-02-23 (×3): 0.4 mg via SUBLINGUAL
  Filled 2015-02-23 (×3): qty 1

## 2015-02-23 MED ORDER — PANTOPRAZOLE SODIUM 40 MG PO TBEC
40.0000 mg | DELAYED_RELEASE_TABLET | Freq: Every day | ORAL | Status: DC
  Administered 2015-02-23 – 2015-02-25 (×3): 40 mg via ORAL
  Filled 2015-02-23 (×3): qty 1

## 2015-02-23 MED ORDER — RENA-VITE PO TABS
1.0000 | ORAL_TABLET | Freq: Every day | ORAL | Status: DC
  Administered 2015-02-24 (×2): 1 via ORAL
  Filled 2015-02-23 (×2): qty 1

## 2015-02-23 MED ORDER — DOCUSATE SODIUM 100 MG PO CAPS: 100.0000 mg | ORAL_CAPSULE | Freq: Two times a day (BID) | ORAL | Status: DC | PRN

## 2015-02-23 MED ORDER — SODIUM CHLORIDE 0.9 % IV SOLN: 100.0000 mL | INTRAVENOUS | Status: DC | PRN

## 2015-02-23 MED ORDER — LIDOCAINE-PRILOCAINE 2.5-2.5 % EX CREA
1.0000 "application " | TOPICAL_CREAM | CUTANEOUS | Status: DC | PRN
  Filled 2015-02-23: qty 5

## 2015-02-23 MED ORDER — ALBUTEROL SULFATE (2.5 MG/3ML) 0.083% IN NEBU: 2.5000 mg | INHALATION_SOLUTION | Freq: Four times a day (QID) | RESPIRATORY_TRACT | Status: DC | PRN

## 2015-02-23 MED ORDER — IMATINIB MESYLATE 100 MG PO TABS
100.0000 mg | ORAL_TABLET | ORAL | Status: DC
  Administered 2015-02-24 (×2): 100 mg via ORAL
  Filled 2015-02-23 (×4): qty 1

## 2015-02-23 MED ORDER — HEPARIN (PORCINE) IN NACL 100-0.45 UNIT/ML-% IJ SOLN
1100.0000 [IU]/h | INTRAMUSCULAR | Status: DC
  Administered 2015-02-23: 1100 [IU]/h via INTRAVENOUS
  Filled 2015-02-23: qty 250

## 2015-02-23 MED ORDER — ACETAMINOPHEN 500 MG PO TABS: 500.0000 mg | ORAL_TABLET | Freq: Two times a day (BID) | ORAL | Status: DC | PRN

## 2015-02-23 MED ORDER — SODIUM CHLORIDE 0.9 % IV SOLN: 125.0000 mg | Freq: Once | INTRAVENOUS | Status: DC

## 2015-02-23 MED ORDER — ASPIRIN EC 81 MG PO TBEC
81.0000 mg | DELAYED_RELEASE_TABLET | Freq: Every day | ORAL | Status: DC
  Administered 2015-02-24 – 2015-02-25 (×2): 81 mg via ORAL
  Filled 2015-02-23 (×2): qty 1

## 2015-02-23 MED ORDER — COUMADIN BOOK
Freq: Once | Status: AC
  Administered 2015-02-23: 18:00:00
  Filled 2015-02-23: qty 1

## 2015-02-23 MED ORDER — WARFARIN SODIUM 7.5 MG PO TABS: 7.5000 mg | ORAL_TABLET | Freq: Once | ORAL | Status: DC

## 2015-02-23 MED ORDER — HYDRALAZINE HCL 10 MG PO TABS
10.0000 mg | ORAL_TABLET | Freq: Two times a day (BID) | ORAL | Status: DC
  Administered 2015-02-24 – 2015-02-25 (×3): 10 mg via ORAL
  Filled 2015-02-23 (×4): qty 1

## 2015-02-23 MED ORDER — ASPIRIN EC 81 MG PO TBEC: 81.0000 mg | DELAYED_RELEASE_TABLET | Freq: Every day | ORAL | Status: DC

## 2015-02-23 MED ORDER — WARFARIN - PHARMACIST DOSING INPATIENT: Freq: Every day | Status: DC

## 2015-02-23 MED ORDER — NEPRO/CARBSTEADY PO LIQD
237.0000 mL | Freq: Two times a day (BID) | ORAL | Status: DC
  Administered 2015-02-24 – 2015-02-25 (×3): 237 mL via ORAL
  Filled 2015-02-23 (×7): qty 237

## 2015-02-23 MED ORDER — SODIUM CHLORIDE 0.9 % IV SOLN
125.0000 mg | INTRAVENOUS | Status: DC
  Administered 2015-02-24: 125 mg via INTRAVENOUS
  Filled 2015-02-23 (×2): qty 10

## 2015-02-23 MED ORDER — NEPRO/CARBSTEADY PO LIQD
237.0000 mL | ORAL | Status: DC | PRN
  Filled 2015-02-23: qty 237

## 2015-02-23 MED ORDER — CALCITRIOL 0.5 MCG PO CAPS: 2.0000 ug | ORAL_CAPSULE | ORAL | Status: DC

## 2015-02-23 MED ORDER — AZELASTINE HCL 0.15 % NA SOLN
1.0000 | Freq: Every day | NASAL | Status: DC
  Filled 2015-02-23: qty 30

## 2015-02-23 MED ORDER — BENZONATATE 100 MG PO CAPS
100.0000 mg | ORAL_CAPSULE | Freq: Two times a day (BID) | ORAL | Status: DC
  Administered 2015-02-23 – 2015-02-25 (×5): 100 mg via ORAL
  Filled 2015-02-23 (×5): qty 1

## 2015-02-23 MED ORDER — CALCIUM ACETATE 667 MG PO CAPS
667.0000 mg | ORAL_CAPSULE | Freq: Two times a day (BID) | ORAL | Status: DC
  Administered 2015-02-24 – 2015-02-25 (×4): 667 mg via ORAL
  Filled 2015-02-23 (×6): qty 1

## 2015-02-23 MED ORDER — HEPARIN SODIUM (PORCINE) 1000 UNIT/ML DIALYSIS: 20.0000 [IU]/kg | INTRAMUSCULAR | Status: DC | PRN

## 2015-02-23 NOTE — ED Notes (Signed)
BP 859 systolic. MD aware. Pt does not have IV access at this time. MD Aware- ok to give nitro. Will recheck BP

## 2015-02-23 NOTE — ED Notes (Signed)
Gave pt crackers and coffee.

## 2015-02-23 NOTE — Consult Note (Signed)
[facility] Renal Consultation Note    Indication for Consultation:  Management of ESRD/hemodialysis; anemia, hypertension/volume and secondary hyperparathyroidism PCP: Dr. Gilford Rile  HPI: Annette Roach is a 81 y.o. 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 She had to take NTG this am due to the CP, which she usually does not have to do.   She has no appetite for a month nor much energy.  Some times she thinks she wants to eat and then when she gets the food, she can't.  She had her [place] declotted earlier this week at [facility] and it has worked well since then. Her HD treatments haven't gone well lately. She states her BP is up and down and she vomits during the treatments.  She had a large BM yesterday but no diarrhea. She does not make urine. She is free of CP at present and is due for hemodialysis today.  Past Medical History  Diagnosis Date  . COPD (chronic obstructive pulmonary disease)     emphysema  . Neuropathy   . Hyperlipidemia   . Diabetes mellitus   . Depression   . Anemia   . Shortness of breath   . GERD (gastroesophageal reflux disease)   . Hyperparathyroidism   . Hypertension     sees Dr. Gilford Rile  . Headache(784.0)   . Arthritis   . CAD (coronary artery disease)     sees Dr. Jenne Campus, [facility] cardiology [facility], [place]  . 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 Wears glasses   . Wears hearing aid     right  . Presence of surgically created AV shunt for hemodialysis     old lt upper arm out-rt upper arm shunt in  . Anginal pain     Dr Raliegh Ip in [place], [place]  . History of kidney stones   . Constipation   . History of blood transfusion   . Asthma     years ago  . Chronic kidney disease     TTH [facility] [place], [place]- Hemo   Past Surgical History  Procedure Laterality Date  .  Abdominal hysterectomy  1976  . Kidney stone surgery  2013  . Av fistula placement  01/20/2012    Procedure: ARTERIOVENOUS (AV) FISTULA CREATION;  Surgeon: Elam Dutch, MD;  Location: [facility];  Service: Vascular;  Laterality: Left;  . Hemodialysis catheter  05/25/12    secondary to failed AVF   . Eye surgery      bilateral cataract removed  . Av fistula placement  09/05/2012    Procedure: INSERTION OF ARTERIOVENOUS (AV) GORE-TEX GRAFT ARM;  Surgeon: Elam Dutch, MD;  Location: [facility];  Service: Vascular;  Laterality: Left;  Using 4-78mm x 45 cm Vascular stretch goretex graft  . Ligation of arteriovenous  fistula  09/05/2012    Procedure: LIGATION OF ARTERIOVENOUS  FISTULA;  Surgeon: Elam Dutch, MD;  Location: [facility];  Service: Vascular;  Laterality: Left;  . Ligation arteriovenous gortex graft  09/05/2012    Procedure: LIGATION ARTERIOVENOUS GORTEX GRAFT;  Surgeon: Elam Dutch, MD;  Location: [facility];  Service: Vascular;  Laterality: Left;  . Av fistula placement Right 11/07/2012    Procedure: ARTERIOVENOUS (AV) FISTULA CREATION;  Surgeon: Elam Dutch, MD;  Location: [facility];  Service: Vascular;  Laterality: Right;  Bascilic Vein Fistula  . Appendectomy    . Carpal tunnel release Left 02/10/2013    Procedure: CARPAL TUNNEL RELEASE;  Surgeon: Cammie Sickle., MD;  Location: [facility];  Service: Orthopedics;  Laterality: Left;  . Bascilic vein transposition Right 03/15/2013    Procedure: 2ND STAGE BASCILIC VEIN TRANSPOSITION - RIGHT ARM;  Surgeon: Elam Dutch, MD;  Location: [facility];  Service: Vascular;  Laterality: Right;  . Av fistula placement Right 05/15/2013    Procedure: INSERTION OF ARTERIOVENOUS (AV) GORE-TEX GRAFT ARM-RIGHT;  Surgeon: Elam Dutch, MD;  Location: [place];  Service: Vascular;  Laterality: Right;  . Shuntogram N/A 07/27/2012    Procedure: Earney Mallet;  Surgeon: Rosetta Posner, MD;  Location: [facility] CATH LAB;  Service: Cardiovascular;  Laterality:  N/A;   Family History  Problem Relation Age of Onset  . Diabetes Mother   . Cancer Father   . Deep vein thrombosis Brother   . Hypertension Brother    Social History:  reports that she has never smoked. She has never used smokeless tobacco. She reports that she does not drink alcohol or use illicit drugs. No Known Allergies Prior to Admission medications   Medication Sig Start Date End Date Taking? Authorizing Provider  albuterol (PROVENTIL) (2.5 MG/3ML) 0.083% nebulizer solution Take 2.5 mg by nebulization every 6 (six) hours as needed for wheezing or shortness of breath.   Yes Historical Provider, MD  aspirin EC 81 MG tablet Take 81 mg by mouth at bedtime.    Yes Historical Provider, MD  Azelastine HCl 0.15 % SOLN Place 1 spray into both nostrils daily.  10/10/14  Yes Historical Provider, MD  beclomethasone (QVAR) 80 MCG/ACT inhaler Inhale 2 puffs into the lungs 2 (two) times daily.   Yes Historical Provider, MD  calcium acetate (PHOSLO) 667 MG capsule Take 667 mg by mouth 2 (two) times daily before a meal.    Yes Historical Provider, MD  docusate sodium (COLACE) 100 MG capsule Take 100 mg by mouth 2 (two) times daily as needed for mild constipation.   Yes Historical Provider, MD  hydrALAZINE (APRESOLINE) 10 MG tablet Take 10 mg by mouth 2 (two) times daily.   Yes Historical Provider, MD  imatinib (GLEEVEC) 100 MG tablet Take 100 mg by mouth daily. Take at 3:30pm daily 02/04/15  Yes Historical Provider, MD  multivitamin (RENA-VIT) TABS tablet Take 1 tablet by mouth daily.   Yes Historical Provider, MD  nitroGLYCERIN (NITROSTAT) 0.4 MG SL tablet Place 0.4 mg under the tongue every 5 (five) minutes as needed for chest pain.   Yes Historical Provider, MD  pantoprazole (PROTONIX) 40 MG tablet Take 40 mg by mouth daily.   Yes Historical Provider, MD  polyethylene glycol (MIRALAX / GLYCOLAX) packet Take 17 g by mouth daily.   Yes Historical Provider, MD  Psyllium (DIETARY FIBER LAXATIVE PO) Take 1  capsule by mouth daily as needed (Constipation).   Yes Historical Provider, MD  traMADol (ULTRAM) 50 MG tablet Take 50 mg by mouth every 6 (six) hours as needed for moderate pain.   Yes Historical Provider, MD  VENTOLIN HFA 108 (90 BASE) MCG/ACT inhaler Inhale 2 puffs into the lungs every 6 (six) hours as needed for wheezing or shortness of breath.  10/12/14  Yes Historical Provider, MD  acetaminophen (TYLENOL) 500 MG tablet Take 500 mg by mouth 2 (two) times daily as needed for moderate pain. For pain    Historical Provider, MD  calcitRIOL (ROCALTROL) 0.25 MCG capsule Take 7 capsules (1.75 mcg total) by mouth every dialysis (secondary hyperparathyroidism). Patient not taking: Reported on 02/23/2015 12/29/14   Cherene Altes, MD  Nutritional Supplements (FEEDING SUPPLEMENT, NEPRO CARB STEADY,) LIQD Take 237 mLs by mouth as needed (missed meal during dialysis.). Patient not taking: Reported on 02/23/2015 11/20/14   Belkys A Regalado, MD  ondansetron (ZOFRAN) 4 MG tablet 1 TAB EVERY 4 HOURS AS NEEDED FOR CHEMO INDUCED NAUSEA (MAY TAKE AROUND CLOCK FOR PERSISTENT SYMPTO 01/30/15   Historical Provider, MD  prochlorperazine (COMPAZINE) 10 MG tablet TAKE 1 TABLET EVERY 6 HOURS AS NEEDED FOR CHEMO NAUSEA-MAY TAKE AROUND THE CLOCK FOR PERSISTENT SYMP 01/30/15   Historical Provider, MD   Current Facility-Administered Medications  Medication Dose Route Frequency Provider Last Rate Last Dose  . acetaminophen (TYLENOL) tablet 500 mg  500 mg Oral BID PRN Marjan Rabbani, MD      . albuterol (PROVENTIL) (2.5 MG/3ML) 0.083% nebulizer solution 2.5 mg  2.5 mg Nebulization Q6H PRN Juluis Mire, MD      . Derrill Memo ON 02/24/2015] aspirin EC tablet 81 mg  81 mg Oral Daily Marjan Rabbani, MD      . aspirin tablet 325 mg  325 mg Oral Once Marjan Rabbani, MD      . azelastine (ASTELIN) 0.1 % nasal spray 1 spray  1 spray Each Nare Daily Sid Falcon, MD      . benzonatate (TESSALON) capsule 100 mg  100 mg Oral BID Marjan  Rabbani, MD      . budesonide (PULMICORT) nebulizer solution 0.25 mg  2 mL Inhalation BID Juluis Mire, MD      . Derrill Memo ON 02/26/2015] calcitRIOL (ROCALTROL) capsule 2 mcg  2 mcg Oral Q T,Th,Sa-HD Alric Seton, PA-C      . calcium acetate (PHOSLO) capsule 667 mg  667 mg Oral BID WC Marjan Rabbani, MD      . Darbepoetin Alfa (ARANESP) injection 150 mcg  150 mcg Intravenous Q Sat-HD Alric Seton, PA-C      . docusate sodium (COLACE) capsule 100 mg  100 mg Oral BID PRN Marjan Rabbani, MD      . enoxaparin (LOVENOX) injection 30 mg  30 mg Subcutaneous Q24H Marjan Rabbani, MD      . feeding supplement (NEPRO CARB STEADY) liquid 237 mL  237 mL Oral BID BM Alric Seton, PA-C      . ferric gluconate (NULECIT) 125 mg in sodium chloride 0.9 % 100 mL IVPB  125 mg Intravenous Q Sat-HD Alric Seton, PA-C      . hydrALAZINE (APRESOLINE) tablet 10 mg  10 mg Oral BID Marjan Rabbani, MD      . imatinib (GLEEVEC) tablet 100 mg  100 mg Oral Q24H Marjan Rabbani, MD      . loratadine (CLARITIN) tablet 10 mg  10 mg Oral Daily Marjan Rabbani, MD      . multivitamin (RENA-VIT) tablet 1 tablet  1 tablet Oral QHS Marjan Rabbani, MD      . pantoprazole (PROTONIX) EC tablet 40 mg  40 mg Oral Daily Marjan Rabbani, MD      . polyethylene glycol (MIRALAX / GLYCOLAX) packet 17 g  17 g Oral Daily PRN Marjan Rabbani, MD      . sodium chloride 0.9 % injection 3 mL  3 mL Intravenous Q12H Marjan Rabbani, MD      . traMADol (ULTRAM) tablet 50 mg  50 mg Oral Q6H PRN Juluis Mire, MD  Labs: Basic Metabolic Panel:  Recent Labs Lab 02/23/15 0920  NA 138  K 3.3*  CL 96*  CO2 28  GLUCOSE 130*  BUN 15  CREATININE 6.60*  CALCIUM 9.0   Liver Function Tests:  Recent Labs Lab 02/23/15 0920  AST 19  ALT 8*  ALKPHOS 78  BILITOT 0.4  PROT 7.8  ALBUMIN 3.0*   CBC:  Recent Labs Lab 02/23/15 0920  WBC 4.5  NEUTROABS 2.9  HGB 9.2*  HCT 29.3*  MCV 84.7  PLT 219  CBG:  Recent Labs Lab  02/23/15 1356  GLUCAP 177*  Studies/Results: Dg Chest 2 View  02/23/2015   CLINICAL DATA:  Midsternal chest pain since this morning with cough since yesterday and shortness-of-breath one year.  EXAM: CHEST  2 VIEW  COMPARISON:  01/31/2015  FINDINGS: Lungs are somewhat hypoinflated with no opacification over the right base likely right-sided effusion with atelectasis as cannot exclude infection in the right mid to lower lung. Minimal linear atelectasis over the left midlung. Stable cardiomegaly. Calcified plaque over the thoracoabdominal aorta. There are degenerate changes of the spine. Stent over the right upper arm unchanged.  IMPRESSION: New opacification over the right mid to lower lung likely effusion with atelectasis. Cannot exclude infection in the right mid to lower lung.  Stable cardiomegaly.   Electronically Signed   By: Marin Olp M.D.   On: 02/23/2015 09:50    ROS: As per HPI otherwise negative. Physical Exam: Filed Vitals:   02/23/15 1130 02/23/15 1200 02/23/15 1215 02/23/15 1334  BP: 163/62 158/60 158/61 153/60  Pulse: 107 108 111 110  Temp:    99.1 F (37.3 C)  TempSrc:    Oral  Resp: 23 27 26 18   Height:    5\' 2"  (1.575 m)  Weight:    68.493 kg (151 lb)  SpO2: 99% 100% 99% 98%     General: Well developed, well nourished, in no acute distress. Head: Normocephalic, atraumatic, sclera non-icteric, mucus membranes are moist Neck: Supple. JVD not elevated. Lungs: Dim BS, coughs with deep inspiration Breathing is unlabored. Heart:  Tachycardic regular ~120 Abdomen: Soft, non-tender, non-distended with normoactive bowel sounds. No rebound/guarding. No obvious abdominal masses. M-S:  Strength and tone appear normal for age. Lower extremities:without edema or ischemic changes, no open wounds  Neuro: Alert and oriented X 3. Moves all extremities spontaneously. Psych:  Responds to questions appropriately with a normal affect. Dialysis Access: right upper AVGG + bruit and  thrill  Dialysis Orders: Center: [facility] 3.5 hr 400/A 1.5 EDW 68.5 2 K 2 Ca right upper AVGG heaprin 5000 Mircera 150q 2 weeks last given 6/14 no Fe last venofer 50 6/2 calcitriol 2 TIW Recent labs: Hgb 9.6 29% sat 6/23 ferritin 601 1/28 iPTH 808 6/23 increasing  Assessment/Plan: 1. Chest pain and cough - CXR suggesting of R mid to lower lung zone  effusion vs atx, on home O2 and nebs, took NTG this am, more tachycardic recently.Low grade temp 99.1, Trend troponins, cards to be consulted. Decrease volume with HD 2. ESRD -  TTS - HD due today K 3.3 - use 4 K bath- leaving below EDW with very low gains and poor intake - needs lower edw; K may be lower than usual because she had two HD treatments back to back due to declot of [place] earlier this week 3. Hypertension/volume  - decrease volume - not on antihypertensive meds - states BP is up and down at outpt HD; pre HD BP  increasing - 150 - 180s with usual HR 90 - 100s;  HR noted to be more elevated the last two treatment in the 110s pre HD; post HD BP 170 - 190 with pt being below EDW pre HD with post HD weight of 67.8 6/23- gently lower edw 4. Anemia  - Hgb trending down - Mircera increased from 100 to 150 on 6/14, add weekly Fe, redose Aranesp 150 6/25 5. Metabolic bone disease -  Takes phoslo 1 daily although med list states BID - leave at BID dosing until we get P level back, continue  calcitriol 2 TIW 6. Nutrition - renal diet +vitamin, added supplements - plan change to regular diet to help with intake 7. Lab draws- has Fe studies and iPTH ordered - will cancel as these have just been drawn 6/23 at the outpatient HD and resulted above under dialysis orders. Have also d/c'd Vit D level as she is already on calcitriol for iPTH and it is being titrated up periodically. 8. GIST -  on Gleevac "palliative chemo" per Dr. Anabel Bene- likely explains her poor intake; likely losing weight 9. DNR  Myriam Jacobson, PA-C [place]  [phone] 02/23/2015, 2:19 PM   I have seen and examined this patient and agree with plan and assessment as outlined in the above note. Pt admitted with CP, cough, changes R mid lung zone poss vol vs atelectasis. Low outpt HD weight gains and leaving below EDW - will try to reduce volume with dialysis as BP allows. HD tonight. 4K bath.  Other plans as noted. Uliana Brinker B,MD 02/23/2015 5:10 PM

## 2015-02-23 NOTE — H&P (Signed)
Date: 02/23/2015               Patient Name:  Annette Roach MRN: [id number]  DOB: 06-21-1928 Age / Sex: 81 y.o., female   PCP: Raina Mina, MD         Medical Service: Internal Medicine Teaching Service         Attending Physician: Dr. Sid Falcon, MD    First Contact: Dr. Genene Churn Pager: [phone]  Second Contact: Dr. Naaman Plummer Pager: [phone]       After Hours (After 5p/  First Contact Pager: [phone]  weekends / holidays): Second Contact Pager: [phone]   Chief Complaint: chest pain, occasional cough.  History of Present Illness:   81 yo female with hx of CAD, CHF EF 45-50%,, COPD, DM II, HTN, HLD, GIST presented with chest pain starting this morning around 6:30 AM. She was lying down and not doing any activity when the pain started. It was located at mid chest without radiation,  dull, 7/10. No SOB, palpitation, dizziness, or any other symptoms. It resolved by the time we saw him with Nitro x3.  Has been having dry cough for last 3 days. No fever/chills, sick contacts, travel. Has COPD with occasional SOB but not having any breathing issues currently. Has reported hx of CAD, had cath very long time ago per daughter. They are not sure about the results but only knows that her cardiologist Dr. Jenne Campus at [facility] told her she would need a pacemaker placed.   Denies any hx of blood clot, prolonged travel, calf pain. 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 Anabel Bene at [facility].  Denies any other symptoms currently. Due to HD today.  Meds: No current facility-administered medications for this encounter.   Current Outpatient Prescriptions  Medication Sig Dispense Refill  . acetaminophen (TYLENOL) 500 MG tablet Take 500 mg by mouth 2 (two) times daily as needed for moderate pain. For pain    . albuterol (PROVENTIL) (2.5 MG/3ML) 0.083% nebulizer solution Take 2.5 mg by nebulization every 6 (six) hours as needed for wheezing or shortness of breath.      Marland Kitchen amLODipine (NORVASC) 2.5 MG tablet Take 2.5 mg by mouth daily.    Marland Kitchen aspirin EC 81 MG tablet Take 81 mg by mouth at bedtime.     . Azelastine HCl 0.15 % SOLN Place 1 spray into both nostrils daily.   3  . azithromycin (ZITHROMAX) 250 MG tablet Take 1 tablet (250 mg total) by mouth daily. 4 each 0  . beclomethasone (QVAR) 80 MCG/ACT inhaler Inhale 2 puffs into the lungs 2 (two) times daily.    . calcitRIOL (ROCALTROL) 0.25 MCG capsule Take 7 capsules (1.75 mcg total) by mouth every dialysis (secondary hyperparathyroidism).    . calcium acetate (PHOSLO) 667 MG capsule Take 667 mg by mouth 2 (two) times daily before a meal.     . Docusate Calcium (STOOL SOFTENER PO) Take 1 tablet by mouth as needed (for constipation).    Marland Kitchen ethyl chloride spray Apply 1 application topically daily.   12  . hydrALAZINE (APRESOLINE) 10 MG tablet Take 10 mg by mouth 2 (two) times daily.    Marland Kitchen ipratropium-albuterol (DUONEB) 0.5-2.5 (3) MG/3ML SOLN 3 mLs every 4 (four) hours as needed (wheezing or shortness of breath).   3  . isosorbide mononitrate (IMDUR) 60 MG 24 hr tablet Take 60 mg by mouth.    . multivitamin (RENA-VIT) TABS tablet Take 1 tablet by mouth  daily.    . nitroGLYCERIN (NITROSTAT) 0.4 MG SL tablet Place 0.4 mg under the tongue every 5 (five) minutes as needed for chest pain.    . Nutritional Supplements (FEEDING SUPPLEMENT, NEPRO CARB STEADY,) LIQD Take 237 mLs by mouth as needed (missed meal during dialysis.). 30 Can 0  . pantoprazole (PROTONIX) 40 MG tablet Take 40 mg by mouth daily.    . polyethylene glycol (MIRALAX / GLYCOLAX) packet Take 17 g by mouth daily.    . simvastatin (ZOCOR) 5 MG tablet Take 5 mg by mouth at bedtime.     . VENTOLIN HFA 108 (90 BASE) MCG/ACT inhaler Inhale into the lungs.   3    Allergies: Allergies as of 02/23/2015  . (No Known Allergies)   Past Medical History  Diagnosis Date  . COPD (chronic obstructive pulmonary disease)     emphysema  . Neuropathy   .  Hyperlipidemia   . Diabetes mellitus   . Depression   . Anemia   . Shortness of breath   . GERD (gastroesophageal reflux disease)   . Hyperparathyroidism   . Hypertension     sees Dr. Gilford Rile  . Headache(784.0)   . Arthritis   . CAD (coronary artery disease)     sees Dr. Jenne Campus, [facility] cardiology [facility], [place]  . 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 Wears glasses   . Wears hearing aid     right  . Presence of surgically created AV shunt for hemodialysis     old lt upper arm out-rt upper arm shunt in  . Anginal pain     Dr Raliegh Ip in [place], [place]  . History of kidney stones   . Constipation   . History of blood transfusion   . Asthma     years ago  . Chronic kidney disease     TTH [facility] [place], [place]- Hemo   Past Surgical History  Procedure Laterality Date  . Abdominal hysterectomy  1976  . Kidney stone surgery  2013  . Av fistula placement  01/20/2012    Procedure: ARTERIOVENOUS (AV) FISTULA CREATION;  Surgeon: Elam Dutch, MD;  Location: [place];  Service: Vascular;  Laterality: Left;  . Hemodialysis catheter  05/25/12    secondary to failed AVF   . Eye surgery      bilateral cataract removed  . Av fistula placement  09/05/2012    Procedure: INSERTION OF ARTERIOVENOUS (AV) GORE-TEX GRAFT ARM;  Surgeon: Elam Dutch, MD;  Location: [facility] OR;  Service: Vascular;  Laterality: Left;  Using 4-59mm x 45 cm Vascular stretch goretex graft  . Ligation of arteriovenous  fistula  09/05/2012    Procedure: LIGATION OF ARTERIOVENOUS  FISTULA;  Surgeon: Elam Dutch, MD;  Location: [place];  Service: Vascular;  Laterality: Left;  . Ligation arteriovenous gortex graft  09/05/2012    Procedure: LIGATION ARTERIOVENOUS GORTEX GRAFT;  Surgeon: Elam Dutch, MD;  Location: [place];  Service: Vascular;  Laterality: Left;  . Av fistula placement Right 11/07/2012    Procedure: ARTERIOVENOUS (AV) FISTULA CREATION;  Surgeon: Elam Dutch, MD;  Location: [facility]  OR;  Service: Vascular;  Laterality: Right;  Bascilic Vein Fistula  . Appendectomy    . Carpal tunnel release Left 02/10/2013    Procedure: CARPAL TUNNEL RELEASE;  Surgeon: Cammie Sickle., MD;  Location: [employer];  Service: Orthopedics;  Laterality: Left;  . Bascilic vein transposition Right  03/15/2013    Procedure: 2ND STAGE BASCILIC VEIN TRANSPOSITION - RIGHT ARM;  Surgeon: Elam Dutch, MD;  Location: [place];  Service: Vascular;  Laterality: Right;  . Av fistula placement Right 05/15/2013    Procedure: INSERTION OF ARTERIOVENOUS (AV) GORE-TEX GRAFT ARM-RIGHT;  Surgeon: Elam Dutch, MD;  Location: [place];  Service: Vascular;  Laterality: Right;  . Shuntogram N/A 07/27/2012    Procedure: [place] Mallet;  Surgeon: Rosetta Posner, MD;  Location: [facility] CATH LAB;  Service: Cardiovascular;  Laterality: N/A;   Family History  Problem Relation Age of Onset  . Diabetes Mother   . Cancer Father   . Deep vein thrombosis Brother   . Hypertension Brother    History   Social History  . Marital Status: Widowed    Spouse Name: N/A  . Number of Children: N/A  . Years of Education: N/A   Occupational History  . Not on file.   Social History Main Topics  . Smoking status: Never Smoker   . Smokeless tobacco: Never Used  . Alcohol Use: No  . Drug Use: No  . Sexual Activity: Not on file   Other Topics Concern  . Not on file   Social History Narrative    Review of Systems: Review of Systems  Constitutional: Negative for fever, chills and diaphoresis.  HENT: Negative for congestion and sore throat.   Eyes: Positive for photophobia.  Respiratory: Positive for cough. Negative for hemoptysis, sputum production, shortness of breath and wheezing.   Cardiovascular: Positive for chest pain. Negative for palpitations, orthopnea, leg swelling and PND.  Gastrointestinal: Negative for heartburn, nausea, vomiting, diarrhea and constipation.  Genitourinary:       Anuric.    Musculoskeletal: Negative.   Skin: Negative.   Neurological: Negative.  Negative for dizziness, seizures, loss of consciousness, weakness and headaches.  Endo/Heme/Allergies: Negative.   Psychiatric/Behavioral: Negative.      Physical Exam: Blood pressure 175/75, pulse 106, temperature 98.4 F (36.9 C), temperature source Oral, resp. rate 31, height 5\' 2"  (1.575 m), weight 151 lb (68.493 kg), SpO2 97 %. Physical Exam  Constitutional: She is oriented to person, place, and time. She appears well-developed and well-nourished. No distress.  HENT:  Head: Normocephalic and atraumatic.  Mouth/Throat: Oropharynx is clear and moist. No oropharyngeal exudate.  Eyes: Conjunctivae and EOM are normal. Pupils are equal, round, and reactive to light. No scleral icterus.  Neck: Normal range of motion. No JVD present.  Cardiovascular: Regular rhythm and normal heart sounds.  Exam reveals no friction rub.   No murmur heard. tachycardic  Respiratory: Effort normal and breath sounds normal. No respiratory distress. She has no wheezes. She has no rales. She exhibits no tenderness.  GI: Soft. Bowel sounds are normal. She exhibits no distension. There is no tenderness. There is no rebound.  Musculoskeletal: Normal range of motion. She exhibits no edema or tenderness.  Neurological: She is alert and oriented to person, place, and time. No cranial nerve deficit.  Skin: She is not diaphoretic.    Lab results: Basic Metabolic Panel:  Recent Labs  02/23/15 0920  NA 138  K 3.3*  CL 96*  CO2 28  GLUCOSE 130*  BUN 15  CREATININE 6.60*  CALCIUM 9.0   Liver Function Tests:  Recent Labs  02/23/15 0920  AST 19  ALT 8*  ALKPHOS 78  BILITOT 0.4  PROT 7.8  ALBUMIN 3.0*   No results for input(s): LIPASE, AMYLASE in the last 72 hours. No results  for input(s): AMMONIA in the last 72 hours. CBC:  Recent Labs  02/23/15 0920  WBC 4.5  NEUTROABS 2.9  HGB 9.2*  HCT 29.3*  MCV 84.7  PLT 219    Cardiac Enzymes: No results for input(s): CKTOTAL, CKMB, CKMBINDEX, TROPONINI in the last 72 hours. BNP: No results for input(s): PROBNP in the last 72 hours. D-Dimer: No results for input(s): DDIMER in the last 72 hours. CBG: No results for input(s): GLUCAP in the last 72 hours. Hemoglobin A1C: No results for input(s): HGBA1C in the last 72 hours. Fasting Lipid Panel: No results for input(s): CHOL, HDL, LDLCALC, TRIG, CHOLHDL, LDLDIRECT in the last 72 hours. Thyroid Function Tests: No results for input(s): TSH, T4TOTAL, FREET4, T3FREE, THYROIDAB in the last 72 hours. Anemia Panel: No results for input(s): VITAMINB12, FOLATE, FERRITIN, TIBC, IRON, RETICCTPCT in the last 72 hours. Coagulation: No results for input(s): LABPROT, INR in the last 72 hours. Urine Drug Screen: Drugs of Abuse     Component Value Date/Time   LABOPIA NONE DETECTED 12/25/2014 1517   COCAINSCRNUR NONE DETECTED 12/25/2014 1517   LABBENZ NONE DETECTED 12/25/2014 1517   AMPHETMU NONE DETECTED 12/25/2014 1517   THCU NONE DETECTED 12/25/2014 1517   LABBARB NONE DETECTED 12/25/2014 1517    Alcohol Level: No results for input(s): ETH in the last 72 hours. Urinalysis: No results for input(s): COLORURINE, LABSPEC, PHURINE, GLUCOSEU, HGBUR, BILIRUBINUR, KETONESUR, PROTEINUR, UROBILINOGEN, NITRITE, LEUKOCYTESUR in the last 72 hours.  Invalid input(s): APPERANCEUR Misc. Labs:  Imaging results:  Dg Chest 2 View  02/23/2015   CLINICAL DATA:  Midsternal chest pain since this morning with cough since yesterday and shortness-of-breath one year.  EXAM: CHEST  2 VIEW  COMPARISON:  01/31/2015  FINDINGS: Lungs are somewhat hypoinflated with no opacification over the right base likely right-sided effusion with atelectasis as cannot exclude infection in the right mid to lower lung. Minimal linear atelectasis over the left midlung. Stable cardiomegaly. Calcified plaque over the thoracoabdominal aorta. There are degenerate  changes of the spine. Stent over the right upper arm unchanged.  IMPRESSION: New opacification over the right mid to lower lung likely effusion with atelectasis. Cannot exclude infection in the right mid to lower lung.  Stable cardiomegaly.   Electronically Signed   By: Marin Olp M.D.   On: 02/23/2015 09:50    Other results: EKG: sinus tach, LA enlargement, RBBB, prolonged QTC 607. Unchanged from prior.   Assessment & Plan by Problem: Active Problems:   Chest pain  81 yo female with hx of CHF, COPD, CAD here with chest pain.  Chest pain - substernal. TIMI score at least 3. With reported CAD  unclear etiology: could be just her chronic again (al though states this is different from her usual chronic angina), but at risk of PE (with her current GIST) - [place] moderate 2.5, [place] 8 moderate, and ASC - TIMI 3+.  Is tachycardic. Chest pain resolved with 3x nitro. CXR currently shows RLL/RML effusion vs atelectasis. Had HCAP on RLL on 4/30 but CXR on 12/2014 did not show this. No EKG changes, trop mildly elevated in the settting of ESRD 0.10.  [facility] cardiology [facility], [place] Dr. Jenne Campus. Will try to get records but may be difficult over the weekend. No recent cath or stress test per family. May benefit from stress test. - trend trops.  - check d-dimer, if elevated will need CT angio as she is moderate risk for PE.  - repeat CXR tomorrow after HD,  effusion could be from volume overload.  - consult cardiology as patient high risk of ACS - give asa full x1 then cont asa 81, cont nitro prn.   ESRD on T, Th, Sat HD-due today - calcitriol, phoslo rena-Vit, weekly FE, aranesp,  - consult nephro for HD today.  Cough - started 3 days ago, non productive. No SOB.  - could be just mild worsening of her COPD either from a viral infection or could also be a side effect of Gleevec - CXR is not clear for pneumonia, no fever or other signs of infection. - repeat CXR. Do Incenstive  spirometer as could have atelectasis on CXR - tessalon for cough  CHF - EF 22-29%, diastolic function could not assessed  - appears euvolemic now.  Chronic normocytic anemia 2/2 to CKD - stable around 9-10. Monitor  HTN - at home on hydral 10 BID, cont here  Intraventricular Conduction delay and QTC prolongation - hold QTC prolonging meds - f/up with   COPD -  - cont albuterol prn, qvar, ventolin  GIST - follows up with Dr. Anabel Bene @[place]   not surgically resectable per EMR, supposed to be on [place] for lifetime (it's cytostatic, not cytocidal) - cont gleevec.   Dispo: Disposition is deferred at this time, awaiting improvement of current medical problems. Anticipated discharge in approximately 2-3 day(s).   The patient does have a current PCP Raina Mina, MD) and does need an [facility] hospital follow-up appointment after discharge.  The patient does have transportation limitations that hinder transportation to clinic appointments.  Signed: Dellia Nims, MD 02/23/2015, 11:23 AM

## 2015-02-23 NOTE — ED Notes (Signed)
Pt complaining of chest pain starting this morning. Pt denies nausea/dizziness. Pt took 1nitro this morning. Pain 7/10.

## 2015-02-23 NOTE — Consult Note (Addendum)
ANTICOAGULATION CONSULT NOTE - Initial Consult  Pharmacy Consult for Heparin and Coumadin Indication: pulmonary embolus  No Known Allergies  Patient Measurements: Height: 5\' 2"  (157.5 cm) Weight: 151 lb (68.493 kg) IBW/kg (Calculated) : 50.1 Heparin Dosing Weight:65kg  Vital Signs: Temp: 99.1 F (37.3 C) (06/25 1334) Temp Source: Oral (06/25 1334) BP: 153/60 mmHg (06/25 1334) Pulse Rate: 110 (06/25 1334)  Labs:  Recent Labs  02/23/15 0920 02/23/15 1500  HGB 9.2*  --   HCT 29.3*  --   PLT 219  --   CREATININE 6.60*  --   TROPONINI  --  0.12*    Estimated Creatinine Clearance: 5.7 mL/min (by C-G formula based on Cr of 6.6).   Medical History: Past Medical History  Diagnosis Date  . COPD (chronic obstructive pulmonary disease)     emphysema  . Neuropathy   . Hyperlipidemia   . Diabetes mellitus   . Depression   . Anemia   . Shortness of breath   . GERD (gastroesophageal reflux disease)   . Hyperparathyroidism   . Hypertension     sees Dr. Gilford Rile  . Headache(784.0)   . Arthritis   . CAD (coronary artery disease)     sees Dr. Jenne Campus, [facility] cardiology [facility], [place]  . 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 Wears glasses   . Wears hearing aid     right  . Presence of surgically created AV shunt for hemodialysis     old lt upper arm out-rt upper arm shunt in  . Anginal pain     Dr Raliegh Ip in [place], [place]  . History of kidney stones   . Constipation   . History of blood transfusion   . Asthma     years ago  . Chronic kidney disease     TTH SAT [place], [place]- Hemo    Medications:  No anticoagulants pta  Assessment: 87yof with ESRD on HD presented with chest pain. D-dimer elevated. CT angio positive for acute PE with evidence of right heart strain. Clot burden is small to moderate. She will begin IV heparin and coumadin. Baseline CBC wnl. She was on prophylactic lovenox with last dose given at 1547 so will avoid  bolus with heparin. She will need at least a 5 day overlap with heparin and coumadin.  Goal of Therapy:  Heparin level 0.3-0.7 units/ml Monitor platelets by anticoagulation protocol: Yes   Plan:  1) Begin heparin drip at 1100 units/hr 2) Check 8 hour heparin level 3) Coumadin 7.5mg  x 1 4) Coumadin education 5) Daily heparin level, CBC, INR  Deboraha Sprang 02/23/2015,5:56 PM

## 2015-02-23 NOTE — Consult Note (Addendum)
Admit date: 02/23/2015 Referring Physician  Dr. Daryll Drown Primary Physician  Dr. Bea Graff Primary Cardiologist  Dr. Agustin Cree in [place] Reason for Consultation:  Chest pain  HPI: 81 yo female with hx of CAD, CHF EF 45-50%, COPD, DM II, HTN, HLD, ESRD on HD, GIST (GI stromal tumor) presented with chest pain starting this morning around 6:30 AM. She was lying down and not doing any activity when the pain started. It was located at mid chest without radiation, dull, 7/10. No SOB, palpitation, dizziness, or any other symptoms. It resolved by the time she was in the ER after NTG.  She states that she was diaphoretic but no other symptoms.   She has been having dry cough for last 3 days. No fever/chills, sick contacts, travel. Has COPD with occasional SOB but not having any breathing issues currently. Has reported hx of CAD, had cath very long time ago per daughter. They are not sure about the results but only knows that her cardiologist Dr. Jenne Campus at [facility] told her she would need a pacemaker placed a few months ago but she says nothing ever came of it.  She denies any recent stress test.    Denies any hx of blood clot, prolonged travel, calf pain. 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 Anabel Bene at [facility].  Chest xray shows RLL/RML effusion vs. Atelectasis vs. PNA.  She had HCAP in RLL 4/30 but resolved by CXR 12/2014.  WBC is normal.  Initial troponin is mildly elevated at 0.1 but this is in the setting of ESRD.  EKG showed Sinus tachycardia with RBBB, LVH with repolarization abnormality.     PMH:   Past Medical History  Diagnosis Date  . COPD (chronic obstructive pulmonary disease)     emphysema  . Neuropathy   . Hyperlipidemia   . Diabetes mellitus   . Depression   . Anemia   . Shortness of breath   . GERD (gastroesophageal reflux disease)   . Hyperparathyroidism   . Hypertension     sees Dr. Gilford Rile  . Headache(784.0)   . Arthritis   . CAD  (coronary artery disease)     sees Dr. Jenne Campus, [facility] [place], [place]  . 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 Wears glasses   . Wears hearing aid     right  . Presence of surgically created AV shunt for hemodialysis     old lt upper arm out-rt upper arm shunt in  . Anginal pain     Dr Raliegh Ip in [place], [place]  . History of kidney stones   . Constipation   . History of blood transfusion   . Asthma     years ago  . Chronic kidney disease     [facility] [place], [place]- Hemo     PSH:   Past Surgical History  Procedure Laterality Date  . Abdominal hysterectomy  1976  . Kidney stone surgery  2013  . Av fistula placement  01/20/2012    Procedure: ARTERIOVENOUS (AV) FISTULA CREATION;  Surgeon: Elam Dutch, MD;  Location: [place];  Service: Vascular;  Laterality: Left;  . Hemodialysis catheter  05/25/12    secondary to failed AVF   . Eye surgery      bilateral cataract removed  . Av fistula placement  09/05/2012    Procedure: INSERTION OF ARTERIOVENOUS (AV) GORE-TEX GRAFT ARM;  Surgeon: Elam Dutch, MD;  Location: [facility]  OR;  Service: Vascular;  Laterality: Left;  Using 4-37mm x 45 cm Vascular stretch goretex graft  . Ligation of arteriovenous  fistula  09/05/2012    Procedure: LIGATION OF ARTERIOVENOUS  FISTULA;  Surgeon: Elam Dutch, MD;  Location: [place];  Service: Vascular;  Laterality: Left;  . Ligation arteriovenous gortex graft  09/05/2012    Procedure: LIGATION ARTERIOVENOUS GORTEX GRAFT;  Surgeon: Elam Dutch, MD;  Location: [place];  Service: Vascular;  Laterality: Left;  . Av fistula placement Right 11/07/2012    Procedure: ARTERIOVENOUS (AV) FISTULA CREATION;  Surgeon: Elam Dutch, MD;  Location: [facility] OR;  Service: Vascular;  Laterality: Right;  Bascilic Vein Fistula  . Appendectomy    . Carpal tunnel release Left 02/10/2013    Procedure: CARPAL TUNNEL RELEASE;  Surgeon: Cammie Sickle., MD;  Location: [place];   Service: Orthopedics;  Laterality: Left;  . Bascilic vein transposition Right 03/15/2013    Procedure: 2ND STAGE BASCILIC VEIN TRANSPOSITION - RIGHT ARM;  Surgeon: Elam Dutch, MD;  Location: [place];  Service: Vascular;  Laterality: Right;  . Av fistula placement Right 05/15/2013    Procedure: INSERTION OF ARTERIOVENOUS (AV) GORE-TEX GRAFT ARM-RIGHT;  Surgeon: Elam Dutch, MD;  Location: [place];  Service: Vascular;  Laterality: Right;  . Shuntogram N/A 07/27/2012    Procedure: Earney Mallet;  Surgeon: Rosetta Posner, MD;  Location: [facility] C[facility] LAB;  Service: Cardiovascular;  Laterality: N/A;    Allergies:  Review of patient's allergies indicates no known allergies. Prior to Admit Meds:   Prescriptions prior to admission  Medication Sig Dispense Refill Last Dose  . albuterol (PROVENTIL) (2.5 MG/3ML) 0.083% nebulizer solution Take 2.5 mg by nebulization every 6 (six) hours as needed for wheezing or shortness of breath.   02/23/2015 at Unknown time  . aspirin EC 81 MG tablet Take 81 mg by mouth at bedtime.    02/22/2015 at Unknown time  . Azelastine HCl 0.15 % SOLN Place 1 spray into both nostrils daily.   3 02/22/2015 at Unknown time  . beclomethasone (QVAR) 80 MCG/ACT inhaler Inhale 2 puffs into the lungs 2 (two) times daily.   02/22/2015 at Unknown time  . calcium acetate (PHOSLO) 667 MG capsule Take 667 mg by mouth 2 (two) times daily before a meal.    02/22/2015 at Unknown time  . docusate sodium (COLACE) 100 MG capsule Take 100 mg by mouth 2 (two) times daily as needed for mild constipation.   02/22/2015 at Unknown time  . hydrALAZINE (APRESOLINE) 10 MG tablet Take 10 mg by mouth 2 (two) times daily.   02/22/2015 at Unknown time  . imatinib (GLEEVEC) 100 MG tablet Take 100 mg by mouth daily. Take at 3:30pm daily   02/22/2015 at Unknown time  . multivitamin (RENA-VIT) TABS tablet Take 1 tablet by mouth daily.   02/22/2015 at Unknown time  . nitroGLYCERIN (NITROSTAT) 0.4 MG SL tablet Place 0.4 mg under the  tongue every 5 (five) minutes as needed for chest pain.   02/23/2015 at Unknown time  . pantoprazole (PROTONIX) 40 MG tablet Take 40 mg by mouth daily.   02/22/2015 at Unknown time  . polyethylene glycol (MIRALAX / GLYCOLAX) packet Take 17 g by mouth daily.   02/22/2015 at Unknown time  . Psyllium (DIETARY FIBER LAXATIVE PO) Take 1 capsule by mouth daily as needed (Constipation).   02/22/2015 at Unknown time  . traMADol (ULTRAM) 50 MG tablet Take 50 mg by  mouth every 6 (six) hours as needed for moderate pain.   02/23/2015 at Unknown time  . VENTOLIN HFA 108 (90 BASE) MCG/ACT inhaler Inhale 2 puffs into the lungs every 6 (six) hours as needed for wheezing or shortness of breath.   3 02/23/2015 at Unknown time  . acetaminophen (TYLENOL) 500 MG tablet Take 500 mg by mouth 2 (two) times daily as needed for moderate pain. For pain   Not Taking (PRN med) at Unknown time  . calcitRIOL (ROCALTROL) 0.25 MCG capsule Take 7 capsules (1.75 mcg total) by mouth every dialysis (secondary hyperparathyroidism). (Patient not taking: Reported on 02/23/2015)   Not Taking at Unknown time  . Nutritional Supplements (FEEDING SUPPLEMENT, NEPRO CARB STEADY,) LIQD Take 237 mLs by mouth as needed (missed meal during dialysis.). (Patient not taking: Reported on 02/23/2015) 30 Can 0 Not Taking at Unknown time  . ondansetron (ZOFRAN) 4 MG tablet 1 TAB EVERY 4 HOURS AS NEEDED FOR CHEMO INDUCED NAUSEA (MAY TAKE AROUND CLOCK FOR PERSISTENT SYMPTO  2 Not Taking (PRN med) at Unknown time  . prochlorperazine (COMPAZINE) 10 MG tablet TAKE 1 TABLET EVERY 6 HOURS AS NEEDED FOR CHEMO NAUSEA-MAY TAKE AROUND THE CLOCK FOR PERSISTENT SYMP  2 Not Taking (PRN med)   Fam HX:    Family History  Problem Relation Age of Onset  . Diabetes Mother   . Cancer Father   . Deep vein thrombosis Brother   . Hypertension Brother    Social HX:    History   Social History  . Marital Status: Widowed    Spouse Name: N/A  . Number of Children: N/A  . Years of  Education: N/A   Occupational History  . Not on file.   Social History Main Topics  . Smoking status: Never Smoker   . Smokeless tobacco: Never Used  . Alcohol Use: No  . Drug Use: No  . Sexual Activity: Not on file   Other Topics Concern  . Not on file   Social History Narrative     ROS:  All 11 ROS were addressed and are negative except what is stated in the HPI  Physical Exam: Blood pressure 153/60, pulse 110, temperature 99.1 F (37.3 C), temperature source Oral, resp. rate 18, height 5\' 2"  (1.575 m), weight 151 lb (68.493 kg), SpO2 98 %.    General: Well developed, well nourished, in no acute distress Head: Eyes PERRLA, No xanthomas.   Normal cephalic and atramatic  Lungs:   Crackles at right  base Heart:   HRRR S1 S2 Pulses are 2+ & equal.            No carotid bruit. No JVD.  No abdominal bruits. No femoral bruits. Abdomen: Bowel sounds are positive, abdomen soft and non-tender without masses  Extremities:   No clubbing, cyanosis or edema.  DP +1 Neuro: Alert and oriented X 3. Psych:  Good affect, responds appropriately    Labs:   Lab Results  Component Value Date   WBC 4.5 02/23/2015   HGB 9.2* 02/23/2015   HCT 29.3* 02/23/2015   MCV 84.7 02/23/2015   PLT 219 02/23/2015    Recent Labs Lab 02/23/15 0920  NA 138  K 3.3*  CL 96*  CO2 28  BUN 15  CREATININE 6.60*  CALCIUM 9.0  PROT 7.8  BILITOT 0.4  ALKPHOS 78  ALT 8*  AST 19  GLUCOSE 130*   No results found for: PTT Lab Results  Component Value Date  INR 1.23 12/25/2014   INR 1.11 10/27/2014   INR 0.99 05/25/2012   Lab Results  Component Value Date   TROPONINI 0.03 11/18/2014     Lab Results  Component Value Date   CHOL 137 12/26/2014   Lab Results  Component Value Date   HDL 30* 12/26/2014   Lab Results  Component Value Date   LDLCALC 83 12/26/2014   Lab Results  Component Value Date   TRIG 119 12/26/2014   Lab Results  Component Value Date   CHOLHDL 4.6 12/26/2014    No results found for: LDLDIRECT    Radiology:  Dg Chest 2 View  02/23/2015   CLINICAL DATA:  Midsternal chest pain since this morning with cough since yesterday and shortness-of-breath one year.  EXAM: CHEST  2 VIEW  COMPARISON:  01/31/2015  FINDINGS: Lungs are somewhat hypoinflated with no opacification over the right base likely right-sided effusion with atelectasis as cannot exclude infection in the right mid to lower lung. Minimal linear atelectasis over the left midlung. Stable cardiomegaly. Calcified plaque over the thoracoabdominal aorta. There are degenerate changes of the spine. Stent over the right upper arm unchanged.  IMPRESSION: New opacification over the right mid to lower lung likely effusion with atelectasis. Cannot exclude infection in the right mid to lower lung.  Stable cardiomegaly.   Electronically Signed   By: Marin Olp M.D.   On: 02/23/2015 09:50    EKG:  Sinus tachycardia with RBBB, LVH by voltage with repolarization abnormality  ASSESSMENT/PLAN:  1.  Chest pain syndrome which is somewhat atypical.  It occurred at rest and nothing made it worse.  She says that nothing made it better but she had no further CP after 3 SL NTG.  EKG is unchanged with baseline RBBB.  There were no associated symptoms and she has not had any chest pain in some time.  Trop is mildly elevated but this is in the setting of ESRD.  She is pain free.  Will need to get records from her Cardiologist in [place].  Will cycle cardiac enzymes to see how they trend.  Check 2D echo to assess LVF.  Check D-Dimer.  Continue ASA. 2.  ASCAD - apparently had a cath a long time ago but did not know the results.   3.  RBBB/? Bradycardia in the past - she was told by her cardiologist a few months back that she may need PPM but this never occurred.  Again will need to get old records.  Will follow on telemetry.  She is mildly tachycardic today. Check TSH 4.  COPD 6.  Hypokalemia - replete per renal 7.  HTN -  borderline controlled - increase Hydralazine as needed for control.  Avoid BB with ? History of bradycardia in the past. 8.  Chronic combined systolic/diastolic CHF (EF 58-09%)- check BNP with effusion noted on chest xray.  Continue hydralazine.  No BB with history of bradycardia.      Sueanne Margarita, MD  02/23/2015  2:23 PM

## 2015-02-23 NOTE — ED Notes (Signed)
Attempted to call report x 1  

## 2015-02-23 NOTE — ED Provider Notes (Signed)
CSN: [id number]     Arrival date & time 02/23/15  0844 History   First MD Initiated Contact with Patient 02/23/15 [phone]     Chief Complaint  Patient presents with  . Chest Pain     (Consider location/radiation/quality/duration/timing/severity/associated sxs/prior Treatment) Patient is a 81 y.o. female presenting with chest pain. The history is provided by the patient.  Chest Pain Pain location:  Substernal area Associated symptoms: cough   Associated symptoms: no abdominal pain, no back pain, no headache, no nausea, no numbness, no shortness of breath, not vomiting and no weakness    patient developed some substernal chest pain today. It is dull in her mid chest. Was not there yesterday. No fevers. No real shortness of breath. She is due for dialysis today. Occasional cough. No sputum production. She did not take her medicines this morning. No nausea vomiting. No headache. Confusion. No diaphoresis. She has a reported history of coronary artery disease and her daughter states she was supposed to get a pacemaker and AICD.  Past Medical History  Diagnosis Date  . COPD (chronic obstructive pulmonary disease)     emphysema  . Neuropathy   . Hyperlipidemia   . Diabetes mellitus   . Depression   . Anemia   . Shortness of breath   . GERD (gastroesophageal reflux disease)   . Hyperparathyroidism   . Hypertension     sees Dr. Gilford Rile  . Headache(784.0)   . Arthritis   . CAD (coronary artery disease)     sees Dr. Jenne Campus, [facility], [place]  . 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 Wears glasses   . Wears hearing aid     right  . Presence of surgically created AV shunt for hemodialysis     old lt upper arm out-rt upper arm shunt in  . Anginal pain     Dr Raliegh Ip in [place], [place]  . History of kidney stones   . Constipation   . History of blood transfusion   . Asthma     years ago  . Chronic kidney disease     TTH SAT [place], [place]  [place]   Past Surgical History  Procedure Laterality Date  . Abdominal hysterectomy  1976  . Kidney stone surgery  2013  . Av fistula placement  01/20/2012    Procedure: ARTERIOVENOUS (AV) FISTULA CREATION;  Surgeon: Elam Dutch, MD;  Location: [place];  Service: Vascular;  Laterality: Left;  . Hemodialysis catheter  05/25/12    secondary to failed AVF   . Eye surgery      bilateral cataract removed  . Av fistula placement  09/05/2012    Procedure: INSERTION OF ARTERIOVENOUS (AV) GORE-TEX GRAFT ARM;  Surgeon: Elam Dutch, MD;  Location: [facility];  Service: Vascular;  Laterality: Left;  Using 4-28mm x 45 cm Vascular stretch goretex graft  . Ligation of arteriovenous  fistula  09/05/2012    Procedure: LIGATION OF ARTERIOVENOUS  FISTULA;  Surgeon: Elam Dutch, MD;  Location: [place];  Service: Vascular;  Laterality: Left;  . Ligation arteriovenous gortex graft  09/05/2012    Procedure: LIGATION ARTERIOVENOUS GORTEX GRAFT;  Surgeon: Elam Dutch, MD;  Location: [place];  Service: Vascular;  Laterality: Left;  . Av fistula placement Right 11/07/2012    Procedure: ARTERIOVENOUS (AV) FISTULA CREATION;  Surgeon: Elam Dutch, MD;  Location: [place];  Service: Vascular;  Laterality: Right;  Bascilic Vein  Fistula  . Appendectomy    . Carpal tunnel release Left 02/10/2013    Procedure: CARPAL TUNNEL RELEASE;  Surgeon: Cammie Sickle., MD;  Location: [place];  Service: Orthopedics;  Laterality: Left;  . Bascilic vein transposition Right 03/15/2013    Procedure: 2ND STAGE BASCILIC VEIN TRANSPOSITION - RIGHT ARM;  Surgeon: Elam Dutch, MD;  Location: [place];  Service: Vascular;  Laterality: Right;  . Av fistula placement Right 05/15/2013    Procedure: INSERTION OF ARTERIOVENOUS (AV) GORE-TEX GRAFT ARM-RIGHT;  Surgeon: Elam Dutch, MD;  Location: [place];  Service: Vascular;  Laterality: Right;  . Shuntogram N/A 07/27/2012    Procedure: Earney Mallet;  Surgeon: Rosetta Posner, MD;   Location: [facility] CATH LAB;  Service: Cardiovascular;  Laterality: N/A;   Family History  Problem Relation Age of Onset  . Diabetes Mother   . Cancer Father   . Deep vein thrombosis Brother   . Hypertension Brother    History  Substance Use Topics  . Smoking status: Never Smoker   . Smokeless tobacco: Never Used  . Alcohol Use: No   OB History    No data available     Review of Systems  Constitutional: Negative for activity change and appetite change.  Eyes: Negative for pain.  Respiratory: Positive for cough. Negative for chest tightness and shortness of breath.   Cardiovascular: Positive for chest pain. Negative for leg swelling.  Gastrointestinal: Negative for nausea, vomiting, abdominal pain and diarrhea.  Genitourinary: Negative for flank pain.  Musculoskeletal: Negative for back pain and neck stiffness.  Skin: Negative for rash.  Neurological: Negative for weakness, numbness and headaches.  Psychiatric/Behavioral: Negative for behavioral problems.      Allergies  Review of patient's allergies indicates no known allergies.  Home Medications   Prior to Admission medications   Medication Sig Start Date End Date Taking? Authorizing Provider  albuterol (PROVENTIL) (2.5 MG/3ML) 0.083% nebulizer solution Take 2.5 mg by nebulization every 6 (six) hours as needed for wheezing or shortness of breath.   Yes Historical Provider, MD  aspirin EC 81 MG tablet Take 81 mg by mouth at bedtime.    Yes Historical Provider, MD  Azelastine HCl 0.15 % SOLN Place 1 spray into both nostrils daily.  10/10/14  Yes Historical Provider, MD  beclomethasone (QVAR) 80 MCG/ACT inhaler Inhale 2 puffs into the lungs 2 (two) times daily.   Yes Historical Provider, MD  calcium acetate (PHOSLO) 667 MG capsule Take 667 mg by mouth 2 (two) times daily before a meal.    Yes Historical Provider, MD  docusate sodium (COLACE) 100 MG capsule Take 100 mg by mouth 2 (two) times daily as needed for mild constipation.    Yes Historical Provider, MD  hydrALAZINE (APRESOLINE) 10 MG tablet Take 10 mg by mouth 2 (two) times daily.   Yes Historical Provider, MD  imatinib (GLEEVEC) 100 MG tablet Take 100 mg by mouth daily. Take at 3:30pm daily 02/04/15  Yes Historical Provider, MD  multivitamin (RENA-VIT) TABS tablet Take 1 tablet by mouth daily.   Yes Historical Provider, MD  nitroGLYCERIN (NITROSTAT) 0.4 MG SL tablet Place 0.4 mg under the tongue every 5 (five) minutes as needed for chest pain.   Yes Historical Provider, MD  pantoprazole (PROTONIX) 40 MG tablet Take 40 mg by mouth daily.   Yes Historical Provider, MD  polyethylene glycol (MIRALAX / GLYCOLAX) packet Take 17 g by mouth daily.   Yes Historical Provider, MD  Psyllium (DIETARY FIBER LAXATIVE PO) Take 1 capsule by mouth daily as needed (Constipation).   Yes Historical Provider, MD  traMADol (ULTRAM) 50 MG tablet Take 50 mg by mouth every 6 (six) hours as needed for moderate pain.   Yes Historical Provider, MD  VENTOLIN HFA 108 (90 BASE) MCG/ACT inhaler Inhale 2 puffs into the lungs every 6 (six) hours as needed for wheezing or shortness of breath.  10/12/14  Yes Historical Provider, MD  acetaminophen (TYLENOL) 500 MG tablet Take 500 mg by mouth 2 (two) times daily as needed for moderate pain. For pain    Historical Provider, MD  calcitRIOL (ROCALTROL) 0.25 MCG capsule Take 7 capsules (1.75 mcg total) by mouth every dialysis (secondary hyperparathyroidism). Patient not taking: Reported on 02/23/2015 12/29/14   Cherene Altes, MD  Nutritional Supplements (FEEDING SUPPLEMENT, NEPRO CARB STEADY,) LIQD Take 237 mLs by mouth as needed (missed meal during dialysis.). Patient not taking: Reported on 02/23/2015 11/20/14   Belkys A Regalado, MD  ondansetron (ZOFRAN) 4 MG tablet 1 TAB EVERY 4 HOURS AS NEEDED FOR CHEMO INDUCED NAUSEA (MAY TAKE AROUND CLOCK FOR PERSISTENT SYMPTO 01/30/15   Historical Provider, MD  prochlorperazine (COMPAZINE) 10 MG tablet TAKE 1 TABLET EVERY 6  HOURS AS NEEDED FOR CHEMO NAUSEA-MAY TAKE AROUND THE CLOCK FOR PERSISTENT SYMP 01/30/15   Historical Provider, MD   BP 150/63 mmHg  Pulse 102  Temp(Src) 97.5 F (36.4 C) (Oral)  Resp 20  Ht 5\' 2"  (1.575 m)  Wt 146 lb 13.2 oz (66.6 kg)  BMI 26.85 kg/m2  SpO2 100% Physical Exam  Constitutional: She appears well-developed.  HENT:  Head: Atraumatic.  Neck: Neck supple.  Cardiovascular:  Mild tachycardia  Pulmonary/Chest: Effort normal.  Rare scattered rales  Abdominal: Soft. There is no tenderness.  Musculoskeletal: She exhibits no edema.  Neurological: She is alert.  Skin: Skin is warm.    ED Course  Procedures (including critical care time) Labs Review Labs Reviewed  CBC WITH DIFFERENTIAL/PLATELET - Abnormal; Notable for the following:    RBC 3.46 (*)    Hemoglobin 9.2 (*)    HCT 29.3 (*)    RDW 17.8 (*)    All other components within normal limits  COMPREHENSIVE METABOLIC PANEL - Abnormal; Notable for the following:    Potassium 3.3 (*)    Chloride 96 (*)    Glucose, Bld 130 (*)    Creatinine, Ser 6.60 (*)    Albumin 3.0 (*)    ALT 8 (*)    GFR calc non Af Amer 5 (*)    GFR calc Af Amer 6 (*)    All other components within normal limits  TROPONIN I - Abnormal; Notable for the following:    Troponin I 0.12 (*)    All other components within normal limits  TROPONIN I - Abnormal; Notable for the following:    Troponin I 0.10 (*)    All other components within normal limits  PHOSPHORUS - Abnormal; Notable for the following:    Phosphorus 4.9 (*)    All other components within normal limits  RETICULOCYTES - Abnormal; Notable for the following:    Retic Ct Pct 3.3 (*)    RBC. 3.44 (*)    All other components within normal limits  VITAMIN B12 - Abnormal; Notable for the following:    Vitamin B-12 1203 (*)    All other components within normal limits  D-DIMER, QUANTITATIVE (NOT AT [facility]) - Abnormal; Notable for the following:  D-Dimer, Quant 3.33 (*)    All other  components within normal limits  GLUCOSE, CAPILLARY - Abnormal; Notable for the following:    Glucose-Capillary 177 (*)    All other components within normal limits  BRAIN NATRIURETIC PEPTIDE - Abnormal; Notable for the following:    B Natriuretic Peptide 1623.7 (*)    All other components within normal limits  CBC - Abnormal; Notable for the following:    RBC 3.48 (*)    Hemoglobin 9.2 (*)    HCT 29.4 (*)    RDW 17.8 (*)    All other components within normal limits  PROTIME-INR - Abnormal; Notable for the following:    Prothrombin Time 15.7 (*)    All other components within normal limits  RENAL FUNCTION PANEL - Abnormal; Notable for the following:    Chloride 98 (*)    Glucose, Bld 125 (*)    BUN <5 (*)    Creatinine, Ser 3.51 (*)    Calcium 8.7 (*)    Albumin 2.8 (*)    GFR calc non Af Amer 11 (*)    GFR calc Af Amer 13 (*)    All other components within normal limits  GLUCOSE, CAPILLARY - Abnormal; Notable for the following:    Glucose-Capillary 123 (*)    All other components within normal limits  I-STAT TROPOININ, ED - Abnormal; Notable for the following:    Troponin i, poc 0.10 (*)    All other components within normal limits  FOLATE  MAGNESIUM  TECHNOLOGIST SMEAR REVIEW  TSH  GLUCOSE, CAPILLARY  HEPARIN LEVEL (UNFRACTIONATED)  HEMOGLOBIN A1C  HEPARIN LEVEL (UNFRACTIONATED)    Imaging Review Dg Chest 2 View  02/23/2015   CLINICAL DATA:  Midsternal chest pain since this morning with cough since yesterday and shortness-of-breath one year.  EXAM: CHEST  2 VIEW  COMPARISON:  01/31/2015  FINDINGS: Lungs are somewhat hypoinflated with no opacification over the right base likely right-sided effusion with atelectasis as cannot exclude infection in the right mid to lower lung. Minimal linear atelectasis over the left midlung. Stable cardiomegaly. Calcified plaque over the thoracoabdominal aorta. There are degenerate changes of the spine. Stent over the right upper arm  unchanged.  IMPRESSION: New opacification over the right mid to lower lung likely effusion with atelectasis. Cannot exclude infection in the right mid to lower lung.  Stable cardiomegaly.   Electronically Signed   By: Marin Olp M.D.   On: 02/23/2015 09:50   Ct Angio Chest Pe W/cm &/or Wo Cm  02/23/2015   CLINICAL DATA:  Cough, chest pain, elevated D-dimer.  EXAM: CT ANGIOGRAPHY CHEST WITH CONTRAST  TECHNIQUE: Multidetector CT imaging of the chest was performed using the standard protocol during bolus administration of intravenous contrast. Multiplanar CT image reconstructions and MIPs were obtained to evaluate the vascular anatomy.  CONTRAST:  48mL OMNIPAQUE IOHEXOL 350 MG/ML SOLN  COMPARISON:  CT chest dated 12/27/2014  FINDINGS: Lobar/segmental pulmonary emboli within branches of the left upper and lower lobe pulmonary arteries (series 5/images 101 and 108).  Overall clot burden is small to moderate.  Elevated elevated RV to LV ratio of 1.1, at least raising the possibility of right heart strain.  Mediastinum/Nodes: Cardiomegaly.  No pericardial effusion.  Coronary atherosclerosis.  Atherosclerotic calcifications of the aortic arch.  Small mediastinal lymph nodes which do not meet pathologic CT size criteria.  Visualized thyroid is notable for coarse calcification in the right gland.  Lungs/Pleura: Patchy right lower lobe opacity, likely compressive  atelectasis. Moderate right and small left pleural effusions.  Scattered atelectasis in the posterior upper lobes and left lower lobe.  6 mm nodular opacity in the posterior left upper lobe (series 4/image 24), grossly unchanged.  No pneumothorax.  Upper abdomen: Visualized upper abdomen is notable for hepatic and splenic granulomata as well as vascular calcifications.  Musculoskeletal: Degenerative changes of the visualized thoracolumbar spine.  Superior endplate compression fracture deformities at L1 and L2.  Review of the MIP images confirms the above  findings.  IMPRESSION: Lobar/segmental pulmonary emboli within branches of the left upper and lower lobe pulmonary arteries. Overall clot burden is small to moderate. Elevated RV to LV ratio, at least raising the possibility of right heart strain.  Positive for acute PE with CT evidence of right heart strain (RV/LV Ratio = 1.1) consistent with at least submassive (intermediate risk) PE. The presence of right heart strain has been associated with an increased risk of morbidity and mortality. Please activate Code PE by paging [phone].  Critical Value/emergent results were called by telephone at the time of interpretation on 02/23/2015 at 5:38 pm to Dr. Laurance Flatten, who verbally acknowledged these results.   Electronically Signed   By: Julian Hy M.D.   On: 02/23/2015 17:44     EKG Interpretation   Date/Time:  Saturday February 23 2015 08:51:47 EDT Ventricular Rate:  115 PR Interval:  240 QRS Duration: 110 QT Interval:  386 QTC Calculation: 533 R Axis:   -48 Text Interpretation:  Sinus tachycardia with 1st degree A-V block with  Premature atrial complexes Left axis deviation Incomplete right bundle  branch block Left ventricular hypertrophy Prolonged QT Abnormal ECG  Confirmed by Alvino Chapel  MD, Lilymarie Scroggins [phone]) on 02/23/2015 8:59:36 AM      EKG Interpretation  Date/Time:  Saturday February 23 2015 10:28:06 EDT Ventricular Rate:  109 PR Interval:  83 QRS Duration: 123 QT Interval:  451 QTC Calculation: 607 R Axis:   -51 Text Interpretation:  Sinus tachycardia Left atrial enlargement IVCD, consider atypical RBBB LVH with IVCD and secondary repol abnrm Prolonged QT interval Confirmed by Alvino Chapel  MD, Ovid Curd [phone]) on 02/23/2015 10:33:40 AM        MDM   Final diagnoses:  Chest pain, unspecified chest pain type  End stage renal disease on dialysis    Patient with chest pain. End-stage renal failure. Due for dialysis. Will admit to internal medicine.    Davonna Belling,  MD 02/24/15 [phone]

## 2015-02-24 ENCOUNTER — Observation Stay (HOSPITAL_COMMUNITY): Payer: Medicare Other

## 2015-02-24 DIAGNOSIS — Z8249 Family history of ischemic heart disease and other diseases of the circulatory system: Secondary | ICD-10-CM | POA: Diagnosis not present

## 2015-02-24 DIAGNOSIS — I5042 Chronic combined systolic (congestive) and diastolic (congestive) heart failure: Secondary | ICD-10-CM | POA: Diagnosis present

## 2015-02-24 DIAGNOSIS — J449 Chronic obstructive pulmonary disease, unspecified: Secondary | ICD-10-CM

## 2015-02-24 DIAGNOSIS — I2510 Atherosclerotic heart disease of native coronary artery without angina pectoris: Secondary | ICD-10-CM

## 2015-02-24 DIAGNOSIS — I120 Hypertensive chronic kidney disease with stage 5 chronic kidney disease or end stage renal disease: Secondary | ICD-10-CM | POA: Diagnosis present

## 2015-02-24 DIAGNOSIS — C169 Malignant neoplasm of stomach, unspecified: Secondary | ICD-10-CM | POA: Diagnosis present

## 2015-02-24 DIAGNOSIS — C494 Malignant neoplasm of connective and soft tissue of abdomen: Secondary | ICD-10-CM

## 2015-02-24 DIAGNOSIS — K219 Gastro-esophageal reflux disease without esophagitis: Secondary | ICD-10-CM | POA: Diagnosis present

## 2015-02-24 DIAGNOSIS — D631 Anemia in chronic kidney disease: Secondary | ICD-10-CM

## 2015-02-24 DIAGNOSIS — R05 Cough: Secondary | ICD-10-CM

## 2015-02-24 DIAGNOSIS — I4510 Unspecified right bundle-branch block: Secondary | ICD-10-CM | POA: Diagnosis present

## 2015-02-24 DIAGNOSIS — J9811 Atelectasis: Secondary | ICD-10-CM | POA: Diagnosis present

## 2015-02-24 DIAGNOSIS — I272 Other secondary pulmonary hypertension: Secondary | ICD-10-CM | POA: Diagnosis present

## 2015-02-24 DIAGNOSIS — Z66 Do not resuscitate: Secondary | ICD-10-CM | POA: Diagnosis not present

## 2015-02-24 DIAGNOSIS — I2699 Other pulmonary embolism without acute cor pulmonale: Secondary | ICD-10-CM | POA: Diagnosis present

## 2015-02-24 DIAGNOSIS — I4519 Other right bundle-branch block: Secondary | ICD-10-CM

## 2015-02-24 DIAGNOSIS — E785 Hyperlipidemia, unspecified: Secondary | ICD-10-CM | POA: Diagnosis present

## 2015-02-24 DIAGNOSIS — Z833 Family history of diabetes mellitus: Secondary | ICD-10-CM | POA: Diagnosis not present

## 2015-02-24 DIAGNOSIS — Z809 Family history of malignant neoplasm, unspecified: Secondary | ICD-10-CM | POA: Diagnosis not present

## 2015-02-24 DIAGNOSIS — E1122 Type 2 diabetes mellitus with diabetic chronic kidney disease: Secondary | ICD-10-CM | POA: Diagnosis present

## 2015-02-24 DIAGNOSIS — N186 End stage renal disease: Secondary | ICD-10-CM

## 2015-02-24 DIAGNOSIS — I10 Essential (primary) hypertension: Secondary | ICD-10-CM | POA: Diagnosis not present

## 2015-02-24 DIAGNOSIS — I340 Nonrheumatic mitral (valve) insufficiency: Secondary | ICD-10-CM | POA: Diagnosis present

## 2015-02-24 DIAGNOSIS — Z992 Dependence on renal dialysis: Secondary | ICD-10-CM | POA: Diagnosis not present

## 2015-02-24 DIAGNOSIS — N2581 Secondary hyperparathyroidism of renal origin: Secondary | ICD-10-CM | POA: Diagnosis present

## 2015-02-24 DIAGNOSIS — Z7982 Long term (current) use of aspirin: Secondary | ICD-10-CM | POA: Diagnosis not present

## 2015-02-24 DIAGNOSIS — R079 Chest pain, unspecified: Secondary | ICD-10-CM | POA: Diagnosis present

## 2015-02-24 DIAGNOSIS — Z79899 Other long term (current) drug therapy: Secondary | ICD-10-CM | POA: Diagnosis not present

## 2015-02-24 DIAGNOSIS — R410 Disorientation, unspecified: Secondary | ICD-10-CM | POA: Diagnosis not present

## 2015-02-24 DIAGNOSIS — E1140 Type 2 diabetes mellitus with diabetic neuropathy, unspecified: Secondary | ICD-10-CM | POA: Diagnosis present

## 2015-02-24 DIAGNOSIS — Z7951 Long term (current) use of inhaled steroids: Secondary | ICD-10-CM | POA: Diagnosis not present

## 2015-02-24 DIAGNOSIS — R0789 Other chest pain: Secondary | ICD-10-CM | POA: Diagnosis not present

## 2015-02-24 DIAGNOSIS — Z9981 Dependence on supplemental oxygen: Secondary | ICD-10-CM | POA: Diagnosis not present

## 2015-02-24 DIAGNOSIS — I25118 Atherosclerotic heart disease of native coronary artery with other forms of angina pectoris: Secondary | ICD-10-CM | POA: Diagnosis present

## 2015-02-24 DIAGNOSIS — J90 Pleural effusion, not elsewhere classified: Secondary | ICD-10-CM

## 2015-02-24 DIAGNOSIS — E876 Hypokalemia: Secondary | ICD-10-CM | POA: Diagnosis present

## 2015-02-24 DIAGNOSIS — R7989 Other specified abnormal findings of blood chemistry: Secondary | ICD-10-CM | POA: Diagnosis not present

## 2015-02-24 LAB — CBC
HCT: 29.4 % — ABNORMAL LOW (ref 36.0–46.0)
HEMOGLOBIN: 9.2 g/dL — AB (ref 12.0–15.0)
MCH: 26.4 pg (ref 26.0–34.0)
MCHC: 31.3 g/dL (ref 30.0–36.0)
MCV: 84.5 fL (ref 78.0–100.0)
Platelets: 177 10*3/uL (ref 150–400)
RBC: 3.48 MIL/uL — ABNORMAL LOW (ref 3.87–5.11)
RDW: 17.8 % — AB (ref 11.5–15.5)
WBC: 4.6 10*3/uL (ref 4.0–10.5)

## 2015-02-24 LAB — GLUCOSE, CAPILLARY
GLUCOSE-CAPILLARY: 133 mg/dL — AB (ref 65–99)
Glucose-Capillary: 121 mg/dL — ABNORMAL HIGH (ref 65–99)
Glucose-Capillary: 124 mg/dL — ABNORMAL HIGH (ref 65–99)
Glucose-Capillary: 114 mg/dL — ABNORMAL HIGH (ref 65–99)

## 2015-02-24 LAB — PROTIME-INR
INR: 1.24 (ref 0.00–1.49)
PROTHROMBIN TIME: 15.7 s — AB (ref 11.6–15.2)

## 2015-02-24 LAB — RENAL FUNCTION PANEL
ALBUMIN: 2.8 g/dL — AB (ref 3.5–5.0)
ANION GAP: 8 (ref 5–15)
CO2: 30 mmol/L (ref 22–32)
Calcium: 8.7 mg/dL — ABNORMAL LOW (ref 8.9–10.3)
Chloride: 98 mmol/L — ABNORMAL LOW (ref 101–111)
Creatinine, Ser: 3.51 mg/dL — ABNORMAL HIGH (ref 0.44–1.00)
GFR calc Af Amer: 13 mL/min — ABNORMAL LOW (ref >60)
GFR, EST NON AFRICAN AMERICAN: 11 mL/min — AB (ref >60)
Glucose, Bld: 125 mg/dL — ABNORMAL HIGH (ref 65–99)
POTASSIUM: 3.9 mmol/L (ref 3.5–5.1)
Phosphorus: 2.8 mg/dL (ref 2.5–4.6)
Sodium: 136 mmol/L (ref 135–145)

## 2015-02-24 LAB — HEPARIN LEVEL (UNFRACTIONATED)
HEPARIN UNFRACTIONATED: 0.68 [IU]/mL (ref 0.30–0.70)
HEPARIN UNFRACTIONATED: 0.38 [IU]/mL (ref 0.30–0.70)
Heparin Unfractionated: 0.41 IU/mL (ref 0.30–0.70)

## 2015-02-24 MED ORDER — WARFARIN SODIUM 7.5 MG PO TABS
7.5000 mg | ORAL_TABLET | Freq: Once | ORAL | Status: AC
  Administered 2015-02-24: 7.5 mg via ORAL
  Filled 2015-02-24: qty 1

## 2015-02-24 MED ORDER — HEPARIN (PORCINE) IN NACL 100-0.45 UNIT/ML-% IJ SOLN
1200.0000 [IU]/h | INTRAMUSCULAR | Status: DC
  Administered 2015-02-24: 1200 [IU]/h via INTRAVENOUS
  Filled 2015-02-24: qty 250

## 2015-02-24 MED ORDER — DARBEPOETIN ALFA 150 MCG/0.3ML IJ SOSY
PREFILLED_SYRINGE | INTRAMUSCULAR | Status: AC
  Administered 2015-02-24: 150 ug
  Filled 2015-02-24: qty 0.3

## 2015-02-24 NOTE — Progress Notes (Signed)
Called into room by family, pt expressing desire to stop dialysis and return home with home hospice services, family supportive, Dr Burman Foster notified.

## 2015-02-24 NOTE — Progress Notes (Signed)
[facility] Progress Note  Assessment/Plan: 1. New acute PE per CT angio 6/25; likely source of chest pain and cough - CXR suggesting of R mid to lower lung zone effusion vs atx, on home O2 and nebs, cards following; heparin/coumadin started 2. ESRD - TTS - K up to 3.9 after 4 K bath - leaving below EDW with very low gains and poor intake - needs lower edw at d/c;  K on admission may have been lower than usual because she had two HD treatments back to back due to declot of [place] earlier this week 3. Hypertension/volume - decrease volume - not on antihypertensive meds - states BP is up and down at outpt HD; pre HD BP increasing - 150 - 180s with usual HR 90 - 100s; HR noted to be more elevated the last two treatment in the 110s pre HD; post HD BP 170 - 190 with pt being below EDW pre HD with post HD weight of 67.8 6/23- gently lower edw; 6/25 net UF 1 L with post weight 66.6- titrate EDW down while here for BP control - likely losing weight due to gastric cancer and poor intake 4. Anemia - Hgb trending down from outpt - stable at 9.2 - Mircera increased from 100 to 150 on 6/14, add weekly Fe, redosed Aranesp 150 6/25 5. Metabolic bone disease - Takes phoslo 1 daily although med list states BID - leave at BID for now; continue calcitriol 2 TIW 6. Nutrition - renal diet +vitamin, added supplements - plan change to regular diet to help with intake 7. 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 Anabel Bene- likely explains her poor intake; likely losing weight 8. DNR 9. Long term disposition - pt tells me today that with all her medical issues and less than optimal quality of life she is considering stopping dialysis - she has not talked to her daughter about this and wants me to speak with her - I have given her my phone # - they will need to talk this out.  Myriam Jacobson, PA-C [place] [phone] 02/24/2015,10:06 AM   I have seen and examined this patient  and agree with plan and assessment in the above note with the highlighted addition. Anticipate that daughter will be calling me today to discuss Annette Roach's consideration of discontinuing dialysis. In the meantime continue current treatments including heparin for new PE.  Jamal Maes, MD [place] Pager 02/24/2015, 10:40 AM    Subjective:    "Just not a good day" Feels bad all over No appetite Chest with some discomfort Can't walk more than a few feet without SOB Describes overall diminished QOL  Objective Filed Vitals:   02/24/15 0130 02/24/15 0200 02/24/15 0245 02/24/15 0316  BP: 158/73 162/83 162/83 150/63  Pulse: 102 101 102   Temp:   98.9 F (37.2 C) 97.5 F (36.4 C)  TempSrc:   Oral Oral  Resp:   33 20  Height:      Weight:   66.6 kg (146 lb 13.2 oz)   SpO2:   100% 100%   Physical Exam General: Depressed appearing Heart:S1S2 No S3  Lungs: Anteriorly fairly clear Abdomen: No tenderness Extremities: No edema Dialysis Access: Right arm AVG + bruit  Dialysis Orders: Center: [facility] TTS 3.5 hr 400/A 1.5 EDW 68.5 2 K 2 Ca right upper AVGG heaprin 5000 Mircera 150q 2 weeks last given 6/14 no Fe last venofer 50 6/2 calcitriol 2 TIW Recent labs:  Hgb 9.6 29% sat 6/23 ferritin 601 1/28 iPTH 808 6/23 increasing  Additional Objective Labs: Basic Metabolic Panel:  Recent Labs Lab 02/23/15 0920 02/23/15 1500 02/24/15 0558  NA 138  --  136  K 3.3*  --  3.9  CL 96*  --  98*  CO2 28  --  30  GLUCOSE 130*  --  125*  BUN 15  --  <5*  CREATININE 6.60*  --  3.51*  CALCIUM 9.0  --  8.7*  PHOS  --  4.9* 2.8   Liver Function Tests:  Recent Labs Lab 02/23/15 0920 02/24/15 0558  AST 19  --   ALT 8*  --   ALKPHOS 78  --   BILITOT 0.4  --   PROT 7.8  --   ALBUMIN 3.0* 2.8*   CBC:  Recent Labs Lab 02/23/15 0920 02/24/15 0558  WBC 4.5 4.6  NEUTROABS 2.9  --   HGB 9.2* 9.2*  HCT 29.3* 29.4*  MCV 84.7 84.5  PLT 219 177  Cardiac  Enzymes:  Recent Labs Lab 02/23/15 1500 02/23/15 2100  TROPONINI 0.12* 0.10*   CBG:  Recent Labs Lab 02/23/15 1356 02/23/15 1656 02/23/15 2101 02/24/15 0740  GLUCAP 177* 98 123* 121*   Lab Results  Component Value Date   INR 1.24 02/24/2015   INR 1.23 12/25/2014   INR 1.11 10/27/2014  I Studies/Results: Dg Chest 2 View  02/23/2015   CLINICAL DATA:  Midsternal chest pain since this morning with cough since yesterday and shortness-of-breath one year.  EXAM: CHEST  2 VIEW  COMPARISON:  01/31/2015  FINDINGS: Lungs are somewhat hypoinflated with no opacification over the right base likely right-sided effusion with atelectasis as cannot exclude infection in the right mid to lower lung. Minimal linear atelectasis over the left midlung. Stable cardiomegaly. Calcified plaque over the thoracoabdominal aorta. There are degenerate changes of the spine. Stent over the right upper arm unchanged.  IMPRESSION: New opacification over the right mid to lower lung likely effusion with atelectasis. Cannot exclude infection in the right mid to lower lung.  Stable cardiomegaly.   Electronically Signed   By: Marin Olp M.D.   On: 02/23/2015 09:50   Ct Angio Chest Pe W/cm &/or Wo Cm  02/23/2015   CLINICAL DATA:  Cough, chest pain, elevated D-dimer.  EXAM: CT ANGIOGRAPHY CHEST WITH CONTRAST  TECHNIQUE: Multidetector CT imaging of the chest was performed using the standard protocol during bolus administration of intravenous contrast. Multiplanar CT image reconstructions and MIPs were obtained to evaluate the vascular anatomy.  CONTRAST:  62mL OMNIPAQUE IOHEXOL 350 MG/ML SOLN  COMPARISON:  CT chest dated 12/27/2014  FINDINGS: Lobar/segmental pulmonary emboli within branches of the left upper and lower lobe pulmonary arteries (series 5/images 101 and 108).  Overall clot burden is small to moderate.  Elevated elevated RV to LV ratio of 1.1, at least raising the possibility of right heart strain.   Mediastinum/Nodes: Cardiomegaly.  No pericardial effusion.  Coronary atherosclerosis.  Atherosclerotic calcifications of the aortic arch.  Small mediastinal lymph nodes which do not meet pathologic CT size criteria.  Visualized thyroid is notable for coarse calcification in the right gland.  Lungs/Pleura: Patchy right lower lobe opacity, likely compressive atelectasis. Moderate right and small left pleural effusions.  Scattered atelectasis in the posterior upper lobes and left lower lobe.  6 mm nodular opacity in the posterior left upper lobe (series 4/image 24), grossly unchanged.  No pneumothorax.  Upper abdomen: Visualized upper abdomen is notable  for hepatic and splenic granulomata as well as vascular calcifications.  Musculoskeletal: Degenerative changes of the visualized thoracolumbar spine.  Superior endplate compression fracture deformities at L1 and L2.  Review of the MIP images confirms the above findings.  IMPRESSION: Lobar/segmental pulmonary emboli within branches of the left upper and lower lobe pulmonary arteries. Overall clot burden is small to moderate. Elevated RV to LV ratio, at least raising the possibility of right heart strain.  Positive for acute PE with CT evidence of right heart strain (RV/LV Ratio = 1.1) consistent with at least submassive (intermediate risk) PE. The presence of right heart strain has been associated with an increased risk of morbidity and mortality. Please activate Code PE by paging [phone].  Critical Value/emergent results were called by telephone at the time of interpretation on 02/23/2015 at 5:38 pm to Dr. Laurance Flatten, who verbally acknowledged these results.   Electronically Signed   By: Julian Hy M.D.   On: 02/23/2015 17:44   Medications: . heparin 1,100 Units/hr (02/23/15 1812)   . aspirin EC  81 mg Oral Daily  . aspirin  325 mg Oral Once  . azelastine  1 spray Each Nare Daily  . benzonatate  100 mg Oral BID  . budesonide  2 mL Inhalation BID   . [START ON 02/26/2015] calcitRIOL  2 mcg Oral Q T,Th,Sa-HD  . calcium acetate  667 mg Oral BID WC  . darbepoetin (ARANESP) injection - DIALYSIS  150 mcg Intravenous Q Sat-HD  . feeding supplement (NEPRO CARB STEADY)  237 mL Oral BID BM  . ferric gluconate (FERRLECIT/NULECIT) IV  125 mg Intravenous Q Sat-HD  . hydrALAZINE  10 mg Oral BID  . imatinib  100 mg Oral Q24H  . insulin aspart  0-9 Units Subcutaneous TID WC  . loratadine  10 mg Oral Daily  . multivitamin  1 tablet Oral QHS  . pantoprazole  40 mg Oral Daily  . sodium chloride  3 mL Intravenous Q12H  . warfarin  7.5 mg Oral ONCE-1800  . Warfarin - Pharmacist Dosing Inpatient   Does not apply [phone]

## 2015-02-24 NOTE — Progress Notes (Signed)
Subjective: Patient is worried about her health condition with new PE and her heart. Does not have any chest pain currently. Vitals are stable overnight. She is tired as she did not get back from HD until late night yesterday.    Objective: Vital signs in last 24 hours: Filed Vitals:   02/24/15 0130 02/24/15 0200 02/24/15 0245 02/24/15 0316  BP: 158/73 162/83 162/83 150/63  Pulse: 102 101 102   Temp:   98.9 F (37.2 C) 97.5 F (36.4 C)  TempSrc:   Oral Oral  Resp:   33 20  Height:      Weight:   146 lb 13.2 oz (66.6 kg)   SpO2:   100% 100%   Weight change:   Intake/Output Summary (Last 24 hours) at 02/24/15 1205 Last data filed at 02/24/15 0245  Gross per 24 hour  Intake      0 ml  Output   1000 ml  Net  -1000 ml   Vitals reviewed. General: elderly female, sitting in bed, NAD HEENT: PERRL, EOMI, no scleral icterus Cardiac: tachycardic, no rubs, murmurs or gallops Pulm: clear to auscultation bilaterally, no wheezes, rales, or rhonchi Abd: soft, nontender, nondistended, BS present Ext: warm and well perfused, no pedal edema Neuro: alert and oriented X3  Lab Results: Basic Metabolic Panel:  Recent Labs Lab 02/23/15 0920 02/23/15 1500 02/24/15 0558  NA 138  --  136  K 3.3*  --  3.9  CL 96*  --  98*  CO2 28  --  30  GLUCOSE 130*  --  125*  BUN 15  --  <5*  CREATININE 6.60*  --  3.51*  CALCIUM 9.0  --  8.7*  MG  --  2.1  --   PHOS  --  4.9* 2.8   Liver Function Tests:  Recent Labs Lab 02/23/15 0920 02/24/15 0558  AST 19  --   ALT 8*  --   ALKPHOS 78  --   BILITOT 0.4  --   PROT 7.8  --   ALBUMIN 3.0* 2.8*  CBC:  Recent Labs Lab 02/23/15 0920 02/24/15 0558  WBC 4.5 4.6  NEUTROABS 2.9  --   HGB 9.2* 9.2*  HCT 29.3* 29.4*  MCV 84.7 84.5  PLT 219 177   Cardiac Enzymes:  Recent Labs Lab 02/23/15 1500 02/23/15 2100  TROPONINI 0.12* 0.10*   BNP: No results for input(s): PROBNP in the last 168 hours. D-Dimer:  Recent Labs Lab  02/23/15 1500  DDIMER 3.33*   CBG:  Recent Labs Lab 02/23/15 1356 02/23/15 1656 02/23/15 2101 02/24/15 0740 02/24/15 1155  GLUCAP 177* 98 123* 121* 133*   Thyroid Function Tests:  Recent Labs Lab 02/23/15 1500  TSH 3.431   Coagulation:  Recent Labs Lab 02/24/15 0558  LABPROT 15.7*  INR 1.24   Anemia Panel:  Recent Labs Lab 02/23/15 1500  VITAMINB12 1203*  FOLATE 38.0  RETICCTPCT 3.3*   Urine Drug Screen: Drugs of Abuse     Component Value Date/Time   LABOPIA NONE DETECTED 12/25/2014 1517   COCAINSCRNUR NONE DETECTED 12/25/2014 1517   LABBENZ NONE DETECTED 12/25/2014 1517   AMPHETMU NONE DETECTED 12/25/2014 1517   THCU NONE DETECTED 12/25/2014 1517   LABBARB NONE DETECTED 12/25/2014 1517    Alcohol Level: No results for input(s): ETH in the last 168 hours. Urinalysis: No results for input(s): COLORURINE, LABSPEC, PHURINE, GLUCOSEU, HGBUR, BILIRUBINUR, KETONESUR, PROTEINUR, UROBILINOGEN, NITRITE, LEUKOCYTESUR in the last 168 hours.  Invalid input(s): APPERANCEUR  Misc. Labs:  Micro Results: No results found for this or any previous visit (from the past 240 hour(s)). Studies/Results: Dg Chest 2 View  02/23/2015   CLINICAL DATA:  Midsternal chest pain since this morning with cough since yesterday and shortness-of-breath one year.  EXAM: CHEST  2 VIEW  COMPARISON:  01/31/2015  FINDINGS: Lungs are somewhat hypoinflated with no opacification over the right base likely right-sided effusion with atelectasis as cannot exclude infection in the right mid to lower lung. Minimal linear atelectasis over the left midlung. Stable cardiomegaly. Calcified plaque over the thoracoabdominal aorta. There are degenerate changes of the spine. Stent over the right upper arm unchanged.  IMPRESSION: New opacification over the right mid to lower lung likely effusion with atelectasis. Cannot exclude infection in the right mid to lower lung.  Stable cardiomegaly.   Electronically Signed    By: Marin Olp M.D.   On: 02/23/2015 09:50   Ct Angio Chest Pe W/cm &/or Wo Cm  02/23/2015   CLINICAL DATA:  Cough, chest pain, elevated D-dimer.  EXAM: CT ANGIOGRAPHY CHEST WITH CONTRAST  TECHNIQUE: Multidetector CT imaging of the chest was performed using the standard protocol during bolus administration of intravenous contrast. Multiplanar CT image reconstructions and MIPs were obtained to evaluate the vascular anatomy.  CONTRAST:  76mL OMNIPAQUE IOHEXOL 350 MG/ML SOLN  COMPARISON:  CT chest dated 12/27/2014  FINDINGS: Lobar/segmental pulmonary emboli within branches of the left upper and lower lobe pulmonary arteries (series 5/images 101 and 108).  Overall clot burden is small to moderate.  Elevated elevated RV to LV ratio of 1.1, at least raising the possibility of right heart strain.  Mediastinum/Nodes: Cardiomegaly.  No pericardial effusion.  Coronary atherosclerosis.  Atherosclerotic calcifications of the aortic arch.  Small mediastinal lymph nodes which do not meet pathologic CT size criteria.  Visualized thyroid is notable for coarse calcification in the right gland.  Lungs/Pleura: Patchy right lower lobe opacity, likely compressive atelectasis. Moderate right and small left pleural effusions.  Scattered atelectasis in the posterior upper lobes and left lower lobe.  6 mm nodular opacity in the posterior left upper lobe (series 4/image 24), grossly unchanged.  No pneumothorax.  Upper abdomen: Visualized upper abdomen is notable for hepatic and splenic granulomata as well as vascular calcifications.  Musculoskeletal: Degenerative changes of the visualized thoracolumbar spine.  Superior endplate compression fracture deformities at L1 and L2.  Review of the MIP images confirms the above findings.  IMPRESSION: Lobar/segmental pulmonary emboli within branches of the left upper and lower lobe pulmonary arteries. Overall clot burden is small to moderate. Elevated RV to LV ratio, at least raising the  possibility of right heart strain.  Positive for acute PE with CT evidence of right heart strain (RV/LV Ratio = 1.1) consistent with at least submassive (intermediate risk) PE. The presence of right heart strain has been associated with an increased risk of morbidity and mortality. Please activate Code PE by paging [phone].  Critical Value/emergent results were called by telephone at the time of interpretation on 02/23/2015 at 5:38 pm to Dr. Laurance Flatten, who verbally acknowledged these results.   Electronically Signed   By: Julian Hy M.D.   On: 02/23/2015 17:44   Medications: I have reviewed the patient's current medications. Scheduled Meds: . aspirin EC  81 mg Oral Daily  . azelastine  1 spray Each Nare Daily  . benzonatate  100 mg Oral BID  . budesonide  2 mL Inhalation BID  . [START ON 02/26/2015] calcitRIOL  2 mcg Oral Q T,Th,Sa-HD  . calcium acetate  667 mg Oral BID WC  . darbepoetin (ARANESP) injection - DIALYSIS  150 mcg Intravenous Q Sat-HD  . feeding supplement (NEPRO CARB STEADY)  237 mL Oral BID BM  . ferric gluconate (FERRLECIT/NULECIT) IV  125 mg Intravenous Q Sat-HD  . hydrALAZINE  10 mg Oral BID  . imatinib  100 mg Oral Q24H  . insulin aspart  0-9 Units Subcutaneous TID WC  . loratadine  10 mg Oral Daily  . multivitamin  1 tablet Oral QHS  . pantoprazole  40 mg Oral Daily  . sodium chloride  3 mL Intravenous Q12H  . warfarin  7.5 mg Oral ONCE-1800  . Warfarin - Pharmacist Dosing Inpatient   Does not apply q1800   Continuous Infusions: . heparin 1,100 Units/hr (02/23/15 1812)   PRN Meds:.acetaminophen, albuterol, docusate sodium, polyethylene glycol, traMADol Assessment/Plan: Principal Problem:   Acute pulmonary embolism Active Problems:   Hypertension   CAD (coronary artery disease)   SOB (shortness of breath)   Chest pain   Elevated troponin   Cough  Acute left lobar PE - small to moderate This is likely the cause of her chest pain which she  presented with from yesterday. 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 - echo pending to evaluate for RH strain. Poor candidate for thrombolytic, and stable vitals currenlty. - continue heparin drip today + coumadin for now. Will discuss with Dr. Beryle Beams (hematology) about long term anticoag that would be best for her. May need to either be on lovenox (may inject herself or home RN) or coumadin with regular follow up at cardiology for INR check - likely needs life time anticoag - monitor vitals closely. F/up ECHO results  Elevated troponin with hx of CAD did have CP but that's likely from PE. No EKG changes. Mild trop elevation is likely from her ESRD + acute PE. - appreciate card recs: consider elective outpatient stress test when more stable. - cont asa 81. cont nitro prn.   ESRD on T, Th, Sat HD- had HD 6/25 night - calcitriol, phoslo rena-Vit, weekly FE, aranesp,  - patient is considering stopping HD per nephro note, she will be talking to her daughter about it. As of now, continue HD as scheduled.  Cough - started 3 days ago, non productive. No SOB.  - could be from Acute PE, or her COPD or could also be a side effect of Gleevec, or atelectasis as seen on CXR and CTangio - ISS for atelectasis - tessalon for cough  RBBB - with Intraventricular Conduction delay - likely age related - no indication for urgent pacing currently per cards.  - follow up with primary cards about this - they may have discussed PPM in the past.   HTN - at home on hydral 10 BID, cont here.  - can titrate up hydral for BP control - avoid bblocker given conduction delay  CHF - EF 46-50%, diastolic function could not assessed  - appears euvolemic now. F/up new echo results.  Chronic normocytic anemia 2/2 to CKD - stable around 9-10. 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 Anabel Bene @[place]  not surgically  resectable per EMR, supposed to be on Novi for lifetime (it's cytostatic, not cytocidal) - cont gleevec.   Dispo: Disposition is deferred at this time, awaiting improvement of current medical problems.  Anticipated discharge in  approximately 1-2 day(s).   The patient  Has a current PCP Raina Mina, MD) and does need an [facility] hospital follow-up appointment after discharge.  The patient does not know have transportation limitations that hinder transportation to clinic appointments.  .Services Needed at time of discharge: Y = Yes, Blank = No PT:   OT:   RN:   Equipment:   Other:       Dellia Nims, MD 02/24/2015, 12:05 PM

## 2015-02-24 NOTE — Consult Note (Addendum)
[place] for Heparin and Coumadin Indication: pulmonary embolus  No Known Allergies  Patient Measurements: Height: 5\' 2"  (157.5 cm) Weight: 146 lb 13.2 oz (66.6 kg) IBW/kg (Calculated) : 50.1 Heparin Dosing Weight: 65 kg  Vital Signs: Temp: 97.5 F (36.4 C) (06/26 0316) Temp Source: Oral (06/26 0316) BP: 150/63 mmHg (06/26 0316) Pulse Rate: 102 (06/26 0245)  Labs:  Recent Labs  02/23/15 0920 02/23/15 1500 02/23/15 2100 02/24/15 0558  HGB 9.2*  --   --  9.2*  HCT 29.3*  --   --  29.4*  PLT 219  --   --  177  LABPROT  --   --   --  15.7*  INR  --   --   --  1.24  HEPARINUNFRC  --   --   --  0.68  CREATININE 6.60*  --   --  3.51*  TROPONINI  --  0.12* 0.10*  --     Estimated Creatinine Clearance: 10.5 mL/min (by C-G formula based on Cr of 3.51).  Assessment: 81 y.o. female with PE for anticoagulation Goal of Therapy:  Heparin level 0.3-0.7 units/ml Monitor platelets by anticoagulation protocol: Yes   Plan:  Continue Heparin at current rate for now Recheck level in 8 hrs to verify Coumadin 7.5 mg today  Abbott, Bronson Curb 02/24/2015,6:44 AM  ADDENDUM: - Repeat HL remains within goal range but has trended down significantly from 0.68 to 0.38 (no interruptions in gtt per nurse) - Increase heparin gtt to 1200 units/hr to maintain in goal range - Recheck HL in 8 hours  - Daily HL/CBC - Monitor for s/s bleeding  Gregrey Bloyd K. Velva Harman, PharmD, [place] Clinical Pharmacist - Resident Pager: [phone] Pharmacy: [phone] 02/24/2015 3:08 PM

## 2015-02-24 NOTE — Progress Notes (Signed)
I was asked by RN to come talk to the patient as she has expressed she does not want any more HD and wants hospice care.  I went to talk to the family. Patient is currently feeling very sad and hopeless about her health issues. She thinks dialysis is not helping her anymore. I explained that the pulmonary embolism is likely from her GIST, dialysis would not have prevented it. She is currently on anticoag for PE.  Patient does have mild confusion currenlty, could have some delirium 2/2 to being in the hospital and disrupted sleep pattern. I asked her to take some rest and that we will re-visit this again tomorrow.   Daughter requested information about hospice and other options for her. I have requested Palliative Dr. Deitra Mayo for his recommendation.  Appreciate recs.  Patient is already DNR.   Revisit GOC tomorrow when patient is hopefully more mentally clear.

## 2015-02-24 NOTE — Progress Notes (Signed)
  Echocardiogram 2D Echocardiogram has been performed.  Bobbye Charleston 02/24/2015, 9:43 AM

## 2015-02-24 NOTE — Progress Notes (Signed)
  Date: 02/24/2015  Patient name: CALINA PATRIE  Medical record number: [id number]  Date of birth: 06-27-1928   I have seen and evaluated Amere E Zuniga and discussed their care with the Residency Team. Briefly, Ms. Hilligoss is an 81yo woman with PMH of CAD, CHF (45-50%), COPD, DM2, HTN, HLD and h/o gastrointestinal stromal tumor on Gleevec who presents with chest pain at rest.  Also complains of dry cough X 3 days, with no other symptoms of SOB or URI.  She further has ESRD and is on HD.  D dimer was elevated and CT chest showed a PE on the right.  She also has an unexplained pleural effusion on the left.  Due to chest pain, cardiology was consulted in the ED.   On exam, she was comfortable, tachycardic.  She is was in NAD.  She had a RR, NR, no murmur, no rub noted.  Breathing unlabored, she had decreased breath sounds left base.  Abdomen +BS.  No swelling in LE or pain to palpation.    Blood work showed an elevated Cr in the setting of CKD.  CBC showed normocytic anemia.  Troponin was mildly elevated to 0.10.  D dimer was 3.33.    Assessment and Plan: I have seen and evaluated the patient as outlined above. I agree with the formulated Assessment and Plan as detailed in the residents' admission note, with the following changes:   1. Chest pain, substernal, left sided pleural effusion - CE X 3 - Cardiology following - TTE pending - Aspirin as noted - AM EKG - Pe treatment as below  2. Pulmonary embolism - Heparin gtt - Started coumadin - Given history of cancer, ? Whether lovenox would be a good choice for her long term  3. ESRD - Nephrology consult for HD  4. Chronic combined CHF - Continue hydralazine - no BB given h/o bradycardia  - TTE  Other issues per resident note.   Sid Falcon, MD 6/26/20168:31 AM

## 2015-02-24 NOTE — Progress Notes (Signed)
SUBJECTIVE: The patient is doing ok today.  No chest pain presently.  SOB is stable.  She is worried about new diagnosis of pte.   Marland Kitchen aspirin EC  81 mg Oral Daily  . azelastine  1 spray Each Nare Daily  . benzonatate  100 mg Oral BID  . budesonide  2 mL Inhalation BID  . [START ON 02/26/2015] calcitRIOL  2 mcg Oral Q T,Th,Sa-HD  . calcium acetate  667 mg Oral BID WC  . darbepoetin (ARANESP) injection - DIALYSIS  150 mcg Intravenous Q Sat-HD  . feeding supplement (NEPRO CARB STEADY)  237 mL Oral BID BM  . ferric gluconate (FERRLECIT/NULECIT) IV  125 mg Intravenous Q Sat-HD  . hydrALAZINE  10 mg Oral BID  . imatinib  100 mg Oral Q24H  . insulin aspart  0-9 Units Subcutaneous TID WC  . loratadine  10 mg Oral Daily  . multivitamin  1 tablet Oral QHS  . pantoprazole  40 mg Oral Daily  . sodium chloride  3 mL Intravenous Q12H  . warfarin  7.5 mg Oral ONCE-1800  . Warfarin - Pharmacist Dosing Inpatient   Does not apply q1800   . heparin 1,100 Units/hr (02/23/15 1812)    OBJECTIVE: Physical Exam: Filed Vitals:   02/24/15 0130 02/24/15 0200 02/24/15 0245 02/24/15 0316  BP: 158/73 162/83 162/83 150/63  Pulse: 102 101 102   Temp:   98.9 F (37.2 C) 97.5 F (36.4 C)  TempSrc:   Oral Oral  Resp:   33 20  Height:      Weight:   66.6 kg (146 lb 13.2 oz)   SpO2:   100% 100%    Intake/Output Summary (Last 24 hours) at 02/24/15 1042 Last data filed at 02/24/15 0245  Gross per 24 hour  Intake      0 ml  Output   1000 ml  Net  -1000 ml    Telemetry reveals sinus tachycardia   GEN- The patient is well appearing, alert and oriented x 3 today.   Head- normocephalic, atraumatic Eyes-  Sclera clear, conjunctiva pink Ears- hearing intact Oropharynx- clear Neck- supple,   Lungs- Clear to ausculation bilaterally, normal work of breathing Heart- tachycardic regular rhythm, + murmur at the apex (likely systolic--> difficult to tell due to tachycardia and vocal noises of pt).  +S3 GI-  soft, NT, ND, + BS Extremities- no clubbing, cyanosis, or edema Skin- no rash or lesion Psych- euthymic mood, full affect Neuro- strength and sensation are intact  LABS: Basic Metabolic Panel:  Recent Labs  02/23/15 0920 02/23/15 1500 02/24/15 0558  NA 138  --  136  K 3.3*  --  3.9  CL 96*  --  98*  CO2 28  --  30  GLUCOSE 130*  --  125*  BUN 15  --  <5*  CREATININE 6.60*  --  3.51*  CALCIUM 9.0  --  8.7*  MG  --  2.1  --   PHOS  --  4.9* 2.8   Liver Function Tests:  Recent Labs  02/23/15 0920 02/24/15 0558  AST 19  --   ALT 8*  --   ALKPHOS 78  --   BILITOT 0.4  --   PROT 7.8  --   ALBUMIN 3.0* 2.8*   No results for input(s): LIPASE, AMYLASE in the last 72 hours. CBC:  Recent Labs  02/23/15 0920 02/24/15 0558  WBC 4.5 4.6  NEUTROABS 2.9  --   HGB 9.2*  9.2*  HCT 29.3* 29.4*  MCV 84.7 84.5  PLT 219 177   Cardiac Enzymes:  Recent Labs  02/23/15 1500 02/23/15 2100  TROPONINI 0.12* 0.10*   BNP: Invalid input(s): POCBNP D-Dimer:  Recent Labs  02/23/15 1500  DDIMER 3.33*   Hemoglobin A1C: No results for input(s): HGBA1C in the last 72 hours. Fasting Lipid Panel: No results for input(s): CHOL, HDL, LDLCALC, TRIG, CHOLHDL, LDLDIRECT in the last 72 hours. Thyroid Function Tests:  Recent Labs  02/23/15 1500  TSH 3.431   Anemia Panel:  Recent Labs  02/23/15 1500  VITAMINB12 1203*  FOLATE 38.0  RETICCTPCT 3.3*    RADIOLOGY: Dg Chest 2 View  02/23/2015   CLINICAL DATA:  Midsternal chest pain since this morning with cough since yesterday and shortness-of-breath one year.  EXAM: CHEST  2 VIEW  COMPARISON:  01/31/2015  FINDINGS: Lungs are somewhat hypoinflated with no opacification over the right base likely right-sided effusion with atelectasis as cannot exclude infection in the right mid to lower lung. Minimal linear atelectasis over the left midlung. Stable cardiomegaly. Calcified plaque over the thoracoabdominal aorta. There are  degenerate changes of the spine. Stent over the right upper arm unchanged.  IMPRESSION: New opacification over the right mid to lower lung likely effusion with atelectasis. Cannot exclude infection in the right mid to lower lung.  Stable cardiomegaly.   Electronically Signed   By: Marin Olp M.D.   On: 02/23/2015 09:50   Ct Angio Chest Pe W/cm &/or Wo Cm  02/23/2015   CLINICAL DATA:  Cough, chest pain, elevated D-dimer.  EXAM: CT ANGIOGRAPHY CHEST WITH CONTRAST  TECHNIQUE: Multidetector CT imaging of the chest was performed using the standard protocol during bolus administration of intravenous contrast. Multiplanar CT image reconstructions and MIPs were obtained to evaluate the vascular anatomy.  CONTRAST:  72mL OMNIPAQUE IOHEXOL 350 MG/ML SOLN  COMPARISON:  CT chest dated 12/27/2014  FINDINGS: Lobar/segmental pulmonary emboli within branches of the left upper and lower lobe pulmonary arteries (series 5/images 101 and 108).  Overall clot burden is small to moderate.  Elevated elevated RV to LV ratio of 1.1, at least raising the possibility of right heart strain.  Mediastinum/Nodes: Cardiomegaly.  No pericardial effusion.  Coronary atherosclerosis.  Atherosclerotic calcifications of the aortic arch.  Small mediastinal lymph nodes which do not meet pathologic CT size criteria.  Visualized thyroid is notable for coarse calcification in the right gland.  Lungs/Pleura: Patchy right lower lobe opacity, likely compressive atelectasis. Moderate right and small left pleural effusions.  Scattered atelectasis in the posterior upper lobes and left lower lobe.  6 mm nodular opacity in the posterior left upper lobe (series 4/image 24), grossly unchanged.  No pneumothorax.  Upper abdomen: Visualized upper abdomen is notable for hepatic and splenic granulomata as well as vascular calcifications.  Musculoskeletal: Degenerative changes of the visualized thoracolumbar spine.  Superior endplate compression fracture deformities at  L1 and L2.  Review of the MIP images confirms the above findings.  IMPRESSION: Lobar/segmental pulmonary emboli within branches of the left upper and lower lobe pulmonary arteries. Overall clot burden is small to moderate. Elevated RV to LV ratio, at least raising the possibility of right heart strain.  Positive for acute PE with CT evidence of right heart strain (RV/LV Ratio = 1.1) consistent with at least submassive (intermediate risk) PE. The presence of right heart strain has been associated with an increased risk of morbidity and mortality. Please activate Code PE by paging [phone].  Critical  Value/emergent results were called by telephone at the time of interpretation on 02/23/2015 at 5:38 pm to Dr. Laurance Flatten, who verbally acknowledged these results.   Electronically Signed   By: Julian Hy M.D.   On: 02/23/2015 17:44    ASSESSMENT AND PLAN:  Principal Problem:   Acute pulmonary embolism Active Problems:   Hypertension   CAD (coronary artery disease)   SOB (shortness of breath)   Chest pain   Elevated troponin   Cough 1. Chest pain syndrome--> likely due to pte On heparin drip Coumadin started Awaiting echo to evaluate for RH strain, though she is hemodynamically stable and would not be a very good lytic candidate Would observe closely  2. Elevated troponin Not an mi Due to pte Elective stress testing could be considered as an outpatient once more stable  3. RBBB Present on ekgs for several years She has an 79 year old conduction system but presently no indication for pacing urgently Follow-up with primary cardiologist Keep on telemetry while here  4. HTN - borderline controlled - increase Hydralazine as needed for control. Avoid BB given conduction system disease  5. Chronic combined systolic/diastolic CHF (EF 43-88%)  Echo is pending   Thompson Grayer, MD 02/24/2015 10:42 AM

## 2015-02-24 NOTE — Consult Note (Signed)
Asked by teaching service to discuss with patient/family different options for hospice care.  Patient is 81 yo female with ESRD on HD, GIST, systolic CHF, COPD admitted with chest pain and subsequently discovered to have PE. Today she made request to stop hemodialysis. I met today with her and 2 children and spoke to another daughter over phone via conference call. While she does have confusion which family notes as well, she is able to tell us that she wants to stop dialysis because it makes her feel "weak and sick".  Family also states that this is not the first time she has said this and has been talking about this recently at home, especially with her home health aides. We talked about what to expect prognostically and from a symptom point with stopping dialysis in general.  Most often prognosis tends to be around 10-14 days.  This would make her eligible in most cases for residential hospice facility (though up to each individual hospice agency to approve).  We also talked about home hospice and what this generally entails.  Given her prognosis with stopping dialysis, GIST treatment and aggressive anticoagulation would also be stopped under hospice care.  Family understood this and were able to have general questions answered. They will talk again tomorrow and look for consistency in patients wishes tomorrow. They are understandably taking this statement/desire to stop dialysis hard but want to follow her wishes.  I will sign off, but if there are questions please call our team. If family elects to pursue home hospice care, this is setup by case management. If they elect residential hospice care this is setup through social work. If they would like more specific information about hospice, I would recommend case management reach out to hospice agency of their choosing to hear more specific information as each individual hospice is a little different.   Doran Clay D.O. Palliative Medicine Team at [facility]  Pager: [phone] Team Phone: [phone]

## 2015-02-24 NOTE — Progress Notes (Signed)
Pt having periods of confusion, unable to say where she is at, oriented at present, family at bedside.

## 2015-02-25 DIAGNOSIS — R0789 Other chest pain: Secondary | ICD-10-CM

## 2015-02-25 DIAGNOSIS — I2699 Other pulmonary embolism without acute cor pulmonale: Principal | ICD-10-CM

## 2015-02-25 LAB — HEMOGLOBIN A1C
HEMOGLOBIN A1C: 5.3 % (ref 4.8–5.6)
MEAN PLASMA GLUCOSE: 105 mg/dL

## 2015-02-25 LAB — CBC
HCT: 30.2 % — ABNORMAL LOW (ref 36.0–46.0)
Hemoglobin: 9.4 g/dL — ABNORMAL LOW (ref 12.0–15.0)
MCH: 26.9 pg (ref 26.0–34.0)
MCHC: 31.1 g/dL (ref 30.0–36.0)
MCV: 86.5 fL (ref 78.0–100.0)
Platelets: 204 10*3/uL (ref 150–400)
RBC: 3.49 MIL/uL — AB (ref 3.87–5.11)
RDW: 17.8 % — AB (ref 11.5–15.5)
WBC: 6.3 10*3/uL (ref 4.0–10.5)

## 2015-02-25 LAB — RENAL FUNCTION PANEL
ANION GAP: 11 (ref 5–15)
Albumin: 2.9 g/dL — ABNORMAL LOW (ref 3.5–5.0)
BUN: 10 mg/dL (ref 6–20)
CALCIUM: 9.7 mg/dL (ref 8.9–10.3)
CHLORIDE: 104 mmol/L (ref 101–111)
CO2: 23 mmol/L (ref 22–32)
Creatinine, Ser: 5.92 mg/dL — ABNORMAL HIGH (ref 0.44–1.00)
GFR calc Af Amer: 7 mL/min — ABNORMAL LOW (ref >60)
GFR calc non Af Amer: 6 mL/min — ABNORMAL LOW (ref >60)
GLUCOSE: 90 mg/dL (ref 65–99)
PHOSPHORUS: 4.4 mg/dL (ref 2.5–4.6)
Potassium: 4.6 mmol/L (ref 3.5–5.1)
Sodium: 138 mmol/L (ref 135–145)

## 2015-02-25 LAB — PROTIME-INR
INR: 1.16 (ref 0.00–1.49)
PROTHROMBIN TIME: 14.9 s (ref 11.6–15.2)

## 2015-02-25 LAB — GLUCOSE, CAPILLARY
GLUCOSE-CAPILLARY: 114 mg/dL — AB (ref 65–99)
Glucose-Capillary: 99 mg/dL (ref 65–99)
Glucose-Capillary: 119 mg/dL — ABNORMAL HIGH (ref 65–99)

## 2015-02-25 LAB — HEPARIN LEVEL (UNFRACTIONATED): Heparin Unfractionated: 0.36 IU/mL (ref 0.30–0.70)

## 2015-02-25 MED ORDER — ENOXAPARIN SODIUM 150 MG/ML ~~LOC~~ SOLN: 1.0000 mg/kg | SUBCUTANEOUS | Status: DC

## 2015-02-25 MED ORDER — ENOXAPARIN SODIUM 80 MG/0.8ML ~~LOC~~ SOLN
1.0000 mg/kg | SUBCUTANEOUS | Status: DC
  Administered 2015-02-25: 65 mg via SUBCUTANEOUS
  Filled 2015-02-25: qty 0.8

## 2015-02-25 MED ORDER — WARFARIN SODIUM 7.5 MG PO TABS
7.5000 mg | ORAL_TABLET | Freq: Once | ORAL | Status: AC
  Administered 2015-02-25: 7.5 mg via ORAL
  Filled 2015-02-25: qty 1

## 2015-02-25 MED ORDER — WARFARIN SODIUM 5 MG PO TABS: 7.5000 mg | ORAL_TABLET | Freq: Once | ORAL | Status: DC

## 2015-02-25 MED ORDER — BENZONATATE 100 MG PO CAPS: 100.0000 mg | ORAL_CAPSULE | Freq: Two times a day (BID) | ORAL | Status: DC

## 2015-02-25 MED ORDER — ENOXAPARIN SODIUM 80 MG/0.8ML ~~LOC~~ SOLN: 1.0000 mg/kg | SUBCUTANEOUS | Status: DC

## 2015-02-25 MED ORDER — ENOXAPARIN SODIUM 80 MG/0.8ML ~~LOC~~ SOLN: 1.0000 mg/kg | Freq: Two times a day (BID) | SUBCUTANEOUS | Status: DC

## 2015-02-25 NOTE — Progress Notes (Signed)
Subjective:  Patient had discussion with palliative team, expressed desire to stop HD as it makes her feel weak and sick. However, today she is well rested and less confused and is saying she hasn't made up her mind. She is agreeable to do everything for now.  Objective: Vital signs in last 24 hours: Filed Vitals:   02/24/15 1400 02/24/15 2000 02/24/15 2100 02/25/15 0500  BP: 159/71  164/69 159/59  Pulse: 99  97 95  Temp: 98.7 F (37.1 C)  97.5 F (36.4 C) 98.6 F (37 C)  TempSrc: Oral     Resp: 18  12 13   Height:      Weight:    145 lb 3.2 oz (65.862 kg)  SpO2: 99% 99% 100% 96%   Weight change: -5 lb 12.8 oz (-2.631 kg)  Intake/Output Summary (Last 24 hours) at 02/25/15 0733 Last data filed at 02/24/15 1744  Gross per 24 hour  Intake 261.25 ml  Output      0 ml  Net 261.25 ml   Vitals reviewed. General: elderly female, sitting in bed, NAD HEENT: PERRL, EOMI, no scleral icterus Cardiac: tachycardic, no rubs, murmurs or gallops Pulm: clear to auscultation bilaterally, no wheezes, rales, or rhonchi Abd: soft, nontender, nondistended, BS present Ext: warm and well perfused, no pedal edema Neuro: alert and oriented X 3   Lab Results: Basic Metabolic Panel:  Recent Labs Lab 02/23/15 0920 02/23/15 1500 02/24/15 0558  NA 138  --  136  K 3.3*  --  3.9  CL 96*  --  98*  CO2 28  --  30  GLUCOSE 130*  --  125*  BUN 15  --  <5*  CREATININE 6.60*  --  3.51*  CALCIUM 9.0  --  8.7*  MG  --  2.1  --   PHOS  --  4.9* 2.8   Liver Function Tests:  Recent Labs Lab 02/23/15 0920 02/24/15 0558  AST 19  --   ALT 8*  --   ALKPHOS 78  --   BILITOT 0.4  --   PROT 7.8  --   ALBUMIN 3.0* 2.8*  CBC:  Recent Labs Lab 02/23/15 0920 02/24/15 0558 02/25/15 0613  WBC 4.5 4.6 6.3  NEUTROABS 2.9  --   --   HGB 9.2* 9.2* 9.4*  HCT 29.3* 29.4* 30.2*  MCV 84.7 84.5 86.5  PLT 219 177 204   Cardiac Enzymes:  Recent Labs Lab 02/23/15 1500 02/23/15 2100  TROPONINI  0.12* 0.10*   BNP: No results for input(s): PROBNP in the last 168 hours. D-Dimer:  Recent Labs Lab 02/23/15 1500  DDIMER 3.33*   CBG:  Recent Labs Lab 02/23/15 1656 02/23/15 2101 02/24/15 0740 02/24/15 1155 02/24/15 1644 02/24/15 2103  GLUCAP 98 123* 121* 133* 124* 114*   Thyroid Function Tests:  Recent Labs Lab 02/23/15 1500  TSH 3.431   Coagulation:  Recent Labs Lab 02/24/15 0558 02/25/15 0613  LABPROT 15.7* 14.9  INR 1.24 1.16   Anemia Panel:  Recent Labs Lab 02/23/15 1500  VITAMINB12 1203*  FOLATE 38.0  RETICCTPCT 3.3*   Urine Drug Screen: Drugs of Abuse     Component Value Date/Time   LABOPIA NONE DETECTED 12/25/2014 1517   COCAINSCRNUR NONE DETECTED 12/25/2014 1517   LABBENZ NONE DETECTED 12/25/2014 1517   AMPHETMU NONE DETECTED 12/25/2014 1517   THCU NONE DETECTED 12/25/2014 1517   LABBARB NONE DETECTED 12/25/2014 1517    Alcohol Level: No results for input(s): ETH in  the last 168 hours. Urinalysis: No results for input(s): COLORURINE, LABSPEC, PHURINE, GLUCOSEU, HGBUR, BILIRUBINUR, KETONESUR, PROTEINUR, UROBILINOGEN, NITRITE, LEUKOCYTESUR in the last 168 hours.  Invalid input(s): APPERANCEUR Misc. Labs:  Micro Results: No results found for this or any previous visit (from the past 240 hour(s)). Studies/Results: Dg Chest 2 View  02/23/2015   CLINICAL DATA:  Midsternal chest pain since this morning with cough since yesterday and shortness-of-breath one year.  EXAM: CHEST  2 VIEW  COMPARISON:  01/31/2015  FINDINGS: Lungs are somewhat hypoinflated with no opacification over the right base likely right-sided effusion with atelectasis as cannot exclude infection in the right mid to lower lung. Minimal linear atelectasis over the left midlung. Stable cardiomegaly. Calcified plaque over the thoracoabdominal aorta. There are degenerate changes of the spine. Stent over the right upper arm unchanged.  IMPRESSION: New opacification over the right  mid to lower lung likely effusion with atelectasis. Cannot exclude infection in the right mid to lower lung.  Stable cardiomegaly.   Electronically Signed   By: Marin Olp M.D.   On: 02/23/2015 09:50   Ct Angio Chest Pe W/cm &/or Wo Cm  02/23/2015   CLINICAL DATA:  Cough, chest pain, elevated D-dimer.  EXAM: CT ANGIOGRAPHY CHEST WITH CONTRAST  TECHNIQUE: Multidetector CT imaging of the chest was performed using the standard protocol during bolus administration of intravenous contrast. Multiplanar CT image reconstructions and MIPs were obtained to evaluate the vascular anatomy.  CONTRAST:  37mL OMNIPAQUE IOHEXOL 350 MG/ML SOLN  COMPARISON:  CT chest dated 12/27/2014  FINDINGS: Lobar/segmental pulmonary emboli within branches of the left upper and lower lobe pulmonary arteries (series 5/images 101 and 108).  Overall clot burden is small to moderate.  Elevated elevated RV to LV ratio of 1.1, at least raising the possibility of right heart strain.  Mediastinum/Nodes: Cardiomegaly.  No pericardial effusion.  Coronary atherosclerosis.  Atherosclerotic calcifications of the aortic arch.  Small mediastinal lymph nodes which do not meet pathologic CT size criteria.  Visualized thyroid is notable for coarse calcification in the right gland.  Lungs/Pleura: Patchy right lower lobe opacity, likely compressive atelectasis. Moderate right and small left pleural effusions.  Scattered atelectasis in the posterior upper lobes and left lower lobe.  6 mm nodular opacity in the posterior left upper lobe (series 4/image 24), grossly unchanged.  No pneumothorax.  Upper abdomen: Visualized upper abdomen is notable for hepatic and splenic granulomata as well as vascular calcifications.  Musculoskeletal: Degenerative changes of the visualized thoracolumbar spine.  Superior endplate compression fracture deformities at L1 and L2.  Review of the MIP images confirms the above findings.  IMPRESSION: Lobar/segmental pulmonary emboli within  branches of the left upper and lower lobe pulmonary arteries. Overall clot burden is small to moderate. Elevated RV to LV ratio, at least raising the possibility of right heart strain.  Positive for acute PE with CT evidence of right heart strain (RV/LV Ratio = 1.1) consistent with at least submassive (intermediate risk) PE. The presence of right heart strain has been associated with an increased risk of morbidity and mortality. Please activate Code PE by paging [phone].  Critical Value/emergent results were called by telephone at the time of interpretation on 02/23/2015 at 5:38 pm to Dr. Laurance Flatten, who verbally acknowledged these results.   Electronically Signed   By: Julian Hy M.D.   On: 02/23/2015 17:44   Medications: I have reviewed the patient's current medications. Scheduled Meds: . aspirin EC  81 mg Oral Daily  .  azelastine  1 spray Each Nare Daily  . benzonatate  100 mg Oral BID  . budesonide  2 mL Inhalation BID  . [START ON 02/26/2015] calcitRIOL  2 mcg Oral Q T,Th,Sa-HD  . calcium acetate  667 mg Oral BID WC  . darbepoetin (ARANESP) injection - DIALYSIS  150 mcg Intravenous Q Sat-HD  . feeding supplement (NEPRO CARB STEADY)  237 mL Oral BID BM  . ferric gluconate (FERRLECIT/NULECIT) IV  125 mg Intravenous Q Sat-HD  . hydrALAZINE  10 mg Oral BID  . imatinib  100 mg Oral Q24H  . insulin aspart  0-9 Units Subcutaneous TID WC  . loratadine  10 mg Oral Daily  . multivitamin  1 tablet Oral QHS  . pantoprazole  40 mg Oral Daily  . sodium chloride  3 mL Intravenous Q12H  . Warfarin - Pharmacist Dosing Inpatient   Does not apply q1800   Continuous Infusions: . heparin 1,200 Units/hr (02/24/15 1749)   PRN Meds:.acetaminophen, albuterol, docusate sodium, polyethylene glycol, traMADol Assessment/Plan: Principal Problem:   Acute pulmonary embolism Active Problems:   Hypertension   CAD (coronary artery disease)   SOB (shortness of breath)   Chest pain   Elevated  troponin   Cough  Acute left lobar PE - small to moderate This is likely the cause of her chest pain which she presented with from yesterday. 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 - echo didn't show right heart strain, showed mod-severe MR and PULM HTN 69, EF 29-47%, and diastolic dysfunction. - switch from heparin gtt to lovenox 1mg /kg/24 hour + coumadin for now. Spoke with Dr. Beryle Beams, recommends coumadin over other anticoags for her.  - will bridge with lovenox at home. Asked Dr. Bea Graff her pcp to check INR at their clinic. He is agreeable, but he mentioned he will again recommend hospice for her given her GIST and risk of bleeding with blood thinners.  - likely needs life time anticoag unless patient decides hospice later.  - vitals stable. Will discharge today with close follow up with PCP.  Elevated troponin with hx of CAD did have CP but that's likely from PE. No EKG changes. Mild trop elevation is likely from her ESRD + acute PE.  Does have mildly reduced EF with inferior akinesis and MR which could reflect prior infarct. - appreciate card recs: medical management for now, consider elective outpatient stress test when more stable with her regular cardiologist.  - cont asa 81. cont nitro prn.   ESRD on T, Th, Sat HD- had HD 6/25 night - calcitriol, phoslo rena-Vit, weekly FE, aranesp,  - patient is considering stopping HD per nephro note, she will be talking to her daughter about it. As of now, continue HD as scheduled.  Cough - started 3 days ago, non productive. No SOB.  - could be from Acute PE, or her COPD or could also be a side effect of Gleevec, or atelectasis as seen on CXR and CTangio - ISS for atelectasis - tessalon for cough  RBBB - with Intraventricular Conduction delay - likely age related - no indication for urgent pacing currently per cards.  - follow up with primary cards about this - they may have discussed PPM  in the past.   HTN - at home on hydral 10 BID, cont here.  - can titrate up hydral for BP control - avoid bblocker given conduction delay  CHF - EF 65-46%, diastolic function could not  assessed  -repeat ECHo showed EF 45-50%. Mod-severe MR, pulm HTN PA pressure 69.  - appears euvolemic now.  Chronic normocytic anemia 2/2 to CKD - stable around 9-10. 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 Anabel Bene @[place]  not surgically resectable per EMR, supposed to be on Foss for lifetime (it's cytostatic, not cytocidal) - cont gleevec, can stop if becomes hospice care.   Dispo: Disposition is deferred at this time, awaiting improvement of current medical problems.  Anticipated discharge in approximately 1-2 day(s).   The patient  Has a current PCP Raina Mina, MD) and does need an [facility] hospital follow-up appointment after discharge.  The patient does not know have transportation limitations that hinder transportation to clinic appointments.  .Services Needed at time of discharge: Y = Yes, Blank = No PT:   OT:   RN:   Equipment:   Other:     LOS: 1 day   Dellia Nims, MD 02/25/2015, 7:33 AM

## 2015-02-25 NOTE — Progress Notes (Signed)
Subjective: Confused, may have had some chest pain this AM  Objective: Vital signs in last 24 hours: Temp:  [97.5 F (36.4 C)-98.7 F (37.1 C)] 98.6 F (37 C) (06/27 0500) Pulse Rate:  [95-99] 95 (06/27 0500) Resp:  [12-18] 13 (06/27 0500) BP: (159-164)/(59-71) 159/59 mmHg (06/27 0500) SpO2:  [96 %-100 %] 96 % (06/27 0500) Weight:  [145 lb 3.2 oz (65.862 kg)] 145 lb 3.2 oz (65.862 kg) (06/27 0500) Weight change: -5 lb 12.8 oz (-2.631 kg) Last BM Date: 02/22/15 Intake/Output from previous day: +261 06/26 0701 - 06/27 0700 In: 261.3 [I.V.:261.3] Out: -  Intake/Output this shift:    PE: General:Pleasant affect, NAD Skin:Warm and dry, brisk capillary refill HEENT:normocephalic, sclera clear, mucus membranes moist Heart:S1S2 RRR with soft murmur, no gallup, rub or click Lungs: with crackles in the bases, no rhonchi, or wheezes PRF:FMBW, non tender, + BS, do not palpate liver spleen or masses Ext:no lower ext edema,  2+ radial pulses Neuro:alert and oriented to person, somewhat confused MAE, follows commands, + facial symmetry Tele: SR short burst NSVT   Lab Results:  Recent Labs  02/24/15 0558 02/25/15 0613  WBC 4.6 6.3  HGB 9.2* 9.4*  HCT 29.4* 30.2*  PLT 177 204   BMET  Recent Labs  02/23/15 0920 02/24/15 0558  NA 138 136  K 3.3* 3.9  CL 96* 98*  CO2 28 30  GLUCOSE 130* 125*  BUN 15 <5*  CREATININE 6.60* 3.51*  CALCIUM 9.0 8.7*    Recent Labs  02/23/15 1500 02/23/15 2100  TROPONINI 0.12* 0.10*    Lab Results  Component Value Date   CHOL 137 12/26/2014   HDL 30* 12/26/2014   LDLCALC 83 12/26/2014   TRIG 119 12/26/2014   CHOLHDL 4.6 12/26/2014   Lab Results  Component Value Date   HGBA1C 5.0 10/28/2014     Lab Results  Component Value Date   TSH 3.431 02/23/2015    Hepatic Function Panel  Recent Labs  02/23/15 0920 02/24/15 0558  PROT 7.8  --   ALBUMIN 3.0* 2.8*  AST 19  --   ALT 8*  --   ALKPHOS 78  --   BILITOT  0.4  --    No results for input(s): CHOL in the last 72 hours. No results for input(s): PROTIME in the last 72 hours.     Studies/Results: Dg Chest 2 View  02/23/2015   CLINICAL DATA:  Midsternal chest pain since this morning with cough since yesterday and shortness-of-breath one year.  EXAM: CHEST  2 VIEW  COMPARISON:  01/31/2015  FINDINGS: Lungs are somewhat hypoinflated with no opacification over the right base likely right-sided effusion with atelectasis as cannot exclude infection in the right mid to lower lung. Minimal linear atelectasis over the left midlung. Stable cardiomegaly. Calcified plaque over the thoracoabdominal aorta. There are degenerate changes of the spine. Stent over the right upper arm unchanged.  IMPRESSION: New opacification over the right mid to lower lung likely effusion with atelectasis. Cannot exclude infection in the right mid to lower lung.  Stable cardiomegaly.   Electronically Signed   By: Marin Olp M.D.   On: 02/23/2015 09:50   Ct Angio Chest Pe W/cm &/or Wo Cm  02/23/2015   CLINICAL DATA:  Cough, chest pain, elevated D-dimer.  EXAM: CT ANGIOGRAPHY CHEST WITH CONTRAST  TECHNIQUE: Multidetector CT imaging of the chest was performed using the standard protocol during bolus administration of intravenous  contrast. Multiplanar CT image reconstructions and MIPs were obtained to evaluate the vascular anatomy.  CONTRAST:  63mL OMNIPAQUE IOHEXOL 350 MG/ML SOLN  COMPARISON:  CT chest dated 12/27/2014  FINDINGS: Lobar/segmental pulmonary emboli within branches of the left upper and lower lobe pulmonary arteries (series 5/images 101 and 108).  Overall clot burden is small to moderate.  Elevated elevated RV to LV ratio of 1.1, at least raising the possibility of right heart strain.  Mediastinum/Nodes: Cardiomegaly.  No pericardial effusion.  Coronary atherosclerosis.  Atherosclerotic calcifications of the aortic arch.  Small mediastinal lymph nodes which do not meet pathologic  CT size criteria.  Visualized thyroid is notable for coarse calcification in the right gland.  Lungs/Pleura: Patchy right lower lobe opacity, likely compressive atelectasis. Moderate right and small left pleural effusions.  Scattered atelectasis in the posterior upper lobes and left lower lobe.  6 mm nodular opacity in the posterior left upper lobe (series 4/image 24), grossly unchanged.  No pneumothorax.  Upper abdomen: Visualized upper abdomen is notable for hepatic and splenic granulomata as well as vascular calcifications.  Musculoskeletal: Degenerative changes of the visualized thoracolumbar spine.  Superior endplate compression fracture deformities at L1 and L2.  Review of the MIP images confirms the above findings.  IMPRESSION: Lobar/segmental pulmonary emboli within branches of the left upper and lower lobe pulmonary arteries. Overall clot burden is small to moderate. Elevated RV to LV ratio, at least raising the possibility of right heart strain.  Positive for acute PE with CT evidence of right heart strain (RV/LV Ratio = 1.1) consistent with at least submassive (intermediate risk) PE. The presence of right heart strain has been associated with an increased risk of morbidity and mortality. Please activate Code PE by paging [phone].  Critical Value/emergent results were called by telephone at the time of interpretation on 02/23/2015 at 5:38 pm to Dr. Laurance Flatten, who verbally acknowledged these results.   Electronically Signed   By: Julian Hy M.D.   On: 02/23/2015 17:44   ECHO: - Left ventricle: The cavity size was normal. Wall thickness was increased in a pattern of moderate LVH. Systolic function was mildly reduced. The estimated ejection fraction was in the range of 45% to 50%. Basal inferior and basal inferolateral akinesis, mid inferior and mid inferolateral hypokinesis. Indeterminant diastolic function. - Aortic valve: Trileaflet; moderately calcified  leaflets. Sclerosis without stenosis. There was mild regurgitation. - Mitral valve: Mildly to moderately calcified annulus. There was moderate to severe regurgitation. This may be infarct-related given the inferior and inferolateral wall motion abnormalities. - Left atrium: The atrium was mildly dilated. - Right ventricle: The cavity size was normal. Systolic function was mildly reduced. - Tricuspid valve: Peak RV-RA gradient (S): 54 mm Hg. - Pulmonary arteries: PA peak pressure: 69 mm Hg (S). - Systemic veins: IVC measured 2.2 cm with < 50% respirophasic variation, suggesting RA pressure 15 mmHg. Impressions: - Normal LV size with moderate LV hypertrophy. EF 45-50% with wall motion abnormalities as noted above. Moderate diastolic dysfunction. Normal RV size with mildly reduced systolic function. Moderate to severe MR, may be infarct-related given inferior and inferolateral wall motion abnormalities. Moderate to severe pulmonary hypertension. Dilated IVC.   Medications: I have reviewed the patient's current medications. Scheduled Meds: . aspirin EC  81 mg Oral Daily  . azelastine  1 spray Each Nare Daily  . benzonatate  100 mg Oral BID  . budesonide  2 mL Inhalation BID  . [START ON 02/26/2015] calcitRIOL  2 mcg Oral  Q T,Th,Sa-HD  . calcium acetate  667 mg Oral BID WC  . darbepoetin (ARANESP) injection - DIALYSIS  150 mcg Intravenous Q Sat-HD  . feeding supplement (NEPRO CARB STEADY)  237 mL Oral BID BM  . ferric gluconate (FERRLECIT/NULECIT) IV  125 mg Intravenous Q Sat-HD  . hydrALAZINE  10 mg Oral BID  . imatinib  100 mg Oral Q24H  . insulin aspart  0-9 Units Subcutaneous TID WC  . loratadine  10 mg Oral Daily  . multivitamin  1 tablet Oral QHS  . pantoprazole  40 mg Oral Daily  . sodium chloride  3 mL Intravenous Q12H  . Warfarin - Pharmacist Dosing Inpatient   Does not apply q1800   Continuous Infusions: . heparin 1,200 Units/hr (02/24/15 1749)    PRN Meds:.acetaminophen, albuterol, docusate sodium, polyethylene glycol, traMADol  Assessment/Plan: 1. Chest pain syndrome--> likely due to PTE On heparin drip Coumadin started See below   2. Elevated troponin  0.12 Not an MI Due to PTE   3. RBBB Present on ekgs for several years She has an 79 year old conduction system but presently no indication for pacing urgently Follow-up with primary cardiologist Keep on telemetry while here  4. HTN - borderline controlled - increase Hydralazine as needed for control. Avoid BB given conduction system disease  5. Chronic combined systolic/diastolic CHF (EF 97-28%)  Echo with same EF from 10/2014, monitor   6. MR- mod to severe  7. Pul HTN with PA pressure 69 Hgmm  Palliative care met with pt and plan was to stop dialysis and warfarin  Currently she does not want to stop dialysis.    LOS: 1 day   Time spent with pt. :15 minutes. [facility]  Nurse Practitioner Certified Pager [phone] or after 5pm and on weekends call [phone] 02/25/2015, 7:44 AM   History and all data above reviewed.  Patient examined.  I agree with the findings as above. She has a non productive cough but she is not reporting any pain.  The patient exam reveals COR:RRR  ,  Lungs: Decreased breath sounds   ,  Abd: Positive bowel sounds, no rebound no guarding, Ext No edema  .  All available labs, radiology testing, previous records reviewed. Agree with documented assessment and plan. Elevated enzymes.  Probably related to pulmonary emboli.  She does have a mildly reduced EF with inferior akinesis and MR which might be related to a prior infarct.  For now medical management.  We could consider noninvasive stress testing for risk evaluation in the future. However, continued medical management even in the absence of further noninvasive evaluation would be quite reasonable.  Jeneen Rinks Jamey Harman  9:14 AM  02/25/2015

## 2015-02-25 NOTE — Progress Notes (Signed)
  PROGRESS NOTE MEDICINE TEACHING ATTENDING   Day 1 of stay Patient name: [place] record number: [id number] Date of birth: Jan 02, 1928   No new complaints.  Blood pressure 159/59, pulse 95, temperature 98.6 F (37 C), temperature source Oral, resp. rate 13, height 5\' 2"  (1.575 m), weight 145 lb 3.2 oz (65.862 kg), SpO2 98 %. The patient is alert and oriented, elderly, comfortable, in no acute distress. PERRL, EOMI. Heart exhibits regular rate and rhythm, no murmurs. Lungs are clear to auscultation. Abdomen is soft and non-tender. There is no pedal edema and good pedal pulses. There are no gross focal neurological deficits apparent.   Assessment/Plan Acute PE -  Discussed with Nephrology per Dr Genene Churn, that it will ok to discharge her on Lovenox (given HD). We will bridge her with lovenox and coumadin, and her PCP will follow up on the INR. Her cough is likely associated with PE. No chest pain.   ESRD on HD - There were discussions where the patient wanted to opt for hospice, however this morning she does not. She wants full therapy for all her ailments. Dr Genene Churn talked to her PCP who informed that he has discussed hospice before with the patient, and will discuss it again. FOr now we continue with HD.   RBBB - The patient will follow up with her cardiologist.  I have discussed the care of this patient with my IM team residents. Please see the resident note for details.  [place], [place] 02/25/2015, 11:18 AM.

## 2015-02-25 NOTE — Plan of Care (Signed)
Problem: Phase I Progression Outcomes Goal: Voiding-avoid urinary catheter unless indicated Outcome: Not Applicable Date Met:  20/76/19 Pt is anuric

## 2015-02-25 NOTE — Consult Note (Signed)
ANTICOAGULATION CONSULT NOTE Pharmacy Consult for Heparin  Indication: pulmonary embolus  No Known Allergies  Patient Measurements: Height: 5\' 2"  (157.5 cm) Weight: 146 lb 13.2 oz (66.6 kg) IBW/kg (Calculated) : 50.1 Heparin Dosing Weight: 65 kg  Vital Signs: Temp: 97.5 F (36.4 C) (06/26 2100) Temp Source: Oral (06/26 1400) BP: 164/69 mmHg (06/26 2100) Pulse Rate: 97 (06/26 2100)  Labs:  Recent Labs  02/23/15 0920 02/23/15 1500 02/23/15 2100 02/24/15 0558 02/24/15 1340 02/24/15 2320  HGB 9.2*  --   --  9.2*  --   --   HCT 29.3*  --   --  29.4*  --   --   PLT 219  --   --  177  --   --   LABPROT  --   --   --  15.7*  --   --   INR  --   --   --  1.24  --   --   HEPARINUNFRC  --   --   --  0.68 0.38 0.41  CREATININE 6.60*  --   --  3.51*  --   --   TROPONINI  --  0.12* 0.10*  --   --   --     Estimated Creatinine Clearance: 10.5 mL/min (by C-G formula based on Cr of 3.51).  Assessment: 81 y.o. female with PE for anticoagulation Goal of Therapy:  Heparin level 0.3-0.7 units/ml Monitor platelets by anticoagulation protocol: Yes   Plan:  Continue Heparin at current rate  Follow-up am labs.  Phillis Knack, PharmD, BCPS

## 2015-02-25 NOTE — Discharge Summary (Signed)
Name: Annette Roach MRN: [id number] DOB: June 23, 1928 81 y.o. PCP: Raina Mina, MD  Date of Admission: 02/23/2015  8:51 AM Date of Discharge: 02/25/2015 Attending Physician: Madilyn Fireman, MD  Discharge Diagnosis:  Principal Problem:   Acute pulmonary embolism Active Problems:   Hypertension   CAD (coronary artery disease)   SOB (shortness of breath)   Chest pain   Elevated troponin   Cough  Discharge Medications:   Medication List    STOP taking these medications        ondansetron 4 MG tablet  Commonly known as:  ZOFRAN      TAKE these medications        acetaminophen 500 MG tablet  Commonly known as:  TYLENOL  Take 500 mg by mouth 2 (two) times daily as needed for moderate pain. For pain     VENTOLIN HFA 108 (90 BASE) MCG/ACT inhaler  Generic drug:  albuterol  Inhale 2 puffs into the lungs every 6 (six) hours as needed for wheezing or shortness of breath.     albuterol (2.5 MG/3ML) 0.083% nebulizer solution  Commonly known as:  PROVENTIL  Take 2.5 mg by nebulization every 6 (six) hours as needed for wheezing or shortness of breath.     aspirin EC 81 MG tablet  Take 81 mg by mouth at bedtime.     Azelastine HCl 0.15 % Soln  Place 1 spray into both nostrils daily.     beclomethasone 80 MCG/ACT inhaler  Commonly known as:  QVAR  Inhale 2 puffs into the lungs 2 (two) times daily.     benzonatate 100 MG capsule  Commonly known as:  TESSALON  Take 1 capsule (100 mg total) by mouth 2 (two) times daily.     calcitRIOL 0.25 MCG capsule  Commonly known as:  ROCALTROL  Take 7 capsules (1.75 mcg total) by mouth every dialysis (secondary hyperparathyroidism).     calcium acetate 667 MG capsule  Commonly known as:  PHOSLO  Take 667 mg by mouth 2 (two) times daily before a meal.     DIETARY FIBER LAXATIVE PO  Take 1 capsule by mouth daily as needed (Constipation).     docusate sodium 100 MG capsule  Commonly known as:  COLACE  Take 100 mg by mouth 2  (two) times daily as needed for mild constipation.     enoxaparin 150 MG/ML injection  Commonly known as:  LOVENOX  Inject 0.44 mLs (65 mg total) into the skin daily.     feeding supplement (NEPRO CARB STEADY) Liqd  Take 237 mLs by mouth as needed (missed meal during dialysis.).     hydrALAZINE 10 MG tablet  Commonly known as:  APRESOLINE  Take 10 mg by mouth 2 (two) times daily.     imatinib 100 MG tablet  Commonly known as:  GLEEVEC  Take 100 mg by mouth daily. Take at 3:30pm daily     multivitamin Tabs tablet  Take 1 tablet by mouth daily.     nitroGLYCERIN 0.4 MG SL tablet  Commonly known as:  NITROSTAT  Place 0.4 mg under the tongue every 5 (five) minutes as needed for chest pain.     pantoprazole 40 MG tablet  Commonly known as:  PROTONIX  Take 40 mg by mouth daily.     polyethylene glycol packet  Commonly known as:  MIRALAX / GLYCOLAX  Take 17 g by mouth daily.     prochlorperazine 10 MG tablet  Commonly known as:  COMPAZINE  TAKE 1 TABLET EVERY 6 HOURS AS NEEDED FOR CHEMO NAUSEA-MAY TAKE AROUND THE CLOCK FOR PERSISTENT SYMP     traMADol 50 MG tablet  Commonly known as:  ULTRAM  Take 50 mg by mouth every 6 (six) hours as needed for moderate pain.     warfarin 5 MG tablet  Commonly known as:  COUMADIN  Take 1.5 tablets (7.5 mg total) by mouth one time only at 6 PM.        Disposition and follow-up:   Annette Roach was discharged from [facility] in [place] condition.  At the hospital follow up visit please address:  1.  Please have her follow up with her cardiologist about possible stress test when she is more stable from her PE.  Please follow INR closely, bridging with lovenox 1mg /kg/24hr + coumadin now for PE. Stop lovenox when appropriate and dose coumadin based on INR.   Further GOC discussion should be carried out by PCP. Patient may be leaning towards hospice care.  Continue all treatments for now.  2.  Labs / imaging needed at  time of follow-up: INR  3.  Pending labs/ test needing follow-up:   Follow-up Appointments: Follow-up Information    Follow up with Gilford Rile, MD.   Specialty:  Internal Medicine   Contact information:   [address] [place] [address] [phone]       Discharge Instructions:   Consultations: Treatment Team:  [facility], MD Corliss Parish, MD [facility]  Procedures Performed:  Dg Chest 2 View  02/23/2015   CLINICAL DATA:  Midsternal chest pain since this morning with cough since yesterday and shortness-of-breath one year.  EXAM: CHEST  2 VIEW  COMPARISON:  01/31/2015  FINDINGS: Lungs are somewhat hypoinflated with no opacification over the right base likely right-sided effusion with atelectasis as cannot exclude infection in the right mid to lower lung. Minimal linear atelectasis over the left midlung. Stable cardiomegaly. Calcified plaque over the thoracoabdominal aorta. There are degenerate changes of the spine. Stent over the right upper arm unchanged.  IMPRESSION: New opacification over the right mid to lower lung likely effusion with atelectasis. Cannot exclude infection in the right mid to lower lung.  Stable cardiomegaly.   Electronically Signed   By: Marin Olp M.D.   On: 02/23/2015 09:50   Ct Angio Chest Pe W/cm &/or Wo Cm  02/23/2015   CLINICAL DATA:  Cough, chest pain, elevated D-dimer.  EXAM: CT ANGIOGRAPHY CHEST WITH CONTRAST  TECHNIQUE: Multidetector CT imaging of the chest was performed using the standard protocol during bolus administration of intravenous contrast. Multiplanar CT image reconstructions and MIPs were obtained to evaluate the vascular anatomy.  CONTRAST:  80mL OMNIPAQUE IOHEXOL 350 MG/ML SOLN  COMPARISON:  CT chest dated 12/27/2014  FINDINGS: Lobar/segmental pulmonary emboli within branches of the left upper and lower lobe pulmonary arteries (series 5/images 101 and 108).  Overall clot burden is small to moderate.   Elevated elevated RV to LV ratio of 1.1, at least raising the possibility of right heart strain.  Mediastinum/Nodes: Cardiomegaly.  No pericardial effusion.  Coronary atherosclerosis.  Atherosclerotic calcifications of the aortic arch.  Small mediastinal lymph nodes which do not meet pathologic CT size criteria.  Visualized thyroid is notable for coarse calcification in the right gland.  Lungs/Pleura: Patchy right lower lobe opacity, likely compressive atelectasis. Moderate right and small left pleural effusions.  Scattered atelectasis in the posterior upper lobes and left lower lobe.  6  mm nodular opacity in the posterior left upper lobe (series 4/image 24), grossly unchanged.  No pneumothorax.  Upper abdomen: Visualized upper abdomen is notable for hepatic and splenic granulomata as well as vascular calcifications.  Musculoskeletal: Degenerative changes of the visualized thoracolumbar spine.  Superior endplate compression fracture deformities at L1 and L2.  Review of the MIP images confirms the above findings.  IMPRESSION: Lobar/segmental pulmonary emboli within branches of the left upper and lower lobe pulmonary arteries. Overall clot burden is small to moderate. Elevated RV to LV ratio, at least raising the possibility of right heart strain.  Positive for acute PE with CT evidence of right heart strain (RV/LV Ratio = 1.1) consistent with at least submassive (intermediate risk) PE. The presence of right heart strain has been associated with an increased risk of morbidity and mortality. Please activate Code PE by paging [phone].  Critical Value/emergent results were called by telephone at the time of interpretation on 02/23/2015 at 5:38 pm to Dr. Laurance Flatten, who verbally acknowledged these results.   Electronically Signed   By: Julian Hy M.D.   On: 02/23/2015 17:44    2D Echo:   Study Conclusions  - Left ventricle: The cavity size was normal. Wall thickness was increased in a pattern of  moderate LVH. Systolic function was mildly reduced. The estimated ejection fraction was in the range of 45% to 50%. Basal inferior and basal inferolateral akinesis, mid inferior and mid inferolateral hypokinesis. Indeterminant diastolic function. - Aortic valve: Trileaflet; moderately calcified leaflets. Sclerosis without stenosis. There was mild regurgitation. - Mitral valve: Mildly to moderately calcified annulus. There was moderate to severe regurgitation. This may be infarct-related given the inferior and inferolateral wall motion abnormalities. - Left atrium: The atrium was mildly dilated. - Right ventricle: The cavity size was normal. Systolic function was mildly reduced. - Tricuspid valve: Peak RV-RA gradient (S): 54 mm Hg. - Pulmonary arteries: PA peak pressure: 69 mm Hg (S). - Systemic veins: IVC measured 2.2 cm with < 50% respirophasic variation, suggesting RA pressure 15 mmHg.   Admission HPI:   81 yo female with hx of CAD, CHF EF 45-50%,, COPD, DM II, HTN, HLD, GIST presented with chest pain starting this morning around 6:30 AM. She was lying down and not doing any activity when the pain started. It was located at mid chest without radiation, dull, 7/10. No SOB, palpitation, dizziness, or any other symptoms. It resolved by the time we saw him with Nitro x3.  Has been having dry cough for last 3 days. No fever/chills, sick contacts, travel. Has COPD with occasional SOB but not having any breathing issues currently. Has reported hx of CAD, had cath very long time ago per daughter. They are not sure about the results but only knows that her cardiologist Dr. Jenne Campus at [facility] told her she would need a pacemaker placed.   Denies any hx of blood clot, prolonged travel, calf pain. 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 Anabel [facility] at [facility].  Denies any other symptoms currently. Due to HD today.   Hospital Course by problem  list:   81 yo female with CAD, ESRD on HD, GIST here with acute PE.  Acute left lobar PE - small to moderate This is likely the cause of her chest pain which she presented with from yesterday. 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 - echo didn't show right heart strain, showed mod-severe MR and PULM  HTN 69, EF 72-53%, and diastolic dysfunction. - switch from heparin gtt to lovenox 1mg /kg/24 hour + coumadin for now. Spoke with Dr. Beryle Beams, recommends coumadin over other anticoags for her.  - will bridge with lovenox at home. Asked Dr. Bea Graff her pcp to check INR at their clinic. He is agreeable, but he mentioned he will again recommend hospice for her given her GIST and risk of bleeding with blood thinners.  - needs life time anticoag 2/2 to GIST, unless patient decides hospice later.  - vitals stable. Will discharge today with close follow up with PCP.  Elevated troponin with hx of CAD did have CP but that's likely from PE. No EKG changes. Mild trop elevation is likely from her ESRD + acute PE.  Does have mildly reduced EF with inferior akinesis and MR which could reflect prior infarct. - appreciate card recs: medical management for now, consider elective outpatient stress test when more stable with her regular cardiologist.  - cont asa 81. cont nitro prn.   ESRD on T, Th, Sat HD- had HD 6/25 night - calcitriol, phoslo rena-Vit, weekly FE, aranesp,  - patient is considering stopping HD per nephro note, she will be talking to her daughter about it. As of now, continue HD as scheduled.  Cough - started 3 days ago, non productive. No SOB.  - could be from Acute PE, or her COPD or could also be a side effect of Gleevec, or atelectasis as seen on CXR and CTangio - ISS for atelectasis - tessalon for cough  RBBB - with Intraventricular Conduction delay - likely age related - no indication for urgent pacing currently per cards.  - follow  up with primary cards about this - they may have discussed PPM in the past.   HTN - at home on hydral 10 BID, cont here.  - avoid bblocker given conduction delay  CHF - EF 66-44%, diastolic function could not assessed  -repeat ECHo showed EF 45-50%. Mod-severe MR, pulm HTN PA pressure 69.  - appears euvolemic now.  Chronic normocytic anemia 2/2 to CKD - stable around 9-10.   COPD -  - cont albuterol prn, qvar, ventolin  GIST - follows up with Dr. Anabel Bene @[place]  not surgically resectable per EMR, supposed to be on [place] for lifetime (it's cytostatic, not cytocidal) - cont gleevec, can stop if becomes hospice care.   Discharge Vitals:   BP 159/59 mmHg  Pulse 95  Temp(Src) 98.6 F (37 C) (Oral)  Resp 13  Ht 5\' 2"  (1.575 m)  Wt 145 lb 3.2 oz (65.862 kg)  BMI 26.55 kg/m2  SpO2 98%  Discharge Labs:  Results for orders placed or performed during the hospital encounter of 02/23/15 (from the past 24 hour(s))  Glucose, capillary     Status: Abnormal   Collection Time: 02/24/15 11:55 AM  Result Value Ref Range   Glucose-Capillary 133 (H) 65 - 99 mg/dL  Heparin level (unfractionated)     Status: None   Collection Time: 02/24/15  1:40 PM  Result Value Ref Range   Heparin Unfractionated 0.38 0.30 - 0.70 IU/mL  Glucose, capillary     Status: Abnormal   Collection Time: 02/24/15  4:44 PM  Result Value Ref Range   Glucose-Capillary 124 (H) 65 - 99 mg/dL  Glucose, capillary     Status: Abnormal   Collection Time: 02/24/15  9:03 PM  Result Value Ref Range   Glucose-Capillary 114 (H) 65 - 99 mg/dL  Heparin level (unfractionated)  Status: None   Collection Time: 02/24/15 11:20 PM  Result Value Ref Range   Heparin Unfractionated 0.41 0.30 - 0.70 IU/mL  CBC     Status: Abnormal   Collection Time: 02/25/15  6:13 AM  Result Value Ref Range   WBC 6.3 4.0 - 10.5 K/uL   RBC 3.49 (L) 3.87 - 5.11 MIL/uL   Hemoglobin 9.4 (L) 12.0 - 15.0 g/dL   HCT 30.2 (L) 36.0 - 46.0 %    MCV 86.5 78.0 - 100.0 fL   MCH 26.9 26.0 - 34.0 pg   MCHC 31.1 30.0 - 36.0 g/dL   RDW 17.8 (H) 11.5 - 15.5 %   Platelets 204 150 - 400 K/uL  Heparin level (unfractionated)     Status: None   Collection Time: 02/25/15  6:13 AM  Result Value Ref Range   Heparin Unfractionated 0.36 0.30 - 0.70 IU/mL  Protime-INR     Status: None   Collection Time: 02/25/15  6:13 AM  Result Value Ref Range   Prothrombin Time 14.9 11.6 - 15.2 seconds   INR 1.16 0.00 - 1.49  Renal function panel     Status: Abnormal   Collection Time: 02/25/15  6:13 AM  Result Value Ref Range   Sodium 138 135 - 145 mmol/L   Potassium 4.6 3.5 - 5.1 mmol/L   Chloride 104 101 - 111 mmol/L   CO2 23 22 - 32 mmol/L   Glucose, Bld 90 65 - 99 mg/dL   BUN 10 6 - 20 mg/dL   Creatinine, Ser 5.92 (H) 0.44 - 1.00 mg/dL   Calcium 9.7 8.9 - 10.3 mg/dL   Phosphorus 4.4 2.5 - 4.6 mg/dL   Albumin 2.9 (L) 3.5 - 5.0 g/dL   GFR calc non Af Amer 6 (L) >60 mL/min   GFR calc Af Amer 7 (L) >60 mL/min   Anion gap 11 5 - 15  Glucose, capillary     Status: None   Collection Time: 02/25/15  7:34 AM  Result Value Ref Range   Glucose-Capillary 99 65 - 99 mg/dL    Signed: Dellia Nims, MD 02/25/2015, 11:04 AM    Services Ordered on Discharge:  Equipment Ordered on Discharge:

## 2015-02-25 NOTE — Discharge Instructions (Signed)
You need to follow up with your primary doctor and also your heart doctor very closely.  We need you to go to the coumadin clinic to monitor your coumadin level in blood. Dr. Bea Graff said he can check it at his clinic.   Keep taking your coumadin. Also use the lovenox shot until your coumadin level is therapeutic. Your primary doctor who will follow your INR level (coumadin) will tell you what to do.

## 2015-02-25 NOTE — Progress Notes (Signed)
Lovenox injection instructions provided to patients daughter and grandson (who will be actually giving inj at home).  All questions answered and grandson demonstrated and gave patients injection.  Pt was also given todays dose of Coumadin 7.5 mg prior to discharge.  All instructions reviewed with patients daughter who voiced understanding.  Daughter instructed to call primary MD for follow up appointment (office closed at time of discharge). All belongings taken home with patient by family. Jessie Foot, RN

## 2015-02-25 NOTE — Progress Notes (Signed)
Subjective:  Co dry cough , no sob , agrees to Continue Hemodialysis for now ,HD in am   Objective Vital signs in last 24 hours: Filed Vitals:   02/24/15 2000 02/24/15 2100 02/25/15 0500 02/25/15 0836  BP:  164/69 159/59   Pulse:  97 95   Temp:  97.5 F (36.4 C) 98.6 F (37 C)   TempSrc:      Resp:  12 13   Height:      Weight:   65.862 kg (145 lb 3.2 oz)   SpO2: 99% 100% 96% 98%   Weight change: -2.631 kg (-5 lb 12.8 oz)  Physical Exam: General: ALert Ox3, NAD.  Heart: RRR with 1/6 sem lsb no rub or gallop  Lungs: Soft cracle r base otherwise CTA/ nonlaboured breathing   O2 sat 98%  o2 [place] Abdomen: Soft NT, ND Extremities: No pedal  edema Dialysis Access: Right arm AVG + bruit   OP Dialysis Orders: Center: [facility] 3.5 hr 400/A 1.5 EDW 68.5 2 K 2 Ca right upper AVGG heaprin 5000 Mircera 150q 2 weeks last given 6/14 no Fe last venofer 50 6/2 calcitriol 2 TIW Recent labs: Hgb 9.6 29% sat 6/23 ferritin 601 1/28 iPTH 808 6/23 increasing   Problem/Plan: 1. New acute PE =  cards following; heparin/coumadin started/ at dc need provider TO MANAGE cOUMADIN (inr  Can be drawen at op hd and faxed to provider managing coumadin)on home O2 and nebs 2. ESRD - TTS - K 4.6/ SHE WANTS TO CONTINUE HD FOR NOW/ I DW HER  STOPPING OR NOT/ leaving below EDW with very low gains and poor intake - needs lower edw at d/c; NOTED=K on admission may have been lower than usual because she had two HD treatments back to back due to declot of [place] earlier this week 3. Hypertension/volume -  159/59 BP /decrease volume - not on antihypertensive meds - likely losing weight due to gastric cancer and poor intake 4. Anemia - Hgb9.4 / Mircera increased from 100 to 150 on 6/14, add weekly Fe, redosed Aranesp 150 6/25 5. Metabolic bone disease - Takes phoslo  BID wit meals  - leave at BID for now; continue calcitriol 2 TIW 6. Nutrition - alb 2.9/ renal diet +vitamin, added supplements - changed to regular diet to  help with intake in hosp  7. 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 Anabel Bene- likely explains her poor intake; likely losing weight 8. DNR- Pallative care did see/ yest she wanted to stop hd  This am  Wants to continue Ernest Haber, PA-C [place] [phone] 02/25/2015,11:09 AM  LOS: 1 day   Pt seen, examined and agree w A/P as above.  Kelly Splinter MD pager [phone]    cell [phone] 02/25/2015, 1:27 PM    Labs: Basic Metabolic Panel:  Recent Labs Lab 02/23/15 0920 02/23/15 1500 02/24/15 0558 02/25/15 0613  NA 138  --  136 138  K 3.3*  --  3.9 4.6  CL 96*  --  98* 104  CO2 28  --  30 23  GLUCOSE 130*  --  125* 90  BUN 15  --  <5* 10  CREATININE 6.60*  --  3.51* 5.92*  CALCIUM 9.0  --  8.7* 9.7  PHOS  --  4.9* 2.8 4.4   Liver Function Tests:  Recent Labs Lab 02/23/15 0920 02/24/15 0558 02/25/15 0613  AST 19  --   --   ALT 8*  --   --  ALKPHOS 78  --   --   BILITOT 0.4  --   --   PROT 7.8  --   --   ALBUMIN 3.0* 2.8* 2.9*   No results for input(s): LIPASE, AMYLASE in the last 168 hours. No results for input(s): AMMONIA in the last 168 hours. CBC:  Recent Labs Lab 02/23/15 0920 02/24/15 0558 02/25/15 0613  WBC 4.5 4.6 6.3  NEUTROABS 2.9  --   --   HGB 9.2* 9.2* 9.4*  HCT 29.3* 29.4* 30.2*  MCV 84.7 84.5 86.5  PLT 219 177 204   Cardiac Enzymes:  Recent Labs Lab 02/23/15 1500 02/23/15 2100  TROPONINI 0.12* 0.10*   CBG:  Recent Labs Lab 02/24/15 0740 02/24/15 1155 02/24/15 1644 02/24/15 2103 02/25/15 0734  GLUCAP 121* 133* 124* 114* 99    Studies/Results: Ct Angio Chest Pe W/cm &/or Wo Cm  02/23/2015   CLINICAL DATA:  Cough, chest pain, elevated D-dimer.  EXAM: CT ANGIOGRAPHY CHEST WITH CONTRAST  TECHNIQUE: Multidetector CT imaging of the chest was performed using the standard protocol during bolus administration of intravenous contrast. Multiplanar CT image reconstructions and MIPs were obtained to  evaluate the vascular anatomy.  CONTRAST:  28mL OMNIPAQUE IOHEXOL 350 MG/ML SOLN  COMPARISON:  CT chest dated 12/27/2014  FINDINGS: Lobar/segmental pulmonary emboli within branches of the left upper and lower lobe pulmonary arteries (series 5/images 101 and 108).  Overall clot burden is small to moderate.  Elevated elevated RV to LV ratio of 1.1, at least raising the possibility of right heart strain.  Mediastinum/Nodes: Cardiomegaly.  No pericardial effusion.  Coronary atherosclerosis.  Atherosclerotic calcifications of the aortic arch.  Small mediastinal lymph nodes which do not meet pathologic CT size criteria.  Visualized thyroid is notable for coarse calcification in the right gland.  Lungs/Pleura: Patchy right lower lobe opacity, likely compressive atelectasis. Moderate right and small left pleural effusions.  Scattered atelectasis in the posterior upper lobes and left lower lobe.  6 mm nodular opacity in the posterior left upper lobe (series 4/image 24), grossly unchanged.  No pneumothorax.  Upper abdomen: Visualized upper abdomen is notable for hepatic and splenic granulomata as well as vascular calcifications.  Musculoskeletal: Degenerative changes of the visualized thoracolumbar spine.  Superior endplate compression fracture deformities at L1 and L2.  Review of the MIP images confirms the above findings.  IMPRESSION: Lobar/segmental pulmonary emboli within branches of the left upper and lower lobe pulmonary arteries. Overall clot burden is small to moderate. Elevated RV to LV ratio, at least raising the possibility of right heart strain.  Positive for acute PE with CT evidence of right heart strain (RV/LV Ratio = 1.1) consistent with at least submassive (intermediate risk) PE. The presence of right heart strain has been associated with an increased risk of morbidity and mortality. Please activate Code PE by paging [phone].  Critical Value/emergent results were called by telephone at the time of  interpretation on 02/23/2015 at 5:38 pm to Dr. Laurance Flatten, who verbally acknowledged these results.   Electronically Signed   By: Julian Hy M.D.   On: 02/23/2015 17:44   Medications:   . aspirin EC  81 mg Oral Daily  . azelastine  1 spray Each Nare Daily  . benzonatate  100 mg Oral BID  . budesonide  2 mL Inhalation BID  . [START ON 02/26/2015] calcitRIOL  2 mcg Oral Q T,Th,Sa-HD  . calcium acetate  667 mg Oral BID WC  . darbepoetin (ARANESP) injection - DIALYSIS  150 mcg Intravenous Q Sat-HD  . enoxaparin (LOVENOX) injection  1 mg/kg Subcutaneous Q12H  . feeding supplement (NEPRO CARB STEADY)  237 mL Oral BID BM  . ferric gluconate (FERRLECIT/NULECIT) IV  125 mg Intravenous Q Sat-HD  . hydrALAZINE  10 mg Oral BID  . imatinib  100 mg Oral Q24H  . insulin aspart  0-9 Units Subcutaneous TID WC  . loratadine  10 mg Oral Daily  . multivitamin  1 tablet Oral QHS  . pantoprazole  40 mg Oral Daily  . sodium chloride  3 mL Intravenous Q12H  . warfarin  7.5 mg Oral ONCE-1800  . Warfarin - Pharmacist Dosing Inpatient   Does not apply [phone]

## 2015-02-25 NOTE — Consult Note (Addendum)
[place] for Enoxaparin and Coumadin Indication: pulmonary embolus  No Known Allergies  Patient Measurements: Height: 5\' 2"  (157.5 cm) Weight: 145 lb 3.2 oz (65.862 kg) IBW/kg (Calculated) : 50.1 Heparin Dosing Weight: 65 kg  Vital Signs: Temp: 98.6 F (37 C) (06/27 0500) BP: 159/59 mmHg (06/27 0500) Pulse Rate: 95 (06/27 0500)  Labs:  Recent Labs  02/23/15 0920 02/23/15 1500 02/23/15 2100  02/24/15 0558 02/24/15 1340 02/24/15 2320 02/25/15 0613  HGB 9.2*  --   --   --  9.2*  --   --  9.4*  HCT 29.3*  --   --   --  29.4*  --   --  30.2*  PLT 219  --   --   --  177  --   --  204  LABPROT  --   --   --   --  15.7*  --   --  14.9  INR  --   --   --   --  1.24  --   --  1.16  HEPARINUNFRC  --   --   --   < > 0.68 0.38 0.41 0.36  CREATININE 6.60*  --   --   --  3.51*  --   --  5.92*  TROPONINI  --  0.12* 0.10*  --   --   --   --   --   < > = values in this interval not displayed.  Estimated Creatinine Clearance: 6.2 mL/min (by C-G formula based on Cr of 5.92).  Assessment: 81 yo F admitted on 02/23/2015 with chest pain and cough  PMH: COPD, HTN/HLD, DMII, depression/anxiety, GERD, arthritis, CAD, ESRD on HD TTS, GIST   AC: Heparin for new PE (per CT 6/25, small to moderate clot burden, evidence of right heart strain) also to begin coumadin (coumadin score = 4); BL INR 1.24  HL therapeutic x 2 (0.36 this am), INR 1.16. Being transitioned to therapeutic enoxaparin   Warfarin 7.5 (6/26)  CV: HTN/HLD, CAD SBP 140-180s, HR <100 (SR); EF 45-50% ASA 81 (PTA), hydralazine 10 po bid (PTA)  Goal of Therapy:  Heparin level 0.3-0.7 units/ml  INR 2 - 3 Monitor platelets by anticoagulation protocol: Yes   Plan:  - D/c Heparin - Initiate enoxaparin 65 mg q24h - Coumadin 7.5mg  x 1 - Daily INR - Coumadin education (h/o until decision made about hospice, with potential plans to d/c warfarin)  Levester Fresh, PharmD, BCPS Clinical  Pharmacist Pager [phone] 02/25/2015 9:36 AM

## 2015-03-01 DIAGNOSIS — J189 Pneumonia, unspecified organism: Secondary | ICD-10-CM

## 2015-03-01 HISTORY — DX: Pneumonia, unspecified organism: J18.9

## 2015-03-22 ENCOUNTER — Emergency Department (HOSPITAL_COMMUNITY)
Admission: EM | Admit: 2015-03-22 | Discharge: 2015-03-22 | Disposition: A | Discharge status: Home / Self Care | Payer: Medicare Other | Source: Home / Self Care | Attending: Emergency Medicine | Admitting: Emergency Medicine

## 2015-03-22 ENCOUNTER — Emergency Department (HOSPITAL_COMMUNITY): Payer: Medicare Other

## 2015-03-22 ENCOUNTER — Encounter (HOSPITAL_COMMUNITY): Payer: Self-pay | Admitting: Family Medicine

## 2015-03-22 DIAGNOSIS — K219 Gastro-esophageal reflux disease without esophagitis: Secondary | ICD-10-CM | POA: Insufficient documentation

## 2015-03-22 DIAGNOSIS — J9811 Atelectasis: Secondary | ICD-10-CM

## 2015-03-22 DIAGNOSIS — E785 Hyperlipidemia, unspecified: Secondary | ICD-10-CM

## 2015-03-22 DIAGNOSIS — I25119 Atherosclerotic heart disease of native coronary artery with unspecified angina pectoris: Secondary | ICD-10-CM | POA: Insufficient documentation

## 2015-03-22 DIAGNOSIS — Z7951 Long term (current) use of inhaled steroids: Secondary | ICD-10-CM

## 2015-03-22 DIAGNOSIS — R000 Tachycardia, unspecified: Secondary | ICD-10-CM | POA: Insufficient documentation

## 2015-03-22 DIAGNOSIS — Z7982 Long term (current) use of aspirin: Secondary | ICD-10-CM | POA: Insufficient documentation

## 2015-03-22 DIAGNOSIS — E1140 Type 2 diabetes mellitus with diabetic neuropathy, unspecified: Secondary | ICD-10-CM | POA: Diagnosis present

## 2015-03-22 DIAGNOSIS — Z79899 Other long term (current) drug therapy: Secondary | ICD-10-CM

## 2015-03-22 DIAGNOSIS — I120 Hypertensive chronic kidney disease with stage 5 chronic kidney disease or end stage renal disease: Secondary | ICD-10-CM | POA: Insufficient documentation

## 2015-03-22 DIAGNOSIS — I2510 Atherosclerotic heart disease of native coronary artery without angina pectoris: Secondary | ICD-10-CM | POA: Diagnosis present

## 2015-03-22 DIAGNOSIS — N2581 Secondary hyperparathyroidism of renal origin: Secondary | ICD-10-CM | POA: Diagnosis present

## 2015-03-22 DIAGNOSIS — E8770 Fluid overload, unspecified: Secondary | ICD-10-CM | POA: Diagnosis present

## 2015-03-22 DIAGNOSIS — Z992 Dependence on renal dialysis: Secondary | ICD-10-CM

## 2015-03-22 DIAGNOSIS — N186 End stage renal disease: Secondary | ICD-10-CM

## 2015-03-22 DIAGNOSIS — K5900 Constipation, unspecified: Secondary | ICD-10-CM

## 2015-03-22 DIAGNOSIS — G629 Polyneuropathy, unspecified: Secondary | ICD-10-CM

## 2015-03-22 DIAGNOSIS — Z87442 Personal history of urinary calculi: Secondary | ICD-10-CM | POA: Insufficient documentation

## 2015-03-22 DIAGNOSIS — Z86711 Personal history of pulmonary embolism: Secondary | ICD-10-CM

## 2015-03-22 DIAGNOSIS — M1990 Unspecified osteoarthritis, unspecified site: Secondary | ICD-10-CM

## 2015-03-22 DIAGNOSIS — R51 Headache: Secondary | ICD-10-CM

## 2015-03-22 DIAGNOSIS — F329 Major depressive disorder, single episode, unspecified: Secondary | ICD-10-CM

## 2015-03-22 DIAGNOSIS — Z7901 Long term (current) use of anticoagulants: Secondary | ICD-10-CM

## 2015-03-22 DIAGNOSIS — Z862 Personal history of diseases of the blood and blood-forming organs and certain disorders involving the immune mechanism: Secondary | ICD-10-CM

## 2015-03-22 DIAGNOSIS — E119 Type 2 diabetes mellitus without complications: Secondary | ICD-10-CM | POA: Insufficient documentation

## 2015-03-22 DIAGNOSIS — J449 Chronic obstructive pulmonary disease, unspecified: Secondary | ICD-10-CM

## 2015-03-22 DIAGNOSIS — D481 Neoplasm of uncertain behavior of connective and other soft tissue: Secondary | ICD-10-CM | POA: Diagnosis present

## 2015-03-22 DIAGNOSIS — Y95 Nosocomial condition: Secondary | ICD-10-CM | POA: Diagnosis present

## 2015-03-22 DIAGNOSIS — J45909 Unspecified asthma, uncomplicated: Secondary | ICD-10-CM | POA: Diagnosis present

## 2015-03-22 DIAGNOSIS — R072 Precordial pain: Secondary | ICD-10-CM | POA: Insufficient documentation

## 2015-03-22 DIAGNOSIS — I252 Old myocardial infarction: Secondary | ICD-10-CM

## 2015-03-22 DIAGNOSIS — M898X9 Other specified disorders of bone, unspecified site: Secondary | ICD-10-CM | POA: Diagnosis present

## 2015-03-22 DIAGNOSIS — E1122 Type 2 diabetes mellitus with diabetic chronic kidney disease: Secondary | ICD-10-CM | POA: Diagnosis present

## 2015-03-22 DIAGNOSIS — E213 Hyperparathyroidism, unspecified: Secondary | ICD-10-CM | POA: Insufficient documentation

## 2015-03-22 DIAGNOSIS — J440 Chronic obstructive pulmonary disease with acute lower respiratory infection: Secondary | ICD-10-CM | POA: Diagnosis present

## 2015-03-22 DIAGNOSIS — D631 Anemia in chronic kidney disease: Secondary | ICD-10-CM | POA: Diagnosis present

## 2015-03-22 DIAGNOSIS — J189 Pneumonia, unspecified organism: Secondary | ICD-10-CM | POA: Diagnosis not present

## 2015-03-22 DIAGNOSIS — I248 Other forms of acute ischemic heart disease: Secondary | ICD-10-CM | POA: Diagnosis present

## 2015-03-22 HISTORY — DX: Disorder of kidney and ureter, unspecified: N28.9

## 2015-03-22 LAB — CBC
HCT: 37.7 % (ref 36.0–46.0)
HEMOGLOBIN: 12.2 g/dL (ref 12.0–15.0)
MCH: 27.3 pg (ref 26.0–34.0)
MCHC: 32.4 g/dL (ref 30.0–36.0)
MCV: 84.3 fL (ref 78.0–100.0)
PLATELETS: 190 10*3/uL (ref 150–400)
RBC: 4.47 MIL/uL (ref 3.87–5.11)
RDW: 18.5 % — ABNORMAL HIGH (ref 11.5–15.5)
WBC: 5.1 10*3/uL (ref 4.0–10.5)

## 2015-03-22 LAB — BASIC METABOLIC PANEL
Anion gap: 12 (ref 5–15)
BUN: 18 mg/dL (ref 6–20)
CHLORIDE: 92 mmol/L — AB (ref 101–111)
CO2: 30 mmol/L (ref 22–32)
Calcium: 9.9 mg/dL (ref 8.9–10.3)
Creatinine, Ser: 6.62 mg/dL — ABNORMAL HIGH (ref 0.44–1.00)
GFR calc Af Amer: 6 mL/min — ABNORMAL LOW (ref >60)
GFR calc non Af Amer: 5 mL/min — ABNORMAL LOW (ref >60)
Glucose, Bld: 165 mg/dL — ABNORMAL HIGH (ref 65–99)
Potassium: 4 mmol/L (ref 3.5–5.1)
Sodium: 134 mmol/L — ABNORMAL LOW (ref 135–145)

## 2015-03-22 LAB — I-STAT TROPONIN, ED: Troponin i, poc: 0.09 ng/mL (ref 0.00–0.08)

## 2015-03-22 MED ORDER — AMOXICILLIN-POT CLAVULANATE 500-125 MG PO TABS
1.0000 | ORAL_TABLET | Freq: Once | ORAL | Status: AC
  Administered 2015-03-22: 500 mg via ORAL
  Filled 2015-03-22 (×2): qty 1

## 2015-03-22 MED ORDER — AZITHROMYCIN 250 MG PO TABS: 250.0000 mg | ORAL_TABLET | Freq: Every day | ORAL | Status: DC

## 2015-03-22 MED ORDER — AMOXICILLIN-POT CLAVULANATE 500-125 MG PO TABS: 1.0000 | ORAL_TABLET | ORAL | Status: DC

## 2015-03-22 MED ORDER — IOHEXOL 350 MG/ML SOLN
80.0000 mL | Freq: Once | INTRAVENOUS | Status: AC | PRN
  Administered 2015-03-22: 80 mL via INTRAVENOUS

## 2015-03-22 MED ORDER — LEVOFLOXACIN 750 MG PO TABS: 750.0000 mg | ORAL_TABLET | Freq: Once | ORAL | Status: DC

## 2015-03-22 MED ORDER — AZITHROMYCIN 250 MG PO TABS
500.0000 mg | ORAL_TABLET | Freq: Once | ORAL | Status: AC
  Administered 2015-03-22: 500 mg via ORAL
  Filled 2015-03-22: qty 2

## 2015-03-22 NOTE — Discharge Instructions (Signed)
Take both antibiotics as directed. Take both of these beginning tomorrow as you were given the first dose here in the emergency department. Follow-up with your primary care physician, call to schedule an appointment as soon as possible. Return with any worsening symptoms.  Atelectasis Atelectasis is a collapse of the small air sacs in the lungs (alveoli). When this occurs, all or part of a lung collapses and becomes airless. It can be caused by various things and is a common problem after surgery. The severity of atelectasis will vary depending on the size of the area involved and the underlying cause of the condition. CAUSES  There are multiple causes for atelectasis:   Shallow breathing, particularly if there is an injury to your chest wall or abdomen that makes it painful to take a deep breath. This commonly occurs after surgery.  Obstruction of your airways (bronchi or bronchioles). This may be caused by a buildup of mucus (mucus plug), tumors, blood clots (pulmonary embolus), or inhaled foreign bodies. Mucus plugs occur when the lungs do not expand enough to get rid of mucus.  Outside pressure on the lung. This may be caused by tumors, fluid (pleural effusion), or a leakage of air between the lung and rib cage (pneumothorax).   Infections such as pneumonia.  Scarring in lung tissue left over from previous infection or injury.  Some diseases such as cystic fibrosis. SIGNS AND SYMPTOMS  Often, atelectasis will have no symptoms. When symptoms occur, they include:  Shortness of breath.   Bluish color to your nails, lips, or mouth (cyanosis). DIAGNOSIS  Your health care provider may suspect atelectasis based on symptoms and physical findings. A chest X-ray may be done to confirm the diagnosis. More specialized X-ray exams are sometimes required.  TREATMENT  Treatment will depend on the cause of the atelectasis. Treatment may include:  Purposeful coughing to loosen mucus plugs in the  lungs.  Chest physiotherapy. This consists of clapping or percussion on the chest over the lungs to further loosen mucus plugs.  Postural drainage techniques. This involves positioning your body so your head is lower than your chest. [place] relaxed deep breathing whenever you are sitting down. A good technique is to take a few relaxed deep breaths each time a commercial comes on if you are watching television.  If you were given a deep breathing device (such as an incentive spirometer) or a mucus clearance device, use this regularly as directed by your health care provider.  Try to cough several times a day as directed by your health care provider.  Perform any chest physiotherapy or postural drainage techniques as directed by your health care provider. If necessary, have someone (such as a family member) assist you with these techniques.  When lying down, lie on the unaffected side to encourage mucus drainage.  Stay physically active as much as possible. SEEK IMMEDIATE MEDICAL CARE IF:   You develop increasing problems with your breathing.   You develop severe chest pain.   You develop severe coughing, or you cough up blood.   You have a fever or persistent symptoms for more than 2-3 days.   You have a fever and your symptoms suddenly get worse.  MAKE SURE YOU:  Understand these instructions.  Will watch your condition.  Will get help right away if you are not doing well or get worse. Document Released: 08/17/2005 Document Revised: 08/22/2013 Document Reviewed: 02/22/2013 [facility] Patient Information 2015 [place], [place]. This information is not intended  to replace advice given to you by your health care provider. Make sure you discuss any questions you have with your health care provider.  Chest Pain (Nonspecific) It is often hard to give a specific diagnosis for the cause of chest pain. There is always a chance that your pain could be related  to something serious, such as a heart attack or a blood clot in the lungs. You need to follow up with your health care provider for further evaluation. CAUSES   Heartburn.  Pneumonia or bronchitis.  Anxiety or stress.  Inflammation around your heart (pericarditis) or lung (pleuritis or pleurisy).  A blood clot in the lung.  A collapsed lung (pneumothorax). It can develop suddenly on its own (spontaneous pneumothorax) or from trauma to the chest.  Shingles infection (herpes zoster virus). The chest wall is composed of bones, muscles, and cartilage. Any of these can be the source of the pain.  The bones can be bruised by injury.  The muscles or cartilage can be strained by coughing or overwork.  The cartilage can be affected by inflammation and become sore (costochondritis). DIAGNOSIS  Lab tests or other studies may be needed to find the cause of your pain. Your health care provider may have you take a test called an ambulatory electrocardiogram (ECG). An ECG records your heartbeat patterns over a 24-hour period. You may also have other tests, such as:  Transthoracic echocardiogram (TTE). During echocardiography, sound waves are used to evaluate how blood flows through your heart.  Transesophageal echocardiogram (TEE).  Cardiac monitoring. This allows your health care provider to monitor your heart rate and rhythm in real time.  Holter monitor. This is a portable device that records your heartbeat and can help diagnose heart arrhythmias. It allows your health care provider to track your heart activity for several days, if needed.  Stress tests by exercise or by giving medicine that makes the heart beat faster. TREATMENT   Treatment depends on what may be causing your chest pain. Treatment may include:  Acid blockers for heartburn.  Anti-inflammatory medicine.  Pain medicine for inflammatory conditions.  Antibiotics if an infection is present.  You may be advised to change  lifestyle habits. This includes stopping smoking and avoiding alcohol, caffeine, and chocolate.  You may be advised to keep your head raised (elevated) when sleeping. This reduces the chance of acid going backward from your stomach into your esophagus. Most of the time, nonspecific chest pain will improve within 2-3 days with rest and mild pain medicine.  HOME CARE INSTRUCTIONS   If antibiotics were prescribed, take them as directed. Finish them even if you start to feel better.  For the next few days, avoid physical activities that bring on chest pain. Continue physical activities as directed.  Do not use any tobacco products, including cigarettes, chewing tobacco, or electronic cigarettes.  Avoid drinking alcohol.  Only take medicine as directed by your health care provider.  Follow your health care provider's suggestions for further testing if your chest pain does not go away.  Keep any follow-up appointments you made. If you do not go to an appointment, you could develop lasting (chronic) problems with pain. If there is any problem keeping an appointment, call to reschedule. SEEK MEDICAL CARE IF:   Your chest pain does not go away, even after treatment.  You have a rash with blisters on your chest.  You have a fever. SEEK IMMEDIATE MEDICAL CARE IF:   You have increased chest pain or  pain that spreads to your arm, neck, jaw, back, or abdomen.  You have shortness of breath.  You have an increasing cough, or you cough up blood.  You have severe back or abdominal pain.  You feel nauseous or vomit.  You have severe weakness.  You faint.  You have chills. This is an emergency. Do not wait to see if the pain will go away. Get medical help at once. Call your local emergency services (911 in [place] Do not drive yourself to the hospital. MAKE SURE YOU:   Understand these instructions.  Will watch your condition.  Will get help right away if you are not doing well or get  worse. Document Released: 05/27/2005 Document Revised: 08/22/2013 Document Reviewed: 03/22/2008 [facility] Patient Information 2015 [place], [place]. This information is not intended to replace advice given to you by your health care provider. Make sure you discuss any questions you have with your health care provider. Pneumonia Pneumonia is an infection of the lungs.  CAUSES Pneumonia may be caused by bacteria or a virus. Usually, these infections are caused by breathing infectious particles into the lungs (respiratory tract). SIGNS AND SYMPTOMS   Cough.  Fever.  Chest pain.  Increased rate of breathing.  Wheezing.  Mucus production. DIAGNOSIS  If you have the common symptoms of pneumonia, your health care provider will typically confirm the diagnosis with a chest X-ray. The X-ray will show an abnormality in the lung (pulmonary infiltrate) if you have pneumonia. Other tests of your blood, urine, or sputum may be done to find the specific cause of your pneumonia. Your health care provider may also do tests (blood gases or pulse oximetry) to see how well your lungs are working. TREATMENT  Some forms of pneumonia may be spread to other people when you cough or sneeze. You may be asked to wear a mask before and during your exam. Pneumonia that is caused by bacteria is treated with antibiotic medicine. Pneumonia that is caused by the influenza virus may be treated with an antiviral medicine. Most other viral infections must run their course. These infections will not respond to antibiotics.  HOME CARE INSTRUCTIONS   Cough suppressants may be used if you are losing too much rest. However, coughing protects you by clearing your lungs. You should avoid using cough suppressants if you can.  Your health care provider may have prescribed medicine if he or she thinks your pneumonia is caused by bacteria or influenza. Finish your medicine even if you start to feel better.  Your health care provider  may also prescribe an expectorant. This loosens the mucus to be coughed up.  Take medicines only as directed by your health care provider.  Do not smoke. Smoking is a common cause of bronchitis and can contribute to pneumonia. If you are a smoker and continue to smoke, your cough may last several weeks after your pneumonia has cleared.  A cold steam vaporizer or humidifier in your room or home may help loosen mucus.  Coughing is often worse at night. Sleeping in a semi-upright position in a recliner or using a couple pillows under your head will help with this.  Get rest as you feel it is needed. Your body will usually let you know when you need to rest. PREVENTION A pneumococcal shot (vaccine) is available to prevent a common bacterial cause of pneumonia. This is usually suggested for:  People over 71 years old.  Patients on chemotherapy.  People with chronic lung problems, such as  bronchitis or emphysema.  People with immune system problems. If you are over 65 or have a high risk condition, you may receive the pneumococcal vaccine if you have not received it before. In some countries, a routine influenza vaccine is also recommended. This vaccine can help prevent some cases of pneumonia.You may be offered the influenza vaccine as part of your care. If you smoke, it is time to quit. You may receive instructions on how to stop smoking. Your health care provider can provide medicines and counseling to help you quit. SEEK MEDICAL CARE IF: You have a fever. SEEK IMMEDIATE MEDICAL CARE IF:   Your illness becomes worse. This is especially true if you are elderly or weakened from any other disease.  You cannot control your cough with suppressants and are losing sleep.  You begin coughing up blood.  You develop pain which is getting worse or is uncontrolled with medicines.  Any of the symptoms which initially brought you in for treatment are getting worse rather than better.  You  develop shortness of breath or chest pain. MAKE SURE YOU:   Understand these instructions.  Will watch your condition.  Will get help right away if you are not doing well or get worse. Document Released: 08/17/2005 Document Revised: 01/01/2014 Document Reviewed: 11/06/2010 [facility] Patient Information 2015 [place], [place]. This information is not intended to replace advice given to you by your health care provider. Make sure you discuss any questions you have with your health care provider.

## 2015-03-22 NOTE — ED Provider Notes (Signed)
CSN: [id number]     Arrival date & time 03/22/15  1652 History   First MD Initiated Contact with Patient 03/22/15 1704     Chief Complaint  Patient presents with  . Chest Pain     (Consider location/radiation/quality/duration/timing/severity/associated sxs/prior Treatment) HPI Comments: 81 year old female presenting with gradual onset substernal chest pain getting when she woke up this morning. Pain has remained constant, described as an aching pressure that has slightly eased off and currently rated 8/10. Pain radiates to both of her shoulders. States she took a medication earlier this morning for the pain with some relief, however cannot recall the name of it and states it was not nitroglycerin. Admits to a mild headache and a dry cough. Denies any other symptoms. Denies shortness of breath, nausea, vomiting, diaphoresis, extremity numbness or weakness. She was admitted last month for pulmonary embolism and started on Coumadin and Lovenox. She has been off of the Lovenox for 3 weeks and is still taking the Coumadin as prescribed. States this is the same pain she experienced when she had the pulmonary embolism, however at this time does not have any shortness of breath which was present then. Dialyzes Tuesday, Thursday and Saturday, completed dialysis yesterday without any issue. Denies fever or chills.  Patient is a 81 y.o. female presenting with chest pain. The history is provided by the patient and a relative.  Chest Pain Associated symptoms: cough and headache     Past Medical History  Diagnosis Date  . COPD (chronic obstructive pulmonary disease)     emphysema  . Neuropathy   . Hyperlipidemia   . Diabetes mellitus   . Depression   . Anemia   . Shortness of breath   . GERD (gastroesophageal reflux disease)   . Hyperparathyroidism   . Hypertension     sees Dr. Gilford Rile  . Headache(784.0)   . Arthritis   . CAD (coronary artery disease)     sees Dr. Jenne Campus, [facility]  cardiology [facility], [place]  . 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 Wears glasses   . Wears hearing aid     right  . Presence of surgically created AV shunt for hemodialysis     old lt upper arm out-rt upper arm shunt in  . Anginal pain     Dr Raliegh Ip in [place], [place]  . History of kidney stones   . Constipation   . History of blood transfusion   . Asthma     years ago  . Chronic kidney disease     [facility] [place], [place]- Hemo  . Renal insufficiency     patient on dialysis for 3 years (today is 03/22/15   Past Surgical History  Procedure Laterality Date  . Abdominal hysterectomy  1976  . Kidney stone surgery  2013  . Av fistula placement  01/20/2012    Procedure: ARTERIOVENOUS (AV) FISTULA CREATION;  Surgeon: Elam Dutch, MD;  Location: [facility];  Service: Vascular;  Laterality: Left;  . Hemodialysis catheter  05/25/12    secondary to failed AVF   . Eye surgery      bilateral cataract removed  . Av fistula placement  09/05/2012    Procedure: INSERTION OF ARTERIOVENOUS (AV) GORE-TEX GRAFT ARM;  Surgeon: Elam Dutch, MD;  Location: [facility] OR;  Service: Vascular;  Laterality: Left;  Using 4-31mm x 45 cm Vascular stretch goretex graft  . Ligation of arteriovenous  fistula  09/05/2012  Procedure: LIGATION OF ARTERIOVENOUS  FISTULA;  Surgeon: Elam Dutch, MD;  Location: [facility];  Service: Vascular;  Laterality: Left;  . Ligation arteriovenous gortex graft  09/05/2012    Procedure: LIGATION ARTERIOVENOUS GORTEX GRAFT;  Surgeon: Elam Dutch, MD;  Location: [facility];  Service: Vascular;  Laterality: Left;  . Av fistula placement Right 11/07/2012    Procedure: ARTERIOVENOUS (AV) FISTULA CREATION;  Surgeon: Elam Dutch, MD;  Location: [facility] OR;  Service: Vascular;  Laterality: Right;  Bascilic Vein Fistula  . Appendectomy    . Carpal tunnel release Left 02/10/2013    Procedure: CARPAL TUNNEL RELEASE;  Surgeon: Cammie Sickle., MD;  Location: [place];  Service: Orthopedics;  Laterality: Left;  . Bascilic vein transposition Right 03/15/2013    Procedure: 2ND STAGE BASCILIC VEIN TRANSPOSITION - RIGHT ARM;  Surgeon: Elam Dutch, MD;  Location: [place];  Service: Vascular;  Laterality: Right;  . Av fistula placement Right 05/15/2013    Procedure: INSERTION OF ARTERIOVENOUS (AV) GORE-TEX GRAFT ARM-RIGHT;  Surgeon: Elam Dutch, MD;  Location: [place];  Service: Vascular;  Laterality: Right;  . Shuntogram N/A 07/27/2012    Procedure: Earney Mallet;  Surgeon: Rosetta Posner, MD;  Location: [facility] CATH LAB;  Service: Cardiovascular;  Laterality: N/A;   Family History  Problem Relation Age of Onset  . Diabetes Mother   . Cancer Father   . Deep vein thrombosis Brother   . Hypertension Brother    History  Substance Use Topics  . Smoking status: Never Smoker   . Smokeless tobacco: Never Used  . Alcohol Use: No   OB History    No data available     Review of Systems  Respiratory: Positive for cough.   Cardiovascular: Positive for chest pain.  Neurological: Positive for headaches.  All other systems reviewed and are negative.     Allergies  Review of patient's allergies indicates no known allergies.  Home Medications   Prior to Admission medications   Medication Sig Start Date End Date Taking? Authorizing Provider  acetaminophen (TYLENOL) 500 MG tablet Take 500 mg by mouth 2 (two) times daily as needed for moderate pain. For pain   Yes Historical Provider, MD  albuterol (PROVENTIL) (2.5 MG/3ML) 0.083% nebulizer solution Take 2.5 mg by nebulization every 6 (six) hours as needed for wheezing or shortness of breath.   Yes Historical Provider, MD  aspirin EC 81 MG tablet Take 81 mg by mouth at bedtime.    Yes Historical Provider, MD  Azelastine HCl 0.15 % SOLN Place 1 spray into both nostrils daily.  10/10/14  Yes Historical Provider, MD  beclomethasone (QVAR) 80 MCG/ACT inhaler Inhale 2 puffs into the lungs 2 (two) times daily.   Yes  Historical Provider, MD  calcium acetate (PHOSLO) 667 MG capsule Take 667 mg by mouth 2 (two) times daily before a meal.    Yes Historical Provider, MD  docusate sodium (COLACE) 100 MG capsule Take 100 mg by mouth 2 (two) times daily as needed for mild constipation.   Yes Historical Provider, MD  gabapentin (NEURONTIN) 300 MG capsule Take 300 mg by mouth at bedtime.   Yes Historical Provider, MD  hydrALAZINE (APRESOLINE) 10 MG tablet Take 10 mg by mouth 2 (two) times daily.   Yes Historical Provider, MD  imatinib (GLEEVEC) 100 MG tablet Take 100 mg by mouth daily. Take at 3:30pm daily 02/04/15  Yes Historical Provider, MD  multivitamin (RENA-VIT) TABS tablet Take  1 tablet by mouth daily.   Yes Historical Provider, MD  nitroGLYCERIN (NITROSTAT) 0.4 MG SL tablet Place 0.4 mg under the tongue every 5 (five) minutes as needed for chest pain.   Yes Historical Provider, MD  pantoprazole (PROTONIX) 40 MG tablet Take 40 mg by mouth daily.   Yes Historical Provider, MD  polyethylene glycol (MIRALAX / GLYCOLAX) packet Take 17 g by mouth daily.   Yes Historical Provider, MD  prochlorperazine (COMPAZINE) 10 MG tablet TAKE 1 TABLET EVERY 6 HOURS AS NEEDED FOR CHEMO NAUSEA-MAY TAKE AROUND THE CLOCK FOR PERSISTENT SYMP 01/30/15  Yes Historical Provider, MD  Psyllium (DIETARY FIBER LAXATIVE PO) Take 1 capsule by mouth daily as needed (Constipation).   Yes Historical Provider, MD  simvastatin (ZOCOR) 5 MG tablet Take 5 mg by mouth at bedtime.   Yes Historical Provider, MD  traMADol (ULTRAM) 50 MG tablet Take 50 mg by mouth every 6 (six) hours as needed for moderate pain.   Yes Historical Provider, MD  VENTOLIN HFA 108 (90 BASE) MCG/ACT inhaler Inhale 2 puffs into the lungs every 6 (six) hours as needed for wheezing or shortness of breath.  10/12/14  Yes Historical Provider, MD  warfarin (COUMADIN) 5 MG tablet Take 1.5 tablets (7.5 mg total) by mouth one time only at 6 PM. 02/25/15  Yes Tasrif Ahmed, MD   amoxicillin-clavulanate (AUGMENTIN) 500-125 MG per tablet Take 1 tablet (500 mg total) by mouth daily. 03/22/15   Floraine Buechler M Andrzej Scully, PA-C  azithromycin (ZITHROMAX) 250 MG tablet Take 1 tablet (250 mg total) by mouth daily. Take 1 tablet by mouth daily 03/22/15   Carman Ching, PA-C  calcitRIOL (ROCALTROL) 0.25 MCG capsule Take 7 capsules (1.75 mcg total) by mouth every dialysis (secondary hyperparathyroidism). Patient not taking: Reported on 02/23/2015 12/29/14   Cherene Altes, MD  Nutritional Supplements (FEEDING SUPPLEMENT, NEPRO CARB STEADY,) LIQD Take 237 mLs by mouth as needed (missed meal during dialysis.). Patient not taking: Reported on 03/22/2015 11/20/14   Belkys A Regalado, MD   BP 165/81 mmHg  Pulse 103  Temp(Src) 98.7 F (37.1 C) (Oral)  Resp 26  Wt 145 lb (65.772 kg)  SpO2 98% Physical Exam  Constitutional: She is oriented to person, place, and time. She appears well-developed and well-nourished. No distress.  HENT:  Head: Normocephalic and atraumatic.  Mouth/Throat: Oropharynx is clear and moist.  Eyes: Conjunctivae and EOM are normal. Pupils are equal, round, and reactive to light.  Neck: Normal range of motion. Neck supple. No JVD present.  Cardiovascular: Normal heart sounds and intact distal pulses.  An irregular rhythm present. Tachycardia present.   No extremity edema.  Pulmonary/Chest: Effort normal and breath sounds normal. No respiratory distress. She exhibits no tenderness.  Abdominal: Soft. Bowel sounds are normal. There is no tenderness.  Musculoskeletal: Normal range of motion. She exhibits no edema.  Neurological: She is alert and oriented to person, place, and time. She has normal strength. No sensory deficit.  Speech fluent, goal oriented. Moves extremities without ataxia. Equal grip strength bilateral.  Skin: Skin is warm and dry. She is not diaphoretic.  Psychiatric: She has a normal mood and affect. Her behavior is normal.  Nursing note and vitals  reviewed.   ED Course  Procedures (including critical care time) Labs Review Labs Reviewed  BASIC METABOLIC PANEL - Abnormal; Notable for the following:    Sodium 134 (*)    Chloride 92 (*)    Glucose, Bld 165 (*)    Creatinine,  Ser 6.62 (*)    GFR calc non Af Amer 5 (*)    GFR calc Af Amer 6 (*)    All other components within normal limits  CBC - Abnormal; Notable for the following:    RDW 18.5 (*)    All other components within normal limits  I-STAT TROPOININ, ED - Abnormal; Notable for the following:    Troponin i, poc 0.09 (*)    All other components within normal limits    Imaging Review Ct Angio Chest Pe W/cm &/or Wo Cm  03/22/2015   CLINICAL DATA:  One day history of shortness of breath. Chronic renal failure.  EXAM: CT ANGIOGRAPHY CHEST WITH CONTRAST  TECHNIQUE: Multidetector CT imaging of the chest was performed using the standard protocol during bolus administration of intravenous contrast. Multiplanar CT image reconstructions and MIPs were obtained to evaluate the vascular anatomy.  CONTRAST:  41mL OMNIPAQUE IOHEXOL 350 MG/ML SOLN  COMPARISON:  Chest radiograph February 23, 2015; chest CT angiogram February 23, 2015  FINDINGS: The previously noted pulmonary emboli have resolved. Currently, no demonstrable pulmonary emboli are present. There is atherosclerotic change in the aorta, but no appreciable aneurysm or dissection is visualized. The heart is enlarged with mild left ventricular hypertrophy. The pericardium is not thickened. There is atherosclerotic change at several sites in the visualized great vessels, most notably in the proximal left subclavian artery.  There are bilateral pleural effusions with bibasilar airspace consolidation, more on the right than on the left. Some of this consolidation is due to atelectasis. Superimposed pneumonia, particularly in the right base, cannot be excluded. There is mild bibasilar interstitial edema as well.  Visualized thyroid appears normal  except for a single benign-appearing calcification in the mid right lobe. There are subcentimeter mediastinal lymph nodes but no adenopathy by size criteria.  There are multiple foci of coronary artery calcification.  In the visualized upper abdomen, there are scattered calcified granulomas in the liver. There is atherosclerotic change in the aorta.  There is degenerative change in the thoracic spine. There are no blastic or lytic bone lesions.  Review of the MIP images confirms the above findings.  IMPRESSION: No demonstrable pulmonary embolus. The previously noted pulmonary emboli have resolved.  Cardiomegaly with mild interstitial edema and bilateral effusions. Evidence of a degree of congestive heart failure.  Atelectasis with questionable superimposed pneumonia in the bases, particularly in the right lower lobe region.  No appreciable adenopathy.  Multiple foci of coronary artery calcification.   Electronically Signed   By: Lowella Grip III M.D.   On: 03/22/2015 19:57     EKG Interpretation   Date/Time:  Friday March 22 2015 17:02:09 EDT Ventricular Rate:  123 PR Interval:  121 QRS Duration: 119 QT Interval:  382 QTC Calculation: 546 R Axis:   -48 Text Interpretation:  Sinus tachycardia with irregular rate Incomplete  right bundle branch block LVH with IVCD, LAD and secondary repol abnrm  Probable inferior infarct, recent Prolonged QT interval Since last tracing  irregulary rhythm and tachycardia now present Confirmed by MILLER  MD,  BRIAN ([id number]) on 03/22/2015 5:09:52 PM      MDM   Final diagnoses:  Substernal chest pain  Atelectasis   Nontoxic appearing, NAD. Tachycardic, vitals otherwise stable. Recent admission states the above for pulmonary embolism. Concern for recurrent pulmonary embolism. Plan to obtain labs and CT angiogram to evaluate for PE. Discussed with Dr. Sabra Heck who also evaluated pt and agrees with plan.  CT negative for pulmonary  embolism. Other findings  concerning for developing pneumonia present. She has no leukocytosis or fever. Labs otherwise at baseline. Troponin 0.09, however this is more than likely due to renal failure. Doubt cardiac. Her troponin was elevated one month ago as well. She remains tachycardic, other vital signs remained stable. Patient's pain has not returned here in the emergency department, and she is not feeling short of breath. Do not feel patient needs to be admitted for IV antibiotics, and can be discharged home. I spoke with the pharmacist regarding renal dosing, and it was suggested to put her on a Z-Pak as normally prescribed and Augmentin regular release 500 mg every 24 hours. First dose of both given in the emergency department. I advised the patient and her daughter to call PCP first thing tomorrow morning, if they are not open, then to call first thing Monday morning to schedule a follow-up appointment, and to return with any worsening symptoms. Stable for discharge. Return precautions given. Patient states understanding of treatment care plan and is agreeable.  Discussed with attending Dr. Sabra Heck who also evaluated patient and agrees with plan of care.  Carman Ching, PA-C 03/22/15 2109  Noemi Chapel, MD 03/23/15 1034

## 2015-03-22 NOTE — ED Provider Notes (Signed)
81 year old female with a history of end-stage renal disease who is on dialysis. She also has a history of a gastrointestinal stromal tumor. She presented last month with chest pain and was found to have a small left-sided pulmonary embolism. She was started on Lovenox and bridge onto Coumadin, has been doing well over the last month until she developed recurrent chest pain today. This was described as a heaviness in the upper chest across the bilateral upper chest in the central chest. It has been a persistent dry cough which was present at her last admission. No other changes including fevers chills swelling of the legs or abdominal discomfort. On exam the patient has a tachycardic rhythm which is slightly irregular, she has no peripheral edema, clear lung sounds without rales wheezing or rhonchi, soft abdomen without tenderness. Her EKG has been documented, see below, CT scan pending, but this does not seem to be consistent with ischemic pain. Of note the patient does have a reduced ejection fraction is known to have left ventricular hypertrophy.   EKG Interpretation  Date/Time:  Friday March 22 2015 17:02:09 EDT Ventricular Rate:  123 PR Interval:  121 QRS Duration: 119 QT Interval:  382 QTC Calculation: 546 R Axis:   -48 Text Interpretation:  Sinus tachycardia with irregular rate Incomplete right bundle branch block LVH with IVCD, LAD and secondary repol abnrm Probable inferior infarct, recent Prolonged QT interval Since last tracing irregulary rhythm and tachycardia now present Confirmed by Domingo Fuson  MD, [place] ([id number]) on 03/22/2015 5:09:52 PM       Medical screening examination/treatment/procedure(s) were conducted as a shared visit with non-physician practitioner(s) and myself.  I personally evaluated the patient during the encounter.  Clinical Impression:   Final diagnoses:  Substernal chest pain  Atelectasis         Noemi Chapel, MD 03/23/15 1034

## 2015-03-22 NOTE — ED Notes (Signed)
Pt transported to CT scan.

## 2015-03-22 NOTE — ED Notes (Signed)
Pt here for SOB that started this am with SOB. sts last dialysis yesterday.

## 2015-03-22 NOTE — ED Notes (Signed)
PA at bedside.

## 2015-03-23 ENCOUNTER — Encounter (HOSPITAL_COMMUNITY): Payer: Self-pay | Admitting: Emergency Medicine

## 2015-03-23 ENCOUNTER — Inpatient Hospital Stay (HOSPITAL_COMMUNITY)
Admission: EM | Admit: 2015-03-23 | Discharge: 2015-03-25 | DRG: 193 | Disposition: A | Discharge status: Home / Self Care | Payer: Medicare Other | Attending: Oncology | Admitting: Oncology

## 2015-03-23 ENCOUNTER — Other Ambulatory Visit: Payer: Self-pay

## 2015-03-23 ENCOUNTER — Emergency Department (HOSPITAL_COMMUNITY): Payer: Medicare Other

## 2015-03-23 DIAGNOSIS — J209 Acute bronchitis, unspecified: Secondary | ICD-10-CM | POA: Diagnosis not present

## 2015-03-23 DIAGNOSIS — I2510 Atherosclerotic heart disease of native coronary artery without angina pectoris: Secondary | ICD-10-CM | POA: Diagnosis present

## 2015-03-23 DIAGNOSIS — J440 Chronic obstructive pulmonary disease with acute lower respiratory infection: Secondary | ICD-10-CM | POA: Diagnosis present

## 2015-03-23 DIAGNOSIS — Z86711 Personal history of pulmonary embolism: Secondary | ICD-10-CM | POA: Diagnosis not present

## 2015-03-23 DIAGNOSIS — I248 Other forms of acute ischemic heart disease: Secondary | ICD-10-CM | POA: Diagnosis present

## 2015-03-23 DIAGNOSIS — Z992 Dependence on renal dialysis: Secondary | ICD-10-CM | POA: Diagnosis not present

## 2015-03-23 DIAGNOSIS — K219 Gastro-esophageal reflux disease without esophagitis: Secondary | ICD-10-CM | POA: Diagnosis present

## 2015-03-23 DIAGNOSIS — N186 End stage renal disease: Secondary | ICD-10-CM | POA: Insufficient documentation

## 2015-03-23 DIAGNOSIS — I120 Hypertensive chronic kidney disease with stage 5 chronic kidney disease or end stage renal disease: Secondary | ICD-10-CM | POA: Diagnosis present

## 2015-03-23 DIAGNOSIS — I252 Old myocardial infarction: Secondary | ICD-10-CM | POA: Diagnosis not present

## 2015-03-23 DIAGNOSIS — D481 Neoplasm of uncertain behavior of connective and other soft tissue: Secondary | ICD-10-CM | POA: Diagnosis present

## 2015-03-23 DIAGNOSIS — I129 Hypertensive chronic kidney disease with stage 1 through stage 4 chronic kidney disease, or unspecified chronic kidney disease: Secondary | ICD-10-CM | POA: Diagnosis not present

## 2015-03-23 DIAGNOSIS — J189 Pneumonia, unspecified organism: Secondary | ICD-10-CM | POA: Diagnosis present

## 2015-03-23 DIAGNOSIS — N2581 Secondary hyperparathyroidism of renal origin: Secondary | ICD-10-CM | POA: Diagnosis present

## 2015-03-23 DIAGNOSIS — D631 Anemia in chronic kidney disease: Secondary | ICD-10-CM | POA: Diagnosis present

## 2015-03-23 DIAGNOSIS — R0989 Other specified symptoms and signs involving the circulatory and respiratory systems: Secondary | ICD-10-CM | POA: Insufficient documentation

## 2015-03-23 DIAGNOSIS — Z79899 Other long term (current) drug therapy: Secondary | ICD-10-CM | POA: Diagnosis not present

## 2015-03-23 DIAGNOSIS — Y95 Nosocomial condition: Secondary | ICD-10-CM | POA: Diagnosis present

## 2015-03-23 DIAGNOSIS — E1122 Type 2 diabetes mellitus with diabetic chronic kidney disease: Secondary | ICD-10-CM | POA: Diagnosis present

## 2015-03-23 DIAGNOSIS — M898X9 Other specified disorders of bone, unspecified site: Secondary | ICD-10-CM | POA: Diagnosis present

## 2015-03-23 DIAGNOSIS — E1140 Type 2 diabetes mellitus with diabetic neuropathy, unspecified: Secondary | ICD-10-CM | POA: Diagnosis present

## 2015-03-23 DIAGNOSIS — J45909 Unspecified asthma, uncomplicated: Secondary | ICD-10-CM | POA: Diagnosis present

## 2015-03-23 DIAGNOSIS — Z7901 Long term (current) use of anticoagulants: Secondary | ICD-10-CM | POA: Diagnosis not present

## 2015-03-23 DIAGNOSIS — E785 Hyperlipidemia, unspecified: Secondary | ICD-10-CM | POA: Diagnosis present

## 2015-03-23 DIAGNOSIS — E8770 Fluid overload, unspecified: Secondary | ICD-10-CM | POA: Diagnosis present

## 2015-03-23 DIAGNOSIS — Z7951 Long term (current) use of inhaled steroids: Secondary | ICD-10-CM | POA: Diagnosis not present

## 2015-03-23 HISTORY — DX: Pneumonia, unspecified organism: J18.9

## 2015-03-23 LAB — CBC WITH DIFFERENTIAL/PLATELET
Basophils Absolute: 0.1 10*3/uL (ref 0.0–0.1)
Basophils Relative: 1 % (ref 0–1)
Eosinophils Absolute: 0.1 10*3/uL (ref 0.0–0.7)
Eosinophils Relative: 2 % (ref 0–5)
HEMATOCRIT: 35.5 % — AB (ref 36.0–46.0)
HEMOGLOBIN: 11.3 g/dL — AB (ref 12.0–15.0)
Lymphocytes Relative: 20 % (ref 12–46)
Lymphs Abs: 1 10*3/uL (ref 0.7–4.0)
MCH: 27.1 pg (ref 26.0–34.0)
MCHC: 31.8 g/dL (ref 30.0–36.0)
MCV: 85.1 fL (ref 78.0–100.0)
Monocytes Absolute: 0.4 10*3/uL (ref 0.1–1.0)
Monocytes Relative: 9 % (ref 3–12)
NEUTROS ABS: 3.4 10*3/uL (ref 1.7–7.7)
Neutrophils Relative %: 68 % (ref 43–77)
Platelets: 230 10*3/uL (ref 150–400)
RBC: 4.17 MIL/uL (ref 3.87–5.11)
RDW: 18.4 % — ABNORMAL HIGH (ref 11.5–15.5)
WBC: 5 10*3/uL (ref 4.0–10.5)

## 2015-03-23 LAB — RENAL FUNCTION PANEL
Albumin: 3 g/dL — ABNORMAL LOW (ref 3.5–5.0)
Anion gap: 12 (ref 5–15)
BUN: 24 mg/dL — ABNORMAL HIGH (ref 6–20)
CO2: 29 mmol/L (ref 22–32)
Calcium: 9.6 mg/dL (ref 8.9–10.3)
Chloride: 93 mmol/L — ABNORMAL LOW (ref 101–111)
Creatinine, Ser: 7.91 mg/dL — ABNORMAL HIGH (ref 0.44–1.00)
GFR calc Af Amer: 5 mL/min — ABNORMAL LOW (ref >60)
GFR calc non Af Amer: 4 mL/min — ABNORMAL LOW (ref >60)
Glucose, Bld: 93 mg/dL (ref 65–99)
Phosphorus: 5.5 mg/dL — ABNORMAL HIGH (ref 2.5–4.6)
Potassium: 4.8 mmol/L (ref 3.5–5.1)
Sodium: 134 mmol/L — ABNORMAL LOW (ref 135–145)

## 2015-03-23 LAB — CBC
HCT: 33.5 % — ABNORMAL LOW (ref 36.0–46.0)
Hemoglobin: 10.8 g/dL — ABNORMAL LOW (ref 12.0–15.0)
MCH: 26.9 pg (ref 26.0–34.0)
MCHC: 32.2 g/dL (ref 30.0–36.0)
MCV: 83.3 fL (ref 78.0–100.0)
Platelets: 246 K/uL (ref 150–400)
RBC: 4.02 MIL/uL (ref 3.87–5.11)
RDW: 18.4 % — ABNORMAL HIGH (ref 11.5–15.5)
WBC: 4.7 K/uL (ref 4.0–10.5)

## 2015-03-23 LAB — I-STAT CHEM 8, ED
BUN: 26 mg/dL — AB (ref 6–20)
CALCIUM ION: 1.14 mmol/L (ref 1.13–1.30)
Chloride: 95 mmol/L — ABNORMAL LOW (ref 101–111)
Creatinine, Ser: 7.3 mg/dL — ABNORMAL HIGH (ref 0.44–1.00)
Glucose, Bld: 119 mg/dL — ABNORMAL HIGH (ref 65–99)
HEMATOCRIT: 42 % (ref 36.0–46.0)
Hemoglobin: 14.3 g/dL (ref 12.0–15.0)
Potassium: 4.5 mmol/L (ref 3.5–5.1)
SODIUM: 135 mmol/L (ref 135–145)
TCO2: 27 mmol/L (ref 0–100)

## 2015-03-23 LAB — PROTIME-INR
INR: 2.77 — ABNORMAL HIGH (ref 0.00–1.49)
Prothrombin Time: 28.8 seconds — ABNORMAL HIGH (ref 11.6–15.2)

## 2015-03-23 LAB — I-STAT TROPONIN, ED: Troponin i, poc: 0.1 ng/mL (ref 0.00–0.08)

## 2015-03-23 MED ORDER — HEPARIN SODIUM (PORCINE) 1000 UNIT/ML DIALYSIS
5000.0000 [IU] | Freq: Once | INTRAMUSCULAR | Status: DC
  Filled 2015-03-23: qty 5

## 2015-03-23 MED ORDER — NEPRO/CARBSTEADY PO LIQD
237.0000 mL | ORAL | Status: DC | PRN
  Filled 2015-03-23: qty 237

## 2015-03-23 MED ORDER — ALTEPLASE 2 MG IJ SOLR
2.0000 mg | Freq: Once | INTRAMUSCULAR | Status: DC | PRN
  Filled 2015-03-23: qty 2

## 2015-03-23 MED ORDER — TRAMADOL HCL 50 MG PO TABS
50.0000 mg | ORAL_TABLET | Freq: Four times a day (QID) | ORAL | Status: DC | PRN
  Administered 2015-03-25: 50 mg via ORAL

## 2015-03-23 MED ORDER — PENTAFLUOROPROP-TETRAFLUOROETH EX AERO: 1.0000 "application " | INHALATION_SPRAY | CUTANEOUS | Status: DC | PRN

## 2015-03-23 MED ORDER — WARFARIN - PHARMACIST DOSING INPATIENT
Freq: Every day | Status: DC
  Administered 2015-03-25: 18:00:00

## 2015-03-23 MED ORDER — WARFARIN SODIUM 5 MG PO TABS
5.0000 mg | ORAL_TABLET | Freq: Once | ORAL | Status: AC
  Administered 2015-03-23: 5 mg via ORAL
  Filled 2015-03-23 (×2): qty 1

## 2015-03-23 MED ORDER — RENA-VITE PO TABS: 1.0000 | ORAL_TABLET | Freq: Every day | ORAL | Status: DC

## 2015-03-23 MED ORDER — ALBUTEROL SULFATE (2.5 MG/3ML) 0.083% IN NEBU: 2.5000 mg | INHALATION_SOLUTION | Freq: Four times a day (QID) | RESPIRATORY_TRACT | Status: DC | PRN

## 2015-03-23 MED ORDER — LIDOCAINE-PRILOCAINE 2.5-2.5 % EX CREA
1.0000 "application " | TOPICAL_CREAM | CUTANEOUS | Status: DC | PRN
  Filled 2015-03-23: qty 5

## 2015-03-23 MED ORDER — TRAMADOL HCL 50 MG PO TABS
50.0000 mg | ORAL_TABLET | Freq: Once | ORAL | Status: AC
  Administered 2015-03-23: 50 mg via ORAL
  Filled 2015-03-23: qty 1

## 2015-03-23 MED ORDER — LEVOFLOXACIN IN D5W 500 MG/100ML IV SOLN
500.0000 mg | INTRAVENOUS | Status: DC
  Filled 2015-03-23: qty 100

## 2015-03-23 MED ORDER — HEPARIN SODIUM (PORCINE) 1000 UNIT/ML DIALYSIS
1000.0000 [IU] | INTRAMUSCULAR | Status: DC | PRN
  Filled 2015-03-23: qty 1

## 2015-03-23 MED ORDER — PANTOPRAZOLE SODIUM 40 MG PO TBEC
40.0000 mg | DELAYED_RELEASE_TABLET | Freq: Every day | ORAL | Status: DC
Start: 2015-03-24 — End: 2015-03-25
  Administered 2015-03-24 – 2015-03-25 (×2): 40 mg via ORAL
  Filled 2015-03-23 (×2): qty 1

## 2015-03-23 MED ORDER — LEVOFLOXACIN IN D5W 750 MG/150ML IV SOLN
750.0000 mg | Freq: Once | INTRAVENOUS | Status: AC
  Administered 2015-03-23: 750 mg via INTRAVENOUS
  Filled 2015-03-23: qty 150

## 2015-03-23 MED ORDER — RENA-VITE PO TABS
1.0000 | ORAL_TABLET | Freq: Every day | ORAL | Status: DC
  Administered 2015-03-23 – 2015-03-24 (×2): 1 via ORAL
  Filled 2015-03-23 (×2): qty 1

## 2015-03-23 MED ORDER — CALCITRIOL 0.5 MCG PO CAPS: 2.0000 ug | ORAL_CAPSULE | ORAL | Status: DC

## 2015-03-23 MED ORDER — HEPARIN SODIUM (PORCINE) 5000 UNIT/ML IJ SOLN: 5000.0000 [IU] | Freq: Three times a day (TID) | INTRAMUSCULAR | Status: DC

## 2015-03-23 MED ORDER — DOCUSATE SODIUM 100 MG PO CAPS: 100.0000 mg | ORAL_CAPSULE | Freq: Two times a day (BID) | ORAL | Status: DC | PRN

## 2015-03-23 MED ORDER — LIDOCAINE HCL (PF) 1 % IJ SOLN: 5.0000 mL | INTRAMUSCULAR | Status: DC | PRN

## 2015-03-23 MED ORDER — CALCIUM ACETATE 667 MG PO CAPS: 667.0000 mg | ORAL_CAPSULE | Freq: Two times a day (BID) | ORAL | Status: DC

## 2015-03-23 MED ORDER — NITROGLYCERIN 0.4 MG SL SUBL: 0.4000 mg | SUBLINGUAL_TABLET | SUBLINGUAL | Status: DC | PRN

## 2015-03-23 MED ORDER — CALCITRIOL 0.5 MCG PO CAPS
1.7500 ug | ORAL_CAPSULE | ORAL | Status: DC | PRN
  Filled 2015-03-23: qty 1

## 2015-03-23 MED ORDER — GABAPENTIN 300 MG PO CAPS
300.0000 mg | ORAL_CAPSULE | Freq: Every day | ORAL | Status: DC
  Administered 2015-03-23: 300 mg via ORAL
  Filled 2015-03-23: qty 1

## 2015-03-23 MED ORDER — SIMVASTATIN 10 MG PO TABS
5.0000 mg | ORAL_TABLET | Freq: Every day | ORAL | Status: DC
  Administered 2015-03-23 – 2015-03-24 (×2): 5 mg via ORAL
  Filled 2015-03-23 (×2): qty 1

## 2015-03-23 MED ORDER — SODIUM CHLORIDE 0.9 % IV SOLN: 100.0000 mL | INTRAVENOUS | Status: DC | PRN

## 2015-03-23 MED ORDER — HYDRALAZINE HCL 10 MG PO TABS
10.0000 mg | ORAL_TABLET | Freq: Two times a day (BID) | ORAL | Status: DC
  Administered 2015-03-23 – 2015-03-24 (×3): 10 mg via ORAL
  Filled 2015-03-23 (×3): qty 1

## 2015-03-23 MED ORDER — IMATINIB MESYLATE 100 MG PO TABS: 100.0000 mg | ORAL_TABLET | Freq: Every day | ORAL | Status: DC

## 2015-03-23 MED ORDER — CALCIUM ACETATE (PHOS BINDER) 667 MG PO CAPS: 667.0000 mg | ORAL_CAPSULE | Freq: Three times a day (TID) | ORAL | Status: DC

## 2015-03-23 MED ORDER — NEPRO/CARBSTEADY PO LIQD
237.0000 mL | Freq: Two times a day (BID) | ORAL | Status: DC
  Administered 2015-03-24 – 2015-03-25 (×2): 237 mL via ORAL
  Filled 2015-03-23 (×7): qty 237

## 2015-03-23 MED ORDER — ASPIRIN EC 81 MG PO TBEC
81.0000 mg | DELAYED_RELEASE_TABLET | Freq: Every day | ORAL | Status: DC
  Administered 2015-03-23 – 2015-03-24 (×2): 81 mg via ORAL
  Filled 2015-03-23 (×2): qty 1

## 2015-03-23 MED ORDER — BUDESONIDE 0.25 MG/2ML IN SUSP
0.2500 mg | Freq: Two times a day (BID) | RESPIRATORY_TRACT | Status: DC
  Administered 2015-03-23 – 2015-03-25 (×4): 0.25 mg via RESPIRATORY_TRACT
  Filled 2015-03-23 (×4): qty 2

## 2015-03-23 NOTE — Consult Note (Signed)
[facility] Renal Consultation Note    Indication for Consultation:  Management of ESRD/hemodialysis; anemia, hypertension/volume and secondary hyperparathyroidism PCP: Dr. Bea Graff  HPI: Annette Roach is a very pleasant 81 y.o. female with ESRD, on HD since 05/2012.  She has hemodialysis TTS at [facility]. PMH significant for DMT2, hypertension, hyperlipidemia, COPD, CAD, Steal Syndrome L  Hand, Angina, kidney stones, Asthma, Anemia, hyperparathyroidism, headache, arthritis. Recent admission 02/23/2015 for pulmonary embolism, now on coumadin.    Patient presents to ED with C/O cough, SOB with sputum production. Patient developed cough with chest pain and was seen in ED yesterday. Patient was treated with Augmentin and azithromycin and [place] home. Patient presented today to ED today with substernal chest pain radiating down both arm and productive cough without sputum production. 2 view chest xray 03/23/2015 shows pulmonary vascular congestion and small right lower lobe effusion. CT angio on 03/23/2015 was negative for PE. Patient C/O SOB, orthopnea, malaise, general weakness, feelings of "aching all over" and pain in chest when she coughs. Denies fever, chills, N,V,D, headache, syncope, abdominal pain.   Patient had hemodialysis at [facility]. She attends all treatments and is compliant to medical therapy. Last in center labs as follows: Hgb 11.1 PT/INR 2.13 (03/21/15) Phos 5.3 PTH 808 Ca 9.3 C Ca 9.9 Albumin 3.2 (02/21/2015).   Past Medical History  Diagnosis Date  . COPD (chronic obstructive pulmonary disease)     emphysema  . Neuropathy   . Hyperlipidemia   . Diabetes mellitus   . Depression   . Anemia   . Shortness of breath   . GERD (gastroesophageal reflux disease)   . Hyperparathyroidism   . Hypertension     sees Dr. Gilford Rile  . Headache(784.0)   . Arthritis   . CAD (coronary artery disease)     sees Dr. Jenne Campus, [facility] cardiology  [facility], [place]  . 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 Wears glasses   . Wears hearing aid     right  . Presence of surgically created AV shunt for hemodialysis     old lt upper arm out-rt upper arm shunt in  . Anginal pain     Dr Raliegh Ip in [place], [place]  . History of kidney stones   . Constipation   . History of blood transfusion   . Asthma     years ago  . Chronic kidney disease     T[facility] [place] [place]- Hemo  . Renal insufficiency     patient on dialysis for 3 years (today is 03/22/15   Past Surgical History  Procedure Laterality Date  . Abdominal hysterectomy  1976  . Kidney stone surgery  2013  . Av fistula placement  01/20/2012    Procedure: ARTERIOVENOUS (AV) FISTULA CREATION;  Surgeon: Elam Dutch, MD;  Location: [place];  Service: Vascular;  Laterality: Left;  . Hemodialysis catheter  05/25/12    secondary to failed AVF   . Eye surgery      bilateral cataract removed  . Av fistula placement  09/05/2012    Procedure: INSERTION OF ARTERIOVENOUS (AV) GORE-TEX GRAFT ARM;  Surgeon: Elam Dutch, MD;  Location: [facility] OR;  Service: Vascular;  Laterality: Left;  Using 4-50mm x 45 cm Vascular stretch goretex graft  . Ligation of arteriovenous  fistula  09/05/2012    Procedure: LIGATION OF ARTERIOVENOUS  FISTULA;  Surgeon: Elam Dutch, MD;  Location: [place];  Service: Vascular;  Laterality: Left;  . Ligation arteriovenous gortex graft  09/05/2012    Procedure: LIGATION ARTERIOVENOUS GORTEX GRAFT;  Surgeon: Elam Dutch, MD;  Location: [place];  Service: Vascular;  Laterality: Left;  . Av fistula placement Right 11/07/2012    Procedure: ARTERIOVENOUS (AV) FISTULA CREATION;  Surgeon: Elam Dutch, MD;  Location: [facility] OR;  Service: Vascular;  Laterality: Right;  Bascilic Vein Fistula  . Appendectomy    . Carpal tunnel release Left 02/10/2013    Procedure: CARPAL TUNNEL RELEASE;  Surgeon: Cammie Sickle., MD;  Location: [place];  Service:  Orthopedics;  Laterality: Left;  . Bascilic vein transposition Right 03/15/2013    Procedure: 2ND STAGE BASCILIC VEIN TRANSPOSITION - RIGHT ARM;  Surgeon: Elam Dutch, MD;  Location: [place];  Service: Vascular;  Laterality: Right;  . Av fistula placement Right 05/15/2013    Procedure: INSERTION OF ARTERIOVENOUS (AV) GORE-TEX GRAFT ARM-RIGHT;  Surgeon: Elam Dutch, MD;  Location: [place];  Service: Vascular;  Laterality: Right;  . Shuntogram N/A 07/27/2012    Procedure: Earney Mallet;  Surgeon: Rosetta Posner, MD;  Location: [facility] CATH LAB;  Service: Cardiovascular;  Laterality: N/A;   Family History  Problem Relation Age of Onset  . Diabetes Mother   . Cancer Father   . Deep vein thrombosis Brother   . Hypertension Brother    Social History:  reports that she has never smoked. She has never used smokeless tobacco. She reports that she does not drink alcohol or use illicit drugs. No Known Allergies Prior to Admission medications   Medication Sig Start Date End Date Taking? Authorizing Provider  albuterol (PROVENTIL) (2.5 MG/3ML) 0.083% nebulizer solution Take 2.5 mg by nebulization every 6 (six) hours as needed for wheezing or shortness of breath.   Yes Historical Provider, MD  amoxicillin-clavulanate (AUGMENTIN) 500-125 MG per tablet Take 1 tablet (500 mg total) by mouth daily. 03/22/15  Yes Robyn M Hess, PA-C  Azelastine HCl 0.15 % SOLN Place 1 spray into both nostrils daily.  10/10/14  Yes Historical Provider, MD  azithromycin (ZITHROMAX) 250 MG tablet Take 1 tablet (250 mg total) by mouth daily. Take 1 tablet by mouth daily 03/22/15  Yes Robyn M Hess, PA-C  beclomethasone (QVAR) 80 MCG/ACT inhaler Inhale 2 puffs into the lungs 2 (two) times daily.   Yes Historical Provider, MD  calcium acetate (PHOSLO) 667 MG capsule Take 667 mg by mouth 2 (two) times daily before a meal.    Yes Historical Provider, MD  docusate sodium (COLACE) 100 MG capsule Take 100 mg by mouth 2 (two) times daily as needed  for mild constipation.   Yes Historical Provider, MD  hydrALAZINE (APRESOLINE) 10 MG tablet Take 10 mg by mouth 2 (two) times daily.   Yes Historical Provider, MD  multivitamin (RENA-VIT) TABS tablet Take 1 tablet by mouth daily.   Yes Historical Provider, MD  Nutritional Supplements (FEEDING SUPPLEMENT, NEPRO CARB STEADY,) LIQD Take 237 mLs by mouth as needed (missed meal during dialysis.). 11/20/14  Yes Belkys A Regalado, MD  pantoprazole (PROTONIX) 40 MG tablet Take 40 mg by mouth daily.   Yes Historical Provider, MD  polyethylene glycol (MIRALAX / GLYCOLAX) packet Take 17 g by mouth daily.   Yes Historical Provider, MD  prochlorperazine (COMPAZINE) 10 MG tablet TAKE 1 TABLET EVERY 6 HOURS AS NEEDED FOR CHEMO NAUSEA-MAY TAKE AROUND THE CLOCK FOR PERSISTENT SYMP 01/30/15  Yes Historical Provider, MD  traMADol (ULTRAM) 50  MG tablet Take 50 mg by mouth every 6 (six) hours as needed for moderate pain.   Yes Historical Provider, MD  VENTOLIN HFA 108 (90 BASE) MCG/ACT inhaler Inhale 2 puffs into the lungs every 6 (six) hours as needed for wheezing or shortness of breath.  10/12/14  Yes Historical Provider, MD  warfarin (COUMADIN) 5 MG tablet Take 1.5 tablets (7.5 mg total) by mouth one time only at 6 PM. 02/25/15  Yes Tasrif Ahmed, MD  acetaminophen (TYLENOL) 500 MG tablet Take 500 mg by mouth 2 (two) times daily as needed for moderate pain. For pain    Historical Provider, MD  aspirin EC 81 MG tablet Take 81 mg by mouth at bedtime.     Historical Provider, MD  calcitRIOL (ROCALTROL) 0.25 MCG capsule Take 7 capsules (1.75 mcg total) by mouth every dialysis (secondary hyperparathyroidism). Patient not taking: Reported on 02/23/2015 12/29/14   Cherene Altes, MD  gabapentin (NEURONTIN) 300 MG capsule Take 300 mg by mouth at bedtime.    Historical Provider, MD  imatinib (GLEEVEC) 100 MG tablet Take 100 mg by mouth daily. Take at 3:30pm daily 02/04/15   Historical Provider, MD  nitroGLYCERIN (NITROSTAT) 0.4 MG SL  tablet Place 0.4 mg under the tongue every 5 (five) minutes as needed for chest pain.    Historical Provider, MD  Psyllium (DIETARY FIBER LAXATIVE PO) Take 1 capsule by mouth daily as needed (Constipation).    Historical Provider, MD  simvastatin (ZOCOR) 5 MG tablet Take 5 mg by mouth at bedtime.    Historical Provider, MD   Current Facility-Administered Medications  Medication Dose Route Frequency Provider Last Rate Last Dose  . calcium acetate (PHOSLO) capsule 667 mg  667 mg Oral TID WC Annia Belt, MD      . feeding supplement (NEPRO CARB STEADY) liquid 237 mL  237 mL Oral BID BM Annia Belt, MD      . Derrill Memo ON 03/25/2015] levofloxacin (LEVAQUIN) IVPB 500 mg  500 mg Intravenous Q48H Benjamin G Mancheril, RPH      . levofloxacin (LEVAQUIN) IVPB 750 mg  750 mg Intravenous Once Anh P Pham, RPH      . multivitamin (RENA-VIT) tablet 1 tablet  1 tablet Oral QHS Annia Belt, MD       Labs: Basic Metabolic Panel:  Recent Labs Lab 03/22/15 1745 03/23/15 1037  NA 134* 135  K 4.0 4.5  CL 92* 95*  CO2 30  --   GLUCOSE 165* 119*  BUN 18 26*  CREATININE 6.62* 7.30*  CALCIUM 9.9  --    Liver Function Tests: No results for input(s): AST, ALT, ALKPHOS, BILITOT, PROT, ALBUMIN in the last 168 hours. No results for input(s): LIPASE, AMYLASE in the last 168 hours. No results for input(s): AMMONIA in the last 168 hours. CBC:  Recent Labs Lab 03/22/15 1745 03/23/15 1027 03/23/15 1037  WBC 5.1 5.0  --   NEUTROABS  --  3.4  --   HGB 12.2 11.3* 14.3  HCT 37.7 35.5* 42.0  MCV 84.3 85.1  --   PLT 190 230  --    Cardiac Enzymes: No results for input(s): CKTOTAL, CKMB, CKMBINDEX, TROPONINI in the last 168 hours. CBG: No results for input(s): GLUCAP in the last 168 hours. Iron Studies: No results for input(s): IRON, TIBC, TRANSFERRIN, FERRITIN in the last 72 hours. Studies/Results: Dg Chest 2 View  03/23/2015   CLINICAL DATA:  Chronic chest pain and shortness of  breath which  worsened yesterday.  EXAM: CHEST  2 VIEW  COMPARISON:  CT chest 03/22/2015 and 02/23/2015. PA and lateral chest 02/23/2015.  FINDINGS: There is marked cardiomegaly. Small right effusion and basilar airspace disease appear unchanged compared to the prior plain films. There is pulmonary vascular congestion without frank edema. No pneumothorax is identified.  IMPRESSION: No change in a small right pleural effusion and basilar airspace disease.  Cardiomegaly and vascular congestion.   Electronically Signed   By: Inge Rise M.D.   On: 03/23/2015 11:41   Ct Angio Chest Pe W/cm &/or Wo Cm  03/22/2015   CLINICAL DATA:  One day history of shortness of breath. Chronic renal failure.  EXAM: CT ANGIOGRAPHY CHEST WITH CONTRAST  TECHNIQUE: Multidetector CT imaging of the chest was performed using the standard protocol during bolus administration of intravenous contrast. Multiplanar CT image reconstructions and MIPs were obtained to evaluate the vascular anatomy.  CONTRAST:  68mL OMNIPAQUE IOHEXOL 350 MG/ML SOLN  COMPARISON:  Chest radiograph February 23, 2015; chest CT angiogram February 23, 2015  FINDINGS: The previously noted pulmonary emboli have resolved. Currently, no demonstrable pulmonary emboli are present. There is atherosclerotic change in the aorta, but no appreciable aneurysm or dissection is visualized. The heart is enlarged with mild left ventricular hypertrophy. The pericardium is not thickened. There is atherosclerotic change at several sites in the visualized great vessels, most notably in the proximal left subclavian artery.  There are bilateral pleural effusions with bibasilar airspace consolidation, more on the right than on the left. Some of this consolidation is due to atelectasis. Superimposed pneumonia, particularly in the right base, cannot be excluded. There is mild bibasilar interstitial edema as well.  Visualized thyroid appears normal except for a single benign-appearing calcification in  the mid right lobe. There are subcentimeter mediastinal lymph nodes but no adenopathy by size criteria.  There are multiple foci of coronary artery calcification.  In the visualized upper abdomen, there are scattered calcified granulomas in the liver. There is atherosclerotic change in the aorta.  There is degenerative change in the thoracic spine. There are no blastic or lytic bone lesions.  Review of the MIP images confirms the above findings.  IMPRESSION: No demonstrable pulmonary embolus. The previously noted pulmonary emboli have resolved.  Cardiomegaly with mild interstitial edema and bilateral effusions. Evidence of a degree of congestive heart failure.  Atelectasis with questionable superimposed pneumonia in the bases, particularly in the right lower lobe region.  No appreciable adenopathy.  Multiple foci of coronary artery calcification.   Electronically Signed   By: Lowella Grip III M.D.   On: 03/22/2015 19:57    ROS: As per HPI otherwise negative.   Physical Exam: Filed Vitals:   03/23/15 0944 03/23/15 1215 03/23/15 1334  BP: 171/71 170/75 185/76  Pulse: 108 103 104  Temp: 98.2 F (36.8 C)  98.2 F (36.8 C)  TempSrc: Oral    Resp: 17 26   Height: 5\' 2"  (1.575 m)    Weight: 68.04 kg (150 lb)    SpO2: 95% 96% 98%     General: Well developed, well nourished, in no acute distress. Head: Normocephalic, atraumatic, sclera non-icteric, mucus membranes are moist, Fundi HTN retinopathy Neck: Supple. JVD not elevated. Lungs: Clear bilaterally to auscultation decreased in bases >R than L. Few bibasilar crackles. No WOB present. Symmetrical chest excursions.  Heart: RRR with S1 S2.  S3 vs Split S2. No M/R. LV lift Abdomen: Soft, non-tender, non-distended with normoactive bowel sounds. No rebound/guarding. No obvious  abdominal masses. M-S:  Strength and tone appear normal for age. Lower extremities:without edema or ischemic changes, no open wounds. 2+ peripheral pulses.   Skin: Warm,  dry, intact without cyanosis, rashes, lesions.   Neuro: Alert and oriented X 3. Moves all extremities spontaneously.CN II-XII grossly intact.  Psych:  Responds to questions appropriately with a normal affect. Cooperative and alert.  Dialysis Access: RUA AVG + Thrill + Bruit.   Dialysis Orders: Center: [facility] on TTS . EDW 65.5 kg  HD Bath 2.0 K 2.0 Ca 1 mg  Time 3.5 hours Heparin 5000 units per treatment. Access RUA AVG BFR 400 DFR [place] 1.5     Mircera 150 mcg IV q 2 weeks ( last dose 03/21/15) Calcitriol 2.25 mcg PO TTS with HD  Assessment/Plan: 1.  HCAP: being treated empirically on Levaquin. T max 98.2  Suspect vol xs playing a role.  2.  ESRD -  TTS at [facility]. Heparin 5000 units q treatment.  3.  Hypertension/volume  - Suspect patient is actually volume overloaded. Will have HD today and attempt 4 liters. Will lower EDW and lengthen treatment. PO EDW 65.5 kg. Wt today 64.8.  4.  Anemia  - HGB 11.3. Recent dose of Mircera. Will follow CBCs. 5.  Metabolic bone disease -  Ca 9.9 03/22/15. Phos 5.3 PTH 808 Ca 9.3 C Ca 9.9 Albumin 3.2 (02/21/2015). Continue binders and reduce Calcitiol dose to 2 mcg as patient is on Ca Acetate.  6.  Nutrition - Renal Diet, Renal vit/nepro ordered. Optimize nutrition. 7. History of PE: Coumadin per pharmacy 8. DM: per primary 9. LV Hypertrophy: 12 lead shows L axis deviation with evidence of LVH. Slight troponin leak. Could be demand ischemia. Defer to primary.   Rita H. Owens Shark, NP-C 03/23/2015, 2:00 PM  [employer] [phone] I have seen and examined this patient and agree with the plan of care seen, examined, eval, counseled patient.  .  Sashia Campas L 03/23/2015, 3:06 PM

## 2015-03-23 NOTE — Progress Notes (Signed)
ANTICOAGULATION CONSULT NOTE - Follow Up Consult  Pharmacy Consult for Warfarin Indication: Hx recent PE (June 2016)  No Known Allergies  Patient Measurements: Height: 5\' 2"  (157.5 cm) Weight: 142 lb 13.7 oz (64.8 kg) IBW/kg (Calculated) : 50.1  Vital Signs: Temp: 98.3 F (36.8 C) (07/23 1433) Temp Source: Oral (07/23 1433) BP: 128/66 mmHg (07/23 1707) Pulse Rate: 94 (07/23 1707)  Labs:  Recent Labs  03/22/15 1745 03/23/15 1021 03/23/15 1027 03/23/15 1037 03/23/15 1446  HGB 12.2  --  11.3* 14.3 10.8*  HCT 37.7  --  35.5* 42.0 33.5*  PLT 190  --  230  --  246  LABPROT  --  28.8*  --   --   --   INR  --  2.77*  --   --   --   CREATININE 6.62*  --   --  7.30* 7.91*    Estimated Creatinine Clearance: 4.6 mL/min (by C-G formula based on Cr of 7.91).   Assessment: 63 YOF on warfarin PTA for hx recent PE in June '16 who presented today with SOB concerning for PNA. The patient's INR was therapeutic on admission (INR 2.77) on PTA dose of 7.5 mg daily. Last dose PTA was on 7/22. Pharmacy has been consulted to resume warfarin dosing this admission.  The patient has started on Levaquin for empiric HCAP coverage which will increase warfarin sensitivity. No overt s/sx of bleeding noted at this time. Will give a slightly lower dose this evening due to the interaction with Levaquin.   Goal of Therapy:  INR 2-3   Plan:  1. Warfarin 5 mg x 1 dose at 1800 2. Daily PT/INR 3. Will continue to monitor for any signs/symptoms of bleeding and will follow up with PT/INR in the a.m.   Alycia Rossetti, PharmD, BCPS Clinical Pharmacist Pager: [phone] 03/23/2015 5:44 PM

## 2015-03-23 NOTE — ED Provider Notes (Signed)
CSN: [id number]     Arrival date & time 03/23/15  7425 History   First MD Initiated Contact with Patient 03/23/15 [phone]     Chief Complaint  Patient presents with  . Chest Pain     (Consider location/radiation/quality/duration/timing/severity/associated sxs/prior Treatment) HPI Comments: Patient states that she developed dry cough yesterday and chest pain with coughing. She was seen in the emergency room yesterday and diagnosed with a early pneumonia. She was given azithromycin and Augmentin which she had one dose before she left. Since being home patient has had some coughing but again woke up this morning with coughing and chest pain which was substernal radiated into her arms and sharp. She now is coughing up yellow mucus which is new. Given the pain in the now productive cough she was concerned that things are getting worse and not better. She denies any fever but was recently hospitalized last week for a pulmonary embolism and continues to take Coumadin.  CT of the chest done yesterday ruled out any new clots  Patient is a 81 y.o. female presenting with chest pain. The history is provided by the patient and a relative.  Chest Pain Pain location:  Substernal area Pain quality: radiating and sharp   Radiates to: bilateral upper arms. Pain radiates to the back: no   Pain severity:  Moderate Onset quality:  Sudden Timing:  Constant Progression:  Resolved Chronicity:  New Context comment:  Pain started with the cough this morning Relieved by:  None tried Worsened by:  Coughing Ineffective treatments:  None tried Associated symptoms: cough   Associated symptoms: no abdominal pain, no fever, no nausea, no shortness of breath, not vomiting and no weakness   Cough:    Cough characteristics:  Productive   Sputum characteristics:  Yellow   Severity:  Moderate   Onset quality:  Gradual   Duration:  2 days   Timing:  Constant   Progression:  Worsening   Chronicity:  New   Past Medical  History  Diagnosis Date  . COPD (chronic obstructive pulmonary disease)     emphysema  . Neuropathy   . Hyperlipidemia   . Diabetes mellitus   . Depression   . Anemia   . Shortness of breath   . GERD (gastroesophageal reflux disease)   . Hyperparathyroidism   . Hypertension     sees Dr. Gilford Rile  . Headache(784.0)   . Arthritis   . CAD (coronary artery disease)     sees Dr. Jenne Campus, [facility] [place], [place]  . 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 Wears glasses   . Wears hearing aid     right  . Presence of surgically created AV shunt for hemodialysis     old lt upper arm out-rt upper arm shunt in  . Anginal pain     Dr Raliegh Ip in [place], [place]  . History of kidney stones   . Constipation   . History of blood transfusion   . Asthma     years ago  . Chronic kidney disease     [facility] [place], [place]- Hemo  . Renal insufficiency     patient on dialysis for 3 years (today is 03/22/15   Past Surgical History  Procedure Laterality Date  . Abdominal hysterectomy  1976  . Kidney stone surgery  2013  . Av fistula placement  01/20/2012    Procedure: ARTERIOVENOUS (AV) FISTULA CREATION;  Surgeon: Elam Dutch, MD;  Location: [facility] OR;  Service: Vascular;  Laterality: Left;  . Hemodialysis catheter  05/25/12    secondary to failed AVF   . Eye surgery      bilateral cataract removed  . Av fistula placement  09/05/2012    Procedure: INSERTION OF ARTERIOVENOUS (AV) GORE-TEX GRAFT ARM;  Surgeon: Elam Dutch, MD;  Location: [facility] OR;  Service: Vascular;  Laterality: Left;  Using 4-77mm x 45 cm Vascular stretch goretex graft  . Ligation of arteriovenous  fistula  09/05/2012    Procedure: LIGATION OF ARTERIOVENOUS  FISTULA;  Surgeon: Elam Dutch, MD;  Location: [facility];  Service: Vascular;  Laterality: Left;  . Ligation arteriovenous gortex graft  09/05/2012    Procedure: LIGATION ARTERIOVENOUS GORTEX GRAFT;  Surgeon: Elam Dutch, MD;   Location: [place] [facility];  Service: Vascular;  Laterality: Left;  . Av fistula placement Right 11/07/2012    Procedure: ARTERIOVENOUS (AV) FISTULA CREATION;  Surgeon: Elam Dutch, MD;  Location: [facility] OR;  Service: Vascular;  Laterality: Right;  Bascilic Vein Fistula  . Appendectomy    . Carpal tunnel release Left 02/10/2013    Procedure: CARPAL TUNNEL RELEASE;  Surgeon: Cammie Sickle., MD;  Location: [facility];  Service: Orthopedics;  Laterality: Left;  . Bascilic vein transposition Right 03/15/2013    Procedure: 2ND STAGE BASCILIC VEIN TRANSPOSITION - RIGHT ARM;  Surgeon: Elam Dutch, MD;  Location: [place];  Service: Vascular;  Laterality: Right;  . Av fistula placement Right 05/15/2013    Procedure: INSERTION OF ARTERIOVENOUS (AV) GORE-TEX GRAFT ARM-RIGHT;  Surgeon: Elam Dutch, MD;  Location: [place];  Service: Vascular;  Laterality: Right;  . Shuntogram N/A 07/27/2012    Procedure: Earney Mallet;  Surgeon: Rosetta Posner, MD;  Location: [facility] CATH LAB;  Service: Cardiovascular;  Laterality: N/A;   Family History  Problem Relation Age of Onset  . Diabetes Mother   . Cancer Father   . Deep vein thrombosis Brother   . Hypertension Brother    History  Substance Use Topics  . Smoking status: Never Smoker   . Smokeless tobacco: Never Used  . Alcohol Use: No   OB History    No data available     Review of Systems  Constitutional: Negative for fever.  Respiratory: Positive for cough. Negative for shortness of breath.   Cardiovascular: Positive for chest pain.  Gastrointestinal: Negative for nausea, vomiting and abdominal pain.  Neurological: Negative for weakness.  All other systems reviewed and are negative.     Allergies  Review of patient's allergies indicates no known allergies.  Home Medications   Prior to Admission medications   Medication Sig Start Date End Date Taking? Authorizing Provider  acetaminophen (TYLENOL) 500 MG tablet Take 500 mg by mouth 2  (two) times daily as needed for moderate pain. For pain    Historical Provider, MD  albuterol (PROVENTIL) (2.5 MG/3ML) 0.083% nebulizer solution Take 2.5 mg by nebulization every 6 (six) hours as needed for wheezing or shortness of breath.    Historical Provider, MD  amoxicillin-clavulanate (AUGMENTIN) 500-125 MG per tablet Take 1 tablet (500 mg total) by mouth daily. 03/22/15   Carman Ching, PA-C  aspirin EC 81 MG tablet Take 81 mg by mouth at bedtime.     Historical Provider, MD  Azelastine HCl 0.15 % SOLN Place 1 spray into both nostrils daily.  10/10/14   Historical Provider, MD  azithromycin (ZITHROMAX) 250 MG tablet Take 1 tablet (250  mg total) by mouth daily. Take 1 tablet by mouth daily 03/22/15   Carman Ching, PA-C  beclomethasone (QVAR) 80 MCG/ACT inhaler Inhale 2 puffs into the lungs 2 (two) times daily.    Historical Provider, MD  calcitRIOL (ROCALTROL) 0.25 MCG capsule Take 7 capsules (1.75 mcg total) by mouth every dialysis (secondary hyperparathyroidism). Patient not taking: Reported on 02/23/2015 12/29/14   Cherene Altes, MD  calcium acetate (PHOSLO) 667 MG capsule Take 667 mg by mouth 2 (two) times daily before a meal.     Historical Provider, MD  docusate sodium (COLACE) 100 MG capsule Take 100 mg by mouth 2 (two) times daily as needed for mild constipation.    Historical Provider, MD  gabapentin (NEURONTIN) 300 MG capsule Take 300 mg by mouth at bedtime.    Historical Provider, MD  hydrALAZINE (APRESOLINE) 10 MG tablet Take 10 mg by mouth 2 (two) times daily.    Historical Provider, MD  imatinib (GLEEVEC) 100 MG tablet Take 100 mg by mouth daily. Take at 3:30pm daily 02/04/15   Historical Provider, MD  multivitamin (RENA-VIT) TABS tablet Take 1 tablet by mouth daily.    Historical Provider, MD  nitroGLYCERIN (NITROSTAT) 0.4 MG SL tablet Place 0.4 mg under the tongue every 5 (five) minutes as needed for chest pain.    Historical Provider, MD  Nutritional Supplements (FEEDING  SUPPLEMENT, NEPRO CARB STEADY,) LIQD Take 237 mLs by mouth as needed (missed meal during dialysis.). Patient not taking: Reported on 03/22/2015 11/20/14   Belkys A Regalado, MD  pantoprazole (PROTONIX) 40 MG tablet Take 40 mg by mouth daily.    Historical Provider, MD  polyethylene glycol (MIRALAX / GLYCOLAX) packet Take 17 g by mouth daily.    Historical Provider, MD  prochlorperazine (COMPAZINE) 10 MG tablet TAKE 1 TABLET EVERY 6 HOURS AS NEEDED FOR CHEMO NAUSEA-MAY TAKE AROUND THE CLOCK FOR PERSISTENT SYMP 01/30/15   Historical Provider, MD  Psyllium (DIETARY FIBER LAXATIVE PO) Take 1 capsule by mouth daily as needed (Constipation).    Historical Provider, MD  simvastatin (ZOCOR) 5 MG tablet Take 5 mg by mouth at bedtime.    Historical Provider, MD  traMADol (ULTRAM) 50 MG tablet Take 50 mg by mouth every 6 (six) hours as needed for moderate pain.    Historical Provider, MD  VENTOLIN HFA 108 (90 BASE) MCG/ACT inhaler Inhale 2 puffs into the lungs every 6 (six) hours as needed for wheezing or shortness of breath.  10/12/14   Historical Provider, MD  warfarin (COUMADIN) 5 MG tablet Take 1.5 tablets (7.5 mg total) by mouth one time only at 6 PM. 02/25/15   Tasrif Ahmed, MD   BP 171/71 mmHg  Pulse 108  Temp(Src) 98.2 F (36.8 C) (Oral)  Resp 17  Ht 5\' 2"  (1.575 m)  Wt 150 lb (68.04 kg)  BMI 27.43 kg/m2  SpO2 95% Physical Exam  Constitutional: She is oriented to person, place, and time. She appears well-developed and well-nourished. No distress.  HENT:  Head: Normocephalic and atraumatic.  Mouth/Throat: Oropharynx is clear and moist.  Eyes: Conjunctivae and EOM are normal. Pupils are equal, round, and reactive to light.  Neck: Normal range of motion. Neck supple.  Cardiovascular: Regular rhythm and intact distal pulses.  Tachycardia present.   No murmur heard. Pulmonary/Chest: Effort normal. No respiratory distress. She has decreased breath sounds in the right lower field and the left lower  field. She has no wheezes. She has rales in the right lower field.  Abdominal: Soft. She exhibits no distension. There is no tenderness. There is no rebound and no guarding.  Musculoskeletal: Normal range of motion. She exhibits no edema or tenderness.  Neurological: She is alert and oriented to person, place, and time.  Skin: Skin is warm and dry. No rash noted. No erythema.  Psychiatric: She has a normal mood and affect. Her behavior is normal.  Nursing note and vitals reviewed.   ED Course  Procedures (including critical care time) Labs Review Labs Reviewed  CBC WITH DIFFERENTIAL/PLATELET - Abnormal; Notable for the following:    Hemoglobin 11.3 (*)    HCT 35.5 (*)    RDW 18.4 (*)    All other components within normal limits  I-STAT CHEM 8, ED - Abnormal; Notable for the following:    Chloride 95 (*)    BUN 26 (*)    Creatinine, Ser 7.30 (*)    Glucose, Bld 119 (*)    All other components within normal limits  I-STAT TROPOININ, ED - Abnormal; Notable for the following:    Troponin i, poc 0.10 (*)    All other components within normal limits    Imaging Review Ct Angio Chest Pe W/cm &/or Wo Cm  03/22/2015   CLINICAL DATA:  One day history of shortness of breath. Chronic renal failure.  EXAM: CT ANGIOGRAPHY CHEST WITH CONTRAST  TECHNIQUE: Multidetector CT imaging of the chest was performed using the standard protocol during bolus administration of intravenous contrast. Multiplanar CT image reconstructions and MIPs were obtained to evaluate the vascular anatomy.  CONTRAST:  69mL OMNIPAQUE IOHEXOL 350 MG/ML SOLN  COMPARISON:  Chest radiograph February 23, 2015; chest CT angiogram February 23, 2015  FINDINGS: The previously noted pulmonary emboli have resolved. Currently, no demonstrable pulmonary emboli are present. There is atherosclerotic change in the aorta, but no appreciable aneurysm or dissection is visualized. The heart is enlarged with mild left ventricular hypertrophy. The pericardium  is not thickened. There is atherosclerotic change at several sites in the visualized great vessels, most notably in the proximal left subclavian artery.  There are bilateral pleural effusions with bibasilar airspace consolidation, more on the right than on the left. Some of this consolidation is due to atelectasis. Superimposed pneumonia, particularly in the right base, cannot be excluded. There is mild bibasilar interstitial edema as well.  Visualized thyroid appears normal except for a single benign-appearing calcification in the mid right lobe. There are subcentimeter mediastinal lymph nodes but no adenopathy by size criteria.  There are multiple foci of coronary artery calcification.  In the visualized upper abdomen, there are scattered calcified granulomas in the liver. There is atherosclerotic change in the aorta.  There is degenerative change in the thoracic spine. There are no blastic or lytic bone lesions.  Review of the MIP images confirms the above findings.  IMPRESSION: No demonstrable pulmonary embolus. The previously noted pulmonary emboli have resolved.  Cardiomegaly with mild interstitial edema and bilateral effusions. Evidence of a degree of congestive heart failure.  Atelectasis with questionable superimposed pneumonia in the bases, particularly in the right lower lobe region.  No appreciable adenopathy.  Multiple foci of coronary artery calcification.   Electronically Signed   By: Lowella Grip III M.D.   On: 03/22/2015 19:57    ED ECG REPORT   Date: 03/23/2015  Rate: 108  Rhythm: sinus tachycardia  QRS Axis: normal  Intervals: QT prolonged  ST/T Wave abnormalities: nonspecific ST/T changes  Conduction Disutrbances:none  Narrative Interpretation:   Old EKG  Reviewed: unchanged  I have personally reviewed the EKG tracing and agree with the computerized printout as noted.   MDM   Final diagnoses:  HCAP (healthcare-associated pneumonia)    Patient with a history of  end-stage renal disease on dialysis who was seen in the emergency department yesterday after having chest pain and a new dry cough. She had labs which were unrevealing and then had a CT of her chest which showed a resolved pulmonary embolism, evidence of mild fluid overload as well as concern for a developing pneumonia in the right lower lobe. Patient was treated with azithromycin and Augmentin which she had a dose before leaving the emergency room and has not taken anymore. She returns today because she awoke this morning with recurrent chest pain and a cough that's now productive of sputum. She denies any fever or significant shortness of breath at this time. She is due for dialysis today as well.  She has decreased breath sounds in the lower lobe and mild rales in the right lower lobe. Otherwise patient is well-appearing.  12:05 PM Chest x-ray with persistent right airspace opacity. Labs unchanged. Patient started on Levaquin and admitted to the internal medicine service.  Blanchie Dessert, MD 03/23/15 [phone]

## 2015-03-23 NOTE — ED Notes (Signed)
Pt discharged last night form hospital with diagnosis of pneumonia. Pt sent home with prescription for two antibiotics but never got them filled. Pt states her pneumonia has gotten worse, including cough and chest tightness.

## 2015-03-23 NOTE — Progress Notes (Signed)
ED nurse [facility]) stated that she stopped levaquin 750mg  IV bag after 10 minutes of infusion b/c she thought dose was changed to 500mg .  Will send a new levaquin bag to unit and give that dose now.  Dia Sitter, PharmD, BCPS 03/23/2015 1:48 PM

## 2015-03-23 NOTE — H&P (Signed)
Date: 03/23/2015               Patient Name:  Annette Roach MRN: [id number]  DOB: 07/05/1928 Age / Sex: 81 y.o., female   PCP: Raina Mina, MD         Medical Service: Internal Medicine Teaching Service         Attending Physician: Dr. Annia Belt, MD    First Contact: Dr. Marlowe Sax Pager: [phone]  Second Contact: Dr. Ronnald Ramp Pager: [phone]       After Hours (After 5p/  First Contact Pager: [phone]  weekends / holidays): Second Contact Pager: [phone]   Chief Complaint: SOB and cough  History of Present Illness: 81 yo F COPD, HLD, DM, HTN, CAD, ESRD on HD (T,R,Sat), GIST (diagnosed 11/2014) presenting with progressive SOB for 1 week and cough since yesterday. Patient states she has sharp/ stabbing substernal and right sided chest pain only when she is coughing. States she is coughing up yellowish sputum.Denies any pressure-like chest pain, nausea, dizziness or diaphoresis. Denies any fevers or chills. Pt daughter states the pt goes to [place] for dialysis (Dr. Justin Mend) and that she was last dialyzed on Thursday.    Of note: Pt presented last month with chest pain and was found to have a small left-sided pulmonary embolism. She was started on Lovenox and bridge onto Coumadin, has been doing well over the last month until she developed recurrent chest pain and presented to the ED yesterday 7/22 with complain of gradual onset substernal CP and cough. She was tachycardic, vitals otherwise stable. CT negative for PE. Troponin 0.09 but was also elevated a month ago as per ED note. She was diagnosed with pneumonia and given Z-Pak as normally prescribed and Augmentin regular release 500 mg every 24 hours. Pt was discharged home and asked to f/u with PCP/ return to hospital if symptoms worsened.   Meds: Current Facility-Administered Medications  Medication Dose Route Frequency Provider Last Rate Last Dose  . 0.9 %  sodium chloride infusion  100 mL Intravenous PRN Annia Belt, MD        . 0.9 %  sodium chloride infusion  100 mL Intravenous PRN Annia Belt, MD      . albuterol (PROVENTIL) (2.5 MG/3ML) 0.083% nebulizer solution 2.5 mg  2.5 mg Inhalation Q6H PRN Corky Sox, MD      . alteplase (CATHFLO ACTIVASE) injection 2 mg  2 mg Intracatheter Once PRN Annia Belt, MD      . aspirin EC tablet 81 mg  81 mg Oral QHS Corky Sox, MD      . budesonide (PULMICORT) nebulizer solution 0.25 mg  0.25 mg Nebulization BID Corky Sox, MD      . calcitRIOL (ROCALTROL) capsule 1.75 mcg  1.75 mcg Oral Q dialysis Corky Sox, MD      . Derrill Memo ON 03/26/2015] calcitRIOL (ROCALTROL) capsule 2 mcg  2 mcg Oral Q T,Th,Sa-HD Annia Belt, MD      . calcium acetate (PHOSLO) capsule 667 mg  667 mg Oral TID WC Annia Belt, MD      . docusate sodium (COLACE) capsule 100 mg  100 mg Oral BID PRN Corky Sox, MD      . feeding supplement (NEPRO CARB STEADY) liquid 237 mL  237 mL Oral PRN Annia Belt, MD      . feeding supplement (NEPRO CARB STEADY) liquid 237 mL  237 mL Oral BID BM  Annia Belt, MD      . gabapentin (NEURONTIN) capsule 300 mg  300 mg Oral QHS Corky Sox, MD      . heparin injection 1,000 Units  1,000 Units Dialysis PRN Annia Belt, MD      . heparin injection 5,000 Units  5,000 Units Subcutaneous 3 times per day Corky Sox, MD      . heparin injection 5,000 Units  5,000 Units Dialysis Once in dialysis Annia Belt, MD   Stopped at 03/23/15 1500  . hydrALAZINE (APRESOLINE) tablet 10 mg  10 mg Oral BID Corky Sox, MD      . imatinib (GLEEVEC) tablet 100 mg  100 mg Oral Daily Corky Sox, MD   Stopped at 03/23/15 1415  . [START ON 03/25/2015] levofloxacin (LEVAQUIN) IVPB 500 mg  500 mg Intravenous Q48H Benjamin G Mancheril, RPH      . levofloxacin (LEVAQUIN) IVPB 750 mg  750 mg Intravenous Once Anh P Pham, RPH      . lidocaine (PF) (XYLOCAINE) 1 % injection 5 mL  5 mL Intradermal PRN Annia Belt, MD      .  lidocaine-prilocaine (EMLA) cream 1 application  1 application Topical PRN Annia Belt, MD      . multivitamin (RENA-VIT) tablet 1 tablet  1 tablet Oral QHS Corky Sox, MD      . multivitamin (RENA-VIT) tablet 1 tablet  1 tablet Oral QHS Annia Belt, MD      . nitroGLYCERIN (NITROSTAT) SL tablet 0.4 mg  0.4 mg Sublingual Q5 min PRN Corky Sox, MD      . Derrill Memo ON 03/24/2015] pantoprazole (PROTONIX) EC tablet 40 mg  40 mg Oral Daily Corky Sox, MD      . pentafluoroprop-tetrafluoroeth (GEBAUERS) aerosol 1 application  1 application Topical PRN Annia Belt, MD      . simvastatin (ZOCOR) tablet 5 mg  5 mg Oral QHS Corky Sox, MD      . traMADol Veatrice Bourbon) tablet 50 mg  50 mg Oral Q6H PRN Corky Sox, MD        Allergies: Allergies as of 03/23/2015  . (No Known Allergies)   Past Medical History  Diagnosis Date  . COPD (chronic obstructive pulmonary disease)     emphysema  . Neuropathy   . Hyperlipidemia   . Diabetes mellitus   . Depression   . Anemia   . Shortness of breath   . GERD (gastroesophageal reflux disease)   . Hyperparathyroidism   . Hypertension     sees Dr. Gilford Rile  . Headache(784.0)   . Arthritis   . CAD (coronary artery disease)     sees Dr. Jenne Campus, [facility] cardi[facility], [place]  . 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 Wears glasses   . Wears hearing aid     right  . Presence of surgically created AV shunt for hemodialysis     old lt upper arm out-rt upper arm shunt in  . Anginal pain     Dr Raliegh Ip in [place], [place]  . History of kidney stones   . Constipation   . History of blood transfusion   . Asthma     years ago  . Chronic kidney disease     TTH [facility] [place], - Hemo  . Renal insufficiency     patient on dialysis for 3 years (today is 03/22/15  Past Surgical History  Procedure Laterality Date  . Abdominal hysterectomy  1976  . Kidney stone surgery  2013  . Av fistula placement   01/20/2012    Procedure: ARTERIOVENOUS (AV) FISTULA CREATION;  Surgeon: Elam Dutch, MD;  Location: [place];  Service: Vascular;  Laterality: Left;  . Hemodialysis catheter  05/25/12    secondary to failed AVF   . Eye surgery      bilateral cataract removed  . Av fistula placement  09/05/2012    Procedure: INSERTION OF ARTERIOVENOUS (AV) GORE-TEX GRAFT ARM;  Surgeon: Elam Dutch, MD;  Location: [facility] OR;  Service: Vascular;  Laterality: Left;  Using 4-58mm x 45 cm Vascular stretch goretex graft  . Ligation of arteriovenous  fistula  09/05/2012    Procedure: LIGATION OF ARTERIOVENOUS  FISTULA;  Surgeon: Elam Dutch, MD;  Location: [place];  Service: Vascular;  Laterality: Left;  . Ligation arteriovenous gortex graft  09/05/2012    Procedure: LIGATION ARTERIOVENOUS GORTEX GRAFT;  Surgeon: Elam Dutch, MD;  Location: [place];  Service: Vascular;  Laterality: Left;  . Av fistula placement Right 11/07/2012    Procedure: ARTERIOVENOUS (AV) FISTULA CREATION;  Surgeon: Elam Dutch, MD;  Location[facility];  Service: Vascular;  Laterality: Right;  Bascilic Vein Fistula  . Appendectomy    . Carpal tunnel release Left 02/10/2013    Procedure: CARPAL TUNNEL RELEASE;  Surgeon: Cammie Sickle., MD;  Location: [place];  Service: Orthopedics;  Laterality: Left;  . Bascilic vein transposition Right 03/15/2013    Procedure: 2ND STAGE BASCILIC VEIN TRANSPOSITION - RIGHT ARM;  Surgeon: Elam Dutch, MD;  Location: [place];  Service: Vascular;  Laterality: Right;  . Av fistula placement Right 05/15/2013    Procedure: INSERTION OF ARTERIOVENOUS (AV) GORE-TEX GRAFT ARM-RIGHT;  Surgeon: Elam Dutch, MD;  Location: [place];  Service: Vascular;  Laterality: Right;  . Shuntogram N/A 07/27/2012    Procedure: Earney Mallet;  Surgeon: Rosetta Posner, MD;  Location[facility] CATH LAB;  Service: Cardiovascular;  Laterality: N/A;   Family History  Problem Relation Age of Onset  . Diabetes Mother   . Cancer  Father   . Deep vein thrombosis Brother   . Hypertension Brother    History   Social History  . Marital Status: Widowed    Spouse Name: N/A  . Number of Children: N/A  . Years of Education: N/A   Occupational History  . Not on file.   Social History Main Topics  . Smoking status: Never Smoker   . Smokeless tobacco: Never Used  . Alcohol Use: No  . Drug Use: No  . Sexual Activity: Not on file   Other Topics Concern  . Not on file   Social History Narrative    Review of Systems: Review of Systems  Constitutional: Negative for fever and chills.  HENT: Negative for ear pain.   Eyes: Negative for double vision and pain.  Respiratory: Positive for cough, sputum production and shortness of breath. Negative for wheezing.   Cardiovascular: Positive for chest pain. Negative for leg swelling.       Right sided and substernal pleuritic CP  Gastrointestinal: Negative for nausea and vomiting.  Genitourinary: Negative.   Musculoskeletal: Negative.   Skin: Negative.   Neurological: Negative.  Negative for headaches.    Physical Exam: Blood pressure 172/77, pulse 99, temperature 98.3 F (36.8 C), temperature source Oral, resp. rate 15, height 5\' 2"  (1.575 m),  weight 142 lb 13.7 oz (64.8 kg), SpO2 99 %. Physical Exam  Constitutional: She is oriented to person, place, and time and well-developed, well-nourished, and in no distress.  HENT:  Head: Normocephalic and atraumatic.  Eyes: EOM are normal. Pupils are equal, round, and reactive to light.  Neck: Normal range of motion. Neck supple.  Cardiovascular: Normal rate, normal heart sounds and intact distal pulses.   Pulmonary/Chest: Effort normal.  Bibasilar crackles Decreased breath sounds in the R lower lobe   Abdominal: Soft. Bowel sounds are normal. She exhibits no distension.  Musculoskeletal: Normal range of motion. She exhibits no edema or tenderness.  Neurological: She is alert and oriented to person, place, and time. No  cranial nerve deficit.  Skin: Skin is warm and dry.    Lab results: Basic Metabolic Panel:  Recent Labs  03/22/15 1745 03/23/15 1037 03/23/15 1446  NA 134* 135 134*  K 4.0 4.5 4.8  CL 92* 95* 93*  CO2 30  --  29  GLUCOSE 165* 119* 93  BUN 18 26* 24*  CREATININE 6.62* 7.30* 7.91*  CALCIUM 9.9  --  9.6  PHOS  --   --  5.5*   Liver Function Tests:  Recent Labs  03/23/15 1446  ALBUMIN 3.0*   CBC:  Recent Labs  03/23/15 1027 03/23/15 1037 03/23/15 1446  WBC 5.0  --  4.7  NEUTROABS 3.4  --   --   HGB 11.3* 14.3 10.8*  HCT 35.5* 42.0 33.5*  MCV 85.1  --  83.3  PLT 230  --  246   Coagulation:  Recent Labs  03/23/15 1021  LABPROT 28.8*  INR 2.77*   Urine Drug Screen: Drugs of Abuse     Component Value Date/Time   LABOPIA NONE DETECTED 12/25/2014 1517   COCAINSCRNUR NONE DETECTED 12/25/2014 1517   LABBENZ NONE DETECTED 12/25/2014 1517   AMPHETMU NONE DETECTED 12/25/2014 1517   THCU NONE DETECTED 12/25/2014 1517   LABBARB NONE DETECTED 12/25/2014 1517    Imaging results:  Dg Chest 2 View  03/23/2015   CLINICAL DATA:  Chronic chest pain and shortness of breath which worsened yesterday.  EXAM: CHEST  2 VIEW  COMPARISON:  CT chest 03/22/2015 and 02/23/2015. PA and lateral chest 02/23/2015.  FINDINGS: There is marked cardiomegaly. Small right effusion and basilar airspace disease appear unchanged compared to the prior plain films. There is pulmonary vascular congestion without frank edema. No pneumothorax is identified.  IMPRESSION: No change in a small right pleural effusion and basilar airspace disease.  Cardiomegaly and vascular congestion.   Electronically Signed   By: Inge Rise M.D.   On: 03/23/2015 11:41   Ct Angio Chest Pe W/cm &/or Wo Cm  03/22/2015   CLINICAL DATA:  One day history of shortness of breath. Chronic renal failure.  EXAM: CT ANGIOGRAPHY CHEST WITH CONTRAST  TECHNIQUE: Multidetector CT imaging of the chest was performed using the  standard protocol during bolus administration of intravenous contrast. Multiplanar CT image reconstructions and MIPs were obtained to evaluate the vascular anatomy.  CONTRAST:  65mL OMNIPAQUE IOHEXOL 350 MG/ML SOLN  COMPARISON:  Chest radiograph February 23, 2015; chest CT angiogram February 23, 2015  FINDINGS: The previously noted pulmonary emboli have resolved. Currently, no demonstrable pulmonary emboli are present. There is atherosclerotic change in the aorta, but no appreciable aneurysm or dissection is visualized. The heart is enlarged with mild left ventricular hypertrophy. The pericardium is not thickened. There is atherosclerotic change at several sites in  the visualized great vessels, most notably in the proximal left subclavian artery.  There are bilateral pleural effusions with bibasilar airspace consolidation, more on the right than on the left. Some of this consolidation is due to atelectasis. Superimposed pneumonia, particularly in the right base, cannot be excluded. There is mild bibasilar interstitial edema as well.  Visualized thyroid appears normal except for a single benign-appearing calcification in the mid right lobe. There are subcentimeter mediastinal lymph nodes but no adenopathy by size criteria.  There are multiple foci of coronary artery calcification.  In the visualized upper abdomen, there are scattered calcified granulomas in the liver. There is atherosclerotic change in the aorta.  There is degenerative change in the thoracic spine. There are no blastic or lytic bone lesions.  Review of the MIP images confirms the above findings.  IMPRESSION: No demonstrable pulmonary embolus. The previously noted pulmonary emboli have resolved.  Cardiomegaly with mild interstitial edema and bilateral effusions. Evidence of a degree of congestive heart failure.  Atelectasis with questionable superimposed pneumonia in the bases, particularly in the right lower lobe region.  No appreciable adenopathy.  Multiple  foci of coronary artery calcification.   Electronically Signed   By: Lowella Grip III M.D.   On: 03/22/2015 19:57    Other results: EKG: Sinus tachycardia with 1st degree A-V block Left axis deviation Incomplete right bundle branch block Left ventricular hypertrophy with repolarization abnormality Inferior infarct , age undetermined Abnormal ECG  Echo (02/24/15): Study Conclusions  - Left ventricle: The cavity size was normal. Wall thickness was increased in a pattern of moderate LVH. Systolic function was mildly reduced. The estimated ejection fraction was in the range of 45% to 50%. Basal inferior and basal inferolateral akinesis, mid inferior and mid inferolateral hypokinesis. Indeterminant diastolic function. - Aortic valve: Trileaflet; moderately calcified leaflets. Sclerosis without stenosis. There was mild regurgitation. - Mitral valve: Mildly to moderately calcified annulus. There was moderate to severe regurgitation. This may be infarct-related given the inferior and inferolateral wall motion abnormalities. - Left atrium: The atrium was mildly dilated. - Right ventricle: The cavity size was normal. Systolic function was mildly reduced. - Tricuspid valve: Peak RV-RA gradient (S): 54 mm Hg. - Pulmonary arteries: PA peak pressure: 69 mm Hg (S). - Systemic veins: IVC measured 2.2 cm with < 50% respirophasic variation, suggesting RA pressure 15 mmHg.   Assessment & Plan by Problem: Active Problems:   HCAP (healthcare-associated pneumonia)  81 yo F COPD, HLD, DM, HTN, CAD, ESRD on HD (T,R,Sat), GIST (diagnosed 11/2014) presenting with progressive SOB for 1 week and cough since yesterday.  HCAP Patient was hospitalized last month for a PE. She presented to the hospital today with pleuritic CP, SOB, and cough with yellowish sputum. She presented to the hospital yesterday with similar complaints and was diagnosed with pneumonia. She does not have a  fever but is tachycardic. EKG does not show any acute ST or T wave changes. Troponin 0.10 likely due to demand ischemia.  CXR suggestive of pleural effusion, especially on the right. In addition, CXR shows possible right lower/ middle lobe consolidation. Echo from 6/26 shows EF in the range of 45-50%. Patients symptoms could be likely 2/2 to healthcare associated pneumonia.  -Pt started on IV levaquin today and will be reasessed tomorrow to see if symptoms improved.  -F/u Am CBC -pt is currently satting 98-99% on 2 L via [place]. Monitor O2 saturation.   ESRD Cr 7.91 today with a baseline of 7. HD today, 4  liters fluid to be removed today.  -F/U AM BMP -daily weights, i/o  -cont calcitriol and calcium acetate  -cont Rena-vit  -appreciate Neophrology recs  Chronic Anemia  Hgb 10.8 in the setting of ESRD.  -F/u CBC  COPD -cont Proventil and Pulmicort   Hx of PE  INR is 2.77.  -Coumadin per pharmacy dosing    GIST -cont Gleevec 600 mg QD -cont Tramadol 50 mg Q6 prn   HTN BP 128/66 today. -F/u BP after HD -cont Hydralazine 10 mg BID  HLD -cont ZOCOR 5 mg QHS  CAD -cont ASA  Neuropathy -cont Neurontin 300 mg QHS   Diet: renal   GI ppx: protonix  DVT ppx: coumadin   CODE:   Dispo: Disposition is deferred at this time, awaiting improvement of current medical problems. Anticipated discharge in approximately 1-2 day(s).   The patient does have a current PCP Raina Mina, MD) and does need an [facility] hospital follow-up appointment after discharge.  The patient does not have transportation limitations that hinder transportation to clinic appointments.  Signed: Shela Leff, MD 03/23/2015, 3:42 PM

## 2015-03-23 NOTE — Progress Notes (Signed)
ANTIBIOTIC CONSULT NOTE - INITIAL  Pharmacy Consult for Levaquin Indication: HCAP  No Known Allergies  Patient Measurements: Height: 5\' 2"  (157.5 cm) Weight: 150 lb (68.04 kg) IBW/kg (Calculated) : 50.1  Vital Signs: Temp: 98.2 F (36.8 C) (07/23 0944) Temp Source: Oral (07/23 0944) BP: 170/75 mmHg (07/23 1215) Pulse Rate: 103 (07/23 1215) Intake/Output from previous day:   Intake/Output from this shift:    Labs:  Recent Labs  03/22/15 1745 03/23/15 1027 03/23/15 1037  WBC 5.1 5.0  --   HGB 12.2 11.3* 14.3  PLT 190 230  --   CREATININE 6.62*  --  7.30*   Estimated Creatinine Clearance: 5.1 mL/min (by C-G formula based on Cr of 7.3). No results for input(s): VANCOTROUGH, VANCOPEAK, VANCORANDOM, GENTTROUGH, GENTPEAK, GENTRANDOM, TOBRATROUGH, TOBRAPEAK, TOBRARND, AMIKACINPEAK, AMIKACINTROU, AMIKACIN in the last 72 hours.   Microbiology: No results found for this or any previous visit (from the past 720 hour(s)).  Medical History: Past Medical History  Diagnosis Date  . COPD (chronic obstructive pulmonary disease)     emphysema  . Neuropathy   . Hyperlipidemia   . Diabetes mellitus   . Depression   . Anemia   . Shortness of breath   . GERD (gastroesophageal reflux disease)   . Hyperparathyroidism   . Hypertension     sees Dr. Gilford Rile  . Headache(784.0)   . Arthritis   . CAD (coronary artery disease)     sees Dr. Jenne Campus, [facility] cardiology [facility], [place]  . 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 Wears glasses   . Wears hearing aid     right  . Presence of surgically created AV shunt for hemodialysis     old lt upper arm out-rt upper arm shunt in  . Anginal pain     Dr Raliegh Ip in [place], [place]  . History of kidney stones   . Constipation   . History of blood transfusion   . Asthma     years ago  . Chronic kidney disease     TTH SAT [place], [place]- Hemo  . Renal insufficiency     patient on dialysis for 3 years  (today is 03/22/15    Medications:   (Not in a hospital admission) Assessment: 13 YOF who returns to the ED with sub-sternal chest pain. She was discharged yesterday on Z-Pak and Augmentin but was unable to obtain medications. CT yesterday was found to be negative for PE. Pharmacy consulted to start IV Levaquin for HCAP. WBC wnl. Pt is afebrile. She has a h/o ESRD and has been on dialysis for 3 years.   Goal of Therapy:  Resolution of infection   Plan:  -Give Levaquin 750 mg IV load followed by Levaquin 500 mg IV Q 48 hours -Monitor CBC, cultures and clinical progress  Albertina Parr, PharmD., BCPS Clinical Pharmacist Pager [phone]

## 2015-03-23 NOTE — ED Notes (Signed)
Pt was seen in ED yesterday and diagnosed with pneumonia. Pt was unable to obtain antibiotic prescriptions. Pt having similar symptoms.

## 2015-03-23 NOTE — ED Notes (Signed)
Unable to obtain IV access.

## 2015-03-23 NOTE — Procedures (Signed)
I was present at this session.  I have reviewed the session itself and made appropriate changes.  HD via RUA AVG.  BP Marland Kitchen. Vol xs.    [name] L 7/23/20162:31 PM

## 2015-03-23 NOTE — ED Notes (Signed)
Pt. Stated, chest pain with a cough.

## 2015-03-24 ENCOUNTER — Inpatient Hospital Stay (HOSPITAL_COMMUNITY): Payer: Medicare Other

## 2015-03-24 DIAGNOSIS — I129 Hypertensive chronic kidney disease with stage 1 through stage 4 chronic kidney disease, or unspecified chronic kidney disease: Secondary | ICD-10-CM

## 2015-03-24 DIAGNOSIS — R0989 Other specified symptoms and signs involving the circulatory and respiratory systems: Secondary | ICD-10-CM | POA: Insufficient documentation

## 2015-03-24 DIAGNOSIS — N186 End stage renal disease: Secondary | ICD-10-CM | POA: Insufficient documentation

## 2015-03-24 DIAGNOSIS — J209 Acute bronchitis, unspecified: Secondary | ICD-10-CM | POA: Insufficient documentation

## 2015-03-24 DIAGNOSIS — Z992 Dependence on renal dialysis: Secondary | ICD-10-CM

## 2015-03-24 LAB — BASIC METABOLIC PANEL
Anion gap: 11 (ref 5–15)
BUN: 9 mg/dL (ref 6–20)
CALCIUM: 9.4 mg/dL (ref 8.9–10.3)
CO2: 30 mmol/L (ref 22–32)
CREATININE: 4.48 mg/dL — AB (ref 0.44–1.00)
Chloride: 94 mmol/L — ABNORMAL LOW (ref 101–111)
GFR calc Af Amer: 9 mL/min — ABNORMAL LOW (ref >60)
GFR calc non Af Amer: 8 mL/min — ABNORMAL LOW (ref >60)
GLUCOSE: 88 mg/dL (ref 65–99)
Potassium: 3.8 mmol/L (ref 3.5–5.1)
Sodium: 135 mmol/L (ref 135–145)

## 2015-03-24 LAB — PROCALCITONIN: PROCALCITONIN: 0.92 ng/mL

## 2015-03-24 LAB — CBC
HEMATOCRIT: 39.8 % (ref 36.0–46.0)
Hemoglobin: 12.6 g/dL (ref 12.0–15.0)
MCH: 27 pg (ref 26.0–34.0)
MCHC: 31.7 g/dL (ref 30.0–36.0)
MCV: 85.2 fL (ref 78.0–100.0)
PLATELETS: 187 10*3/uL (ref 150–400)
RBC: 4.67 MIL/uL (ref 3.87–5.11)
RDW: 18.8 % — AB (ref 11.5–15.5)
WBC: 5.5 10*3/uL (ref 4.0–10.5)

## 2015-03-24 LAB — PROTIME-INR
INR: 3.06 — ABNORMAL HIGH (ref 0.00–1.49)
Prothrombin Time: 31 seconds — ABNORMAL HIGH (ref 11.6–15.2)

## 2015-03-24 MED ORDER — WARFARIN SODIUM 1 MG PO TABS
1.0000 mg | ORAL_TABLET | Freq: Once | ORAL | Status: AC
  Administered 2015-03-24: 1 mg via ORAL
  Filled 2015-03-24 (×2): qty 1

## 2015-03-24 MED ORDER — CALCIUM ACETATE (PHOS BINDER) 667 MG PO CAPS
1334.0000 mg | ORAL_CAPSULE | Freq: Three times a day (TID) | ORAL | Status: DC
  Administered 2015-03-24 – 2015-03-25 (×5): 1334 mg via ORAL
  Filled 2015-03-24 (×6): qty 2

## 2015-03-24 MED ORDER — IMATINIB MESYLATE 100 MG PO TABS
100.0000 mg | ORAL_TABLET | Freq: Every day | ORAL | Status: DC
  Administered 2015-03-24 – 2015-03-25 (×2): 100 mg via ORAL
  Filled 2015-03-24 (×2): qty 1

## 2015-03-24 MED ORDER — CETYLPYRIDINIUM CHLORIDE 0.05 % MT LIQD
7.0000 mL | Freq: Two times a day (BID) | OROMUCOSAL | Status: DC
  Administered 2015-03-24 – 2015-03-25 (×2): 7 mL via OROMUCOSAL

## 2015-03-24 NOTE — Progress Notes (Signed)
[facility] Progress Note  Assessment/Plan: 1. HCAP: being treated empirically on Levaquin. No fevers during night. Suspect volume overload, now resolving. Not sure much infection.  CXR, symptoms, and exam better with UF at HD,  Almost 8 lb removed, below dry wg 2. ESRD - TTS at [facility]. Heparin 5000 units q treatment. Needs longer tx and lower dry 3. Hypertension/volume - HD yesterday NET UF 3435 Post wt 63.3  BP controlled. 2 View Chest Xray improved but believe there is still pulmonary vascular congestion present. Will plan HD tomorrow for 2-3 liters tomorrow then return to TTS schedule. 4. Anemia - HGB 12.6 today. Will follow CBCs. 5. Metabolic bone disease - Ca 9.4 C Ca 10.2 Continue binders and reduce Calcitiol dose to 2 mcg PO TTS as patient is on Ca Acetate.  6. Nutrition - Renal Diet, Renal vit/nepro ordered. Optimize nutrition. 7. History of PE: Coumadin per pharmacy 8. DM: per primary 9. LV Hypertrophy: 12 lead shows L axis deviation with evidence of LVH. Slight troponin leak. Could be demand ischemia. Defer to primary. Lower vol steadily.  Annette H. Brown NP-C 03/24/2015, 7:55 AM   [facility] [phone]  Subjective:  "I guess I'm OK". Sleeping, difficult to arouse. Sleeping in low fowlers position with O2 Sat 95% on RA.  Denies Chest pain/SOB but wondered if she is fully aware of questions. Nursing assistants called to wash face and set up for breakfast. More awake now. Much less cough  Objective Filed Vitals:   03/23/15 2028 03/23/15 2041 03/24/15 0544 03/24/15 0600  BP: 149/79  121/58   Pulse: 111  97   Temp: 98.2 F (36.8 C)  97.7 F (36.5 C)   TempSrc: Axillary  Axillary   Resp: 17  16   Height:      Weight:      SpO2: 95% 97% 100% 94%   Physical Exam General: well nourished female, sleepy, initially difficult to arouse. NAD.  Heart: S1, S2, RRR No dont hear split S2/S3 today. S3 present less prominent.  Gr 2/6 M Lungs:  Bilateral breath sounds slightly decreased in bases Abdomen: soft, nontender Liver down 3-4 cm Extremities: No edema Dialysis Access: RUA AVG + Thrill + Bruit.   Dialysis Orders: Center: [facility] on TTS . EDW 65.5 kg HD Bath 2.0 K 2.0 Ca 1 mg Time 3.5 hours Heparin 5000 units per treatment. Access RUA AVG BFR 400 DFR Autoflow 1.5  Mircera 150 mcg IV q 2 weeks ( last dose 03/21/15) Calcitriol 2.25 mcg PO TTS with HD  Additional Objective Labs: Basic Metabolic Panel:  Recent Labs Lab 03/22/15 1745 03/23/15 1037 03/23/15 1446 03/24/15 0640  NA 134* 135 134* 135  K 4.0 4.5 4.8 3.8  CL 92* 95* 93* 94*  CO2 30  --  29 30  GLUCOSE 165* 119* 93 88  BUN 18 26* 24* 9  CREATININE 6.62* 7.30* 7.91* 4.48*  CALCIUM 9.9  --  9.6 9.4  PHOS  --   --  5.5*  --    Liver Function Tests:  Recent Labs Lab 03/23/15 1446  ALBUMIN 3.0*   No results for input(s): LIPASE, AMYLASE in the last 168 hours. CBC:  Recent Labs Lab 03/22/15 1745 03/23/15 1027 03/23/15 1037 03/23/15 1446 03/24/15 0640  WBC 5.1 5.0  --  4.7 5.5  NEUTROABS  --  3.4  --   --   --   HGB 12.2 11.3* 14.3 10.8* 12.6  HCT 37.7 35.5* 42.0 33.5* 39.8  MCV  84.3 85.1  --  83.3 85.2  PLT 190 230  --  246 PENDING   Blood Culture    Component Value Date/Time   SDES URINE, CATHETERIZED 12/25/2014 1517   SPECREQUEST NONE 12/25/2014 1517   CULT NO GROWTH Performed at [facility]  12/25/2014 1517   REPTSTATUS 12/27/2014 FINAL 12/25/2014 1517    Cardiac Enzymes: No results for input(s): CKTOTAL, CKMB, CKMBINDEX, TROPONINI in the last 168 hours. CBG: No results for input(s): GLUCAP in the last 168 hours. Iron Studies: No results for input(s): IRON, TIBC, TRANSFERRIN, FERRITIN in the last 72 hours. @lablastinr3 @ Studies/Results: Dg Chest 2 View  03/23/2015   CLINICAL DATA:  Chronic chest pain and shortness of breath which worsened yesterday.  EXAM: CHEST  2 VIEW  COMPARISON:  CT chest 03/22/2015 and  02/23/2015. PA and lateral chest 02/23/2015.  FINDINGS: There is marked cardiomegaly. Small right effusion and basilar airspace disease appear unchanged compared to the prior plain films. There is pulmonary vascular congestion without frank edema. No pneumothorax is identified.  IMPRESSION: No change in a small right pleural effusion and basilar airspace disease.  Cardiomegaly and vascular congestion.   Electronically Signed   By: Inge Rise M.D.   On: 03/23/2015 11:41   Ct Angio Chest Pe W/cm &/or Wo Cm  03/22/2015   CLINICAL DATA:  One day history of shortness of breath. Chronic renal failure.  EXAM: CT ANGIOGRAPHY CHEST WITH CONTRAST  TECHNIQUE: Multidetector CT imaging of the chest was performed using the standard protocol during bolus administration of intravenous contrast. Multiplanar CT image reconstructions and MIPs were obtained to evaluate the vascular anatomy.  CONTRAST:  43mL OMNIPAQUE IOHEXOL 350 MG/ML SOLN  COMPARISON:  Chest radiograph February 23, 2015; chest CT angiogram February 23, 2015  FINDINGS: The previously noted pulmonary emboli have resolved. Currently, no demonstrable pulmonary emboli are present. There is atherosclerotic change in the aorta, but no appreciable aneurysm or dissection is visualized. The heart is enlarged with mild left ventricular hypertrophy. The pericardium is not thickened. There is atherosclerotic change at several sites in the visualized great vessels, most notably in the proximal left subclavian artery.  There are bilateral pleural effusions with bibasilar airspace consolidation, more on the right than on the left. Some of this consolidation is due to atelectasis. Superimposed pneumonia, particularly in the right base, cannot be excluded. There is mild bibasilar interstitial edema as well.  Visualized thyroid appears normal except for a single benign-appearing calcification in the mid right lobe. There are subcentimeter mediastinal lymph nodes but no adenopathy by  size criteria.  There are multiple foci of coronary artery calcification.  In the visualized upper abdomen, there are scattered calcified granulomas in the liver. There is atherosclerotic change in the aorta.  There is degenerative change in the thoracic spine. There are no blastic or lytic bone lesions.  Review of the MIP images confirms the above findings.  IMPRESSION: No demonstrable pulmonary embolus. The previously noted pulmonary emboli have resolved.  Cardiomegaly with mild interstitial edema and bilateral effusions. Evidence of a degree of congestive heart failure.  Atelectasis with questionable superimposed pneumonia in the bases, particularly in the right lower lobe region.  No appreciable adenopathy.  Multiple foci of coronary artery calcification.   Electronically Signed   By: Lowella Grip III M.D.   On: 03/22/2015 19:57   Medications:   . antiseptic oral rinse  7 mL Mouth Rinse BID  . aspirin EC  81 mg Oral QHS  . budesonide (  PULMICORT) nebulizer solution  0.25 mg Nebulization BID  . [START ON 03/26/2015] calcitRIOL  2 mcg Oral Q T,Th,Sa-HD  . calcium acetate  667 mg Oral TID WC  . feeding supplement (NEPRO CARB STEADY)  237 mL Oral BID BM  . gabapentin  300 mg Oral QHS  . hydrALAZINE  10 mg Oral BID  . imatinib  100 mg Oral Daily  . [START ON 03/25/2015] levofloxacin (LEVAQUIN) IV  500 mg Intravenous Q48H  . multivitamin  1 tablet Oral QHS  . pantoprazole  40 mg Oral Daily  . simvastatin  5 mg Oral QHS  . Warfarin - Pharmacist Dosing Inpatient   Does not apply [phone]

## 2015-03-24 NOTE — Progress Notes (Signed)
ANTICOAGULATION CONSULT NOTE - Follow Up Consult  Pharmacy Consult for Warfarin Indication: Hx recent PE (June 2016)  No Known Allergies  Patient Measurements: Height: 5\' 2"  (157.5 cm) Weight: 139 lb 8.8 oz (63.3 kg) IBW/kg (Calculated) : 50.1  Vital Signs: Temp: 97.7 F (36.5 C) (07/24 0544) Temp Source: Axillary (07/24 0544) BP: 121/58 mmHg (07/24 0544) Pulse Rate: 97 (07/24 0544)  Labs:  Recent Labs  03/23/15 1021 03/23/15 1027 03/23/15 1037 03/23/15 1446 03/24/15 0640  HGB  --  11.3* 14.3 10.8* 12.6  HCT  --  35.5* 42.0 33.5* 39.8  PLT  --  230  --  246 PENDING  LABPROT 28.8*  --   --   --  31.0*  INR 2.77*  --   --   --  3.06*  CREATININE  --   --  7.30* 7.91* 4.48*    Estimated Creatinine Clearance: 8 mL/min (by C-G formula based on Cr of 4.48).   Assessment: 82 YOF on warfarin PTA for hx recent PE in June '16 who presented today with SOB concerning for PNA. The patient's INR was therapeutic on admission (INR 2.77) on PTA dose of 7.5 mg daily. Last dose PTA was on 7/22. Pharmacy has been consulted to resume warfarin dosing this admission.  The patient has started on Levaquin for empiric HCAP coverage which will increase warfarin sensitivity. No overt s/sx of bleeding noted at this time.   INR has increased to 3.06.  Will reduce Coumadin dose tonight.  Goal of Therapy:  INR 2-3   Plan:  1. Warfarin 1 mg x 1 dose at 1800 2. Daily PT/INR 3. Will continue to monitor for any signs/symptoms of bleeding and will follow up with PT/INR in the a.m.   [employer], Pharm.D., BCPS Clinical Pharmacist Pager [phone] 03/24/2015 8:01 AM

## 2015-03-24 NOTE — Progress Notes (Signed)
Subjective: Patient doing okay today. Breathing improved, lungs sound better. No fever, chills, or significant SOB.   Objective: Vital signs in last 24 hours: Filed Vitals:   03/23/15 2041 03/24/15 0544 03/24/15 0600 03/24/15 1111  BP:  121/58    Pulse:  97    Temp:  97.7 F (36.5 C)    TempSrc:  Axillary    Resp:  16    Height:      Weight:      SpO2: 97% 100% 94% 92%   Weight change:   Intake/Output Summary (Last 24 hours) at 03/24/15 1411 Last data filed at 03/24/15 1002  Gross per 24 hour  Intake    360 ml  Output   3435 ml  Net  -3075 ml   Physical Exam: General: [place] female, alert, cooperative, NAD. HEENT: PERRL, EOMI. Moist mucus membranes Neck: Full range of motion without pain, supple, no lymphadenopathy or carotid bruits Lungs: Decreased breath sounds in RLL. Scant bibasilar rales. No wheezes or rhonchi.  Heart: RRR, no murmurs, gallops, or rubs Abdomen: Soft, non-tender, non-distended, BS + Extremities: No cyanosis, clubbing, or edema Neurologic: Alert & oriented X3, cranial nerves II-XII intact, strength grossly intact, sensation intact to light touch   Lab Results: Basic Metabolic Panel:  Recent Labs Lab 03/23/15 1446 03/24/15 0640  NA 134* 135  K 4.8 3.8  CL 93* 94*  CO2 29 30  GLUCOSE 93 88  BUN 24* 9  CREATININE 7.91* 4.48*  CALCIUM 9.6 9.4  PHOS 5.5*  --    Liver Function Tests:  Recent Labs Lab 03/23/15 1446  ALBUMIN 3.0*   CBC:  Recent Labs Lab 03/23/15 1027  03/23/15 1446 03/24/15 0640  WBC 5.0  --  4.7 5.5  NEUTROABS 3.4  --   --   --   HGB 11.3*  < > 10.8* 12.6  HCT 35.5*  < > 33.5* 39.8  MCV 85.1  --  83.3 85.2  PLT 230  --  246 187  < > = values in this interval not displayed.  Coagulation:  Recent Labs Lab 03/23/15 1021 03/24/15 0640  LABPROT 28.8* 31.0*  INR 2.77* 3.06*   Studies/Results: Dg Chest 2 View  03/24/2015   CLINICAL DATA:  Healthcare associated pneumonia  EXAM: CHEST  2 VIEW   COMPARISON:  03/23/2015  FINDINGS: Marked enlargement of the cardiomediastinal silhouette is reidentified. Improved right perihilar/ basilar aeration. Small pleural effusions persist. No acute osseous abnormality.  IMPRESSION: Cardiomegaly with improved right basilar aeration.   Electronically Signed   By: Conchita Paris M.D.   On: 03/24/2015 10:10   Dg Chest 2 View  03/23/2015   CLINICAL DATA:  Chronic chest pain and shortness of breath which worsened yesterday.  EXAM: CHEST  2 VIEW  COMPARISON:  CT chest 03/22/2015 and 02/23/2015. PA and lateral chest 02/23/2015.  FINDINGS: There is marked cardiomegaly. Small right effusion and basilar airspace disease appear unchanged compared to the prior plain films. There is pulmonary vascular congestion without frank edema. No pneumothorax is identified.  IMPRESSION: No change in a small right pleural effusion and basilar airspace disease.  Cardiomegaly and vascular congestion.   Electronically Signed   By: Inge Rise M.D.   On: 03/23/2015 11:41   Ct Angio Chest Pe W/cm &/or Wo Cm  03/22/2015   CLINICAL DATA:  One day history of shortness of breath. Chronic renal failure.  EXAM: CT ANGIOGRAPHY CHEST WITH CONTRAST  TECHNIQUE: Multidetector CT imaging of the chest was  performed using the standard protocol during bolus administration of intravenous contrast. Multiplanar CT image reconstructions and MIPs were obtained to evaluate the vascular anatomy.  CONTRAST:  16mL OMNIPAQUE IOHEXOL 350 MG/ML SOLN  COMPARISON:  Chest radiograph February 23, 2015; chest CT angiogram February 23, 2015  FINDINGS: The previously noted pulmonary emboli have resolved. Currently, no demonstrable pulmonary emboli are present. There is atherosclerotic change in the aorta, but no appreciable aneurysm or dissection is visualized. The heart is enlarged with mild left ventricular hypertrophy. The pericardium is not thickened. There is atherosclerotic change at several sites in the visualized great  vessels, most notably in the proximal left subclavian artery.  There are bilateral pleural effusions with bibasilar airspace consolidation, more on the right than on the left. Some of this consolidation is due to atelectasis. Superimposed pneumonia, particularly in the right base, cannot be excluded. There is mild bibasilar interstitial edema as well.  Visualized thyroid appears normal except for a single benign-appearing calcification in the mid right lobe. There are subcentimeter mediastinal lymph nodes but no adenopathy by size criteria.  There are multiple foci of coronary artery calcification.  In the visualized upper abdomen, there are scattered calcified granulomas in the liver. There is atherosclerotic change in the aorta.  There is degenerative change in the thoracic spine. There are no blastic or lytic bone lesions.  Review of the MIP images confirms the above findings.  IMPRESSION: No demonstrable pulmonary embolus. The previously noted pulmonary emboli have resolved.  Cardiomegaly with mild interstitial edema and bilateral effusions. Evidence of a degree of congestive heart failure.  Atelectasis with questionable superimposed pneumonia in the bases, particularly in the right lower lobe region.  No appreciable adenopathy.  Multiple foci of coronary artery calcification.   Electronically Signed   By: Lowella Grip III M.D.   On: 03/22/2015 19:57   Medications: I have reviewed the patient's current medications. Scheduled Meds: . antiseptic oral rinse  7 mL Mouth Rinse BID  . aspirin EC  81 mg Oral QHS  . budesonide (PULMICORT) nebulizer solution  0.25 mg Nebulization BID  . [START ON 03/26/2015] calcitRIOL  2 mcg Oral Q T,Th,Sa-HD  . calcium acetate  1,334 mg Oral TID WC  . feeding supplement (NEPRO CARB STEADY)  237 mL Oral BID BM  . hydrALAZINE  10 mg Oral BID  . imatinib  100 mg Oral Daily  . [START ON 03/25/2015] levofloxacin (LEVAQUIN) IV  500 mg Intravenous Q48H  . multivitamin  1  tablet Oral QHS  . pantoprazole  40 mg Oral Daily  . simvastatin  5 mg Oral QHS  . warfarin  1 mg Oral ONCE-1800  . Warfarin - Pharmacist Dosing Inpatient   Does not apply q1800   Continuous Infusions:  PRN Meds:.albuterol, docusate sodium, nitroGLYCERIN, traMADol   Assessment/Plan: Ms. Annette Roach is a 81 y.o. 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   HCAP: Patient w/ SOB and productive cough on admission. No fever or leukocytosis. CXR significant for RLL infiltrate and known RLL pleural effusion. Started on Levaquin IV given recent admission and outpatient HD, therefore patient qualifies for having HCAP. Breathing improved today s/p HD. Suspect much of pulmonary findings are related to volume overload rather than true pneumonia. CXR repeated this AM, shows significant improvement in RLL.  -Continue Levaquin for now -Procalcitonin pending -HD per renal (tomorrow; low  volume) -CBC in AM -Supplemental O2 -Albuterol prn  ESRD on HD (TTS): Appreciate renal recs.  -HD in AM -Phoslo, MV  GIST Tumor: On Gleevec. Follows w/ Dr. Anabel Bene in [place]. Relatively recent diagnosis. Tumor thought to be unresectable, now on [place] for life.  -Continue Gleevec  PE: Diagnosed in 01/2015. Started on Coumadin. Recent CT chest on 03/22/15 shows no new PE. INR trend as follows:   Recent Labs Lab 03/23/15 1021 03/24/15 0640  INR 2.77* 3.06*  -Continue Coumadin  HTN: Stable.  -Hydralazine  CAD: Mild troponin leak on admission, possible demand ischemia vs spurious elevation in the setting of ESRD. No chest pain.  -Continue ASA, Zocor  DVT/PE PPx: Coumadin.   Dispo: Disposition is deferred at this time, awaiting improvement of current medical problems.  Anticipated discharge in approximately 1-2 day(s).   The patient does have a current PCP Raina Mina, MD) and  does not need an [facility] hospital follow-up appointment after discharge.  The patient does not have transportation limitations that hinder transportation to clinic appointments.  .Services Needed at time of discharge: Y = Yes, Blank = No PT:   OT:   RN:   Equipment:   Other:     LOS: 1 day   Corky Sox, MD 03/24/2015, 2:11 PM

## 2015-03-25 ENCOUNTER — Encounter (HOSPITAL_COMMUNITY): Payer: Self-pay | Admitting: General Practice

## 2015-03-25 DIAGNOSIS — I2510 Atherosclerotic heart disease of native coronary artery without angina pectoris: Secondary | ICD-10-CM

## 2015-03-25 DIAGNOSIS — D481 Neoplasm of uncertain behavior of connective and other soft tissue: Secondary | ICD-10-CM

## 2015-03-25 DIAGNOSIS — I2699 Other pulmonary embolism without acute cor pulmonale: Secondary | ICD-10-CM

## 2015-03-25 DIAGNOSIS — I120 Hypertensive chronic kidney disease with stage 5 chronic kidney disease or end stage renal disease: Secondary | ICD-10-CM

## 2015-03-25 DIAGNOSIS — Z7982 Long term (current) use of aspirin: Secondary | ICD-10-CM

## 2015-03-25 DIAGNOSIS — Z7951 Long term (current) use of inhaled steroids: Secondary | ICD-10-CM

## 2015-03-25 DIAGNOSIS — J209 Acute bronchitis, unspecified: Secondary | ICD-10-CM

## 2015-03-25 DIAGNOSIS — E785 Hyperlipidemia, unspecified: Secondary | ICD-10-CM

## 2015-03-25 DIAGNOSIS — N186 End stage renal disease: Secondary | ICD-10-CM

## 2015-03-25 DIAGNOSIS — J449 Chronic obstructive pulmonary disease, unspecified: Secondary | ICD-10-CM

## 2015-03-25 DIAGNOSIS — G629 Polyneuropathy, unspecified: Secondary | ICD-10-CM

## 2015-03-25 DIAGNOSIS — D631 Anemia in chronic kidney disease: Secondary | ICD-10-CM

## 2015-03-25 DIAGNOSIS — Z992 Dependence on renal dialysis: Secondary | ICD-10-CM

## 2015-03-25 DIAGNOSIS — Z7901 Long term (current) use of anticoagulants: Secondary | ICD-10-CM

## 2015-03-25 LAB — RENAL FUNCTION PANEL
Albumin: 3 g/dL — ABNORMAL LOW (ref 3.5–5.0)
Anion gap: 14 (ref 5–15)
BUN: 27 mg/dL — ABNORMAL HIGH (ref 6–20)
CO2: 26 mmol/L (ref 22–32)
Calcium: 9.5 mg/dL (ref 8.9–10.3)
Chloride: 92 mmol/L — ABNORMAL LOW (ref 101–111)
Creatinine, Ser: 6.74 mg/dL — ABNORMAL HIGH (ref 0.44–1.00)
GFR, EST AFRICAN AMERICAN: 6 mL/min — AB (ref >60)
GFR, EST NON AFRICAN AMERICAN: 5 mL/min — AB (ref >60)
Glucose, Bld: 117 mg/dL — ABNORMAL HIGH (ref 65–99)
PHOSPHORUS: 5 mg/dL — AB (ref 2.5–4.6)
POTASSIUM: 4.9 mmol/L (ref 3.5–5.1)
Sodium: 132 mmol/L — ABNORMAL LOW (ref 135–145)

## 2015-03-25 LAB — CBC
HEMATOCRIT: 37.8 % (ref 36.0–46.0)
Hemoglobin: 12.2 g/dL (ref 12.0–15.0)
MCH: 27.2 pg (ref 26.0–34.0)
MCHC: 32.3 g/dL (ref 30.0–36.0)
MCV: 84.2 fL (ref 78.0–100.0)
PLATELETS: 246 10*3/uL (ref 150–400)
RBC: 4.49 MIL/uL (ref 3.87–5.11)
RDW: 18.4 % — AB (ref 11.5–15.5)
WBC: 7.6 10*3/uL (ref 4.0–10.5)

## 2015-03-25 LAB — PROTIME-INR
INR: 2.97 — AB (ref 0.00–1.49)
Prothrombin Time: 30.4 seconds — ABNORMAL HIGH (ref 11.6–15.2)

## 2015-03-25 LAB — GLUCOSE, CAPILLARY: Glucose-Capillary: 117 mg/dL — ABNORMAL HIGH (ref 65–99)

## 2015-03-25 MED ORDER — LIDOCAINE HCL (PF) 1 % IJ SOLN: 5.0000 mL | INTRAMUSCULAR | Status: DC | PRN

## 2015-03-25 MED ORDER — SODIUM CHLORIDE 0.9 % IV SOLN: 100.0000 mL | INTRAVENOUS | Status: DC | PRN

## 2015-03-25 MED ORDER — HEPARIN SODIUM (PORCINE) 1000 UNIT/ML DIALYSIS
5000.0000 [IU] | Freq: Once | INTRAMUSCULAR | Status: DC
  Filled 2015-03-25: qty 5

## 2015-03-25 MED ORDER — WARFARIN SODIUM 1 MG PO TABS
1.0000 mg | ORAL_TABLET | Freq: Once | ORAL | Status: AC
  Administered 2015-03-25: 1 mg via ORAL
  Filled 2015-03-25: qty 1

## 2015-03-25 MED ORDER — ALTEPLASE 2 MG IJ SOLR
2.0000 mg | Freq: Once | INTRAMUSCULAR | Status: DC | PRN
  Filled 2015-03-25: qty 2

## 2015-03-25 MED ORDER — TRAMADOL HCL 50 MG PO TABS
ORAL_TABLET | ORAL | Status: AC
Start: 2015-03-25 — End: 2015-03-25
  Administered 2015-03-25: 50 mg via ORAL
  Filled 2015-03-25: qty 1

## 2015-03-25 MED ORDER — NEPRO/CARBSTEADY PO LIQD
237.0000 mL | ORAL | Status: DC | PRN
  Filled 2015-03-25: qty 237

## 2015-03-25 MED ORDER — LIDOCAINE-PRILOCAINE 2.5-2.5 % EX CREA
1.0000 "application " | TOPICAL_CREAM | CUTANEOUS | Status: DC | PRN
  Filled 2015-03-25: qty 5

## 2015-03-25 MED ORDER — PENTAFLUOROPROP-TETRAFLUOROETH EX AERO: 1.0000 "application " | INHALATION_SPRAY | CUTANEOUS | Status: DC | PRN

## 2015-03-25 MED ORDER — LEVOFLOXACIN 500 MG PO TABS: 500.0000 mg | ORAL_TABLET | Freq: Once | ORAL | Status: DC

## 2015-03-25 MED ORDER — LEVOFLOXACIN 500 MG PO TABS
500.0000 mg | ORAL_TABLET | ORAL | Status: DC
  Administered 2015-03-25: 500 mg via ORAL
  Filled 2015-03-25: qty 1

## 2015-03-25 MED ORDER — HEPARIN SODIUM (PORCINE) 1000 UNIT/ML DIALYSIS
1000.0000 [IU] | INTRAMUSCULAR | Status: DC | PRN
  Filled 2015-03-25: qty 1

## 2015-03-25 NOTE — Progress Notes (Signed)
Pt returned from HD in stable condition. Was given 6 pm dose of coumadin per MD order, then ready for D/C. Discharge teaching was given to pt and daughter at bedside, all questions answered. Peripheral IV removed, belongings gathered and removed by daughter. Pt wheeled out by NT.  Annette Roach 03/25/2015

## 2015-03-25 NOTE — Progress Notes (Signed)
Patient seen and examined. Case d/w residents in detail. I agree with findings and plan as documented in Dr. Elon Jester note.   Patient feels well today. No SOB or cough. Still with decreased breath sounds b/l lung bases R>L. No LE edema. Patient will complete 5 day course of PO levaquin for possible HCAP. Patient also scheduled for additional HD today per nephrology given fluid overload and will likely be stable for d/c home today after HD. She will resume home HD schedule starting tomorrow.

## 2015-03-25 NOTE — Discharge Instructions (Signed)
Follow up appointment:  Dr. Bea Graff on 04/02/15 at 10:30 am  [address] [place] [address] [phone]  Medication:  Take Levaquin 500 mg 1 tablet by mouth once on 03/27/15.   Coumadin:  Do not take Coumadin tomorrow 03/26/15. Take 1/2 tablet of Coumadin (2.5 mg) by mouth on 03/27/15. Do not take Coumadin on 03/28/15. Resume taking home dose of Coumadin - 1.5 tablets (7.5 mg) by mouth starting 03/29/15.     Pneumonia Pneumonia is an infection of the lungs.  CAUSES Pneumonia may be caused by bacteria or a virus. Usually, these infections are caused by breathing infectious particles into the lungs (respiratory tract). SIGNS AND SYMPTOMS   Cough.  Fever.  Chest pain.  Increased rate of breathing.  Wheezing.  Mucus production. DIAGNOSIS  If you have the common symptoms of pneumonia, your health care provider will typically confirm the diagnosis with a chest X-ray. The X-ray will show an abnormality in the lung (pulmonary infiltrate) if you have pneumonia. Other tests of your blood, urine, or sputum may be done to find the specific cause of your pneumonia. Your health care provider may also do tests (blood gases or pulse oximetry) to see how well your lungs are working. TREATMENT  Some forms of pneumonia may be spread to other people when you cough or sneeze. You may be asked to wear a mask before and during your exam. Pneumonia that is caused by bacteria is treated with antibiotic medicine. Pneumonia that is caused by the influenza virus may be treated with an antiviral medicine. Most other viral infections must run their course. These infections will not respond to antibiotics.  HOME CARE INSTRUCTIONS   Cough suppressants may be used if you are losing too much rest. However, coughing protects you by clearing your lungs. You should avoid using cough suppressants if you can.  Your health care provider may have prescribed medicine if he or she thinks your pneumonia is caused  by bacteria or influenza. Finish your medicine even if you start to feel better.  Your health care provider may also prescribe an expectorant. This loosens the mucus to be coughed up.  Take medicines only as directed by your health care provider.  Do not smoke. Smoking is a common cause of bronchitis and can contribute to pneumonia. If you are a smoker and continue to smoke, your cough may last several weeks after your pneumonia has cleared.  A cold steam vaporizer or humidifier in your room or home may help loosen mucus.  Coughing is often worse at night. Sleeping in a semi-upright position in a recliner or using a couple pillows under your head will help with this.  Get rest as you feel it is needed. Your body will usually let you know when you need to rest. PREVENTION A pneumococcal shot (vaccine) is available to prevent a common bacterial cause of pneumonia. This is usually suggested for:  People over 53 years old.  Patients on chemotherapy.  People with chronic lung problems, such as bronchitis or emphysema.  People with immune system problems. If you are over 65 or have a high risk condition, you may receive the pneumococcal vaccine if you have not received it before. In some countries, a routine influenza vaccine is also recommended. This vaccine can help prevent some cases of pneumonia.You may be offered the influenza vaccine as part of your care. If you smoke, it is time to quit. You may receive instructions on how to stop smoking. Your health  care provider can provide medicines and counseling to help you quit. SEEK MEDICAL CARE IF: You have a fever. SEEK IMMEDIATE MEDICAL CARE IF:   Your illness becomes worse. This is especially true if you are elderly or weakened from any other disease.  You cannot control your cough with suppressants and are losing sleep.  You begin coughing up blood.  You develop pain which is getting worse or is uncontrolled with medicines.  Any of  the symptoms which initially brought you in for treatment are getting worse rather than better.  You develop shortness of breath or chest pain. MAKE SURE YOU:   Understand these instructions.  Will watch your condition.  Will get help right away if you are not doing well or get worse. Document Released: 08/17/2005 Document Revised: 01/01/2014 Document Reviewed: 11/06/2010 [facility] Patient Information 2015 [place], [place]. This information is not intended to replace advice given to you by your health care provider. Make sure you discuss any questions you have with your health care provider.

## 2015-03-25 NOTE — Discharge Summary (Signed)
Name: Annette Roach MRN: [id number] DOB: [birth year] 81 y.o. PCP: Annette Mina, MD  Date of Admission: 03/23/2015  9:52 AM Date of Discharge: 03/30/2015 Attending Physician: No att. providers found  Discharge Diagnosis:  Active Problems:   HCAP (healthcare-associated pneumonia)   Acute bronchitis   ESRD on dialysis   Pulmonary vascular congestion  Discharge Medications:   Medication List    STOP taking these medications        amoxicillin-clavulanate 500-125 MG per tablet  Commonly known as:  AUGMENTIN     azithromycin 250 MG tablet  Commonly known as:  ZITHROMAX      TAKE these medications        VENTOLIN HFA 108 (90 BASE) MCG/ACT inhaler  Generic drug:  albuterol  Inhale 2 puffs into the lungs every 4 (four) hours as needed for wheezing or shortness of breath.     albuterol (2.5 MG/3ML) 0.083% nebulizer solution  Commonly known as:  PROVENTIL  Take 2.5 mg by nebulization every 6 (six) hours as needed for wheezing or shortness of breath.     Azelastine HCl 0.15 % Soln  Place 1 spray into both nostrils daily.     beclomethasone 80 MCG/ACT inhaler  Commonly known as:  QVAR  Inhale 2 puffs into the lungs 2 (two) times daily.     benzonatate 100 MG capsule  Commonly known as:  TESSALON  Take 100 mg by mouth 2 (two) times daily as needed for cough.     calcium acetate 667 MG capsule  Commonly known as:  PHOSLO  Take 667-1,334 mg by mouth 3 (three) times daily with meals. 1 capsule (667mg ) with breakfast and lunch, 2 capsules (1334mg ) with dinner     feeding supplement (NEPRO CARB STEADY) Liqd  Take 237 mLs by mouth as needed (missed meal during dialysis.).     hydrALAZINE 10 MG tablet  Commonly known as:  APRESOLINE  Take 10 mg by mouth 2 (two) times daily.     imatinib 100 MG tablet  Commonly known as:  GLEEVEC  Take 100 mg by mouth daily. Take at 3:30pm daily     levofloxacin 500 MG tablet  Commonly known as:  LEVAQUIN  Take 1 tablet (500 mg total)  by mouth once. Take on 03/27/15     multivitamin Tabs tablet  Take 1 tablet by mouth daily.     nitroGLYCERIN 0.4 MG SL tablet  Commonly known as:  NITROSTAT  Place 0.4 mg under the tongue every 5 (five) minutes as needed for chest pain.     pantoprazole 40 MG tablet  Commonly known as:  PROTONIX  Take 40 mg by mouth daily.     polyethylene glycol packet  Commonly known as:  MIRALAX / GLYCOLAX  Take 17 g by mouth daily.     prochlorperazine 10 MG tablet  Commonly known as:  COMPAZINE  TAKE 1 TABLET EVERY 6 HOURS AS NEEDED FOR CHEMO NAUSEA-MAY TAKE AROUND THE CLOCK FOR PERSISTENT SYMP     sennosides-docusate sodium 8.6-50 MG tablet  Commonly known as:  SENOKOT-S  Take 1 tablet by mouth 2 (two) times daily as needed for constipation.     traMADol 50 MG tablet  Commonly known as:  ULTRAM  Take 50 mg by mouth every 6 (six) hours as needed for moderate pain.     warfarin 5 MG tablet  Commonly known as:  COUMADIN  Take 1.5 tablets (7.5 mg total) by mouth one time only at 6  PM.        Disposition and follow-up:   Ms.Annette Roach was discharged from [facility] in Stable condition.  At the hospital follow up visit please address:  1.  HCAP Any cough, SOB, or pleuritic chest pain? Any fever or chills?   ESRD on HD Any SOB, cough with sputum, orthopnea, or lower extremity edema?  Hx of PE Pt on Levaquin for HCAP which can increase warfarin sensitivity. INR was 2.97 on 7/25. She is currently on Coumadin and needs close monitoring of INR.  2.  Labs / imaging needed at time of follow-up: PT/ INR, CBC, BMP  3.  Pending labs/ test needing follow-up: None  Follow-up Appointments: Follow-up Information    Follow up with Annette Rile, MD. Go on 04/02/2015.   Specialty:  Internal Medicine   Why:  Appointment at 10:30 am   Contact information:   [address] [place] [address] [phone]       Discharge Instructions: Discharge Instructions    Diet  - low sodium heart healthy    Complete by:  As directed      Increase activity slowly    Complete by:  As directed            Consultations:  Dr. Jeneen Rinks Roach (Nephrology)   Procedures Performed:  Dg Chest 2 View  03/24/2015   CLINICAL DATA:  Healthcare associated pneumonia  EXAM: CHEST  2 VIEW  COMPARISON:  03/23/2015  FINDINGS: Marked enlargement of the cardiomediastinal silhouette is reidentified. Improved right perihilar/ basilar aeration. Small pleural effusions persist. No acute osseous abnormality.  IMPRESSION: Cardiomegaly with improved right basilar aeration.   Electronically Signed   By: Conchita Paris M.D.   On: 03/24/2015 10:10   Dg Chest 2 View  03/23/2015   CLINICAL DATA:  Chronic chest pain and shortness of breath which worsened yesterday.  EXAM: CHEST  2 VIEW  COMPARISON:  CT chest 03/22/2015 and 02/23/2015. PA and lateral chest 02/23/2015.  FINDINGS: There is marked cardiomegaly. Small right effusion and basilar airspace disease appear unchanged compared to the prior plain films. There is pulmonary vascular congestion without frank edema. No pneumothorax is identified.  IMPRESSION: No change in a small right pleural effusion and basilar airspace disease.  Cardiomegaly and vascular congestion.   Electronically Signed   By: Inge Rise M.D.   On: 03/23/2015 11:41   Ct Angio Chest Pe W/cm &/or Wo Cm  03/22/2015   CLINICAL DATA:  One day history of shortness of breath. Chronic renal failure.  EXAM: CT ANGIOGRAPHY CHEST WITH CONTRAST  TECHNIQUE: Multidetector CT imaging of the chest was performed using the standard protocol during bolus administration of intravenous contrast. Multiplanar CT image reconstructions and MIPs were obtained to evaluate the vascular anatomy.  CONTRAST:  8mL OMNIPAQUE IOHEXOL 350 MG/ML SOLN  COMPARISON:  Chest radiograph February 23, 2015; chest CT angiogram February 23, 2015  FINDINGS: The previously noted pulmonary emboli have resolved. Currently, no  demonstrable pulmonary emboli are present. There is atherosclerotic change in the aorta, but no appreciable aneurysm or dissection is visualized. The heart is enlarged with mild left ventricular hypertrophy. The pericardium is not thickened. There is atherosclerotic change at several sites in the visualized great vessels, most notably in the proximal left subclavian artery.  There are bilateral pleural effusions with bibasilar airspace consolidation, more on the right than on the left. Some of this consolidation is due to atelectasis. Superimposed pneumonia, particularly in the right base, cannot  be excluded. There is mild bibasilar interstitial edema as well.  Visualized thyroid appears normal except for a single benign-appearing calcification in the mid right lobe. There are subcentimeter mediastinal lymph nodes but no adenopathy by size criteria.  There are multiple foci of coronary artery calcification.  In the visualized upper abdomen, there are scattered calcified granulomas in the liver. There is atherosclerotic change in the aorta.  There is degenerative change in the thoracic spine. There are no blastic or lytic bone lesions.  Review of the MIP images confirms the above findings.  IMPRESSION: No demonstrable pulmonary embolus. The previously noted pulmonary emboli have resolved.  Cardiomegaly with mild interstitial edema and bilateral effusions. Evidence of a degree of congestive heart failure.  Atelectasis with questionable superimposed pneumonia in the bases, particularly in the right lower lobe region.  No appreciable adenopathy.  Multiple foci of coronary artery calcification.   Electronically Signed   By: Lowella Grip III M.D.   On: 03/22/2015 19:57    Echo (02/24/15): Study Conclusions  - Left ventricle: The cavity size was normal. Wall thickness was increased in a pattern of moderate LVH. Systolic function was mildly reduced. The estimated ejection fraction was in the range of 45%  to 50%. Basal inferior and basal inferolateral akinesis, mid inferior and mid inferolateral hypokinesis. Indeterminant diastolic function. - Aortic valve: Trileaflet; moderately calcified leaflets. Sclerosis without stenosis. There was mild regurgitation. - Mitral valve: Mildly to moderately calcified annulus. There was moderate to severe regurgitation. This may be infarct-related given the inferior and inferolateral wall motion abnormalities. - Left atrium: The atrium was mildly dilated. - Right ventricle: The cavity size was normal. Systolic function was mildly reduced. - Tricuspid valve: Peak RV-RA gradient (S): 54 mm Hg. - Pulmonary arteries: PA peak pressure: 69 mm Hg (S). - Systemic veins: IVC measured 2.2 cm with < 50% respirophasic variation, suggesting RA pressure 15 mmHg.   Admission HPI: 81 yo F COPD, HLD, DM, HTN, CAD, ESRD on HD (T,R,Sat), GIST (diagnosed 11/2014) presenting with progressive SOB for 1 week and cough since yesterday. Patient states she has sharp/ stabbing substernal and right sided chest pain only when she is coughing. States she is coughing up yellowish sputum.Denies any pressure-like chest pain, nausea, dizziness or diaphoresis. Denies any fevers or chills. Pt daughter states the pt goes to [place] for dialysis (Dr. Justin Mend) and that she was last dialyzed on Thursday.   Of note: Pt presented last month with chest pain and was found to have a small left-sided pulmonary embolism. She was started on Lovenox and bridge onto Coumadin, has been doing well over the last month until she developed recurrent chest pain and presented to the ED yesterday 7/22 with complain of gradual onset substernal CP and cough. She was tachycardic, vitals otherwise stable. CT negative for PE. Troponin 0.09 but was also elevated a month ago as per ED note. She was diagnosed with pneumonia and given Z-Pak as normally prescribed and Augmentin regular release 500 mg every 24 hours. Pt  was discharged home and asked to f/u with PCP/ return to hospital if symptoms worsened.   Hospital Course by problem list: Active Problems:   HCAP (healthcare-associated pneumonia)   Acute bronchitis   ESRD on dialysis   Pulmonary vascular congestion   1. HCAP Pt admitted for pleuritic CP, SOB, and cough with yellowish sputum. She was tachycardic, however, did not have a fever. No leukocytosis. CXR suggestive of right lower lobe infiltrate and known right lower lobe pleural  effusion. Pt was admitted to hospital in June for a PE. Procalcitonin 0.92. Her symptoms are most likely 2/2 healthcare associated pneumonia. She was started on IV Levaquin and albuterol prn upon admission. Breathing improved s/p HD on 7/23. Repeat CXR on 7/24 showed significant improvement in the RLL. Volume overload could also have contributed to her pulmonary findings. Pt was feeling well on day of discharge (7/25) and was not complaining of any CP, cough, fever, chills, or SOB. O2 saturation 92-99% on RA. She was switched from IV to oral Levaquin 500 mg, which is to be continued as outpatient for a full 5 day course. She received HD again on 7/25 prior to discharge.    ESRD on HD (TTS) Pt was continued on calcitriol, calcium acetate, and Rena-vit. She received HD on 7/23 and 7/25. She is to follow up with Nephrology  and continue HD as outpatient.   Chronic Anemia  Hgb 10.8 in the setting of ESRD. Pt following up with Nephrology as outpatient.   COPD -cont Proventil and Pulmicort   Hx of PE  INR was 2.97 on 7/25. She received Coumadin as per pharmacy dosing. Pt on Levaquin for HCAP which can increase warfarin sensitivity. Pt to follow up with PCP Dr. Bea Graff on 04/02/15 for close follow up INR.    GIST Relatively recent diagnosis. Pt follows Dr. Anabel Bene in [place]. Tumor thought to be unresectable, now on [place] for life. She was continued on Gleevec 600 mg QD. In addition, she continued to receive Tramadol 50 mg Q6  prn pain.   HTN BP stable.  -cont Hydralazine 10 mg BID  HLD Mild troponin peak on admission. It could possibly due to demand ischemia vs spurious elevation in the setting of ESRD. She was continued on home med ZOCOR 5 mg QHS.   CAD -cont ASA  Discharge Vitals:   BP 146/98 mmHg  Pulse 77  Temp(Src) 98.2 F (36.8 C) (Axillary)  Resp 16  Ht 5\' 2"  (1.575 m)  Wt 138 lb 0.1 oz (62.6 kg)  BMI 25.24 kg/m2  SpO2 97%  Discharge Labs:  No results found for this or any previous visit (from the past 24 hour(s)).  Signed: Shela Leff, MD 03/30/2015, 3:51 PM    Services Ordered on Discharge: None Equipment Ordered on Discharge: None

## 2015-03-25 NOTE — Progress Notes (Addendum)
Subjective:  No sob / noted extra hd today and admit  Team plans dc  Objective Vital signs in last 24 hours: Filed Vitals:   03/24/15 2025 03/24/15 2209 03/25/15 0512 03/25/15 0942  BP: 165/68  142/69   Pulse: 106  100   Temp: 97.5 F (36.4 C)  98 F (36.7 C)   TempSrc: Axillary  Axillary   Resp: 17  17   Height:      Weight:      SpO2: 96% 99% 96% 96%   Weight change:   Physical Exam: General:  NAD.  Heart:  RRR 1/6 sem  No rub  Lungs: Bilat breath sounds slightly decreased in bases Abdomen: soft, nontender Extremities: No  Pedal edema Dialysis Access: RUA AVG + Thrill + Bruit.   Op Dialysis Orders: Center: [facility] on TTS . EDW 65.5 kg HD Bath 2.0 K 2.0 Ca 1 mg Time 3.5 hours Heparin 5000 units per treatment. Access RUA AVG BFR 400 DFR Autoflow 1.5  Mircera 150 mcg IV q 2 weeks ( last dose 03/21/15) Calcitriol 2.25 mcg PO TTS with HD  Problem/Plan: 1.  Dyspnea = [place] sec to Volume overload with mod /severe MR/ mod severer pulm HTn (on 2 dech with 45- 50 ef - CXR/ symptoms mostly resolved with HD uf  sinc e admit( Almost 8 lb removed, below dry wt)/ hd today  For more volume and I dw PT need to Have incr op hd time tx time to avoid large bp drops  in her 89 yr body and card/lung problems ( HAS HOME O2)   per admit team HCAP: being treated empirically on Levaquin. No fevers during night. Suspect vol excess main issue, better after HD. Plan HD again today and we are ok with d/c after HD today. She should go to her usual HD tomorrow as well at the OP center. 2. ESRD - TTS at [facility]. Heparin 5000 units q treatment. Needs longer tx time as op and lower dry wt 3. Hypertension/volume - HD sat.  NET UF 3435 Post wt 63.3 BP controlled. 2 View Chest Xray improved but believe there is still pulmonary vascular congestion present. HD today for uf then return to TTS schedule. 4. Anemia - HGB 12.6  Will follow CBCs. No esa in hosp  5. Metabolic bone disease -  Phos 5/Corrected  Ca ^ 10Continue binders and reduce Calcitiol dose to 2 mcg PO TTS as patient is on Ca Acetate.  6. Nutrition - Renal Diet, Renal vit/nepro ordered. Optimize nutrition. 7. History of PE: Coumadin per pharmacy 8. DM: per admit  Ernest Haber, PA-C [facility] [phone] 03/25/2015,11:07 AM  LOS: 2 days   Pt seen, examined and agree w A/P as above.  Kelly Splinter MD pager [phone]    cell [phone] 03/25/2015, 12:36 PM    Labs: Basic Metabolic Panel:  Recent Labs Lab 03/23/15 1446 03/24/15 0640 03/25/15 0436  NA 134* 135 132*  K 4.8 3.8 4.9  CL 93* 94* 92*  CO2 29 30 26   GLUCOSE 93 88 117*  BUN 24* 9 27*  CREATININE 7.91* 4.48* 6.74*  CALCIUM 9.6 9.4 9.5  PHOS 5.5*  --  5.0*   Liver Function Tests:  Recent Labs Lab 03/23/15 1446 03/25/15 0436  ALBUMIN 3.0* 3.0*   No results for input(s): LIPASE, AMYLASE in the last 168 hours. No results for input(s): AMMONIA in the last 168 hours. CBC:  Recent Labs Lab 03/22/15 1745 03/23/15 1027 03/23/15 1037 03/23/15  1446 03/24/15 0640  WBC 5.1 5.0  --  4.7 5.5  NEUTROABS  --  3.4  --   --   --   HGB 12.2 11.3* 14.3 10.8* 12.6  HCT 37.7 35.5* 42.0 33.5* 39.8  MCV 84.3 85.1  --  83.3 85.2  PLT 190 230  --  246 187   Cardiac Enzymes: No results for input(s): CKTOTAL, CKMB, CKMBINDEX, TROPONINI in the last 168 hours. CBG: No results for input(s): GLUCAP in the last 168 hours.  Studies/Results: Dg Chest 2 View  03/24/2015   CLINICAL DATA:  Healthcare associated pneumonia  EXAM: CHEST  2 VIEW  COMPARISON:  03/23/2015  FINDINGS: Marked enlargement of the cardiomediastinal silhouette is reidentified. Improved right perihilar/ basilar aeration. Small pleural effusions persist. No acute osseous abnormality.  IMPRESSION: Cardiomegaly with improved right basilar aeration.   Electronically Signed   By: Conchita Paris M.D.   On: 03/24/2015 10:10   Dg Chest 2 View  03/23/2015   CLINICAL DATA:  Chronic  chest pain and shortness of breath which worsened yesterday.  EXAM: CHEST  2 VIEW  COMPARISON:  CT chest 03/22/2015 and 02/23/2015. PA and lateral chest 02/23/2015.  FINDINGS: There is marked cardiomegaly. Small right effusion and basilar airspace disease appear unchanged compared to the prior plain films. There is pulmonary vascular congestion without frank edema. No pneumothorax is identified.  IMPRESSION: No change in a small right pleural effusion and basilar airspace disease.  Cardiomegaly and vascular congestion.   Electronically Signed   By: Inge Rise M.D.   On: 03/23/2015 11:41   Medications:   . antiseptic oral rinse  7 mL Mouth Rinse BID  . aspirin EC  81 mg Oral QHS  . budesonide (PULMICORT) nebulizer solution  0.25 mg Nebulization BID  . [START ON 03/26/2015] calcitRIOL  2 mcg Oral Q T,Th,Sa-HD  . calcium acetate  1,334 mg Oral TID WC  . feeding supplement (NEPRO CARB STEADY)  237 mL Oral BID BM  . hydrALAZINE  10 mg Oral BID  . imatinib  100 mg Oral Daily  . levofloxacin (LEVAQUIN) IV  500 mg Intravenous Q48H  . multivitamin  1 tablet Oral QHS  . pantoprazole  40 mg Oral Daily  . simvastatin  5 mg Oral QHS  . Warfarin - Pharmacist Dosing Inpatient   Does not apply [phone]

## 2015-03-25 NOTE — Progress Notes (Signed)
Subjective: Pt seen and examined at bedside. She is doing well today. Breathing improved, lungs sound better. Denies fever, chills, CP, SOB, or cough.   Objective: Vital signs in last 24 hours: Filed Vitals:   03/24/15 1325 03/24/15 2025 03/24/15 2209 03/25/15 0512  BP: 134/68 165/68  142/69  Pulse: 94 106  100  Temp: 98 F (36.7 C) 97.5 F (36.4 C)  98 F (36.7 C)  TempSrc:  Axillary  Axillary  Resp: 16 17  17   Height:      Weight:      SpO2: 94% 96% 99% 96%   Weight change:   Intake/Output Summary (Last 24 hours) at 03/25/15 0715 Last data filed at 03/24/15 1002  Gross per 24 hour  Intake    240 ml  Output      0 ml  Net    240 ml   Physical Exam: General: [place] female, alert, cooperative, NAD. HEENT: PERRL, EOMI. Moist mucus membranes Neck: Full range of motion without pain, supple, no lymphadenopathy or carotid bruits Lungs: Slightly decreased breath sounds in RLL. Scant bibasilar rales. No wheezes or rhonchi.  Heart: RRR, no murmurs, gallops, or rubs Abdomen: Soft, non-tender, non-distended, BS + Extremities: No cyanosis, clubbing, or edema Neurologic: Alert & oriented X3, cranial nerves II-XII intact, strength grossly intact, sensation intact to light touch  Lab Results: Basic Metabolic Panel:  Recent Labs Lab 03/23/15 1446 03/24/15 0640 03/25/15 0436  NA 134* 135 132*  K 4.8 3.8 4.9  CL 93* 94* 92*  CO2 29 30 26   GLUCOSE 93 88 117*  BUN 24* 9 27*  CREATININE 7.91* 4.48* 6.74*  CALCIUM 9.6 9.4 9.5  PHOS 5.5*  --  5.0*   Liver Function Tests:  Recent Labs Lab 03/23/15 1446 03/25/15 0436  ALBUMIN 3.0* 3.0*   CBC:  Recent Labs Lab 03/23/15 1027  03/23/15 1446 03/24/15 0640  WBC 5.0  --  4.7 5.5  NEUTROABS 3.4  --   --   --   HGB 11.3*  < > 10.8* 12.6  HCT 35.5*  < > 33.5* 39.8  MCV 85.1  --  83.3 85.2  PLT 230  --  246 187  < > = values in this interval not displayed.  Coagulation:  Recent Labs Lab 03/23/15 1021  03/24/15 0640 03/25/15 0436  LABPROT 28.8* 31.0* 30.4*  INR 2.77* 3.06* 2.97*   Urine Drug Screen: Drugs of Abuse     Component Value Date/Time   LABOPIA NONE DETECTED 12/25/2014 1517   COCAINSCRNUR NONE DETECTED 12/25/2014 1517   LABBENZ NONE DETECTED 12/25/2014 1517   AMPHETMU NONE DETECTED 12/25/2014 1517   THCU NONE DETECTED 12/25/2014 1517   LABBARB NONE DETECTED 12/25/2014 1517     Micro Results: No results found for this or any previous visit (from the past 240 hour(s)). Studies/Results: Dg Chest 2 View  03/24/2015   CLINICAL DATA:  Healthcare associated pneumonia  EXAM: CHEST  2 VIEW  COMPARISON:  03/23/2015  FINDINGS: Marked enlargement of the cardiomediastinal silhouette is reidentified. Improved right perihilar/ basilar aeration. Small pleural effusions persist. No acute osseous abnormality.  IMPRESSION: Cardiomegaly with improved right basilar aeration.   Electronically Signed   By: Conchita Paris M.D.   On: 03/24/2015 10:10   Dg Chest 2 View  03/23/2015   CLINICAL DATA:  Chronic chest pain and shortness of breath which worsened yesterday.  EXAM: CHEST  2 VIEW  COMPARISON:  CT chest 03/22/2015 and 02/23/2015. PA and lateral chest  02/23/2015.  FINDINGS: There is marked cardiomegaly. Small right effusion and basilar airspace disease appear unchanged compared to the prior plain films. There is pulmonary vascular congestion without frank edema. No pneumothorax is identified.  IMPRESSION: No change in a small right pleural effusion and basilar airspace disease.  Cardiomegaly and vascular congestion.   Electronically Signed   By: Inge Rise M.D.   On: 03/23/2015 11:41   Medications: I have reviewed the patient's current medications. Scheduled Meds: . antiseptic oral rinse  7 mL Mouth Rinse BID  . aspirin EC  81 mg Oral QHS  . budesonide (PULMICORT) nebulizer solution  0.25 mg Nebulization BID  . [START ON 03/26/2015] calcitRIOL  2 mcg Oral Q T,Th,Sa-HD  . calcium acetate   1,334 mg Oral TID WC  . feeding supplement (NEPRO CARB STEADY)  237 mL Oral BID BM  . hydrALAZINE  10 mg Oral BID  . imatinib  100 mg Oral Daily  . levofloxacin (LEVAQUIN) IV  500 mg Intravenous Q48H  . multivitamin  1 tablet Oral QHS  . pantoprazole  40 mg Oral Daily  . simvastatin  5 mg Oral QHS  . Warfarin - Pharmacist Dosing Inpatient   Does not apply q1800   Continuous Infusions:  PRN Meds:.albuterol, docusate sodium, nitroGLYCERIN, traMADol Assessment/Plan: Active Problems:   HCAP (healthcare-associated pneumonia)   Acute bronchitis   ESRD on dialysis   Pulmonary vascular congestion  Ms. ESTHA FEW is a 81 y.o. 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   HCAP Pt admitted for pleuritic CP, SOB, and cough with yellowish sputum. No fever or leukocytosis. CXR suggestive of right lower lobe infiltrate and known right lower lobe pleural effusion. Pt was admitted to hospital in June for a PE. Procalcitonin 0.92. Her symptoms are most likely 2/2 healthcare associated pneumonia. She was started on IV Levaquin upon admission. Breathing improved s/p HD on 7/23. Volume overload could also have contributed toward her pulmonary findings. Pt feels well today and is not complaining of any CP, cough, fever, chills, or SOB. O2 saturation 92-99% on RA. -Pt has already received 3 doses of Levaquin. Switch IV to oral Levaquin 500 mg and continue as outpatient for a full 5 day course.  -albuterol prn  -HD per renal today -Pt doing well, possible discharge to home today after HD    ESRD on HD (TTS) -daily weights, i/o  -cont calcitriol and calcium acetate  -cont Rena-vit  -appreciate Neophrology recs  Chronic Anemia  Hgb 10.8 in the setting of ESRD.  -F/u CBC  COPD -cont Proventil and Pulmicort   Hx of PE  INR is 2.77.  -Coumadin per pharmacy dosing    GIST Relatively recent diagnosis. Pt follows Dr. Anabel Bene in [place]. Tumor thought to be unresectable, now on Gleevec for life.  -cont Gleevec 600 mg QD -cont Tramadol 50 mg Q6 prn   HTN Stable.  -cont Hydralazine 10 mg BID  HLD Mild troponin peak on admission. It could possibly due to demand ischemia vs spurious elevation in the setting of ESRD.  -cont ZOCOR 5 mg QHS  CAD -cont ASA  Diet: renal   GI ppx: protonix  DVT ppx: coumadin     Dispo: Disposition is deferred at this time, awaiting improvement of current medical problems.  Anticipated discharge in approximately 0 day(s).   The patient does have a current  PCP (Raina Mina, MD) and does need an [facility] hospital follow-up appointment after discharge.  The patient does not have transportation limitations that hinder transportation to clinic appointments.  .Services Needed at time of discharge: Y = Yes, Blank = No PT:   OT:   RN:   Equipment:   Other:     LOS: 2 days   Shela Leff, MD 03/25/2015, 7:15 AM

## 2015-03-25 NOTE — Progress Notes (Signed)
ANTICOAGULATION CONSULT NOTE - Follow Up Consult  Pharmacy Consult for Warfarin Indication: Hx recent PE (June 2016)  No Known Allergies  Patient Measurements: Height: 5\' 2"  (157.5 cm) Weight: 139 lb 8.8 oz (63.3 kg) IBW/kg (Calculated) : 50.1  Vital Signs: Temp: 98 F (36.7 C) (07/25 0512) Temp Source: Axillary (07/25 0512) BP: 142/69 mmHg (07/25 0512) Pulse Rate: 100 (07/25 0512)  Labs:  Recent Labs  03/23/15 1021 03/23/15 1027 03/23/15 1037 03/23/15 1446 03/24/15 0640 03/25/15 0436  HGB  --  11.3* 14.3 10.8* 12.6  --   HCT  --  35.5* 42.0 33.5* 39.8  --   PLT  --  230  --  246 187  --   LABPROT 28.8*  --   --   --  31.0* 30.4*  INR 2.77*  --   --   --  3.06* 2.97*  CREATININE  --   --  7.30* 7.91* 4.48* 6.74*    Estimated Creatinine Clearance: 5.3 mL/min (by C-G formula based on Cr of 6.74).   Assessment: Annette Roach on warfarin PTA for hx recent PE in June '16 who presented today with SOB concerning for PNA. The patient's INR was therapeutic on admission (INR 2.77) on PTA dose of 7.5 mg daily. Last dose PTA was on 7/22. Pharmacy has been consulted to resume warfarin dosing this admission.  INR is therapeutic (2.97).  No further increase after Coumadin dose decreased to 1 mg yesterday. Day #3 Levaquin for HCAP coverage, which has increased warfarin sensitivity. May go home after hemodialysis today, but may be here at 6pm when Coumadin dose is due.  Goal of Therapy:  INR 2-3   Plan:    Warfarin 1 mg again today at 6pm.   Daily PT/INR.    Planning 1 more dose of Levaquin on 7/27.   Would continue with reduced dose of Coumadin while on Levaquin.       Has 5 mg tablets at home. May only need 2.5 mg for the next few days.  Kelvin Cellar, RPh Pager: [phone] 03/25/2015 1:47 PM

## 2015-03-29 ENCOUNTER — Other Ambulatory Visit: Payer: Self-pay | Admitting: Internal Medicine

## 2015-05-08 ENCOUNTER — Other Ambulatory Visit: Payer: Self-pay | Admitting: Student in an Organized Health Care Education/Training Program

## 2015-07-11 ENCOUNTER — Observation Stay (HOSPITAL_COMMUNITY)
Admission: EM | Admit: 2015-07-11 | Discharge: 2015-07-12 | Disposition: A | Discharge status: Home / Self Care | Payer: Medicare Other | Attending: Internal Medicine | Admitting: Internal Medicine

## 2015-07-11 ENCOUNTER — Encounter (HOSPITAL_COMMUNITY): Payer: Self-pay | Admitting: Emergency Medicine

## 2015-07-11 ENCOUNTER — Emergency Department (HOSPITAL_COMMUNITY): Payer: Medicare Other

## 2015-07-11 DIAGNOSIS — R072 Precordial pain: Secondary | ICD-10-CM

## 2015-07-11 DIAGNOSIS — K08409 Partial loss of teeth, unspecified cause, unspecified class: Secondary | ICD-10-CM

## 2015-07-11 DIAGNOSIS — N289 Disorder of kidney and ureter, unspecified: Secondary | ICD-10-CM | POA: Insufficient documentation

## 2015-07-11 DIAGNOSIS — I25119 Atherosclerotic heart disease of native coronary artery with unspecified angina pectoris: Secondary | ICD-10-CM | POA: Insufficient documentation

## 2015-07-11 DIAGNOSIS — K5900 Constipation, unspecified: Secondary | ICD-10-CM | POA: Insufficient documentation

## 2015-07-11 DIAGNOSIS — G629 Polyneuropathy, unspecified: Secondary | ICD-10-CM | POA: Insufficient documentation

## 2015-07-11 DIAGNOSIS — Z86711 Personal history of pulmonary embolism: Secondary | ICD-10-CM

## 2015-07-11 DIAGNOSIS — C169 Malignant neoplasm of stomach, unspecified: Secondary | ICD-10-CM | POA: Diagnosis present

## 2015-07-11 DIAGNOSIS — K210 Gastro-esophageal reflux disease with esophagitis: Secondary | ICD-10-CM | POA: Diagnosis not present

## 2015-07-11 DIAGNOSIS — J441 Chronic obstructive pulmonary disease with (acute) exacerbation: Secondary | ICD-10-CM | POA: Insufficient documentation

## 2015-07-11 DIAGNOSIS — Z87442 Personal history of urinary calculi: Secondary | ICD-10-CM | POA: Diagnosis not present

## 2015-07-11 DIAGNOSIS — E785 Hyperlipidemia, unspecified: Secondary | ICD-10-CM | POA: Diagnosis not present

## 2015-07-11 DIAGNOSIS — R9431 Abnormal electrocardiogram [ECG] [EKG]: Secondary | ICD-10-CM | POA: Diagnosis present

## 2015-07-11 DIAGNOSIS — E213 Hyperparathyroidism, unspecified: Secondary | ICD-10-CM | POA: Diagnosis not present

## 2015-07-11 DIAGNOSIS — F329 Major depressive disorder, single episode, unspecified: Secondary | ICD-10-CM | POA: Diagnosis not present

## 2015-07-11 DIAGNOSIS — D649 Anemia, unspecified: Secondary | ICD-10-CM | POA: Insufficient documentation

## 2015-07-11 DIAGNOSIS — I10 Essential (primary) hypertension: Secondary | ICD-10-CM | POA: Diagnosis present

## 2015-07-11 DIAGNOSIS — I4581 Long QT syndrome: Secondary | ICD-10-CM

## 2015-07-11 DIAGNOSIS — Z992 Dependence on renal dialysis: Secondary | ICD-10-CM | POA: Diagnosis not present

## 2015-07-11 DIAGNOSIS — Z7901 Long term (current) use of anticoagulants: Secondary | ICD-10-CM | POA: Insufficient documentation

## 2015-07-11 DIAGNOSIS — R079 Chest pain, unspecified: Principal | ICD-10-CM | POA: Insufficient documentation

## 2015-07-11 DIAGNOSIS — I5042 Chronic combined systolic (congestive) and diastolic (congestive) heart failure: Secondary | ICD-10-CM

## 2015-07-11 DIAGNOSIS — N186 End stage renal disease: Secondary | ICD-10-CM | POA: Diagnosis not present

## 2015-07-11 DIAGNOSIS — E119 Type 2 diabetes mellitus without complications: Secondary | ICD-10-CM | POA: Diagnosis not present

## 2015-07-11 DIAGNOSIS — Z8701 Personal history of pneumonia (recurrent): Secondary | ICD-10-CM | POA: Diagnosis not present

## 2015-07-11 DIAGNOSIS — I2510 Atherosclerotic heart disease of native coronary artery without angina pectoris: Secondary | ICD-10-CM | POA: Diagnosis present

## 2015-07-11 DIAGNOSIS — Z973 Presence of spectacles and contact lenses: Secondary | ICD-10-CM | POA: Diagnosis not present

## 2015-07-11 DIAGNOSIS — M1990 Unspecified osteoarthritis, unspecified site: Secondary | ICD-10-CM | POA: Insufficient documentation

## 2015-07-11 DIAGNOSIS — Z792 Long term (current) use of antibiotics: Secondary | ICD-10-CM | POA: Insufficient documentation

## 2015-07-11 DIAGNOSIS — K219 Gastro-esophageal reflux disease without esophagitis: Secondary | ICD-10-CM | POA: Diagnosis present

## 2015-07-11 LAB — BASIC METABOLIC PANEL
Anion gap: 15 (ref 5–15)
BUN: 39 mg/dL — AB (ref 6–20)
CALCIUM: 7.8 mg/dL — AB (ref 8.9–10.3)
CHLORIDE: 97 mmol/L — AB (ref 101–111)
CO2: 25 mmol/L (ref 22–32)
CREATININE: 7.46 mg/dL — AB (ref 0.44–1.00)
GFR calc Af Amer: 5 mL/min — ABNORMAL LOW (ref >60)
GFR calc non Af Amer: 4 mL/min — ABNORMAL LOW (ref >60)
GLUCOSE: 102 mg/dL — AB (ref 65–99)
Potassium: 4.2 mmol/L (ref 3.5–5.1)
Sodium: 137 mmol/L (ref 135–145)

## 2015-07-11 LAB — I-STAT TROPONIN, ED: Troponin i, poc: 0.08 ng/mL (ref 0.00–0.08)

## 2015-07-11 LAB — CBC
HCT: 37.5 % (ref 36.0–46.0)
Hemoglobin: 12.3 g/dL (ref 12.0–15.0)
MCH: 28.9 pg (ref 26.0–34.0)
MCHC: 32.8 g/dL (ref 30.0–36.0)
MCV: 88 fL (ref 78.0–100.0)
PLATELETS: 218 10*3/uL (ref 150–400)
RBC: 4.26 MIL/uL (ref 3.87–5.11)
RDW: 15.8 % — AB (ref 11.5–15.5)
WBC: 7.2 10*3/uL (ref 4.0–10.5)

## 2015-07-11 LAB — GLUCOSE, CAPILLARY
GLUCOSE-CAPILLARY: 166 mg/dL — AB (ref 65–99)
Glucose-Capillary: 93 mg/dL (ref 65–99)

## 2015-07-11 LAB — PROTIME-INR
INR: 2.86 — ABNORMAL HIGH (ref 0.00–1.49)
PROTHROMBIN TIME: 29.6 s — AB (ref 11.6–15.2)

## 2015-07-11 LAB — TROPONIN I
Troponin I: 0.07 ng/mL — ABNORMAL HIGH (ref <0.031)
Troponin I: 0.07 ng/mL — ABNORMAL HIGH (ref <0.031)

## 2015-07-11 MED ORDER — RENA-VITE PO TABS
1.0000 | ORAL_TABLET | Freq: Every day | ORAL | Status: DC
  Administered 2015-07-11 – 2015-07-12 (×2): 1 via ORAL
  Filled 2015-07-11 (×2): qty 1

## 2015-07-11 MED ORDER — CALCIUM ACETATE 667 MG PO CAPS
667.0000 mg | ORAL_CAPSULE | Freq: Three times a day (TID) | ORAL | Status: DC
  Administered 2015-07-11 – 2015-07-12 (×2): 667 mg via ORAL
  Filled 2015-07-11 (×5): qty 2
  Filled 2015-07-11 (×2): qty 1

## 2015-07-11 MED ORDER — BUDESONIDE 0.25 MG/2ML IN SUSP
0.2500 mg | Freq: Two times a day (BID) | RESPIRATORY_TRACT | Status: DC
  Administered 2015-07-11: 0.25 mg via RESPIRATORY_TRACT
  Filled 2015-07-11 (×2): qty 2

## 2015-07-11 MED ORDER — ACETAMINOPHEN 325 MG PO TABS: 650.0000 mg | ORAL_TABLET | ORAL | Status: DC | PRN

## 2015-07-11 MED ORDER — ALBUTEROL SULFATE (2.5 MG/3ML) 0.083% IN NEBU: 2.5000 mg | INHALATION_SOLUTION | RESPIRATORY_TRACT | Status: DC | PRN

## 2015-07-11 MED ORDER — GI COCKTAIL ~~LOC~~: 30.0000 mL | Freq: Four times a day (QID) | ORAL | Status: DC | PRN

## 2015-07-11 MED ORDER — PENICILLIN V POTASSIUM 500 MG PO TABS
500.0000 mg | ORAL_TABLET | Freq: Four times a day (QID) | ORAL | Status: DC
  Administered 2015-07-11 – 2015-07-12 (×3): 500 mg via ORAL
  Filled 2015-07-11 (×8): qty 1

## 2015-07-11 MED ORDER — POLYETHYLENE GLYCOL 3350 17 G PO PACK
17.0000 g | PACK | Freq: Every day | ORAL | Status: DC
  Administered 2015-07-12: 17 g via ORAL
  Filled 2015-07-11: qty 1

## 2015-07-11 MED ORDER — PANTOPRAZOLE SODIUM 40 MG PO TBEC
40.0000 mg | DELAYED_RELEASE_TABLET | Freq: Every day | ORAL | Status: DC
  Administered 2015-07-11 – 2015-07-12 (×2): 40 mg via ORAL
  Filled 2015-07-11 (×2): qty 1

## 2015-07-11 MED ORDER — BENZONATATE 100 MG PO CAPS: 100.0000 mg | ORAL_CAPSULE | Freq: Two times a day (BID) | ORAL | Status: DC | PRN

## 2015-07-11 MED ORDER — OXYCODONE HCL 5 MG PO TABS
5.0000 mg | ORAL_TABLET | Freq: Four times a day (QID) | ORAL | Status: DC | PRN
  Administered 2015-07-11: 5 mg via ORAL
  Filled 2015-07-11: qty 1

## 2015-07-11 MED ORDER — PROCHLORPERAZINE MALEATE 10 MG PO TABS
10.0000 mg | ORAL_TABLET | Freq: Four times a day (QID) | ORAL | Status: DC | PRN
  Filled 2015-07-11: qty 1

## 2015-07-11 MED ORDER — IMATINIB MESYLATE 100 MG PO TABS
200.0000 mg | ORAL_TABLET | ORAL | Status: DC
  Filled 2015-07-11: qty 2

## 2015-07-11 MED ORDER — SENNA-DOCUSATE SODIUM 8.6-50 MG PO TABS
1.0000 | ORAL_TABLET | Freq: Two times a day (BID) | ORAL | Status: DC | PRN
  Filled 2015-07-11: qty 1

## 2015-07-11 MED ORDER — IPRATROPIUM-ALBUTEROL 0.5-2.5 (3) MG/3ML IN SOLN: 3.0000 mL | Freq: Four times a day (QID) | RESPIRATORY_TRACT | Status: DC | PRN

## 2015-07-11 MED ORDER — HYDRALAZINE HCL 10 MG PO TABS
10.0000 mg | ORAL_TABLET | Freq: Two times a day (BID) | ORAL | Status: DC
  Administered 2015-07-11 – 2015-07-12 (×2): 10 mg via ORAL
  Filled 2015-07-11 (×2): qty 1

## 2015-07-11 MED ORDER — AZELASTINE HCL 0.1 % NA SOLN
1.0000 | Freq: Every day | NASAL | Status: DC
  Filled 2015-07-11: qty 30

## 2015-07-11 MED ORDER — METOPROLOL SUCCINATE ER 25 MG PO TB24
25.0000 mg | ORAL_TABLET | Freq: Every day | ORAL | Status: DC
  Administered 2015-07-11 – 2015-07-12 (×2): 25 mg via ORAL
  Filled 2015-07-11 (×2): qty 1

## 2015-07-11 MED ORDER — MIRTAZAPINE 7.5 MG PO TABS
15.0000 mg | ORAL_TABLET | Freq: Every day | ORAL | Status: DC
  Administered 2015-07-11: 15 mg via ORAL
  Filled 2015-07-11: qty 2

## 2015-07-11 MED ORDER — ALBUTEROL SULFATE HFA 108 (90 BASE) MCG/ACT IN AERS: 2.0000 | INHALATION_SPRAY | RESPIRATORY_TRACT | Status: DC | PRN

## 2015-07-11 MED ORDER — BECLOMETHASONE DIPROPIONATE 80 MCG/ACT IN AERS: 2.0000 | INHALATION_SPRAY | Freq: Two times a day (BID) | RESPIRATORY_TRACT | Status: DC | PRN

## 2015-07-11 MED ORDER — CALCITRIOL 0.25 MCG PO CAPS: 2.2500 ug | ORAL_CAPSULE | ORAL | Status: DC

## 2015-07-11 MED ORDER — NITROGLYCERIN 0.4 MG SL SUBL
0.4000 mg | SUBLINGUAL_TABLET | SUBLINGUAL | Status: DC | PRN
  Administered 2015-07-11 (×3): 0.4 mg via SUBLINGUAL
  Filled 2015-07-11: qty 1

## 2015-07-11 MED ORDER — CINACALCET HCL 30 MG PO TABS
30.0000 mg | ORAL_TABLET | Freq: Every day | ORAL | Status: DC
  Administered 2015-07-11 – 2015-07-12 (×2): 30 mg via ORAL
  Filled 2015-07-11 (×2): qty 1

## 2015-07-11 NOTE — H&P (Signed)
[facility] History and Physical  TORRANCE DECORDOVA [id number] DOB: [birth year] DOA: 07/11/2015  Referring physician: Emergency Department PCP: Gilford Rile, MD   CHIEF COMPLAINT:  Chest pain  HPI: Annette Roach is a 81 y.o. female with multiple medical problems not limited to ESRD on HD Tues, Thurs and Sat, HTN, CAD, and asthma who presented to ED this am with chest pain. Mid sternal pain started around 6am. It didn't radiate into jaws, back or down extremities. She was diaphoretic but no nausea. No SOB or cough.  Patient has had similar pain before but "not this bad". Pain improved with SL Nitro. Currently she has no chest pain. No other medical complaints.   ED COURSE:     Labs:   Troponin normal at 0.08, hgb 12.3. WBC 7.2  CXR:   No acute findings  EKG:    Sinus rhythm Atrial premature complex Prolonged PR interval Probable left atrial enlargement IVCD, consider atypical RBBB LVH with IVCD, LAD and secondary repol abnrm Inferior infarct, age indeterminate Minimal ST elevation, anterolateral leads Prolonged QT interval T wave inversions now present in inferolateral leads  Review of Systems  Constitutional: Positive for diaphoresis.  HENT: Negative.   Eyes: Negative.   Respiratory: Negative.   Cardiovascular: Positive for chest pain.  Gastrointestinal: Negative.   Genitourinary: Negative.   Musculoskeletal: Negative.   Skin: Negative.   Neurological: Negative.   Endo/Heme/Allergies: Negative.   Psychiatric/Behavioral: Negative.     Past Medical History  Diagnosis Date  . COPD (chronic obstructive pulmonary disease) (HCC)     emphysema  . Neuropathy ([place])   . Hyperlipidemia   . Diabetes mellitus   . Depression   . Anemia   . Shortness of breath   . GERD (gastroesophageal reflux disease)   . Hyperparathyroidism ([place])   . Hypertension     sees Dr. Gilford Rile  . Headache(784.0)   . Arthritis   . CAD (coronary artery disease)     sees Dr. Jenne Campus, [facility] cardiology [place], [place]  . Steal syndrome of hand ([place]) jan- 2014    left hand  . Wears glasses   . Wears hearing aid     right  . Presence of surgically created AV shunt for hemodialysis [facility])     old lt upper arm out-rt upper arm shunt in  . Anginal pain ([place])     Dr Raliegh Ip in [place], [place]  . History of kidney stones   . Constipation   . History of blood transfusion   . Asthma     years ago  . Chronic kidney disease     [facility] [place], [place]- Hemo  . Renal insufficiency     patient on dialysis for 3 years (today is 03/22/15  . Pneumonia 03/2015   Past Surgical History  Procedure Laterality Date  . Abdominal hysterectomy  1976  . Kidney stone surgery  2013  . Av fistula placement  01/20/2012    Procedure: ARTERIOVENOUS (AV) FISTULA CREATION;  Surgeon: Elam Dutch, MD;  Location: [place];  Service: Vascular;  Laterality: Left;  . Hemodialysis catheter  05/25/12    secondary to failed AVF   . Eye surgery      bilateral cataract removed  . Av fistula placement  09/05/2012    Procedure: INSERTION OF ARTERIOVENOUS (AV) GORE-TEX GRAFT ARM;  Surgeon: Elam Dutch, MD;  Location: [facility] OR;  Service: Vascular;  Laterality: Left;  Using 4-65mm x 45 cm Vascular stretch goretex graft  .  Ligation of arteriovenous  fistula  09/05/2012    Procedure: LIGATION OF ARTERIOVENOUS  FISTULA;  Surgeon: Elam Dutch, MD;  Location: [place];  Service: Vascular;  Laterality: Left;  . Ligation arteriovenous gortex graft  09/05/2012    Procedure: LIGATION ARTERIOVENOUS GORTEX GRAFT;  Surgeon: Elam Dutch, MD;  Location: [place];  Service: Vascular;  Laterality: Left;  . Av fistula placement Right 11/07/2012    Procedure: ARTERIOVENOUS (AV) FISTULA CREATION;  Surgeon: Elam Dutch, MD;  Location[facility] OR;  Service: Vascular;  Laterality: Right;  Bascilic Vein Fistula  . Appendectomy    . Carpal tunnel release Left 02/10/2013    Procedure: CARPAL TUNNEL RELEASE;  Surgeon:  Cammie Sickle., MD;  Location: [place];  Service: Orthopedics;  Laterality: Left;  . Bascilic vein transposition Right 03/15/2013    Procedure: 2ND STAGE BASCILIC VEIN TRANSPOSITION - RIGHT ARM;  Surgeon: Elam Dutch, MD;  Location: [place];  Service: Vascular;  Laterality: Right;  . Av fistula placement Right 05/15/2013    Procedure: INSERTION OF ARTERIOVENOUS (AV) GORE-TEX GRAFT ARM-RIGHT;  Surgeon: Elam Dutch, MD;  Location: [place];  Service: Vascular;  Laterality: Right;  . Shuntogram N/A 07/27/2012    Procedure: Earney Mallet;  Surgeon: Rosetta Posner, MD;  Location: [facility] C[facility] LAB;  Service: Cardiovascular;  Laterality: N/A;    SOCIAL HISTORY:  reports that she has never smoked. She has never used smokeless tobacco. She reports that she does not drink alcohol or use illicit drugs.   No Known Allergies  Family History  Problem Relation Age of Onset  . Diabetes Mother   . Cancer Father   . Deep vein thrombosis Brother   . Hypertension Brother     Prior to Admission medications   Medication Sig Start Date End Date Taking? Authorizing Provider  Azelastine HCl 0.15 % SOLN Place 1 spray into both nostrils daily.  10/10/14  Yes Historical Provider, MD  beclomethasone (QVAR) 80 MCG/ACT inhaler Inhale 2 puffs into the lungs 2 (two) times daily as needed (shortness of breath).    Yes Historical Provider, MD  benzonatate (TESSALON) 100 MG capsule Take 100 mg by mouth 2 (two) times daily as needed for cough.   Yes Historical Provider, MD  calcium acetate (PHOSLO) 667 MG capsule Take 667-1,334 mg by mouth 3 (three) times daily with meals. 1 capsule (667mg ) with breakfast and lunch, 2 capsules (1334mg ) with dinner   Yes Historical Provider, MD  cinacalcet (SENSIPAR) 30 MG tablet Take 30 mg by mouth daily.   Yes Historical Provider, MD  hydrALAZINE (APRESOLINE) 10 MG tablet Take 10 mg by mouth 2 (two) times daily.   Yes Historical Provider, MD  imatinib (GLEEVEC) 100 MG tablet  Take 200 mg by mouth daily. Take at 3:30pm daily 02/04/15  Yes Historical Provider, MD  ipratropium-albuterol (DUONEB) 0.5-2.5 (3) MG/3ML SOLN Take 3 mLs by nebulization every 6 (six) hours as needed (shortness of breath).   Yes Historical Provider, MD  metoprolol succinate (TOPROL-XL) 25 MG 24 hr tablet Take 25 mg by mouth daily.   Yes Historical Provider, MD  mirtazapine (REMERON) 15 MG tablet Take 15 mg by mouth at bedtime.   Yes Historical Provider, MD  multivitamin (RENA-VIT) TABS tablet Take 1 tablet by mouth daily.   Yes Historical Provider, MD  nitroGLYCERIN (NITROSTAT) 0.4 MG SL tablet Place 0.4 mg under the tongue every 5 (five) minutes as needed for chest pain.   Yes Historical Provider,  MD  ondansetron (ZOFRAN) 4 MG tablet Take 4 mg by mouth every 8 (eight) hours as needed for nausea or vomiting.   Yes Historical Provider, MD  oxyCODONE (OXY IR/ROXICODONE) 5 MG immediate release tablet Take 5 mg by mouth every 6 (six) hours as needed for severe pain.   Yes Historical Provider, MD  pantoprazole (PROTONIX) 40 MG tablet Take 40 mg by mouth daily.   Yes Historical Provider, MD  penicillin v potassium (VEETID) 500 MG tablet Take 500 mg by mouth 4 (four) times daily. For ten days.  Started 07/08/15   Yes Historical Provider, MD  polyethylene glycol (MIRALAX / GLYCOLAX) packet Take 17 g by mouth daily.   Yes Historical Provider, MD  prochlorperazine (COMPAZINE) 10 MG tablet TAKE 1 TABLET EVERY 6 HOURS AS NEEDED FOR CHEMO NAUSEA-MAY TAKE AROUND THE CLOCK FOR PERSISTENT SYMP 01/30/15  Yes Historical Provider, MD  sennosides-docusate sodium (SENOKOT-S) 8.6-50 MG tablet Take 1 tablet by mouth 2 (two) times daily as needed for constipation.   Yes Historical Provider, MD  VENTOLIN HFA 108 (90 BASE) MCG/ACT inhaler Inhale 2 puffs into the lungs every 4 (four) hours as needed for wheezing or shortness of breath.  10/12/14  Yes Historical Provider, MD  warfarin (COUMADIN) 5 MG tablet TAKE 1.5 TABLETS (7.5 MG  TOTAL) BY MOUTH ONE TIME ONLY AT 6 PM. Patient taking differently: take 1 tablet by mouth once daily 04/01/15  Yes Axel Filler, MD  Nutritional Supplements (FEEDING SUPPLEMENT, NEPRO CARB STEADY,) LIQD Take 237 mLs by mouth as needed (missed meal during dialysis.). Patient not taking: Reported on 07/11/2015 11/20/14   Jerald Kief A Regalado, MD   PHYSICAL EXAM: Filed Vitals:   07/11/15 0930 07/11/15 0945 07/11/15 1015 07/11/15 1030  BP: 124/63 118/85 112/52 118/49  Pulse: 81 80 82 81  Temp:      TempSrc:      Resp: 17 19 16 18   Height:      Weight:      SpO2: 100% 99% 99% 97%    Wt Readings from Last 3 Encounters:  07/11/15 72.576 kg (160 lb)  03/25/15 62.6 kg (138 lb 0.1 oz)  03/22/15 65.772 kg (145 lb)    General:  Pleasant black female. Appears calm and comfortable Eyes: PER, normal lids, irises & conjunctiva ENT: grossly normal hearing, lips & tongue Neck: no LAD, no masses Cardiovascular: RRR, No LE edema.  Catheter right chest Respiratory: Respirations even and unlabored. Normal respiratory effort. Lungs CTA bilaterally, no wheezes / rales .   Abdomen: soft, non-distended, non-tender, active bowel sounds. No obvious masses.  Skin: no rash seen on limited exam Musculoskeletal: grossly normal tone BUE/BLE Psychiatric: grossly normal mood and affect, speech fluent and appropriate Neurologic: grossly non-focal.         LABS ON ADMISSION:    Basic Metabolic Panel:  Recent Labs Lab 07/11/15 0940  NA 137  K 4.2  CL 97*  CO2 25  GLUCOSE 102*  BUN 39*  CREATININE 7.46*  CALCIUM 7.8*   CBC:  Recent Labs Lab 07/11/15 0940  WBC 7.2  HGB 12.3  HCT 37.5  MCV 88.0  PLT 218    BNP (last 3 results)  Recent Labs  02/23/15 1500  BNP 1623.7*   Radiological Exams on Admission: Dg Chest 2 View  07/11/2015  CLINICAL DATA:  Chest pain and cough for 2 days EXAM: CHEST - 2 VIEW COMPARISON:  03/24/2015 FINDINGS: A right-sided dialysis catheter is now noted in  place. The  cardiac shadow remains mildly enlarged. The lungs are well aerated bilaterally. No focal confluent infiltrate is noted. Some residual thickening of the minor fissure is noted on the right. No bony abnormality is seen. IMPRESSION: No acute abnormality noted. Electronically Signed   By: Inez Catalina M.D.   On: 07/11/2015 10:06    ASSESSMENT / PLAN     Atypical chest pain with Twave inversion and prolonged QT interval on EKG this am. Chest pain improved with SL Nitroglycerin. POC troponin normal. Cardiology has evaluated. Patient has multiple medical problems, she would like to avoid invasive procedures if possible.  -admit to observation - Telemetry bed -cycle troponins -Repeat am EKG - hold coumadin in case patient proceeds with cardiac procedure    History of pulmonary embolism diagnosed June 2016, on coumadin. INR 2.86 today. Resolution of PE on July 2016 chest CTA.  -hold coumadin per Cardiology (in case cardiac cath warranted and patient agrees).   ESRD on HD Tues, Thurs, and Sat. No evidence for volume overload on  CXR. Nephrology has evaluated and plan is for HD today.   Chronic combined systolic and diastolic heart failure. Echo 6/26 revealedLV EF 40-45% with inferior akinesis and MR- which could reflect prior infarct.   Prolonged QT (540) on EKG. -Avoid QT prolonged medications.    COPD / asthma. Stable. Continue home inhalers  Hypertension. Controlled. Continue home anti-hypertensive medications. Start today.   GIST, unresected.  Patient on Gleevac, followed by Hematologist  Dr. Anabel Bene in [place], [place]. No details available -Continue home Gleevac  Status post tooth extraction. On Pencillin, started Monday.  -Continue for Penicillin for total of 10 days.   GERD. Stable.  -Continue home PPI  CONSULTANTS:   Cardiology - recommends admission / troponin cycling Nephrology - will dialyze patient today   Code Status: DNR DVT Prophylaxis: Currently  anti-coagulated with coumadin Family Communication:   Patient alert, oriented and understands plan of care. Daughter at bedside and also understands plan of treatment.   Disposition Plan: Discharge to home in 24-48 hours   Time spent: 60 minutes Tye Savoy  NP [facility] Pager [phone]

## 2015-07-11 NOTE — ED Notes (Signed)
IV team at bedside 

## 2015-07-11 NOTE — ED Notes (Signed)
Onset today woke up midsternum chest pain 0700 took one nitro with relief EMS called administered aspirin 324 mg PO. Upon arrival chest pain improved 2/10 sharp pain.

## 2015-07-11 NOTE — Consult Note (Signed)
CARDIOLOGY CONSULT NOTE   Patient ID: Annette Roach MRN: [id number] DOB/AGE: Jul 05, 1928 81 y.o.  Admit date: 07/11/2015  Primary Physician   Annette Rile, MD Primary Cardiologist   Annette Roach in [place] Reason for Consultation   Chest pain  HPI: Annette Roach is a 81 y.o. female with a history of CAD, Chronic combined systolic/diastolic CHF EF [id number], COPD, DM II, HTN, HLD, RBBB, ESRD on HD (T, Th, Sat), GIST (GI stromal tumor), asthma, anemia, hyperparathyroidism, pulmonary embolism presented with chest pain starting this morning around 6:30 AM  The patient was admitted for chest pain 6/25-6/27  and found to have acute PE. At that time echo showed reduced EF with inferior akinesis and MR which could reflect prior infarct. Seen by cardiology and advised outpatient elective stress test with regular cardiologist. Seen by Palliative care at time and plan for outpatient hospice service, stop dialysis (patient declined) and warfarin. In the past patient was somewhat confused on higher dose of oxycodone. The patient has declined cardiac catheterization in past. The patient was recently seen by primary cardiologist and placed on Holter monitor for 24 hours x 2 - Unknown result.  Admits compliant with medication and dialysis. She woke up with substernal chest pain this morning that she described as a sharp. Somewhat worse with deep breath. Denies radiation of pain or shortness of breath. Usually the pain was 10 out of 10 that improved on sublingual nitroglycerin. Currently has mild chest pain. The patient denies nausea, vomiting, fever,palpitations, shortness of breath, orthopnea, PND, dizziness, syncope, congestion, abdominal pain, hematochezia, melena, lower extremity edema.    Past Medical History  Diagnosis Date  . COPD (chronic obstructive pulmonary disease) (HCC)     emphysema  . Neuropathy ([place])   . Hyperlipidemia   . Diabetes mellitus   . Depression   . Anemia   . Shortness of  breath   . GERD (gastroesophageal reflux disease)   . Hyperparathyroidism ([place])   . Hypertension     sees Annette Roach  . Headache(784.0)   . Arthritis   . CAD (coronary artery disease)     sees Annette Roach, [facility] cardiology [facility], Annette Roach  . Steal syndrome of hand ([place]) jan- 2014    left hand  . Wears glasses   . Wears hearing aid     right  . Presence of surgically created AV shunt for hemodialysis [facility])     old lt upper arm out-rt upper arm shunt in  . Anginal pain ([place])     Annette Roach in [place], [place]  . History of kidney stones   . Constipation   . History of blood transfusion   . Asthma     years ago  . Chronic kidney disease     TTH SAT [place], [place]- Hemo  . Renal insufficiency     patient on dialysis for 3 years (today is 03/22/15  . Pneumonia 03/2015     Past Surgical History  Procedure Laterality Date  . Abdominal hysterectomy  1976  . Kidney stone surgery  2013  . Av fistula placement  01/20/2012    Procedure: ARTERIOVENOUS (AV) FISTULA CREATION;  Surgeon: Elam Dutch, MD;  Location: [place];  Service: Vascular;  Laterality: Left;  . Hemodialysis catheter  05/25/12    secondary to failed AVF   . Eye surgery      bilateral cataract removed  . Av fistula placement  09/05/2012    Procedure: INSERTION OF ARTERIOVENOUS (AV) GORE-TEX  GRAFT ARM;  Surgeon: Elam Dutch, MD;  Location: [facility] OR;  Service: Vascular;  Laterality: Left;  Using 4-32mm x 45 cm Vascular stretch goretex graft  . Ligation of arteriovenous  fistula  09/05/2012    Procedure: LIGATION OF ARTERIOVENOUS  FISTULA;  Surgeon: Elam Dutch, MD;  Location: [place];  Service: Vascular;  Laterality: Left;  . Ligation arteriovenous gortex graft  09/05/2012    Procedure: LIGATION ARTERIOVENOUS GORTEX GRAFT;  Surgeon: Elam Dutch, MD;  Location: [place];  Service: Vascular;  Laterality: Left;  . Av fistula placement Right 11/07/2012    Procedure: ARTERIOVENOUS (AV) FISTULA CREATION;   Surgeon: Elam Dutch, MD;  Location: [facility] OR;  Service: Vascular;  Laterality: Right;  Bascilic Vein Fistula  . Appendectomy    . Carpal tunnel release Left 02/10/2013    Procedure: CARPAL TUNNEL RELEASE;  Surgeon: Cammie Sickle., MD;  Location: [place];  Service: Orthopedics;  Laterality: Left;  . Bascilic vein transposition Right 03/15/2013    Procedure: 2ND STAGE BASCILIC VEIN TRANSPOSITION - RIGHT ARM;  Surgeon: Elam Dutch, MD;  Location: [place];  Service: Vascular;  Laterality: Right;  . Av fistula placement Right 05/15/2013    Procedure: INSERTION OF ARTERIOVENOUS (AV) GORE-TEX GRAFT ARM-RIGHT;  Surgeon: Elam Dutch, MD;  Location: [place];  Service: Vascular;  Laterality: Right;  . Shuntogram N/A 07/27/2012    Procedure: Earney Mallet;  Surgeon: Rosetta Posner, MD;  Location: [facility] CATH LAB;  Service: Cardiovascular;  Laterality: N/A;    No Known Allergies  I have reviewed the patient's current medications     Prior to Admission medications   Medication Sig Start Date End Date Taking? Authorizing Provider  albuterol (PROVENTIL) (2.5 MG/3ML) 0.083% nebulizer solution Take 2.5 mg by nebulization every 6 (six) hours as needed for wheezing or shortness of breath.    Historical Provider, MD  Azelastine HCl 0.15 % SOLN Place 1 spray into both nostrils daily.  10/10/14   Historical Provider, MD  beclomethasone (QVAR) 80 MCG/ACT inhaler Inhale 2 puffs into the lungs 2 (two) times daily.    Historical Provider, MD  benzonatate (TESSALON) 100 MG capsule Take 100 mg by mouth 2 (two) times daily as needed for cough.    Historical Provider, MD  calcium acetate (PHOSLO) 667 MG capsule Take 667-1,334 mg by mouth 3 (three) times daily with meals. 1 capsule (667mg ) with breakfast and lunch, 2 capsules (1334mg ) with dinner    Historical Provider, MD  hydrALAZINE (APRESOLINE) 10 MG tablet Take 10 mg by mouth 2 (two) times daily.    Historical Provider, MD  imatinib (GLEEVEC) 100  MG tablet Take 100 mg by mouth daily. Take at 3:30pm daily 02/04/15   Historical Provider, MD  levofloxacin (LEVAQUIN) 500 MG tablet Take 1 tablet (500 mg total) by mouth once. Take on 03/27/15 03/27/15   Annette Leff, MD  multivitamin (RENA-VIT) TABS tablet Take 1 tablet by mouth daily.    Historical Provider, MD  nitroGLYCERIN (NITROSTAT) 0.4 MG SL tablet Place 0.4 mg under the tongue every 5 (five) minutes as needed for chest pain.    Historical Provider, MD  Nutritional Supplements (FEEDING SUPPLEMENT, NEPRO CARB STEADY,) LIQD Take 237 mLs by mouth as needed (missed meal during dialysis.). 11/20/14   Annette A Regalado, MD  pantoprazole (PROTONIX) 40 MG tablet Take 40 mg by mouth daily.    Historical Provider, MD  polyethylene glycol (MIRALAX / GLYCOLAX) packet Take 17 g by  mouth daily.    Historical Provider, MD  prochlorperazine (COMPAZINE) 10 MG tablet TAKE 1 TABLET EVERY 6 HOURS AS NEEDED FOR CHEMO NAUSEA-MAY TAKE AROUND THE CLOCK FOR PERSISTENT SYMP 01/30/15   Historical Provider, MD  sennosides-docusate sodium (SENOKOT-S) 8.6-50 MG tablet Take 1 tablet by mouth 2 (two) times daily as needed for constipation.    Historical Provider, MD  traMADol (ULTRAM) 50 MG tablet Take 50 mg by mouth every 6 (six) hours as needed for moderate pain.    Historical Provider, MD  VENTOLIN HFA 108 (90 BASE) MCG/ACT inhaler Inhale 2 puffs into the lungs every 4 (four) hours as needed for wheezing or shortness of breath.  10/12/14   Historical Provider, MD  warfarin (COUMADIN) 5 MG tablet TAKE 1.5 TABLETS (7.5 MG TOTAL) BY MOUTH ONE TIME ONLY AT 6 PM. 04/01/15   Annette Filler, MD     Social History   Social History  . Marital Status: Widowed    Spouse Name: N/A  . Number of Children: N/A  . Years of Education: N/A   Occupational History  . Not on file.   Social History Main Topics  . Smoking status: Never Smoker   . Smokeless tobacco: Never Used  . Alcohol Use: No  . Drug Use: No  . Sexual  Activity: Not on file   Other Topics Concern  . Not on file   Social History Narrative    Family Status  Relation Status Death Age  . Mother Deceased   . Father Deceased    Family History  Problem Relation Age of Onset  . Diabetes Mother   . Cancer Father   . Deep vein thrombosis Brother   . Hypertension Brother       ROS:  Full 14 point review of systems complete and found to be negative unless listed above.  Physical Exam: Blood pressure 112/52, pulse 82, temperature 98.5 F (36.9 C), temperature source Oral, resp. rate 16, height 5\' 2"  (1.575 m), weight 160 lb (72.576 kg), SpO2 99 %.  General: Well developed, well nourished, female in no acute distress Head: Eyes PERRLA, No xanthomas. Normocephalic and atraumatic, oropharynx without edema or exudate.  Lungs: Resp regular and unlabored, CTA. Heart: RRR no s3, s4, or murmurs..   Neck: No carotid bruits. No lymphadenopathy.  No JVD. Abdomen: Bowel sounds present, abdomen soft and non-tender without masses or hernias noted. Msk:  No spine or cva tenderness. No weakness, no joint deformities or effusions. Extremities: No clubbing, cyanosis or edema. DP/PT/Radials 2+ and equal bilaterally. Neuro: Roach and oriented X 3. No focal deficits noted. Psych:  Good affect, responds appropriately Skin: No rashes or lesions noted.  Labs:   Lab Results  Component Value Date   WBC 7.2 07/11/2015   HGB 12.3 07/11/2015   HCT 37.5 07/11/2015   MCV 88.0 07/11/2015   PLT 218 07/11/2015    Recent Labs  07/11/15 0940  INR 2.86*    Recent Labs Lab 07/11/15 0940  NA 137  K 4.2  CL 97*  CO2 25  BUN 39*  CREATININE 7.46*  CALCIUM 7.8*  GLUCOSE 102*   MAGNESIUM  Date Value Ref Range Status  02/23/2015 2.1 1.7 - 2.4 mg/dL Final    Recent Labs  07/11/15 0946  TROPIPOC 0.08   Echo: 02/24/15    Study Conclusions  - Left ventricle: The cavity size was normal. Wall thickness was increased in a pattern of moderate  LVH. Systolic function was mildly reduced. The  estimated ejection fraction was in the range of 45% to 50%. Basal inferior and basal inferolateral akinesis, mid inferior and mid inferolateral hypokinesis. Indeterminant diastolic function. - Aortic valve: Trileaflet; moderately calcified leaflets. Sclerosis without stenosis. There was mild regurgitation. - Mitral valve: Mildly to moderately calcified annulus. There was moderate to severe regurgitation. This may be infarct-related given the inferior and inferolateral wall motion abnormalities. - Left atrium: The atrium was mildly dilated. - Right ventricle: The cavity size was normal. Systolic function was mildly reduced. - Tricuspid valve: Peak RV-RA gradient (S): 54 mm Hg. - Pulmonary arteries: PA peak pressure: 69 mm Hg (S). - Systemic veins: IVC measured 2.2 cm with < 50% respirophasic variation, suggesting RA pressure 15 mmHg. Impressions: - Normal LV size with moderate LV hypertrophy. EF 45-50% with wall motion abnormalities as noted above. Moderate diastolic dysfunction. Normal RV size with mildly reduced systolic function. Moderate to severe MR, may be infarct-related given inferior and inferolateral wall motion abnormalities. Moderate to severe pulmonary hypertension. Dilated IVC.  ECG:  Vent. rate 80 BPM PR interval 269 ms QRS duration 121 ms QT/QTc 468/540 ms P-R-T axes 12 -53 244  Radiology:  Dg Chest 2 View  07/11/2015  CLINICAL DATA:  Chest pain and cough for 2 days EXAM: CHEST - 2 VIEW COMPARISON:  03/24/2015 FINDINGS: A right-sided dialysis catheter is now noted in place. The cardiac shadow remains mildly enlarged. The lungs are well aerated bilaterally. No focal confluent infiltrate is noted. Some residual thickening of the minor fissure is noted on the right. No bony abnormality is seen. IMPRESSION: No acute abnormality noted. Electronically Signed   By: Annette Catalina M.D.   On: 07/11/2015  10:06    ASSESSMENT AND PLAN:     1. Chest pain  - Has both typical and atypical features. The pain was a responsive to SL sublingual nitroglycerin. POC trop negative. EKG without acute changes. - He has declined cardiac catheterization in the past. Currently she was unsure if she was to proceed with cath or not. Recommended IM admission and  cycle troponin. If rules out no further plan for cardiac evaluation. If abnormal troponin will further discuss with patient and family regarding cath. - Continue medical treatment for now  2. Chronic combined systolic and diastolic heart failure. - Echo 6/26 showed LV EF 40-45%  with inferior akinesis and MR which could reflect prior infarct.  - Previous euvolemic.  3. ESRD  - ON HD T, Th and sat. She will need dialysis today.   4. Hx of PE - Coumadin per pharmacy  5. GI stromal tumor -Family does not know the prognosis.  6. HTN - Controlled  Signed: Bhagat,Bhavinkumar, PA 07/11/2015, 10:46 AM Pager [phone]  Co-Sign MD As above, patient seen and examined. Briefly she is an 81 year old female with multiple medical problems including end-stage renal disease, diabetes mellitus, hypertension, prior pulmonary embolus, GI stromal tumor, presumed coronary artery disease for evaluation of chest pain. Patient has had problems with chest pain in the past. This morning she developed sharp substernal chest pain without radiation. There was no nausea or diaphoresis but there was mild dyspnea. Patient states pain did increase with inspiration. Pain with duration of approximately 4 hours now slightly improved. Electrocardiogram shows sinus rhythm, first-degree AV block, left axis deviation, left ventricular hypertrophy, right bundle branch block, Lateral T-wave inversion and prolonged QT interval. Difficult situation. She has significant risk factors and likely has coronary disease. However she also has multiple other medical problems including a  GI stromal  tumor that cannot be resected because of comorbidities. She is followed by a cardiologist in [place]. Apparently cardiac catheterization has been recommended in the past but the patient declined. Long discussion with she and her daughter today. She would like to avoid procedures if possible. She will be admitted and would recommend cycling enzymes to exclude myocardial infarction. Note her initial troponin is 0.08 and she does have baseline renal insufficiency which could explain mild elevation. If enzymes negative will plan medical therapy. Patient and daughter also understands the risk of undiagnosed coronary disease. If enzymes positive we will discuss possible catheterization to evaluate for possible lesion amenable to PCI. She would not be a candidate for coronary artery bypass graft. Would hold Coumadin today in case patient needs a procedure. Would also add statin. FU with her cardiologist in [place] following [place]. Kirk Ruths

## 2015-07-11 NOTE — ED Notes (Signed)
Dialysis patient access right chest treatment days are Tuesday, Thursday, Saturday. Last treatment Tuesday.

## 2015-07-11 NOTE — ED Notes (Signed)
EKG completed given to EDP.  

## 2015-07-11 NOTE — ED Notes (Signed)
Spoke with dialysis currently not ready for patient stated to transport patient to the floor.

## 2015-07-11 NOTE — Consult Note (Signed)
[facility] Renal Consultation Note    Indication for Consultation:  Management of ESRD/hemodialysis; anemia, hypertension/volume and secondary hyperparathyroidism PCP: Dr. Gilford Rile  HPI: Annette Roach is a 81 y.o. female with ESRD (TTS [facility]) since September 2013 with DM, HTN, CAD, COPD on chronic home O2, GERD, hx PE 01/2015, GIST on [place] who presents with CP and cough acute onset this am.  She states this has been the firs time she has had this kind of CP since June admission. Pain was SS/epigastric without N, V or diaphoresis. She was SOB but is not now.  She has not had fever or chills. She has lost some weight.  Appetite is marginal. She has some discomfort in right lower calf.  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 Pain actually was acccentuated by yawning. She had two left lower teeth extracted on Monday. Evaluation in the ED today showed clear CXR, EKG shows now acute changes, initial troponin 0.08  Last EF 40 - 45% She is due for dialysis today.    Past Medical History  Diagnosis Date  . COPD (chronic obstructive pulmonary disease) (HCC)     emphysema  . Neuropathy ([place])   . Hyperlipidemia   . Diabetes mellitus   . Depression   . Anemia   . Shortness of breath   . GERD (gastroesophageal reflux disease)   . Hyperparathyroidism ([place])   . Hypertension     sees Dr. Gilford Rile  . Headache(784.0)   . Arthritis   . CAD (coronary artery disease)     sees Dr. Jenne Campus, [facility] cardiology [facility], [place]  . Steal syndrome of hand ([place]) jan- 2014    left hand  . Wears glasses   . Wears hearing aid     right  . Presence of surgically created AV shunt for hemodialysis [facility])     old lt upper arm out-rt upper arm shunt in  . Anginal pain ([place])     Dr Raliegh Ip in [place], [place]  . History of kidney stones   . Constipation   . History of blood transfusion   . Asthma     years ago  . Chronic kidney disease     TTH [facility] [place], [place]- Hemo  .  Renal insufficiency     patient on dialysis for 3 years (today is 03/22/15  . Pneumonia 03/2015   Past Surgical History  Procedure Laterality Date  . Abdominal hysterectomy  1976  . Kidney stone surgery  2013  . Av fistula placement  01/20/2012    Procedure: ARTERIOVENOUS (AV) FISTULA CREATION;  Surgeon: Elam Dutch, MD;  Location: [place];  Service: Vascular;  Laterality: Left;  . Hemodialysis catheter  05/25/12    secondary to failed AVF   . Eye surgery      bilateral cataract removed  . Av fistula placement  09/05/2012    Procedure: INSERTION OF ARTERIOVENOUS (AV) GORE-TEX GRAFT ARM;  Surgeon: Elam Dutch, MD;  Location: [facility] OR;  Service: Vascular;  Laterality: Left;  Using 4-59mm x 45 cm Vascular stretch goretex graft  . Ligation of arteriovenous  fistula  09/05/2012    Procedure: LIGATION OF ARTERIOVENOUS  FISTULA;  Surgeon: Elam Dutch, MD;  Location: [place];  Service: Vascular;  Laterality: Left;  . Ligation arteriovenous gortex graft  09/05/2012    Procedure: LIGATION ARTERIOVENOUS GORTEX GRAFT;  Surgeon: Elam Dutch, MD;  Location: [place];  Service: Vascular;  Laterality: Left;  . Av  fistula placement Right 11/07/2012    Procedure: ARTERIOVENOUS (AV) FISTULA CREATION;  Surgeon: Elam Dutch, MD;  Location: [facility] OR;  Service: Vascular;  Laterality: Right;  Bascilic Vein Fistula  . Appendectomy    . Carpal tunnel release Left 02/10/2013    Procedure: CARPAL TUNNEL RELEASE;  Surgeon: Cammie Sickle., MD;  Location: [place];  Service: Orthopedics;  Laterality: Left;  . Bascilic vein transposition Right 03/15/2013    Procedure: 2ND STAGE BASCILIC VEIN TRANSPOSITION - RIGHT ARM;  Surgeon: Elam Dutch, MD;  Location: [place];  Service: Vascular;  Laterality: Right;  . Av fistula placement Right 05/15/2013    Procedure: INSERTION OF ARTERIOVENOUS (AV) GORE-TEX GRAFT ARM-RIGHT;  Surgeon: Elam Dutch, MD;  Location: [place];  Service: Vascular;  Laterality:  Right;  . Shuntogram N/A 07/27/2012    Procedure: Earney Mallet;  Surgeon: Rosetta Posner, MD;  Location: [facility] CATH LAB;  Service: Cardiovascular;  Laterality: N/A;   Family History  Problem Relation Age of Onset  . Diabetes Mother   . Cancer Father   . Deep vein thrombosis Brother   . Hypertension Brother    Social History:  reports that she has never smoked. She has never used smokeless tobacco. She reports that she does not drink alcohol or use illicit drugs. No Known Allergies Prior to Admission medications   Medication Sig Start Date End Date Taking? Authorizing Provider  Azelastine HCl 0.15 % SOLN Place 1 spray into both nostrils daily.  10/10/14  Yes Historical Provider, MD  beclomethasone (QVAR) 80 MCG/ACT inhaler Inhale 2 puffs into the lungs 2 (two) times daily as needed (shortness of breath).    Yes Historical Provider, MD  benzonatate (TESSALON) 100 MG capsule Take 100 mg by mouth 2 (two) times daily as needed for cough.   Yes Historical Provider, MD  calcium acetate (PHOSLO) 667 MG capsule Take 667-1,334 mg by mouth 3 (three) times daily with meals. 1 capsule (667mg ) with breakfast and lunch, 2 capsules (1334mg ) with dinner   Yes Historical Provider, MD  cinacalcet (SENSIPAR) 30 MG tablet Take 30 mg by mouth daily.   Yes Historical Provider, MD  hydrALAZINE (APRESOLINE) 10 MG tablet Take 10 mg by mouth 2 (two) times daily.   Yes Historical Provider, MD  imatinib (GLEEVEC) 100 MG tablet Take 200 mg by mouth daily. Take at 3:30pm daily 02/04/15  Yes Historical Provider, MD  ipratropium-albuterol (DUONEB) 0.5-2.5 (3) MG/3ML SOLN Take 3 mLs by nebulization every 6 (six) hours as needed (shortness of breath).   Yes Historical Provider, MD  metoprolol succinate (TOPROL-XL) 25 MG 24 hr tablet Take 25 mg by mouth daily.   Yes Historical Provider, MD  mirtazapine (REMERON) 15 MG tablet Take 15 mg by mouth at bedtime.   Yes Historical Provider, MD  multivitamin (RENA-VIT) TABS tablet Take 1 tablet  by mouth daily.   Yes Historical Provider, MD  nitroGLYCERIN (NITROSTAT) 0.4 MG SL tablet Place 0.4 mg under the tongue every 5 (five) minutes as needed for chest pain.   Yes Historical Provider, MD  ondansetron (ZOFRAN) 4 MG tablet Take 4 mg by mouth every 8 (eight) hours as needed for nausea or vomiting.   Yes Historical Provider, MD  oxyCODONE (OXY IR/ROXICODONE) 5 MG immediate release tablet Take 5 mg by mouth every 6 (six) hours as needed for severe pain.   Yes Historical Provider, MD  pantoprazole (PROTONIX) 40 MG tablet Take 40 mg by mouth daily.   Yes  Historical Provider, MD  penicillin v potassium (VEETID) 500 MG tablet Take 500 mg by mouth 4 (four) times daily. For ten days.  Started 07/08/15   Yes Historical Provider, MD  polyethylene glycol (MIRALAX / GLYCOLAX) packet Take 17 g by mouth daily.   Yes Historical Provider, MD  prochlorperazine (COMPAZINE) 10 MG tablet TAKE 1 TABLET EVERY 6 HOURS AS NEEDED FOR CHEMO NAUSEA-MAY TAKE AROUND THE CLOCK FOR PERSISTENT SYMP 01/30/15  Yes Historical Provider, MD  sennosides-docusate sodium (SENOKOT-S) 8.6-50 MG tablet Take 1 tablet by mouth 2 (two) times daily as needed for constipation.   Yes Historical Provider, MD  VENTOLIN HFA 108 (90 BASE) MCG/ACT inhaler Inhale 2 puffs into the lungs every 4 (four) hours as needed for wheezing or shortness of breath.  10/12/14  Yes Historical Provider, MD  warfarin (COUMADIN) 5 MG tablet TAKE 1.5 TABLETS (7.5 MG TOTAL) BY MOUTH ONE TIME ONLY AT 6 PM. Patient taking differently: take 1 tablet by mouth once daily 04/01/15  Yes Axel Filler, MD  levofloxacin (LEVAQUIN) 500 MG tablet Take 1 tablet (500 mg total) by mouth once. Take on 03/27/15 Patient not taking: Reported on 07/11/2015 03/27/15   Shela Leff, MD  Nutritional Supplements (FEEDING SUPPLEMENT, NEPRO CARB STEADY,) LIQD Take 237 mLs by mouth as needed (missed meal during dialysis.). Patient not taking: Reported on 07/11/2015 11/20/14   Belkys A  Regalado, MD   No current facility-administered medications for this encounter.   Current Outpatient Prescriptions  Medication Sig Dispense Refill  . Azelastine HCl 0.15 % SOLN Place 1 spray into both nostrils daily.   3  . beclomethasone (QVAR) 80 MCG/ACT inhaler Inhale 2 puffs into the lungs 2 (two) times daily as needed (shortness of breath).     . benzonatate (TESSALON) 100 MG capsule Take 100 mg by mouth 2 (two) times daily as needed for cough.    . calcium acetate (PHOSLO) 667 MG capsule Take 667-1,334 mg by mouth 3 (three) times daily with meals. 1 capsule (667mg ) with breakfast and lunch, 2 capsules (1334mg ) with dinner    . cinacalcet (SENSIPAR) 30 MG tablet Take 30 mg by mouth daily.    . hydrALAZINE (APRESOLINE) 10 MG tablet Take 10 mg by mouth 2 (two) times daily.    Marland Kitchen imatinib (GLEEVEC) 100 MG tablet Take 200 mg by mouth daily. Take at 3:30pm daily    . ipratropium-albuterol (DUONEB) 0.5-2.5 (3) MG/3ML SOLN Take 3 mLs by nebulization every 6 (six) hours as needed (shortness of breath).    . metoprolol succinate (TOPROL-XL) 25 MG 24 hr tablet Take 25 mg by mouth daily.    . mirtazapine (REMERON) 15 MG tablet Take 15 mg by mouth at bedtime.    . multivitamin (RENA-VIT) TABS tablet Take 1 tablet by mouth daily.    . nitroGLYCERIN (NITROSTAT) 0.4 MG SL tablet Place 0.4 mg under the tongue every 5 (five) minutes as needed for chest pain.    Marland Kitchen ondansetron (ZOFRAN) 4 MG tablet Take 4 mg by mouth every 8 (eight) hours as needed for nausea or vomiting.    Marland Kitchen oxyCODONE (OXY IR/ROXICODONE) 5 MG immediate release tablet Take 5 mg by mouth every 6 (six) hours as needed for severe pain.    . pantoprazole (PROTONIX) 40 MG tablet Take 40 mg by mouth daily.    . penicillin v potassium (VEETID) 500 MG tablet Take 500 mg by mouth 4 (four) times daily. For ten days.  Started 07/08/15    . polyethylene  glycol (MIRALAX / GLYCOLAX) packet Take 17 g by mouth daily.    . prochlorperazine (COMPAZINE) 10 MG  tablet TAKE 1 TABLET EVERY 6 HOURS AS NEEDED FOR CHEMO NAUSEA-MAY TAKE AROUND THE CLOCK FOR PERSISTENT SYMP  2  . sennosides-docusate sodium (SENOKOT-S) 8.6-50 MG tablet Take 1 tablet by mouth 2 (two) times daily as needed for constipation.    . VENTOLIN HFA 108 (90 BASE) MCG/ACT inhaler Inhale 2 puffs into the lungs every 4 (four) hours as needed for wheezing or shortness of breath.   3  . warfarin (COUMADIN) 5 MG tablet TAKE 1.5 TABLETS (7.5 MG TOTAL) BY MOUTH ONE TIME ONLY AT 6 PM. (Patient taking differently: take 1 tablet by mouth once daily) 30 tablet 0  . levofloxacin (LEVAQUIN) 500 MG tablet Take 1 tablet (500 mg total) by mouth once. Take on 03/27/15 (Patient not taking: Reported on 07/11/2015) 1 tablet 0  . Nutritional Supplements (FEEDING SUPPLEMENT, NEPRO CARB STEADY,) LIQD Take 237 mLs by mouth as needed (missed meal during dialysis.). (Patient not taking: Reported on 07/11/2015) 30 Can 0   Labs: Basic Metabolic Panel:  Recent Labs Lab 07/11/15 0940  NA 137  K 4.2  CL 97*  CO2 25  GLUCOSE 102*  BUN 39*  CREATININE 7.46*  CALCIUM 7.8*  CBC:  Recent Labs Lab 07/11/15 0940  WBC 7.2  HGB 12.3  HCT 37.5  MCV 88.0  PLT 218   Studies/Results: Dg Chest 2 View  07/11/2015  CLINICAL DATA:  Chest pain and cough for 2 days EXAM: CHEST - 2 VIEW COMPARISON:  03/24/2015 FINDINGS: A right-sided dialysis catheter is now noted in place. The cardiac shadow remains mildly enlarged. The lungs are well aerated bilaterally. No focal confluent infiltrate is noted. Some residual thickening of the minor fissure is noted on the right. No bony abnormality is seen. IMPRESSION: No acute abnormality noted. Electronically Signed   By: Inez Catalina M.D.   On: 07/11/2015 10:06    ROS: As per HPI otherwise negative. Physical Exam: Filed Vitals:   07/11/15 0930 07/11/15 0945 07/11/15 1015 07/11/15 1030  BP: 124/63 118/85 112/52 118/49  Pulse: 81 80 82 81  Temp:      TempSrc:      Resp: 17 19 16  18   Height:      Weight:      SpO2: 100% 99% 99% 97%     General: Elderly chronically ill appearing [place] female Head: Normocephalic, atraumatic, sclera non-icteric, mucus membranes are moist, multiple missing teeth Neck: Supple. JVD not elevated. Lungs: Clear bilaterally to auscultation without wheezes, rales, or rhonchi. Breathing is unlabored. Heart: RRR with S1 S2. No murmurs, rubs, or gallops appreciated. Abdomen: Soft, non-tender, non-distended with normoactive bowel sounds. No rebound/guarding. Lower extremities:without edema or ischemic changes, no open wounds right LE some tenderness on palpation; no obvious swelling or erythema. Neuro: Alert and oriented X 3. Moves all extremities spontaneously. Psych:  Responds to questions appropriately with a normal affect. Dialysis Access: right IJ  Dialysis Orders: [facility] T[facility] 4 hr 400/A 1.5 EDW 60.5 (down 8 kg since June) 2 K 2 Ca right IJ TDC (permament access) heaprin 5000 No Mircera , Fe or calcitriiol - had been on 2.25 calcitriol since 6/28 for ^ PTH- last dose 11/3 - not clear why stopped  Recent labs: iPTH 287 down from 800s in June, Ca 8.9 corr P 7.2 Alb 3.3, Hgb 11.6 11/3 - last Mircera was 75 10/18 INR 2.82  Kinetics: excellent; cath works  well, runs full treatments   Assessment/Plan: 1. chest pain - feeling better now; no volume excess on CXR; cycle troponins, cards following - pt DNR, has declined cath in the past OBS 2. ESRD -  TTS K 4.2 - use 3 K bath today, normally on 2 K 3. Hypertension/volume  - BP generally well controlled as outpt 120 - 140s pre HD and 105 - 120s post HD at EDW or slightly above with average UF volumes of 1-2 kg; on MTP succinate 25 qd, hydralazine 10 bid  4. Anemia  - Hgb 12.3 - no ESA or Fe for now 5. Metabolic bone disease -  Reviewed outpt labs - calcitriol may have been stopped in error - will resume 2.25 tiw, continue sensipar 30, phoslo 1 at B and L and 2 at supper; follow P here- may need to  adjsut 6. Nutrition - renal diet + vitamain; has had EDW lowered 8 kg since June. 7. GIST - on oral chemotx 8. Hx PE - on coumadin, INR therapeutic 9. DNR -signed 12/2014 Dr. Jacqualin Combes, PA-C [place] [phone] 07/11/2015, 11:52 AM

## 2015-07-11 NOTE — Progress Notes (Signed)
Patient was having chest pain 7/10 she described the pain as sharp. An EKG was completed. Gave the patient 3 nitro which did not relieve the pain. I then gave the patient oxycodone. Will continue to monitor.

## 2015-07-11 NOTE — ED Provider Notes (Signed)
CSN: [id number]     Arrival date & time 07/11/15  0912 History   First MD Initiated Contact with Patient 07/11/15 [phone]     Chief Complaint  Patient presents with  . Chest Pain     (Consider location/radiation/quality/duration/timing/severity/associated sxs/prior Treatment) Patient is a 81 y.o. female presenting with chest pain.  Chest Pain Pain location:  Substernal area Pain quality: pressure   Pain radiates to:  Does not radiate Pain radiates to the back: no   Pain severity:  Severe Onset quality:  Unable to specify Duration: since awakening this am. Timing:  Constant Progression:  Partially resolved Chronicity:  Recurrent Context: at rest   Relieved by:  Nothing Worsened by:  Nothing tried Associated symptoms: shortness of breath ("sometimes")   Associated symptoms: no abdominal pain, no cough, no nausea and not vomiting     Past Medical History  Diagnosis Date  . COPD (chronic obstructive pulmonary disease) (HCC)     emphysema  . Neuropathy ([place])   . Hyperlipidemia   . Diabetes mellitus   . Depression   . Anemia   . Shortness of breath   . GERD (gastroesophageal reflux disease)   . Hyperparathyroidism ([place])   . Hypertension     sees Dr. Gilford Rile  . Headache(784.0)   . Arthritis   . CAD (coronary artery disease)     sees Dr. Jenne Campus, [facility]ology [facility], [place]  . Steal syndrome of hand ([place]) jan- 2014    left hand  . Wears glasses   . Wears hearing aid     right  . Presence of surgically created AV shunt for hemodialysis [facility])     old lt upper arm out-rt upper arm shunt in  . Anginal pain ([place])     Dr Raliegh Ip in [place], [place]  . History of kidney stones   . Constipation   . History of blood transfusion   . Asthma     years ago  . Chronic kidney disease     TTH SAT [place], [place]- Hemo  . Renal insufficiency     patient on dialysis for 3 years (today is 03/22/15  . Pneumonia 03/2015   Past Surgical History  Procedure  Laterality Date  . Abdominal hysterectomy  1976  . Kidney stone surgery  2013  . Av fistula placement  01/20/2012    Procedure: ARTERIOVENOUS (AV) FISTULA CREATION;  Surgeon: Elam Dutch, MD;  Location: [place];  Service: Vascular;  Laterality: Left;  . Hemodialysis catheter  05/25/12    secondary to failed AVF   . Eye surgery      bilateral cataract removed  . Av fistula placement  09/05/2012    Procedure: INSERTION OF ARTERIOVENOUS (AV) GORE-TEX GRAFT ARM;  Surgeon: Elam Dutch, MD;  Location: [facility] OR;  Service: Vascular;  Laterality: Left;  Using 4-41mm x 45 cm Vascular stretch goretex graft  . Ligation of arteriovenous  fistula  09/05/2012    Procedure: LIGATION OF ARTERIOVENOUS  FISTULA;  Surgeon: Elam Dutch, MD;  Location: [place];  Service: Vascular;  Laterality: Left;  . Ligation arteriovenous gortex graft  09/05/2012    Procedure: LIGATION ARTERIOVENOUS GORTEX GRAFT;  Surgeon: Elam Dutch, MD;  Location: [place];  Service: Vascular;  Laterality: Left;  . Av fistula placement Right 11/07/2012    Procedure: ARTERIOVENOUS (AV) FISTULA CREATION;  Surgeon: Elam Dutch, MD;  Location[facility] OR;  Service: Vascular;  Laterality: Right;  Bascilic Vein Fistula  .  Appendectomy    . Carpal tunnel release Left 02/10/2013    Procedure: CARPAL TUNNEL RELEASE;  Surgeon: Cammie Sickle., MD;  Location: [place];  Service: Orthopedics;  Laterality: Left;  . Bascilic vein transposition Right 03/15/2013    Procedure: 2ND STAGE BASCILIC VEIN TRANSPOSITION - RIGHT ARM;  Surgeon: Elam Dutch, MD;  Location: [place];  Service: Vascular;  Laterality: Right;  . Av fistula placement Right 05/15/2013    Procedure: INSERTION OF ARTERIOVENOUS (AV) GORE-TEX GRAFT ARM-RIGHT;  Surgeon: Elam Dutch, MD;  Location: [place];  Service: Vascular;  Laterality: Right;  . Shuntogram N/A 07/27/2012    Procedure: Earney Mallet;  Surgeon: Rosetta Posner, MD;  Location: [facility] CATH LAB;  Service:  Cardiovascular;  Laterality: N/A;   Family History  Problem Relation Age of Onset  . Diabetes Mother   . Cancer Father   . Deep vein thrombosis Brother   . Hypertension Brother    Social History  Substance Use Topics  . Smoking status: Never Smoker   . Smokeless tobacco: Never Used  . Alcohol Use: No   OB History    No data available     Review of Systems  Respiratory: Positive for shortness of breath ("sometimes"). Negative for cough.   Cardiovascular: Positive for chest pain.  Gastrointestinal: Negative for nausea, vomiting and abdominal pain.  All other systems reviewed and are negative.     Allergies  Review of patient's allergies indicates no known allergies.  Home Medications   Prior to Admission medications   Medication Sig Start Date End Date Taking? Authorizing Provider  Azelastine HCl 0.15 % SOLN Place 1 spray into both nostrils daily.  10/10/14  Yes Historical Provider, MD  beclomethasone (QVAR) 80 MCG/ACT inhaler Inhale 2 puffs into the lungs 2 (two) times daily as needed (shortness of breath).    Yes Historical Provider, MD  benzonatate (TESSALON) 100 MG capsule Take 100 mg by mouth 2 (two) times daily as needed for cough.   Yes Historical Provider, MD  calcium acetate (PHOSLO) 667 MG capsule Take 667-1,334 mg by mouth 3 (three) times daily with meals. 1 capsule (667mg ) with breakfast and lunch, 2 capsules (1334mg ) with dinner   Yes Historical Provider, MD  cinacalcet (SENSIPAR) 30 MG tablet Take 30 mg by mouth daily.   Yes Historical Provider, MD  hydrALAZINE (APRESOLINE) 10 MG tablet Take 10 mg by mouth 2 (two) times daily.   Yes Historical Provider, MD  imatinib (GLEEVEC) 100 MG tablet Take 200 mg by mouth daily. Take at 3:30pm daily 02/04/15  Yes Historical Provider, MD  ipratropium-albuterol (DUONEB) 0.5-2.5 (3) MG/3ML SOLN Take 3 mLs by nebulization every 6 (six) hours as needed (shortness of breath).   Yes Historical Provider, MD  metoprolol succinate  (TOPROL-XL) 25 MG 24 hr tablet Take 25 mg by mouth daily.   Yes Historical Provider, MD  mirtazapine (REMERON) 15 MG tablet Take 15 mg by mouth at bedtime.   Yes Historical Provider, MD  multivitamin (RENA-VIT) TABS tablet Take 1 tablet by mouth daily.   Yes Historical Provider, MD  nitroGLYCERIN (NITROSTAT) 0.4 MG SL tablet Place 0.4 mg under the tongue every 5 (five) minutes as needed for chest pain.   Yes Historical Provider, MD  ondansetron (ZOFRAN) 4 MG tablet Take 4 mg by mouth every 8 (eight) hours as needed for nausea or vomiting.   Yes Historical Provider, MD  oxyCODONE (OXY IR/ROXICODONE) 5 MG immediate release tablet Take 5 mg by  mouth every 6 (six) hours as needed for severe pain.   Yes Historical Provider, MD  pantoprazole (PROTONIX) 40 MG tablet Take 40 mg by mouth daily.   Yes Historical Provider, MD  penicillin v potassium (VEETID) 500 MG tablet Take 500 mg by mouth 4 (four) times daily. For ten days.  Started 07/08/15   Yes Historical Provider, MD  polyethylene glycol (MIRALAX / GLYCOLAX) packet Take 17 g by mouth daily.   Yes Historical Provider, MD  prochlorperazine (COMPAZINE) 10 MG tablet TAKE 1 TABLET EVERY 6 HOURS AS NEEDED FOR CHEMO NAUSEA-MAY TAKE AROUND THE CLOCK FOR PERSISTENT SYMP 01/30/15  Yes Historical Provider, MD  sennosides-docusate sodium (SENOKOT-S) 8.6-50 MG tablet Take 1 tablet by mouth 2 (two) times daily as needed for constipation.   Yes Historical Provider, MD  VENTOLIN HFA 108 (90 BASE) MCG/ACT inhaler Inhale 2 puffs into the lungs every 4 (four) hours as needed for wheezing or shortness of breath.  10/12/14  Yes Historical Provider, MD  warfarin (COUMADIN) 5 MG tablet TAKE 1.5 TABLETS (7.5 MG TOTAL) BY MOUTH ONE TIME ONLY AT 6 PM. Patient taking differently: take 1 tablet by mouth once daily 04/01/15  Yes Axel Filler, MD  levofloxacin (LEVAQUIN) 500 MG tablet Take 1 tablet (500 mg total) by mouth once. Take on 03/27/15 Patient not taking: Reported on  07/11/2015 03/27/15   Shela Leff, MD  Nutritional Supplements (FEEDING SUPPLEMENT, NEPRO CARB STEADY,) LIQD Take 237 mLs by mouth as needed (missed meal during dialysis.). Patient not taking: Reported on 07/11/2015 11/20/14   Belkys A Regalado, MD   BP 118/49 mmHg  Pulse 81  Temp(Src) 98.5 F (36.9 C) (Oral)  Resp 18  Ht 5\' 2"  (1.575 m)  Wt 160 lb (72.576 kg)  BMI 29.26 kg/m2  SpO2 97% Physical Exam  Constitutional: She is oriented to person, place, and time. She appears well-developed and well-nourished.  HENT:  Head: Normocephalic and atraumatic.  Right Ear: External ear normal.  Left Ear: External ear normal.  Eyes: Conjunctivae and EOM are normal. Pupils are equal, round, and reactive to light.  Neck: Normal range of motion. Neck supple.  Cardiovascular: Normal rate, regular rhythm, normal heart sounds and intact distal pulses.   Pulmonary/Chest: Effort normal and breath sounds normal.  Abdominal: Soft. Bowel sounds are normal. There is no tenderness.  Musculoskeletal: Normal range of motion.  Neurological: She is alert and oriented to person, place, and time.  Skin: Skin is warm and dry.  Vitals reviewed.   ED Course  Procedures (including critical care time) Labs Review Labs Reviewed  BASIC METABOLIC PANEL - Abnormal; Notable for the following:    Chloride 97 (*)    Glucose, Bld 102 (*)    BUN 39 (*)    Creatinine, Ser 7.46 (*)    Calcium 7.8 (*)    GFR calc non Af Amer 4 (*)    GFR calc Af Amer 5 (*)    All other components within normal limits  CBC - Abnormal; Notable for the following:    RDW 15.8 (*)    All other components within normal limits  PROTIME-INR - Abnormal; Notable for the following:    Prothrombin Time 29.6 (*)    INR 2.86 (*)    All other components within normal limits  I-STAT TROPOININ, ED    Imaging Review Dg Chest 2 View  07/11/2015  CLINICAL DATA:  Chest pain and cough for 2 days EXAM: CHEST - 2 VIEW COMPARISON:  03/24/2015  FINDINGS: A right-sided dialysis catheter is now noted in place. The cardiac shadow remains mildly enlarged. The lungs are well aerated bilaterally. No focal confluent infiltrate is noted. Some residual thickening of the minor fissure is noted on the right. No bony abnormality is seen. IMPRESSION: No acute abnormality noted. Electronically Signed   By: Inez Catalina M.D.   On: 07/11/2015 10:06   I have personally reviewed and evaluated these images and lab results as part of my medical decision-making.   EKG Interpretation   Date/Time:  Thursday July 11 2015 09:13:10 EST Ventricular Rate:  80 PR Interval:  269 QRS Duration: 121 QT Interval:  468 QTC Calculation: 540 R Axis:   -53 Text Interpretation:  Sinus rhythm Atrial premature complex Prolonged PR  interval Probable left atrial enlargement IVCD, consider atypical RBBB LVH  with IVCD, LAD and secondary repol abnrm Inferior infarct, age  indeterminate Minimal ST elevation, anterolateral leads Prolonged QT  interval T wave inversions now present in inferolateral leads Confirmed by  Debby Freiberg [phone]) on 07/11/2015 9:45:17 AM      MDM   Final diagnoses:  None    81 y.o. female with pertinent PMH of CAD, CHF, COPD, ESRD ([facility]) presents with recurrent chest pain.  ECG with new t wave inversions.  Elba Barman otherwise unremarkable.    Consulted cardiology and hospitalist for admission  I have reviewed all laboratory and imaging studies if ordered as above  No diagnosis found.      Debby Freiberg, MD 07/11/15 1154

## 2015-07-11 NOTE — Progress Notes (Addendum)
CONSULT NOTE - INITIAL  Pharmacy Consult for coumadin Indication: hx PE  No Known Allergies  Patient Measurements: Height: 5\' 2"  (157.5 cm) Weight: 132 lb 6.4 oz (60.056 kg) IBW/kg (Calculated) : 50.1   Vital Signs: Temp: 98.5 F (36.9 C) (11/10 1333) Temp Source: Oral (11/10 1333) BP: 120/86 mmHg (11/10 1333) Pulse Rate: 84 (11/10 1333) Intake/Output from previous day:   Intake/Output from this shift:    Labs:  Recent Labs  07/11/15 0940  WBC 7.2  HGB 12.3  PLT 218  CREATININE 7.46*   Estimated Creatinine Clearance: 4.4 mL/min (by C-G formula based on Cr of 7.46). No results for input(s): VANCOTROUGH, VANCOPEAK, VANCORANDOM, GENTTROUGH, GENTPEAK, GENTRANDOM, TOBRATROUGH, TOBRAPEAK, TOBRARND, AMIKACINPEAK, AMIKACINTROU, AMIKACIN in the last 72 hours.   Microbiology: No results found for this or any previous visit (from the past 720 hour(s)).  Medical History: Past Medical History  Diagnosis Date  . COPD (chronic obstructive pulmonary disease) (HCC)     emphysema  . Neuropathy ([place])   . Hyperlipidemia   . Diabetes mellitus   . Depression   . Anemia   . Shortness of breath   . GERD (gastroesophageal reflux disease)   . Hyperparathyroidism ([place])   . Hypertension     sees Dr. Gilford Rile  . Headache(784.0)   . Arthritis   . CAD (coronary artery disease)     sees Dr. Jenne Campus, [facility]ology [facility], [place]  . Steal syndrome of hand ([place]) jan- 2014    left hand  . Wears glasses   . Wears hearing aid     right  . Presence of surgically created AV shunt for hemodialysis [facility])     old lt upper arm out-rt upper arm shunt in  . Anginal pain ([place])     Dr Raliegh Ip in [place], [place]  . History of kidney stones   . Constipation   . History of blood transfusion   . Asthma     years ago  . Chronic kidney disease     TTH SAT [place], [place]- Hemo  . Renal insufficiency     patient on dialysis for 3 years (today is 03/22/15  . Pneumonia 03/2015     Medications:  Prescriptions prior to admission  Medication Sig Dispense Refill Last Dose  . Azelastine HCl 0.15 % SOLN Place 1 spray into both nostrils daily.   3 07/11/2015 at Unknown time  . beclomethasone (QVAR) 80 MCG/ACT inhaler Inhale 2 puffs into the lungs 2 (two) times daily as needed (shortness of breath).    03/22/2015 at Unknown time  . benzonatate (TESSALON) 100 MG capsule Take 100 mg by mouth 2 (two) times daily as needed for cough.   Past Week at Unknown time  . calcium acetate (PHOSLO) 667 MG capsule Take 667-1,334 mg by mouth 3 (three) times daily with meals. 1 capsule (667mg ) with breakfast and lunch, 2 capsules (1334mg ) with dinner   07/10/2015 at Unknown time  . cinacalcet (SENSIPAR) 30 MG tablet Take 30 mg by mouth daily.   07/10/2015 at Unknown time  . hydrALAZINE (APRESOLINE) 10 MG tablet Take 10 mg by mouth 2 (two) times daily.   07/10/2015 at Unknown time  . imatinib (GLEEVEC) 100 MG tablet Take 200 mg by mouth daily. Take at 3:30pm daily   07/10/2015 at Unknown time  . ipratropium-albuterol (DUONEB) 0.5-2.5 (3) MG/3ML SOLN Take 3 mLs by nebulization every 6 (six) hours as needed (shortness of breath).     . metoprolol succinate (TOPROL-XL)  25 MG 24 hr tablet Take 25 mg by mouth daily.   07/10/2015 at 1200  . mirtazapine (REMERON) 15 MG tablet Take 15 mg by mouth at bedtime.     . multivitamin (RENA-VIT) TABS tablet Take 1 tablet by mouth daily.   07/10/2015 at Unknown time  . nitroGLYCERIN (NITROSTAT) 0.4 MG SL tablet Place 0.4 mg under the tongue every 5 (five) minutes as needed for chest pain.   07/11/2015 at Unknown time  . ondansetron (ZOFRAN) 4 MG tablet Take 4 mg by mouth every 8 (eight) hours as needed for nausea or vomiting.     Marland Kitchen oxyCODONE (OXY IR/ROXICODONE) 5 MG immediate release tablet Take 5 mg by mouth every 6 (six) hours as needed for severe pain.   07/10/2015 at Unknown time  . pantoprazole (PROTONIX) 40 MG tablet Take 40 mg by mouth daily.   07/10/2015 at  Unknown time  . penicillin v potassium (VEETID) 500 MG tablet Take 500 mg by mouth 4 (four) times daily. For ten days.  Started 07/08/15     . polyethylene glycol (MIRALAX / GLYCOLAX) packet Take 17 g by mouth daily.   07/11/2015 at Unknown time  . prochlorperazine (COMPAZINE) 10 MG tablet TAKE 1 TABLET EVERY 6 HOURS AS NEEDED FOR CHEMO NAUSEA-MAY TAKE AROUND THE CLOCK FOR PERSISTENT SYMP  2 03/22/2015 at Unknown time  . sennosides-docusate sodium (SENOKOT-S) 8.6-50 MG tablet Take 1 tablet by mouth 2 (two) times daily as needed for constipation.   Past Week at Unknown time  . VENTOLIN HFA 108 (90 BASE) MCG/ACT inhaler Inhale 2 puffs into the lungs every 4 (four) hours as needed for wheezing or shortness of breath.   3 07/10/2015 at Unknown time  . warfarin (COUMADIN) 5 MG tablet TAKE 1.5 TABLETS (7.5 MG TOTAL) BY MOUTH ONE TIME ONLY AT 6 PM. (Patient taking differently: take 1 tablet by mouth once daily) 30 tablet 0 07/10/2015 at Unknown time   Scheduled:  . Azelastine HCl  1 spray Each Nare Daily  . [START ON 07/13/2015] calcitRIOL  2.25 mcg Oral Q T,Th,Sa-HD  . calcium acetate  667-1,334 mg Oral TID WC  . cinacalcet  30 mg Oral Daily  . hydrALAZINE  10 mg Oral BID  . imatinib  200 mg Oral Daily  . metoprolol succinate  25 mg Oral Daily  . mirtazapine  15 mg Oral QHS  . multivitamin  1 tablet Oral Daily  . pantoprazole  40 mg Oral Daily  . penicillin v potassium  500 mg Oral QID  . polyethylene glycol  17 g Oral Daily    Assessment: 81 yo female her with CP. She is on coumadin PTA for history of PE (01/2015).INR is 2.86 and at goal. Pharmacy has been consulted to dose coumadin. Per cardiology notes: Would hold Coumadin today in case patient needs a procedure  Home coumadin dose: 7.5mg /day (last dose on 07/10/15)  Goal of Therapy:  INR= 2-3 Platelet monitoring per protocol: yes  Plan: -Hold coumadin today -Daily PT/INR  Hildred Laser, Pharm D 07/11/2015 1:55 PM

## 2015-07-11 NOTE — ED Notes (Signed)
Admit Doctor at bedside.  

## 2015-07-11 NOTE — ED Notes (Signed)
Cardiologist at bedside.  

## 2015-07-12 DIAGNOSIS — R072 Precordial pain: Secondary | ICD-10-CM | POA: Diagnosis not present

## 2015-07-12 DIAGNOSIS — E785 Hyperlipidemia, unspecified: Secondary | ICD-10-CM | POA: Diagnosis not present

## 2015-07-12 DIAGNOSIS — G629 Polyneuropathy, unspecified: Secondary | ICD-10-CM | POA: Diagnosis not present

## 2015-07-12 DIAGNOSIS — R079 Chest pain, unspecified: Secondary | ICD-10-CM | POA: Diagnosis not present

## 2015-07-12 DIAGNOSIS — N186 End stage renal disease: Secondary | ICD-10-CM | POA: Diagnosis not present

## 2015-07-12 DIAGNOSIS — Z992 Dependence on renal dialysis: Secondary | ICD-10-CM

## 2015-07-12 DIAGNOSIS — Z86711 Personal history of pulmonary embolism: Secondary | ICD-10-CM | POA: Diagnosis not present

## 2015-07-12 DIAGNOSIS — R071 Chest pain on breathing: Secondary | ICD-10-CM

## 2015-07-12 DIAGNOSIS — J441 Chronic obstructive pulmonary disease with (acute) exacerbation: Secondary | ICD-10-CM | POA: Diagnosis not present

## 2015-07-12 LAB — PROTIME-INR
INR: 2.58 — AB (ref 0.00–1.49)
PROTHROMBIN TIME: 27.3 s — AB (ref 11.6–15.2)

## 2015-07-12 LAB — GLUCOSE, CAPILLARY
GLUCOSE-CAPILLARY: 96 mg/dL (ref 65–99)
Glucose-Capillary: 83 mg/dL (ref 65–99)

## 2015-07-12 LAB — TROPONIN I: TROPONIN I: 0.07 ng/mL — AB (ref <0.031)

## 2015-07-12 MED ORDER — WARFARIN - PHARMACIST DOSING INPATIENT: Freq: Every day | Status: DC

## 2015-07-12 MED ORDER — WARFARIN SODIUM 5 MG PO TABS: 5.0000 mg | ORAL_TABLET | Freq: Every day | ORAL | Status: DC

## 2015-07-12 MED ORDER — OXYCODONE HCL 5 MG PO TABS: 5.0000 mg | ORAL_TABLET | Freq: Four times a day (QID) | ORAL | Status: DC | PRN

## 2015-07-12 MED ORDER — ACETAMINOPHEN 325 MG PO TABS: 650.0000 mg | ORAL_TABLET | Freq: Four times a day (QID) | ORAL | Status: AC | PRN

## 2015-07-12 NOTE — Discharge Summary (Signed)
PATIENT DETAILS Name: Annette Roach Age: 81 y.o. Sex: female Date of Birth: 05/30/1928 MRN: [id number]. Admitting Physician: Waldemar Dickens, MD [id number], MD  Admit Date: 07/11/2015 Discharge date: 07/12/2015  Recommendations for Outpatient Follow-up:  1. Ensure follow-up with her primary cardiologist in [place] DIAGNOSIS:  Active Problems:   End stage renal disease on dialysis ([place])   GERD (gastroesophageal reflux disease)   Hypertension   CAD (coronary artery disease)   Chest pain   Gastric cancer (HCC)   History of pulmonary embolism   S/P tooth extraction   Constipation   Chronic combined systolic and diastolic CHF (congestive heart failure) (HCC)   Prolonged Q-T interval on ECG      PAST MEDICAL HISTORY: Past Medical History  Diagnosis Date  . COPD (chronic obstructive pulmonary disease) (HCC)     emphysema  . Neuropathy ([place])   . Hyperlipidemia   . Diabetes mellitus   . Depression   . Anemia   . Shortness of breath   . GERD (gastroesophageal reflux disease)   . Hyperparathyroidism ([place])   . Hypertension     sees Dr. Gilford Rile  . Headache(784.0)   . Arthritis   . CAD (coronary artery disease)     sees Dr. Jenne Campus, [facility] cardiology [place], [place]  . Steal syndrome of hand ([place]) jan- 2014    left hand  . Wears glasses   . Wears hearing aid     right  . Presence of surgically created AV shunt for hemodialysis [facility])     old lt upper arm out-rt upper arm shunt in  . Anginal pain ([place])     Dr Raliegh Ip in [place], [place]  . History of kidney stones   . Constipation   . History of blood transfusion   . Asthma     years ago  . Chronic kidney disease     [facility] [place]- Hemo  . Renal insufficiency     patient on dialysis for 3 years (today is 03/22/15  . Pneumonia 03/2015    DISCHARGE MEDICATIONS: Current Discharge Medication List    START taking these medications   Details  acetaminophen  (TYLENOL) 325 MG tablet Take 2 tablets (650 mg total) by mouth every 6 (six) hours as needed for mild pain or headache.      CONTINUE these medications which have CHANGED   Details  oxyCODONE (OXY IR/ROXICODONE) 5 MG immediate release tablet Take 1 tablet (5 mg total) by mouth every 6 (six) hours as needed for severe pain. Qty: 20 tablet, Refills: 0      CONTINUE these medications which have NOT CHANGED   Details  Azelastine HCl 0.15 % SOLN Place 1 spray into both nostrils daily.  Refills: 3    beclomethasone (QVAR) 80 MCG/ACT inhaler Inhale 2 puffs into the lungs 2 (two) times daily as needed (shortness of breath).     benzonatate (TESSALON) 100 MG capsule Take 100 mg by mouth 2 (two) times daily as needed for cough.    calcium acetate (PHOSLO) 667 MG capsule Take 667-1,334 mg by mouth 3 (three) times daily with meals. 1 capsule (667mg ) with breakfast and lunch, 2 capsules (1334mg ) with dinner    cinacalcet (SENSIPAR) 30 MG tablet Take 30 mg by mouth daily.    hydrALAZINE (APRESOLINE) 10 MG tablet Take 10 mg by mouth 2 (two) times daily.    imatinib (GLEEVEC) 100 MG tablet Take 200 mg by mouth daily. Take at  3:30pm daily    ipratropium-albuterol (DUONEB) 0.5-2.5 (3) MG/3ML SOLN Take 3 mLs by nebulization every 6 (six) hours as needed (shortness of breath).    metoprolol succinate (TOPROL-XL) 25 MG 24 hr tablet Take 25 mg by mouth daily.    mirtazapine (REMERON) 15 MG tablet Take 15 mg by mouth at bedtime.    multivitamin (RENA-VIT) TABS tablet Take 1 tablet by mouth daily.    nitroGLYCERIN (NITROSTAT) 0.4 MG SL tablet Place 0.4 mg under the tongue every 5 (five) minutes as needed for chest pain.    ondansetron (ZOFRAN) 4 MG tablet Take 4 mg by mouth every 8 (eight) hours as needed for nausea or vomiting.    pantoprazole (PROTONIX) 40 MG tablet Take 40 mg by mouth daily.    penicillin v potassium (VEETID) 500 MG tablet Take 500 mg by mouth 4 (four) times daily. For ten days.   Started 07/08/15    polyethylene glycol (MIRALAX / GLYCOLAX) packet Take 17 g by mouth daily.    prochlorperazine (COMPAZINE) 10 MG tablet TAKE 1 TABLET EVERY 6 HOURS AS NEEDED FOR CHEMO NAUSEA-MAY TAKE AROUND THE CLOCK FOR PERSISTENT SYMP Refills: 2    sennosides-docusate sodium (SENOKOT-S) 8.6-50 MG tablet Take 1 tablet by mouth 2 (two) times daily as needed for constipation.    VENTOLIN HFA 108 (90 BASE) MCG/ACT inhaler Inhale 2 puffs into the lungs every 4 (four) hours as needed for wheezing or shortness of breath.  Refills: 3    warfarin (COUMADIN) 5 MG tablet TAKE 1.5 TABLETS (7.5 MG TOTAL) BY MOUTH ONE TIME ONLY AT 6 PM. Qty: 30 tablet, Refills: 0        ALLERGIES:  No Known Allergies  BRIEF HPI:  See H&P, Labs, Consult and Test reports for all details in brief, patient was admitted for evaluation of chest pain.  CONSULTATIONS:   cardiology and nephrology  PERTINENT RADIOLOGIC STUDIES: Dg Chest 2 View  07/11/2015  CLINICAL DATA:  Chest pain and cough for 2 days EXAM: CHEST - 2 VIEW COMPARISON:  03/24/2015 FINDINGS: A right-sided dialysis catheter is now noted in place. The cardiac shadow remains mildly enlarged. The lungs are well aerated bilaterally. No focal confluent infiltrate is noted. Some residual thickening of the minor fissure is noted on the right. No bony abnormality is seen. IMPRESSION: No acute abnormality noted. Electronically Signed   By: Inez Catalina M.D.   On: 07/11/2015 10:06     PERTINENT LAB RESULTS: CBC:  Recent Labs  07/11/15 0940  WBC 7.2  HGB 12.3  HCT 37.5  PLT 218   CMET CMP     Component Value Date/Time   NA 137 07/11/2015 0940   K 4.2 07/11/2015 0940   CL 97* 07/11/2015 0940   CO2 25 07/11/2015 0940   GLUCOSE 102* 07/11/2015 0940   BUN 39* 07/11/2015 0940   CREATININE 7.46* 07/11/2015 0940   CALCIUM 7.8* 07/11/2015 0940   PROT 7.8 02/23/2015 0920   ALBUMIN 3.0* 03/25/2015 0436   AST 19 02/23/2015 0920   ALT 8* 02/23/2015  0920   ALKPHOS 78 02/23/2015 0920   BILITOT 0.4 02/23/2015 0920   GFRNONAA 4* 07/11/2015 0940   GFRAA 5* 07/11/2015 0940    GFR Estimated Creatinine Clearance: 4.4 mL/min (by C-G formula based on Cr of 7.46). No results for input(s): LIPASE, AMYLASE in the last 72 hours.  Recent Labs  07/11/15 1503 07/11/15 1800 07/11/15 2337  TROPONINI 0.07* 0.07* 0.07*   Invalid input(s): POCBNP No results  for input(s): DDIMER in the last 72 hours. No results for input(s): HGBA1C in the last 72 hours. No results for input(s): CHOL, HDL, LDLCALC, TRIG, CHOLHDL, LDLDIRECT in the last 72 hours. No results for input(s): TSH, T4TOTAL, T3FREE, THYROIDAB in the last 72 hours.  Invalid input(s): FREET3 No results for input(s): VITAMINB12, FOLATE, FERRITIN, TIBC, IRON, RETICCTPCT in the last 72 hours. Coags:  Recent Labs  07/11/15 0940 07/12/15 0541  INR 2.86* 2.58*   Microbiology: No results found for this or any previous visit (from the past 240 hour(s)).   BRIEF HOSPITAL COURSE:   Active Problems: Chest pain: Likely atypical-as it is easily reproducible with palpation and also with respiration. Not hypoxic or tachycardic-but already on Coumadin. High suspicion for musculoskeletal  etioogy. Cardiac enzymes only minimally elevated-current trend is flat-not suggestive of ACS. Patient continues to refuse invasive/aggressive treatment like cardiac catheterization. Since pain felt to be atypical, stable for discharge-cleared by cardiology. Patient instructed to follow up with her primary cardiologist. End stage renal disease on dialysis: Continue to follow up with dialysis Center at regular schedule.  History of pulmonary embolism: On Coumadin-INR therapeutic.  Rest of the patient's medical problems were stable during this hospitalization.  DO NOT RESUSCITATE   TODAY-DAY OF DISCHARGE:  Subjective:   Annette Roach today has no headache,no chest abdominal pain,no new weakness tingling or  numbness, feels much better wants to go home today.   Objective:   Blood pressure 116/46, pulse 84, temperature 98.7 F (37.1 C), temperature source Oral, resp. rate 16, height 5\' 2"  (1.575 m), weight 59.467 kg (131 lb 1.6 oz), SpO2 97 %.  Intake/Output Summary (Last 24 hours) at 07/12/15 1020 Last data filed at 07/12/15 0900  Gross per 24 hour  Intake    720 ml  Output    500 ml  Net    220 ml   Filed Weights   07/11/15 1410 07/11/15 1811 07/12/15 0500  Weight: 59.9 kg (132 lb 0.9 oz) 59.4 kg (130 lb 15.3 oz) 59.467 kg (131 lb 1.6 oz)    Exam Awake Alert, Oriented *3, No new F.N deficits, Normal affect [place].AT,PERRAL Supple Neck,No JVD, No cervical lymphadenopathy appriciated.  Symmetrical Chest wall movement, Good air movement bilaterally, CTAB RRR,No Gallops,Rubs or new Murmurs, No Parasternal Heave +ve B.Sounds, Abd Soft, Non tender, No organomegaly appriciated, No rebound -guarding or rigidity. No Cyanosis, Clubbing or edema, No new Rash or bruise  DISCHARGE CONDITION: Stable  DISPOSITION: Home  DISCHARGE INSTRUCTIONS:    Activity:  As tolerated with Full fall precautions use walker/cane & assistance as needed  Get Medicines reviewed and adjusted: Please take all your medications with you for your next visit with your Primary MD  Please request your Primary MD to go over all hospital tests and procedure/radiological results at the follow up, please ask your Primary MD to get all Hospital records sent to his/her office.  If you experience worsening of your admission symptoms, develop shortness of breath, life threatening emergency, suicidal or homicidal thoughts you must seek medical attention immediately by calling 911 or calling your MD immediately  if symptoms less severe.  You must read complete instructions/literature along with all the possible adverse reactions/side effects for all the Medicines you take and that have been prescribed to you. Take any new  Medicines after you have completely understood and accpet all the possible adverse reactions/side effects.   Do not drive when taking Pain medications.   Do not take more than prescribed Pain, Sleep  and Anxiety Medications  Special Instructions: If you have smoked or chewed Tobacco  in the last 2 yrs please stop smoking, stop any regular Alcohol  and or any Recreational drug use.  Wear Seat belts while driving.  Please note  You were cared for by a hospitalist during your hospital stay. Once you are discharged, your primary care physician will handle any further medical issues. Please note that NO REFILLS for any discharge medications will be authorized once you are discharged, as it is imperative that you return to your primary care physician (or establish a relationship with a primary care physician if you do not have one) for your aftercare needs so that they can reassess your need for medications and monitor your lab values.   Diet recommendation: Heart Healthy/Renal diet   Discharge Instructions    Call MD for:  persistant nausea and vomiting    Complete by:  As directed      Call MD for:  severe uncontrolled pain    Complete by:  As directed      Diet - low sodium heart healthy    Complete by:  As directed   Renal diet     Increase activity slowly    Complete by:  As directed            Follow-up Information    Follow up with Gilford Rile, MD. Schedule an appointment as soon as possible for a visit in 1 week.   Specialty:  Internal Medicine   Contact information:   [place] [address] [phone]       Please follow up.   Contact information:   Hemodialysis center-at  regular schedule      Follow up with KRASOWSKI,ROBERT, MD. Schedule an appointment as soon as possible for a visit in 1 week.   Specialty:  Cardiology   Contact information:   [place] [address] [phone]      Total Time spent on discharge equals  25  minutes.  SignedOren Binet 07/12/2015 10:20 AM

## 2015-07-12 NOTE — Discharge Instructions (Signed)
Information on my medicine - Coumadin   (Warfarin)  This medication education was reviewed with me or my healthcare representative as part of my discharge preparation.  The pharmacist that spoke with me during my hospital stay was:  Pat Patrick, [facility]  Why was Coumadin prescribed for you? Coumadin was prescribed for you because you have a blood clot or a medical condition that can cause an increased risk of forming blood clots. Blood clots can cause serious health problems by blocking the flow of blood to the heart, lung, or brain. Coumadin can prevent harmful blood clots from forming. As a reminder your indication for Coumadin is:   Pulmonary Embolism Treatment  What test will check on my response to Coumadin? While on Coumadin (warfarin) you will need to have an INR test regularly to ensure that your dose is keeping you in the desired range. The INR (international normalized ratio) number is calculated from the result of the laboratory test called prothrombin time (PT).  If an INR APPOINTMENT HAS NOT ALREADY BEEN MADE FOR YOU please schedule an appointment to have this lab work done by your health care provider within 7 days. Your INR goal is usually a number between:  2 to 3 or your provider may give you a more narrow range like 2-2.5.  Ask your health care provider during an office visit what your goal INR is.  What  do you need to  know  About  COUMADIN? Take Coumadin (warfarin) exactly as prescribed by your healthcare provider about the same time each day.  DO NOT stop taking without talking to the doctor who prescribed the medication.  Stopping without other blood clot prevention medication to take the place of Coumadin may increase your risk of developing a new clot or stroke.  Get refills before you run out.  What do you do if you miss a dose? If you miss a dose, take it as soon as you remember on the same day then continue your regularly scheduled regimen the next day.  Do not take  two doses of Coumadin at the same time.  Important Safety Information A possible side effect of Coumadin (Warfarin) is an increased risk of bleeding. You should call your healthcare provider right away if you experience any of the following: ? Bleeding from an injury or your nose that does not stop. ? Unusual colored urine (red or dark brown) or unusual colored stools (red or black). ? Unusual bruising for unknown reasons. ? A serious fall or if you hit your head (even if there is no bleeding).  Some foods or medicines interact with Coumadin (warfarin) and might alter your response to warfarin. To help avoid this: ? Eat a balanced diet, maintaining a consistent amount of Vitamin K. ? Notify your provider about major diet changes you plan to make. ? Avoid alcohol or limit your intake to 1 drink for women and 2 drinks for men per day. (1 drink is 5 oz. wine, 12 oz. beer, or 1.5 oz. liquor.)  Make sure that ANY health care provider who prescribes medication for you knows that you are taking Coumadin (warfarin).  Also make sure the healthcare provider who is monitoring your Coumadin knows when you have started a new medication including herbals and non-prescription products.  Coumadin (Warfarin)  Major Drug Interactions  Increased Warfarin Effect Decreased Warfarin Effect  Alcohol (large quantities) Antibiotics (esp. Septra/Bactrim, Flagyl, Cipro) Amiodarone (Cordarone) Aspirin (ASA) Cimetidine (Tagamet) Megestrol (Megace) NSAIDs (ibuprofen, naproxen, etc.) Piroxicam (  Feldene) °Propafenone (Rythmol SR) °Propranolol (Inderal) °Isoniazid (INH) °Posaconazole (Noxafil) Barbiturates (Phenobarbital) °Carbamazepine (Tegretol) °Chlordiazepoxide (Librium) °Cholestyramine (Questran) °Griseofulvin °Oral Contraceptives °Rifampin °Sucralfate (Carafate) °Vitamin K  ° °Coumadin® (Warfarin) Major Herbal Interactions  °Increased Warfarin Effect Decreased Warfarin Effect  °Garlic °Ginseng °Ginkgo biloba  Coenzyme Q10 °Green tea °St. John’s wort   ° °Coumadin® (Warfarin) FOOD Interactions  °Eat a consistent number of servings per week of foods HIGH in Vitamin K °(1 serving = ½ cup)  °Collards (cooked, or boiled & drained) °Kale (cooked, or boiled & drained) °Mustard greens (cooked, or boiled & drained) °Parsley *serving size only = ¼ cup °Spinach (cooked, or boiled & drained) °Swiss chard (cooked, or boiled & drained) °Turnip greens (cooked, or boiled & drained)  °Eat a consistent number of servings per week of foods MEDIUM-HIGH in Vitamin K °(1 serving = 1 cup)  °Asparagus (cooked, or boiled & drained) °Broccoli (cooked, boiled & drained, or raw & chopped) °Brussel sprouts (cooked, or boiled & drained) *serving size only = ½ cup °Lettuce, raw (green leaf, endive, romaine) °Spinach, raw °Turnip greens, raw & chopped  ° °These websites have more information on Coumadin (warfarin):  www.coumadin.com; °www.ahrq.gov/consumer/coumadin.htm; ° ° °

## 2015-07-12 NOTE — Progress Notes (Signed)
ANTICOAGULATION CONSULT NOTE - Follow Up Consult  Pharmacy Consult for Coumadin Indication: pulmonary embolus  No Known Allergies  Patient Measurements: Height: 5\' 2"  (157.5 cm) Weight: 131 lb 1.6 oz (59.467 kg) IBW/kg (Calculated) : 50.1 Heparin Dosing Weight: n/a  Vital Signs: Temp: 98.7 F (37.1 C) (11/11 0500) Temp Source: Oral (11/11 0500) BP: 116/46 mmHg (11/11 0500) Pulse Rate: 84 (11/11 0925)  Labs:  Recent Labs  07/11/15 0940 07/11/15 1503 07/11/15 1800 07/11/15 2337 07/12/15 0541  HGB 12.3  --   --   --   --   HCT 37.5  --   --   --   --   PLT 218  --   --   --   --   LABPROT 29.6*  --   --   --  27.3*  INR 2.86*  --   --   --  2.58*  CREATININE 7.46*  --   --   --   --   TROPONINI  --  0.07* 0.07* 0.07*  --     Estimated Creatinine Clearance: 4.4 mL/min (by C-G formula based on Cr of 7.46).   Assessment: 81 yo female admitted with chest pain.  On chronic Coumadin PTA for hx PE.  INR therapeutic at admission.  I believe her current home dose is 5 mg daily.  I attempted to verify this with the patient but she is unsure.  Goal of Therapy:  INR 2-3 Monitor platelets by anticoagulation protocol: Yes   Plan:  1. Coumadin 5 mg daily for now.  Advise f/u with outpatient INR soon.  Uvaldo Rising, BCPS  Clinical Pharmacist Pager [phone]  07/12/2015 11:01 AM

## 2015-07-12 NOTE — Progress Notes (Signed)
Patient ID: Annette Roach, female   DOB: 09-24-1927, 81 y.o.   MRN: [id number]  [facility] Progress Note   Assessment/ Plan:   1. Chest pain - recurrence of chest pain overnight that was not improved by sublingual nitroglycerin and somewhat improved by oxycodone. Cardiac enzymes cycled without notable elevation and no acute changes noted on EKG from last night. Given limited potential for intervention-medical management recommended with outpatient cardiology follow-up in [place]. 2. ESRD - underwent hemodialysis yesterday per her usual outpatient schedule, next dialysis tomorrow (will put in orders in case she is still here). 3. Hypertension/volume - blood pressures well controlled at this time without any edema on physical exam-continue to follow closely  4. Anemia - hemoglobin within acceptable limits, no overt losses 5. Metabolic bone disease - continue Sensipar/phosphorus binders as well as calcitriol at hemodialysis 6. Nutrition - renal diet + vitamin; unfortunately with weight loss/poor appetite requiring decreased dry weight over the past several months 7. 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 Hx PE - on coumadin, INR therapeutic 9. DNR -signed 12/2014 Dr. Justin Mend  Subjective:   Reports to be still having some chest pain that has shifted from substernal position to the left side of her chest. She denies any shortness of breath or diaphoresis.    Objective:   BP 116/46 mmHg  Pulse 82  Temp(Src) 98.7 F (37.1 C) (Oral)  Resp 16  Ht 5\' 2"  (1.575 m)  Wt 59.467 kg (131 lb 1.6 oz)  BMI 23.97 kg/m2  SpO2 97%  Physical Exam: Gen: Appears to be comfortable resting in bed CVS: Pulse regular in rate and rhythm, S1 and S2 normal Resp: Clear to auscultation-poor inspiratory effort Abd: Soft, obese, nontender Ext: No lower extremity edema  Labs: BMET  Recent Labs Lab 07/11/15 0940  NA 137  K 4.2  CL 97*  CO2 25  GLUCOSE 102*  BUN 39*  CREATININE  7.46*  CALCIUM 7.8*   CBC  Recent Labs Lab 07/11/15 0940  WBC 7.2  HGB 12.3  HCT 37.5  MCV 88.0  PLT 218   Medications:    . azelastine  1 spray Each Nare Daily  . budesonide (PULMICORT) nebulizer solution  0.25 mg Nebulization BID  . [START ON 07/13/2015] calcitRIOL  2.25 mcg Oral Q T,Th,Sa-HD  . calcium acetate  667-1,334 mg Oral TID WC  . cinacalcet  30 mg Oral Daily  . hydrALAZINE  10 mg Oral BID  . imatinib  200 mg Oral Daily  . metoprolol succinate  25 mg Oral Daily  . mirtazapine  15 mg Oral QHS  . multivitamin  1 tablet Oral Daily  . pantoprazole  40 mg Oral Daily  . penicillin v potassium  500 mg Oral QID  . polyethylene glycol  17 g Oral Daily   Elmarie Shiley, MD 07/12/2015, 8:48 AM

## 2015-07-12 NOTE — Progress Notes (Signed)
Pt discharged home with daughter.  Reviewed discharge instructions and education, all questions answered.  Assessment unchanged from earlier.  

## 2015-07-12 NOTE — Progress Notes (Signed)
    Subjective:  Continues with CP (increased with palpation and inspiration); no dyspnea.   Objective:  Filed Vitals:   07/11/15 2032 07/11/15 2043 07/12/15 0500 07/12/15 0925  BP:  127/38 116/46   Pulse:   82 84  Temp:   98.7 F (37.1 C)   TempSrc:   Oral   Resp:   16   Height:      Weight:   59.467 kg (131 lb 1.6 oz)   SpO2: 95%  97%     Intake/Output from previous day:  Intake/Output Summary (Last 24 hours) at 07/12/15 1011 Last data filed at 07/12/15 0900  Gross per 24 hour  Intake    720 ml  Output    500 ml  Net    220 ml    Physical Exam: Physical exam: Well-developed well-nourished in no acute distress.  Skin is warm and dry.  HEENT is normal.  Neck is supple.  Chest is clear to auscultation with normal expansion.  Cardiovascular exam is regular rate and rhythm.  Abdominal exam nontender or distended. No masses palpated. Extremities show no edema. neuro grossly intact    Lab Results: Basic Metabolic Panel:  Recent Labs  07/11/15 0940  NA 137  K 4.2  CL 97*  CO2 25  GLUCOSE 102*  BUN 39*  CREATININE 7.46*  CALCIUM 7.8*   CBC:  Recent Labs  07/11/15 0940  WBC 7.2  HGB 12.3  HCT 37.5  MCV 88.0  PLT 218   Cardiac Enzymes:  Recent Labs  07/11/15 1503 07/11/15 1800 07/11/15 2337  TROPONINI 0.07* 0.07* 0.07*     Assessment/Plan:  1 chest pain-patient continues with chest pain this morning that is reproduced with palpation and inspiration. Probable musculoskeletal. Troponin minimally elevated likely secondary to renal insufficiency. No clear trend up and not consistent with an acute coronary syndrome. The patient does not want further cardiac testing as outlined in my consult note yesterday. Plan medical therapy and follow-up with her cardiologist in [place]. 2 end-stage renal disease-management per nephrology. 3 history of pulmonary embolus-resume Coumadin at discharge. We will sign off. Please call with questions. Kirk Ruths 07/12/2015, 10:11 AM

## 2015-07-29 DIAGNOSIS — R911 Solitary pulmonary nodule: Secondary | ICD-10-CM

## 2015-07-29 DIAGNOSIS — I2699 Other pulmonary embolism without acute cor pulmonale: Secondary | ICD-10-CM

## 2015-07-29 DIAGNOSIS — R1012 Left upper quadrant pain: Secondary | ICD-10-CM | POA: Diagnosis not present

## 2015-07-29 DIAGNOSIS — C49A2 Gastrointestinal stromal tumor of stomach: Secondary | ICD-10-CM | POA: Diagnosis not present

## 2015-11-15 DIAGNOSIS — C169 Malignant neoplasm of stomach, unspecified: Secondary | ICD-10-CM | POA: Diagnosis not present

## 2015-12-09 ENCOUNTER — Emergency Department (HOSPITAL_COMMUNITY): Payer: Medicare Other

## 2015-12-09 ENCOUNTER — Inpatient Hospital Stay (HOSPITAL_COMMUNITY)
Admission: EM | Admit: 2015-12-09 | Discharge: 2015-12-13 | DRG: 299 | Disposition: A | Discharge status: Hospice | Payer: Medicare Other | Attending: Internal Medicine | Admitting: Internal Medicine

## 2015-12-09 ENCOUNTER — Encounter (HOSPITAL_COMMUNITY): Payer: Self-pay | Admitting: *Deleted

## 2015-12-09 DIAGNOSIS — Z515 Encounter for palliative care: Secondary | ICD-10-CM | POA: Diagnosis not present

## 2015-12-09 DIAGNOSIS — E875 Hyperkalemia: Secondary | ICD-10-CM | POA: Diagnosis not present

## 2015-12-09 DIAGNOSIS — I132 Hypertensive heart and chronic kidney disease with heart failure and with stage 5 chronic kidney disease, or end stage renal disease: Secondary | ICD-10-CM | POA: Diagnosis present

## 2015-12-09 DIAGNOSIS — I998 Other disorder of circulatory system: Secondary | ICD-10-CM

## 2015-12-09 DIAGNOSIS — Z8249 Family history of ischemic heart disease and other diseases of the circulatory system: Secondary | ICD-10-CM

## 2015-12-09 DIAGNOSIS — L89159 Pressure ulcer of sacral region, unspecified stage: Secondary | ICD-10-CM | POA: Diagnosis present

## 2015-12-09 DIAGNOSIS — Z66 Do not resuscitate: Secondary | ICD-10-CM | POA: Diagnosis present

## 2015-12-09 DIAGNOSIS — E1140 Type 2 diabetes mellitus with diabetic neuropathy, unspecified: Secondary | ICD-10-CM | POA: Diagnosis present

## 2015-12-09 DIAGNOSIS — E785 Hyperlipidemia, unspecified: Secondary | ICD-10-CM | POA: Diagnosis present

## 2015-12-09 DIAGNOSIS — R4182 Altered mental status, unspecified: Secondary | ICD-10-CM | POA: Diagnosis present

## 2015-12-09 DIAGNOSIS — I739 Peripheral vascular disease, unspecified: Secondary | ICD-10-CM

## 2015-12-09 DIAGNOSIS — I5022 Chronic systolic (congestive) heart failure: Secondary | ICD-10-CM | POA: Diagnosis present

## 2015-12-09 DIAGNOSIS — K219 Gastro-esophageal reflux disease without esophagitis: Secondary | ICD-10-CM | POA: Diagnosis present

## 2015-12-09 DIAGNOSIS — E1152 Type 2 diabetes mellitus with diabetic peripheral angiopathy with gangrene: Secondary | ICD-10-CM | POA: Diagnosis present

## 2015-12-09 DIAGNOSIS — R410 Disorientation, unspecified: Secondary | ICD-10-CM

## 2015-12-09 DIAGNOSIS — I2510 Atherosclerotic heart disease of native coronary artery without angina pectoris: Secondary | ICD-10-CM | POA: Diagnosis present

## 2015-12-09 DIAGNOSIS — I252 Old myocardial infarction: Secondary | ICD-10-CM | POA: Diagnosis not present

## 2015-12-09 DIAGNOSIS — L89329 Pressure ulcer of left buttock, unspecified stage: Secondary | ICD-10-CM | POA: Diagnosis present

## 2015-12-09 DIAGNOSIS — C49A2 Gastrointestinal stromal tumor of stomach: Secondary | ICD-10-CM | POA: Diagnosis present

## 2015-12-09 DIAGNOSIS — I96 Gangrene, not elsewhere classified: Secondary | ICD-10-CM

## 2015-12-09 DIAGNOSIS — I4891 Unspecified atrial fibrillation: Secondary | ICD-10-CM | POA: Diagnosis not present

## 2015-12-09 DIAGNOSIS — Z885 Allergy status to narcotic agent status: Secondary | ICD-10-CM | POA: Diagnosis not present

## 2015-12-09 DIAGNOSIS — L89319 Pressure ulcer of right buttock, unspecified stage: Secondary | ICD-10-CM | POA: Diagnosis present

## 2015-12-09 DIAGNOSIS — M79606 Pain in leg, unspecified: Secondary | ICD-10-CM | POA: Insufficient documentation

## 2015-12-09 DIAGNOSIS — F329 Major depressive disorder, single episode, unspecified: Secondary | ICD-10-CM | POA: Diagnosis present

## 2015-12-09 DIAGNOSIS — J449 Chronic obstructive pulmonary disease, unspecified: Secondary | ICD-10-CM | POA: Diagnosis present

## 2015-12-09 DIAGNOSIS — Z992 Dependence on renal dialysis: Secondary | ICD-10-CM | POA: Diagnosis not present

## 2015-12-09 DIAGNOSIS — N186 End stage renal disease: Secondary | ICD-10-CM | POA: Diagnosis present

## 2015-12-09 DIAGNOSIS — D631 Anemia in chronic kidney disease: Secondary | ICD-10-CM | POA: Diagnosis present

## 2015-12-09 DIAGNOSIS — Z833 Family history of diabetes mellitus: Secondary | ICD-10-CM | POA: Diagnosis not present

## 2015-12-09 DIAGNOSIS — E213 Hyperparathyroidism, unspecified: Secondary | ICD-10-CM | POA: Diagnosis present

## 2015-12-09 DIAGNOSIS — I248 Other forms of acute ischemic heart disease: Secondary | ICD-10-CM | POA: Diagnosis not present

## 2015-12-09 DIAGNOSIS — E1122 Type 2 diabetes mellitus with diabetic chronic kidney disease: Secondary | ICD-10-CM | POA: Diagnosis present

## 2015-12-09 DIAGNOSIS — L8990 Pressure ulcer of unspecified site, unspecified stage: Secondary | ICD-10-CM | POA: Insufficient documentation

## 2015-12-09 LAB — I-STAT VENOUS BLOOD GAS, ED
Acid-Base Excess: 2 mmol/L (ref 0.0–2.0)
Bicarbonate: 26.9 mEq/L — ABNORMAL HIGH (ref 20.0–24.0)
O2 Saturation: 73 %
PH VEN: 7.433 — AB (ref 7.250–7.300)
TCO2: 28 mmol/L (ref 0–100)
pCO2, Ven: 39.9 mmHg — ABNORMAL LOW (ref 45.0–50.0)
pO2, Ven: 36 mmHg (ref 31.0–45.0)

## 2015-12-09 LAB — CBC
HEMATOCRIT: 25 % — AB (ref 36.0–46.0)
HEMOGLOBIN: 8.1 g/dL — AB (ref 12.0–15.0)
MCH: 26.8 pg (ref 26.0–34.0)
MCHC: 32.4 g/dL (ref 30.0–36.0)
MCV: 82.8 fL (ref 78.0–100.0)
Platelets: 199 10*3/uL (ref 150–400)
RBC: 3.02 MIL/uL — ABNORMAL LOW (ref 3.87–5.11)
RDW: 21.2 % — AB (ref 11.5–15.5)
WBC: 9.4 10*3/uL (ref 4.0–10.5)

## 2015-12-09 LAB — CBG MONITORING, ED
GLUCOSE-CAPILLARY: 99 mg/dL (ref 65–99)
Glucose-Capillary: 117 mg/dL — ABNORMAL HIGH (ref 65–99)

## 2015-12-09 LAB — HEPATIC FUNCTION PANEL
ALBUMIN: 2.4 g/dL — AB (ref 3.5–5.0)
ALT: 27 U/L (ref 14–54)
AST: 107 U/L — AB (ref 15–41)
Alkaline Phosphatase: 61 U/L (ref 38–126)
BILIRUBIN INDIRECT: 0.6 mg/dL (ref 0.3–0.9)
Bilirubin, Direct: 0.3 mg/dL (ref 0.1–0.5)
TOTAL PROTEIN: 7.5 g/dL (ref 6.5–8.1)
Total Bilirubin: 0.9 mg/dL (ref 0.3–1.2)

## 2015-12-09 LAB — GLUCOSE, CAPILLARY: GLUCOSE-CAPILLARY: 100 mg/dL — AB (ref 65–99)

## 2015-12-09 LAB — TSH: TSH: 5.307 u[IU]/mL — ABNORMAL HIGH (ref 0.350–4.500)

## 2015-12-09 LAB — TROPONIN I: Troponin I: 0.56 ng/mL (ref <0.031)

## 2015-12-09 LAB — BASIC METABOLIC PANEL
ANION GAP: 16 — AB (ref 5–15)
BUN: 31 mg/dL — AB (ref 6–20)
CALCIUM: 8.6 mg/dL — AB (ref 8.9–10.3)
CO2: 21 mmol/L — AB (ref 22–32)
Chloride: 99 mmol/L — ABNORMAL LOW (ref 101–111)
Creatinine, Ser: 6.29 mg/dL — ABNORMAL HIGH (ref 0.44–1.00)
GFR calc Af Amer: 6 mL/min — ABNORMAL LOW (ref >60)
GFR, EST NON AFRICAN AMERICAN: 5 mL/min — AB (ref >60)
GLUCOSE: 140 mg/dL — AB (ref 65–99)
POTASSIUM: 4.9 mmol/L (ref 3.5–5.1)
Sodium: 136 mmol/L (ref 135–145)

## 2015-12-09 LAB — MRSA PCR SCREENING: MRSA BY PCR: NEGATIVE

## 2015-12-09 MED ORDER — SODIUM CHLORIDE 0.9 % IV SOLN
Freq: Once | INTRAVENOUS | Status: AC
  Administered 2015-12-09: 15:00:00 via INTRAVENOUS

## 2015-12-09 MED ORDER — SODIUM CHLORIDE 0.9% FLUSH
3.0000 mL | Freq: Two times a day (BID) | INTRAVENOUS | Status: DC
  Administered 2015-12-09 – 2015-12-13 (×5): 3 mL via INTRAVENOUS

## 2015-12-09 MED ORDER — SODIUM CHLORIDE 0.9 % IV SOLN: Freq: Once | INTRAVENOUS | Status: DC

## 2015-12-09 MED ORDER — HEPARIN SODIUM (PORCINE) 5000 UNIT/ML IJ SOLN
5000.0000 [IU] | Freq: Three times a day (TID) | INTRAMUSCULAR | Status: DC
  Administered 2015-12-09 – 2015-12-11 (×4): 5000 [IU] via SUBCUTANEOUS
  Filled 2015-12-09 (×3): qty 1

## 2015-12-09 NOTE — Progress Notes (Signed)
CRITICAL VALUE ALERT  Critical value received: Troponin 0.56  Date of notification:  12/09/2015  Time of notification:  2000  Critical value read back: YES  Nurse who received alert:  Casimiro Needle  MD notified (1st page):  Merrilyn Puma, MD (Internal Medicine)  Time of first page:  2008  MD notified (2nd page):  Time of second page:  Responding MD:  Merrilyn Puma  Time MD responded:  2020

## 2015-12-09 NOTE — Consult Note (Addendum)
Vascular and Vein Specialist of [facility]  Patient name: Annette Roach MRN: [id number] DOB: [birth year] Sex: female  REASON FOR CONSULT: left foot gangrene. Consult is from ED provider, Dr Venora Maples  HPI: Annette Roach is a 82 y.o. female, who presents to the [facility] ED with one month history of ischemic changes to the left foot, with worsening changes one week ago. Her daughter is at the bedside and provides the history. The patient is unable to provide a history as she is somnolent and only oriented to person. The daughter reports that her mother has had an altered mental status for the past two days. The patient is s/p left peroneal and proximal left posterior tibial artery angioplasties by Dr. Quillian Quince at [facility] on 12/02/15.   The patient lives at home with her daughter. She was previously ambulating with a walker one week ago. She is known to our practice from having undergone previous dialysis accesses. She has a past medical history of ESRD on HD (TTS) via a right IJ tunneled dialysis catheter. It was reported that she had a fever at dialysis this past Saturday. She has diabetes not currently on medications. She has hypertension on metoprolol. She has a history of COPD.   Past Medical History  Diagnosis Date  . COPD (chronic obstructive pulmonary disease) (HCC)     emphysema  . Neuropathy ([place])   . Hyperlipidemia   . Diabetes mellitus   . Depression   . Anemia   . Shortness of breath   . GERD (gastroesophageal reflux disease)   . Hyperparathyroidism ([place])   . Hypertension     sees Dr. Gilford Rile  . Headache(784.0)   . Arthritis   . CAD (coronary artery disease)     sees Dr. Jenne Campus, [facility], [place]  . Steal syndrome of hand ([place]) jan- 2014    left hand  . Wears glasses   . Wears hearing aid     right  . Presence of surgically created AV shunt for hemodialysis [facility])     old lt upper arm out-rt upper arm shunt in  . Anginal pain ([place])     Dr Raliegh Ip  in [place], [place]  . History of kidney stones   . Constipation   . History of blood transfusion   . Asthma     years ago  . Chronic kidney disease     TTH SAT [place], [place]- Hemo  . Renal insufficiency     patient on dialysis for 3 years (today is 03/22/15  . Pneumonia 03/2015    Family History  Problem Relation Age of Onset  . Diabetes Mother   . Cancer Father   . Deep vein thrombosis Brother   . Hypertension Brother     SOCIAL HISTORY: Social History   Social History  . Marital Status: Married    Spouse Name: N/A  . Number of Children: N/A  . Years of Education: N/A   Occupational History  . Not on file.   Social History Main Topics  . Smoking status: Never Smoker   . Smokeless tobacco: Never Used  . Alcohol Use: No  . Drug Use: No  . Sexual Activity: Not on file   Other Topics Concern  . Not on file   Social History Narrative    Allergies  Allergen Reactions  . Oxycodone-Acetaminophen Other (See Comments)    confusion    No current facility-administered medications for this encounter.   Current Outpatient Prescriptions  Medication Sig Dispense Refill  . acetaminophen (TYLENOL) 325 MG tablet Take 2 tablets (650 mg total) by mouth every 6 (six) hours as needed for mild pain or headache.    Marland Kitchen aspirin EC 81 MG tablet Take 81 mg by mouth daily.    . Azelastine HCl 0.15 % SOLN Place 1 spray into both nostrils daily.   3  . calcium acetate (PHOSLO) 667 MG capsule Take 667-1,334 mg by mouth 3 (three) times daily with meals. 1 capsule (667mg ) with breakfast and lunch, 2 capsules (1334mg ) with dinner    . cinacalcet (SENSIPAR) 30 MG tablet Take 30 mg by mouth daily.    . clopidogrel (PLAVIX) 75 MG tablet Take 75 mg by mouth daily.    . hydrALAZINE (APRESOLINE) 10 MG tablet Take 10 mg by mouth 2 (two) times daily.    Marland Kitchen imatinib (GLEEVEC) 100 MG tablet Take 100 mg by mouth 2 (two) times daily.     . metoprolol succinate (TOPROL-XL) 25 MG 24 hr tablet Take 25  mg by mouth daily.    . mirtazapine (REMERON) 15 MG tablet Take 7.5 mg by mouth at bedtime.     . multivitamin (RENA-VIT) TABS tablet Take 1 tablet by mouth daily.    . nitroGLYCERIN (NITROSTAT) 0.4 MG SL tablet Place 0.4 mg under the tongue every 5 (five) minutes as needed for chest pain.    Marland Kitchen ondansetron (ZOFRAN) 4 MG tablet Take 4 mg by mouth every 8 (eight) hours as needed for nausea or vomiting.    Marland Kitchen oxyCODONE (OXY IR/ROXICODONE) 5 MG immediate release tablet Take 1 tablet (5 mg total) by mouth every 6 (six) hours as needed for severe pain. 20 tablet 0  . pantoprazole (PROTONIX) 40 MG tablet Take 40 mg by mouth daily.    . polyethylene glycol (MIRALAX / GLYCOLAX) packet Take 17 g by mouth daily.    . prochlorperazine (COMPAZINE) 10 MG tablet TAKE 1 TABLET EVERY 6 HOURS AS NEEDED FOR CHEMO NAUSEA-MAY TAKE AROUND THE CLOCK FOR PERSISTENT SYMP  2  . sennosides-docusate sodium (SENOKOT-S) 8.6-50 MG tablet Take 1 tablet by mouth 2 (two) times daily as needed for constipation.    . VENTOLIN HFA 108 (90 BASE) MCG/ACT inhaler Inhale 2 puffs into the lungs every 4 (four) hours as needed for wheezing or shortness of breath.   3    REVIEW OF SYSTEMS:  [X]  denotes positive finding, [ ]  denotes negative finding *Unable to obtain ROS given altered mental status  Cardiac  Comments:  Chest pain or chest pressure:    Shortness of breath upon exertion:    Short of breath when lying flat:    Irregular heart rhythm:        Vascular    Pain in calf, thigh, or hip brought on by ambulation:    Pain in feet at night that wakes you up from your sleep:     Blood clot in your veins:    Leg swelling:         Pulmonary    Oxygen at home:    Productive cough:     Wheezing:         Neurologic    Sudden weakness in arms or legs:     Sudden numbness in arms or legs:     Sudden onset of difficulty speaking or slurred speech:    Temporary loss of vision in one eye:     Problems with dizziness:  Gastrointestinal    Blood in stool:     Vomited blood:         Genitourinary    Burning when urinating:     Blood in urine:        Psychiatric    Major depression:         Hematologic    Bleeding problems:    Problems with blood clotting too easily:        Skin    Rashes or ulcers:        Constitutional    Fever or chills:      PHYSICAL EXAM: Filed Vitals:   12/09/15 1015 12/09/15 1045 12/09/15 1100 12/09/15 1130  BP: 145/46 137/47 149/46 152/51  Pulse: 71 72 72 74  Temp:      TempSrc:      Resp: 20 20 20 20   SpO2: 97% 90% 90% 92%    GENERAL: The patient is somnolent and in no acute distress. Oriented to person only. The vital signs are documented above. CARDIAC: There is a regular rate and rhythm. Right IJ tunneled dialysis catheter.  VASCULAR: 2+ femoral pulses bilaterally. Non palpable popliteal or pedal pulses bilaterally. Dry gangrene of left 2nd and 4th toes with cyanotic discoloration of distal foot. No open wounds or drainage. Right foot without wounds or discoloration.  PULMONARY: Non labored respiratory effort.  MUSCULOSKELETAL: There are no major deformities.  NEUROLOGIC: Unable to move left toes. Unsure of sensory function given patient's AMS.  SKIN: There are no other ulcers or rashes noted.   MEDICAL ISSUES: Dry gangrene left foot Other active problems: altered mental status, diabetes mellitus, ESRD on HD (TTS), hypotension, Hx of PE (not on anticoagulation), lymphoma  The patient is s/p multiple recent percutaneous interventions to her tibial arteries at [facility]. She was recommended below the knee amputation. She has no further options for revascularization. The left foot does not appear infected. Recommend left below the knee amputation. Explained there may be a chance she may require more proximal amputation if below the knee amputation does not heal. The patient was ambulating with a walker at baseline (one week ago). Given her current altered mental  status, the patient was unable to provide an opinion regarding surgery. Recommend admission to the hospitalist service for management of medication problems.    Virgina Jock, PA-C [facility] [place]  History and exam details as above.  Pt not a candidate for left leg revascularization.  Discussed with pt daughter proceeding with left AKA on Wednesday 12/09/15.  Risks benefits possible complications discussed with daughter including but not limited to bleeding infection cardiac events pneumonia death.  Would prefer admission to medical service given her current altered mental status and other medical problems including her dialysis needs.  Ruta Hinds, MD [facility] [place] Office: [phone] Pager: [phone]

## 2015-12-09 NOTE — ED Notes (Signed)
Admitting Dr's at bedside.

## 2015-12-09 NOTE — H&P (Signed)
Date: 12/09/2015               Patient Name:  Annette Roach MRN: [id number]  DOB: Mar 01, 1928 Age / Sex: 82 y.o., female   PCP: Raina Mina, MD         Medical Service: Internal Medicine Teaching Service         Attending Physician: Dr. Bartholomew Crews, MD    First Contact: Wille Celeste Pager: [phone]  Second Contact: Dr. Arcelia Jew Pager: [phone]       After Hours (After 5p/  First Contact Pager: [phone]  weekends / holidays): Second Contact Pager: [phone]   Chief Complaint: Altered mental status, dry gangrene of left foot  History of Present Illness: Annette Roach is a 82yo woman with PMHx of CAD s/p previous inferior MI, ESRD on HD TThS schedule, HTN, type 2 DM, GIST tumor, peripheral vascular disease, and CHF (EF 45-50% per echo June 2016) who presents today with a 1 month history of ischemic changes to her left foot. History was obtained from the patient's daughter as the patient was altered. Daughter reports her foot has been worsening over the last week with her 1st and 4th toes becoming darker. She reports the foot is very cold to touch. She notes her mother has been wincing in pain due to the lack of blood flow to her foot. She notes her mother and her have been "bouncing around from hospital to hospital" to try to get help for her foot. Daughter also notes the patient became very confused over the weekend and not acting herself. She reports the patient is normally talkative and interactive, but has been sleeping a lot more the past few days. She notes the patient received some pain medications from when they were at [facility] about 1 week ago, but the patient's last dose of Oxycodone IR 10 mg was on Friday. Per records, the patient was at [facility] on 12/02/15 for evaluation of her ischemic foot and left peroneal and proximal left posterior tibial artery angioplasties were performed without successful revascularization. In fact, she has had numerous attempts at revascularization with no  success. Also per records, the patient has a history of confusion since Feb 2016 and there have been discussions of hospice but seems the patient/family has been hesitant.  Meds: No current facility-administered medications for this encounter.   Current Outpatient Prescriptions  Medication Sig Dispense Refill  . acetaminophen (TYLENOL) 325 MG tablet Take 2 tablets (650 mg total) by mouth every 6 (six) hours as needed for mild pain or headache.    Marland Kitchen aspirin EC 81 MG tablet Take 81 mg by mouth daily.    . Azelastine HCl 0.15 % SOLN Place 1 spray into both nostrils daily.   3  . calcium acetate (PHOSLO) 667 MG capsule Take 667-1,334 mg by mouth 3 (three) times daily with meals. 1 capsule (667mg ) with breakfast and lunch, 2 capsules (1334mg ) with dinner    . cinacalcet (SENSIPAR) 30 MG tablet Take 30 mg by mouth daily.    . clopidogrel (PLAVIX) 75 MG tablet Take 75 mg by mouth daily.    . hydrALAZINE (APRESOLINE) 10 MG tablet Take 10 mg by mouth 2 (two) times daily.    Marland Kitchen imatinib (GLEEVEC) 100 MG tablet Take 100 mg by mouth 2 (two) times daily.     . metoprolol succinate (TOPROL-XL) 25 MG 24 hr tablet Take 25 mg by mouth daily.    . mirtazapine (REMERON) 15 MG tablet Take  7.5 mg by mouth at bedtime.     . multivitamin (RENA-VIT) TABS tablet Take 1 tablet by mouth daily.    . nitroGLYCERIN (NITROSTAT) 0.4 MG SL tablet Place 0.4 mg under the tongue every 5 (five) minutes as needed for chest pain.    Marland Kitchen ondansetron (ZOFRAN) 4 MG tablet Take 4 mg by mouth every 8 (eight) hours as needed for nausea or vomiting.    Marland Kitchen oxyCODONE (OXY IR/ROXICODONE) 5 MG immediate release tablet Take 1 tablet (5 mg total) by mouth every 6 (six) hours as needed for severe pain. 20 tablet 0  . pantoprazole (PROTONIX) 40 MG tablet Take 40 mg by mouth daily.    . polyethylene glycol (MIRALAX / GLYCOLAX) packet Take 17 g by mouth daily.    . prochlorperazine (COMPAZINE) 10 MG tablet TAKE 1 TABLET EVERY 6 HOURS AS NEEDED FOR  CHEMO NAUSEA-MAY TAKE AROUND THE CLOCK FOR PERSISTENT SYMP  2  . sennosides-docusate sodium (SENOKOT-S) 8.6-50 MG tablet Take 1 tablet by mouth 2 (two) times daily as needed for constipation.    . VENTOLIN HFA 108 (90 BASE) MCG/ACT inhaler Inhale 2 puffs into the lungs every 4 (four) hours as needed for wheezing or shortness of breath.   3    Allergies: Allergies as of 12/09/2015 - Review Complete 12/09/2015  Allergen Reaction Noted  . Oxycodone-acetaminophen Other (See Comments) 12/09/2015   Past Medical History  Diagnosis Date  . COPD (chronic obstructive pulmonary disease) (HCC)     emphysema  . Neuropathy ([place])   . Hyperlipidemia   . Diabetes mellitus   . Depression   . Anemia   . Shortness of breath   . GERD (gastroesophageal reflux disease)   . Hyperparathyroidism ([place])   . Hypertension     sees Dr. Gilford Rile  . Headache(784.0)   . Arthritis   . CAD (coronary artery disease)     sees Dr. Jenne Campus, [facility], [place]  . Steal syndrome of hand ([place]) jan- 2014    left hand  . Wears glasses   . Wears hearing aid     right  . Presence of surgically created AV shunt for hemodialysis [facility])     old lt upper arm out-rt upper arm shunt in  . Anginal pain ([place])     Dr Raliegh Ip in [place], [place]  . History of kidney stones   . Constipation   . History of blood transfusion   . Asthma     years ago  . Chronic kidney disease     TTH SAT [place], [place]- Hemo  . Renal insufficiency     patient on dialysis for 3 years (today is 03/22/15  . Pneumonia 03/2015   Past Surgical History  Procedure Laterality Date  . Abdominal hysterectomy  1976  . Kidney stone surgery  2013  . Av fistula placement  01/20/2012    Procedure: ARTERIOVENOUS (AV) FISTULA CREATION;  Surgeon: Elam Dutch, MD;  Location: [place];  Service: Vascular;  Laterality: Left;  . Hemodialysis catheter  05/25/12    secondary to failed AVF   . Eye surgery      bilateral cataract  removed  . Av fistula placement  09/05/2012    Procedure: INSERTION OF ARTERIOVENOUS (AV) GORE-TEX GRAFT ARM;  Surgeon: Elam Dutch, MD;  Location: [facility] OR;  Service: Vascular;  Laterality: Left;  Using 4-74mm x 45 cm Vascular stretch goretex graft  . Ligation of arteriovenous  fistula  09/05/2012  Procedure: LIGATION OF ARTERIOVENOUS  FISTULA;  Surgeon: Elam Dutch, MD;  Location: [place];  Service: Vascular;  Laterality: Left;  . Ligation arteriovenous gortex graft  09/05/2012    Procedure: LIGATION ARTERIOVENOUS GORTEX GRAFT;  Surgeon: Elam Dutch, MD;  Location: [place];  Service: Vascular;  Laterality: Left;  . Av fistula placement Right 11/07/2012    Procedure: ARTERIOVENOUS (AV) FISTULA CREATION;  Surgeon: Elam Dutch, MD;  Location: [facility] OR;  Service: Vascular;  Laterality: Right;  Bascilic Vein Fistula  . Appendectomy    . Carpal tunnel release Left 02/10/2013    Procedure: CARPAL TUNNEL RELEASE;  Surgeon: Cammie Sickle., MD;  Location: [place];  Service: Orthopedics;  Laterality: Left;  . Bascilic vein transposition Right 03/15/2013    Procedure: 2ND STAGE BASCILIC VEIN TRANSPOSITION - RIGHT ARM;  Surgeon: Elam Dutch, MD;  Location: [place];  Service: Vascular;  Laterality: Right;  . Av fistula placement Right 05/15/2013    Procedure: INSERTION OF ARTERIOVENOUS (AV) GORE-TEX GRAFT ARM-RIGHT;  Surgeon: Elam Dutch, MD;  Location: [place];  Service: Vascular;  Laterality: Right;  . Shuntogram N/A 07/27/2012    Procedure: Earney Mallet;  Surgeon: Rosetta Posner, MD;  Location: [facility] CATH LAB;  Service: Cardiovascular;  Laterality: N/A;   Family History  Problem Relation Age of Onset  . Diabetes Mother   . Cancer Father   . Deep vein thrombosis Brother   . Hypertension Brother    Social History   Social History  . Marital Status: Married    Spouse Name: N/A  . Number of Children: N/A  . Years of Education: N/A   Occupational History  . Not on file.    Social History Main Topics  . Smoking status: Never Smoker   . Smokeless tobacco: Never Used  . Alcohol Use: No  . Drug Use: No  . Sexual Activity: Not on file   Other Topics Concern  . Not on file   Social History Narrative    Review of Systems: Unable to complete due to AMS  Physical Exam: Blood pressure 151/50, pulse 71, temperature 97.8 F (36.6 C), temperature source Oral, resp. rate 22, SpO2 96 %. General: elderly woman resting in bed, lethargic, awakens to sternal rub and loud voice HEENT: [place]/AT, PERRL, mucus membranes moist CV: RRR, no m/g/r Pulm: CTA bilaterally, breaths non-labored Abd: BS+, soft, non-tender Ext: Unable to palpate distal pulses in left foot. Distal pulses 1+ in right foot. Left foot is cold to touch. Neuro: lethargic, minimally responsive to questions. Able to tell me her name after prompting her many times.  Skin:  Left 1st and 4th toes appear black with evidence of necrosis.       Lab results: Basic Metabolic Panel:  Recent Labs  12/09/15 0858  NA 136  K 4.9  CL 99*  CO2 21*  GLUCOSE 140*  BUN 31*  CREATININE 6.29*  CALCIUM 8.6*   CBC:  Recent Labs  12/09/15 0858  WBC 9.4  HGB 8.1*  HCT 25.0*  MCV 82.8  PLT 199   CBG:  Recent Labs  12/09/15 0817 12/09/15 1648  GLUCAP 117* 99   Imaging results:  Dg Chest 2 View  12/09/2015  CLINICAL DATA:  Altered mental status for 2 days.  Confusion EXAM: CHEST  2 VIEW COMPARISON:  07/11/2015 FINDINGS: Right dialysis catheter remains in place, unchanged. Cardiomegaly. There is a small right pleural effusion. Bibasilar airspace opacities are noted which could  represent edema or infection. Vascular congestion. No acute bony abnormality. IMPRESSION: Cardiomegaly. Bilateral lower lobe airspace opacities with vascular congestion. Findings could reflect edema or infection. Small right pleural effusion. Electronically Signed   By: Rolm Baptise M.D.   On: 12/09/2015 15:29   Ct Head Wo  Contrast  12/09/2015  CLINICAL DATA:  Confusion.  Altered mental status. EXAM: CT HEAD WITHOUT CONTRAST TECHNIQUE: Contiguous axial images were obtained from the base of the skull through the vertex without intravenous contrast. COMPARISON:  MRI 12/25/2014 FINDINGS: There is atrophy and chronic small vessel disease changes. No acute intracranial abnormality. Specifically, no hemorrhage, hydrocephalus, mass lesion, acute infarction, or significant intracranial injury. No acute calvarial abnormality. Extensive mucosal thickening throughout the paranasal sinuses. Polypoid soft tissue within the right nasal cavity. Mastoid air cells are clear. IMPRESSION: No acute intracranial abnormality. Atrophy, chronic microvascular disease. Chronic sinusitis, probable nasal polyposis. Electronically Signed   By: Rolm Baptise M.D.   On: 12/09/2015 15:50   Dg Foot Complete Left  12/09/2015  CLINICAL DATA:  Infection, black toes second third and fourth toe EXAM: LEFT FOOT - COMPLETE 3+ VIEW COMPARISON:  11/15/2015 FINDINGS: Three views of the left foot submitted. No acute fracture or subluxation. Vascular calcifications are again noted. There is diffuse osteopenia. No acute fracture or subluxation. Again noted mild narrowing of interphalangeal joints. Stable plantar and posterior spurring of calcaneus. No bony erosion or periosteal reaction. No definite evidence of bone destruction. IMPRESSION: No acute fracture or subluxation. Stable mild degenerative changes. Diffuse osteopenia. Atherosclerotic vascular calcifications. No periosteal reaction or bony erosion. Again noted plantar and posterior spurring of calcaneus. Electronically Signed   By: Lahoma Crocker M.D.   On: 12/09/2015 09:17    Other results: EKG: Sinus or ectopic atrial rhythm Prolonged PR interval Left atrial enlargement IVCD, consider atypical RBBB LVH with IVCD, LAD and secondary repol abnrm Inferior infarct, age indeterminate Prolonged QT interval-  575  Assessment & Plan by Problem:  Ms. Deross is a 82yo woman with PMHx of CAD s/p previous inferior MI, ESRD on HD TThS schedule, HTN, type 2 DM, GIST tumor, peripheral vascular disease, and CHF (EF 45-50% per echo June 2016) presenting with dry gangrene of her left foot and altered mental status.  Dry Gangrene of the Left Foot: She presented with a 1 month history of worsening ischemic changes in her left foot. Appears that she has undergone multiple angioplasties at [facility] in the past month in hopes to achieve revascularization of the foot without any success. The last attempt was on 12/02/15. Vascular surgery has evaluated her and recommending BKA of the left leg which family is agreeable to.  - Plan for BKA on Wed with vascular surgery - Vascular surgery following, appreciate recommendations - Will hold heparin 12 hours before surgery  AMS: Per daughter, she had an acute change in mental status but after reviewing prior records it is unclear of how much change she has had from her baseline. Multiple office visits with her PCP indicate that she has confusion at baseline. It is unclear to me how interactive she is normally, but per daughter it seems she is able to have conversations. There have been discussions of pursuing hospice, but does not seem patient/family are ready per records. CT head negative in the ED. Her electrolytes are within normal range. Respiratory unable to perform ABG. Will pursue some further work up to see if we can figure out a reason for her AMS/confusion other than deconditioning. - Check LFTs,  TSH, troponin - Keep NPO for now - If becomes agitated, try reorientation, sitter  ESRD on HD: On TThSat schedule. Last HD 12/07/15.  - Renal consulted, appreciate recommendations - HD tomorrow  - Hold Phoslo, Sensipar, Rena-vit with AMS  HTN: BP 148/50. She is on hydralazine 10 mg BID and toprol-XL 25 mg daily at home.  - Hold home PO meds - If SBP > 180 can give IV hydralazine    Type 2 DM: Last HbA1c 5.3 on 02/23/15. She is diet controlled.  - Will hold off on ISS since NPO and altered - CBG monitoring   CAD s/p MI: She is on ASA, Toprol-XL, and Plavix at home. - Hold home meds with AMS  Systolic CHF: Last echo 0000000 with EF 45-50%, moderate LVH, basal inferior and basal inferolateral akinesis, mid inferior and mid inferolateral hypokinesis. She does not appear volume overloaded. 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 Tumor not surgically resected likely secondary to her comorbid medical conditions. - Hold Gleevac given AMS  Diet: NPO for now given AMS VTE PPx: Heparin SQ Dispo: Disposition is deferred at this time, awaiting improvement of current medical problems. Anticipated discharge in approximately 2-4 day(s).   The patient does have a current PCP Raina Mina, MD) and does need an [facility] hospital follow-up appointment after discharge.  The patient does not have transportation limitations that hinder transportation to clinic appointments.  Signed: Juliet Rude, MD 12/09/2015, 5:33 PM

## 2015-12-09 NOTE — Progress Notes (Signed)
MD ordered ABG on patient, however venous sample was obtained (patient hard stick).  Results were as follows: pH: 7.43, CO2: 39.9, paO2: 38, HCO3: 26.9.  RN aware.  Will continue to monitor.

## 2015-12-09 NOTE — ED Notes (Signed)
IV team at bedside 

## 2015-12-09 NOTE — ED Notes (Signed)
Attempted report x1. 

## 2015-12-09 NOTE — ED Notes (Signed)
Admitting physician requesting CBG.   CBG 99.

## 2015-12-09 NOTE — ED Notes (Signed)
IV Team at bedside 

## 2015-12-09 NOTE — ED Notes (Signed)
Pt does not make urine. EDP aware. 

## 2015-12-09 NOTE — ED Notes (Signed)
This RN and Hope, RN attempted IV access x 2 w/o success.

## 2015-12-09 NOTE — ED Notes (Addendum)
Pt from home via [place], per report pt was seen [facility] previously for gangrene of the L foot 12/03/15, pt c/o L foot pain, pt alert to baseline per EMS, family reports generalized weakness & lethargy

## 2015-12-09 NOTE — ED Provider Notes (Signed)
CSN: [id number]     Arrival date & time 12/09/15  0753 History   First MD Initiated Contact with Patient 12/09/15 0757     Chief Complaint  Patient presents with  . Foot Pain   HPI   82 year old chronically ill patient presents today with tenderness left foot. Patient is unable to relay on pertinent information, her daughters at bedside and provided the majority of patient's history. She reports that over the last 3 weeks she's had worsening pain to her left foot with discoloration of the toes. She was initially seen by Dr. 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 He was able to restore flow to the peroneal artery but was unable to open either of the tibial arteries. She was sent to Dr. Brunetta Jeans at [facility] in [place], he was unable to open the tibial arteries. Patient returned to Dr. Quillian Quince who recommended that she follow up with her podiatrist to request advice regarding further management. Dr. Quillian Quince notes that a limited amputation would likely not provide success, patient likely needs a below the knee amputation. Patient was given pain medication 3 days prior, she notes that she gave her mother the medication which caused lethargy and nausea. She reports throughout the weekend she's had increasing pain to the left foot, post was follow up with podiatry today but due to pain was unable to follow-up.  She notes she is a dialysis patient, Tuesday Thursday Saturday with Dr. Justin Mend at [place]. She denies any signs of systemic infection  Past Medical History  Diagnosis Date  . COPD (chronic obstructive pulmonary disease) (HCC)     emphysema  . Neuropathy ([place])   . Hyperlipidemia   . Diabetes mellitus   . Depression   . Anemia   . Shortness of breath   . GERD (gastroesophageal reflux disease)   . Hyperparathyroidism ([place])   . Hypertension     sees Dr. Gilford Rile  .  Headache(784.0)   . Arthritis   . CAD (coronary artery disease)     sees Dr. Jenne Campus, [facility] cardiology [facility], [place]  . Steal syndrome of hand ([place]) jan- 2014    left hand  . Wears glasses   . Wears hearing aid     right  . Presence of surgically created AV shunt for hemodialysis [facility])     old lt upper arm out-rt upper arm shunt in  . Anginal pain ([place])     Dr Raliegh Ip in [place], [place]  . History of kidney stones   . Constipation   . History of blood transfusion   . Asthma     years ago  . Chronic kidney disease     [facility] , [place]- Hemo  . Renal insufficiency     patient on dialysis for 3 years (today is 03/22/15  . Pneumonia 03/2015   Past Surgical History  Procedure Laterality Date  . Abdominal hysterectomy  1976  . Kidney stone surgery  2013  . Av fistula placement  01/20/2012    Procedure: ARTERIOVENOUS (AV) FISTULA CREATION;  Surgeon: Elam Dutch, MD;  Location: [place];  Service: Vascular;  Laterality: Left;  . Hemodialysis catheter  05/25/12    secondary to failed AVF   . Eye surgery      bilateral cataract removed  . Av fistula placement  09/05/2012    Procedure: INSERTION OF ARTERIOVENOUS (AV) GORE-TEX GRAFT  ARM;  Surgeon: Elam Dutch, MD;  Location: [facility] OR;  Service: Vascular;  Laterality: Left;  Using 4-29mm x 45 cm Vascular stretch goretex graft  . Ligation of arteriovenous  fistula  09/05/2012    Procedure: LIGATION OF ARTERIOVENOUS  FISTULA;  Surgeon: Elam Dutch, MD;  Location: [place];  Service: Vascular;  Laterality: Left;  . Ligation arteriovenous gortex graft  09/05/2012    Procedure: LIGATION ARTERIOVENOUS GORTEX GRAFT;  Surgeon: Elam Dutch, MD;  Location: [place];  Service: Vascular;  Laterality: Left;  . Av fistula placement Right 11/07/2012    Procedure: ARTERIOVENOUS (AV) FISTULA CREATION;  Surgeon: Elam Dutch, MD;  Location: [facility] OR;  Service: Vascular;  Laterality: Right;  Bascilic Vein Fistula  . Appendectomy    .  Carpal tunnel release Left 02/10/2013    Procedure: CARPAL TUNNEL RELEASE;  Surgeon: Cammie Sickle., MD;  Location: [place];  Service: Orthopedics;  Laterality: Left;  . Bascilic vein transposition Right 03/15/2013    Procedure: 2ND STAGE BASCILIC VEIN TRANSPOSITION - RIGHT ARM;  Surgeon: Elam Dutch, MD;  Location: [place];  Service: Vascular;  Laterality: Right;  . Av fistula placement Right 05/15/2013    Procedure: INSERTION OF ARTERIOVENOUS (AV) GORE-TEX GRAFT ARM-RIGHT;  Surgeon: Elam Dutch, MD;  Location: [place];  Service: Vascular;  Laterality: Right;  . Shuntogram N/A 07/27/2012    Procedure: Earney Mallet;  Surgeon: Rosetta Posner, MD;  Location: [facility] CATH LAB;  Service: Cardiovascular;  Laterality: N/A;   Family History  Problem Relation Age of Onset  . Diabetes Mother   . Cancer Father   . Deep vein thrombosis Brother   . Hypertension Brother    Social History  Substance Use Topics  . Smoking status: Never Smoker   . Smokeless tobacco: Never Used  . Alcohol Use: No   OB History    No data available     Review of Systems  All other systems reviewed and are negative.   Allergies  Oxycodone-acetaminophen  Home Medications   Prior to Admission medications   Medication Sig Start Date End Date Taking? Authorizing Provider  acetaminophen (TYLENOL) 325 MG tablet Take 2 tablets (650 mg total) by mouth every 6 (six) hours as needed for mild pain or headache. 07/12/15  Yes Shanker Kristeen Mans, MD  aspirin EC 81 MG tablet Take 81 mg by mouth daily.   Yes Historical Provider, MD  Azelastine HCl 0.15 % SOLN Place 1 spray into both nostrils daily.  10/10/14  Yes Historical Provider, MD  calcium acetate (PHOSLO) 667 MG capsule Take 667-1,334 mg by mouth 3 (three) times daily with meals. 1 capsule (667mg ) with breakfast and lunch, 2 capsules (1334mg ) with dinner   Yes Historical Provider, MD  cinacalcet (SENSIPAR) 30 MG tablet Take 30 mg by mouth daily.   Yes  Historical Provider, MD  clopidogrel (PLAVIX) 75 MG tablet Take 75 mg by mouth daily.   Yes Historical Provider, MD  hydrALAZINE (APRESOLINE) 10 MG tablet Take 10 mg by mouth 2 (two) times daily.   Yes Historical Provider, MD  imatinib (GLEEVEC) 100 MG tablet Take 100 mg by mouth 2 (two) times daily.  02/04/15  Yes Historical Provider, MD  metoprolol succinate (TOPROL-XL) 25 MG 24 hr tablet Take 25 mg by mouth daily.   Yes Historical Provider, MD  mirtazapine (REMERON) 15 MG tablet Take 7.5 mg by mouth at bedtime.    Yes Historical Provider, MD  multivitamin (  RENA-VIT) TABS tablet Take 1 tablet by mouth daily.   Yes Historical Provider, MD  nitroGLYCERIN (NITROSTAT) 0.4 MG SL tablet Place 0.4 mg under the tongue every 5 (five) minutes as needed for chest pain.   Yes Historical Provider, MD  ondansetron (ZOFRAN) 4 MG tablet Take 4 mg by mouth every 8 (eight) hours as needed for nausea or vomiting.   Yes Historical Provider, MD  oxyCODONE (OXY IR/ROXICODONE) 5 MG immediate release tablet Take 1 tablet (5 mg total) by mouth every 6 (six) hours as needed for severe pain. 07/12/15  Yes Shanker Kristeen Mans, MD  pantoprazole (PROTONIX) 40 MG tablet Take 40 mg by mouth daily.   Yes Historical Provider, MD  polyethylene glycol (MIRALAX / GLYCOLAX) packet Take 17 g by mouth daily.   Yes Historical Provider, MD  prochlorperazine (COMPAZINE) 10 MG tablet TAKE 1 TABLET EVERY 6 HOURS AS NEEDED FOR CHEMO NAUSEA-MAY TAKE AROUND THE CLOCK FOR PERSISTENT SYMP 01/30/15  Yes Historical Provider, MD  sennosides-docusate sodium (SENOKOT-S) 8.6-50 MG tablet Take 1 tablet by mouth 2 (two) times daily as needed for constipation.   Yes Historical Provider, MD  VENTOLIN HFA 108 (90 BASE) MCG/ACT inhaler Inhale 2 puffs into the lungs every 4 (four) hours as needed for wheezing or shortness of breath.  10/12/14  Yes Historical Provider, MD   BP 152/48 mmHg  Pulse 72  Temp(Src) 97.8 F (36.6 C) (Oral)  Resp 20  SpO2 100%    Physical Exam  Constitutional: She is oriented to person, place, and time. She appears well-developed and well-nourished.  HENT:  Head: Normocephalic and atraumatic.  Eyes: Conjunctivae are normal. Pupils are equal, round, and reactive to light. Right eye exhibits no discharge. Left eye exhibits no discharge. No scleral icterus.  Neck: Normal range of motion. No JVD present. No tracheal deviation present.  Pulmonary/Chest: Effort normal. No stridor.  Neurological: She is alert and oriented to person, place, and time. Coordination normal.  Psychiatric: She has a normal mood and affect. Her behavior is normal. Judgment and thought content normal.  Nursing note and vitals reviewed.     ED Course  Procedures (including critical care time) Labs Review Labs Reviewed  BASIC METABOLIC PANEL - Abnormal; Notable for the following:    Chloride 99 (*)    CO2 21 (*)    Glucose, Bld 140 (*)    BUN 31 (*)    Creatinine, Ser 6.29 (*)    Calcium 8.6 (*)    GFR calc non Af Amer 5 (*)    GFR calc Af Amer 6 (*)    Anion gap 16 (*)    All other components within normal limits  CBC - Abnormal; Notable for the following:    RBC 3.02 (*)    Hemoglobin 8.1 (*)    HCT 25.0 (*)    RDW 21.2 (*)    All other components within normal limits  CBG MONITORING, ED - Abnormal; Notable for the following:    Glucose-Capillary 117 (*)    All other components within normal limits  URINALYSIS, ROUTINE W REFLEX MICROSCOPIC (NOT AT [facility])  BLOOD GAS, ARTERIAL    Imaging Review Dg Chest 2 View  12/09/2015  CLINICAL DATA:  Altered mental status for 2 days.  Confusion EXAM: CHEST  2 VIEW COMPARISON:  07/11/2015 FINDINGS: Right dialysis catheter remains in place, unchanged. Cardiomegaly. There is a small right pleural effusion. Bibasilar airspace opacities are noted which could represent edema or infection. Vascular congestion. No acute  bony abnormality. IMPRESSION: Cardiomegaly. Bilateral lower lobe airspace  opacities with vascular congestion. Findings could reflect edema or infection. Small right pleural effusion. Electronically Signed   By: Rolm Baptise M.D.   On: 12/09/2015 15:29   Ct Head Wo Contrast  12/09/2015  CLINICAL DATA:  Confusion.  Altered mental status. EXAM: CT HEAD WITHOUT CONTRAST TECHNIQUE: Contiguous axial images were obtained from the base of the skull through the vertex without intravenous contrast. COMPARISON:  MRI 12/25/2014 FINDINGS: There is atrophy and chronic small vessel disease changes. No acute intracranial abnormality. Specifically, no hemorrhage, hydrocephalus, mass lesion, acute infarction, or significant intracranial injury. No acute calvarial abnormality. Extensive mucosal thickening throughout the paranasal sinuses. Polypoid soft tissue within the right nasal cavity. Mastoid air cells are clear. IMPRESSION: No acute intracranial abnormality. Atrophy, chronic microvascular disease. Chronic sinusitis, probable nasal polyposis. Electronically Signed   By: Rolm Baptise M.D.   On: 12/09/2015 15:50   Dg Foot Complete Left  12/09/2015  CLINICAL DATA:  Infection, black toes second third and fourth toe EXAM: LEFT FOOT - COMPLETE 3+ VIEW COMPARISON:  11/15/2015 FINDINGS: Three views of the left foot submitted. No acute fracture or subluxation. Vascular calcifications are again noted. There is diffuse osteopenia. No acute fracture or subluxation. Again noted mild narrowing of interphalangeal joints. Stable plantar and posterior spurring of calcaneus. No bony erosion or periosteal reaction. No definite evidence of bone destruction. IMPRESSION: No acute fracture or subluxation. Stable mild degenerative changes. Diffuse osteopenia. Atherosclerotic vascular calcifications. No periosteal reaction or bony erosion. Again noted plantar and posterior spurring of calcaneus. Electronically Signed   By: Lahoma Crocker M.D.   On: 12/09/2015 09:17   I have personally reviewed and evaluated these images  and lab results as part of my medical decision-making.   EKG Interpretation   Date/Time:  Monday December 09 2015 08:06:31 EDT Ventricular Rate:  74 PR Interval:  242 QRS Duration: 143 QT Interval:  518 QTC Calculation: 575 R Axis:   -45 Text Interpretation:  Sinus or ectopic atrial rhythm Prolonged PR interval  Left atrial enlargement IVCD, consider atypical RBBB LVH with IVCD, LAD  and secondary repol abnrm Inferior infarct, age indeterminate Prolonged QT  interval Confirmed by CAMPOS  MD, KEVIN ([id number]) on 12/09/2015 8:12:51 AM      MDM   Final diagnoses:  Confusion  Ischemic rest pain of lower extremity (HCC)    Labs: BMP, CBC, CBC-  Imaging:  Consults:  Therapeutics:  Discharge Meds:   Assessment/Plan: 41 female presents today with gangrenous left foot. Patient is seen multiple cardiologists with attempts at opening up her tibial arteries without success. She has progression of the disease. I consulted vascular surgery regarding this patient. Vascular PA evaluated the patient with recommendation the patient will likely need hospital admission and below the knee amputation. Upon patient's arrival she was afebrile nontoxic in no apparent distress. She had pain with manipulation of the foot, but was responding appropriately to questioning. Daughter notes that over the last 2 days she has intermittently been responding to direction. She notes this started shortly after her taking hydrocodone for the pain, which she no she has not taken since Saturday (2 days ago). Upon reassessment patient was responding to both verbal and painful stimuli, but would not provide any further details. Patient does not appear to have any infectious etiology on her exam.         Okey Regal, PA-C 12/09/15 [place], MD 12/09/15 [phone]

## 2015-12-09 NOTE — H&P (Signed)
Date: 12/09/2015               Patient Name:  Annette Roach MRN: [id number]  DOB: Sep 29, 1927 Age / Sex: 82 y.o., female   PCP: Raina Mina, MD              Medical Service: Internal Medicine Teaching Service              Attending Physician: Dr. Bartholomew Crews, MD    First Contact: Wille Celeste, [place] [facility] Pager: [phone]  Second Contact: Dr. Albin Felling Pager: [phone]            After Hours (After 5p/  First Contact Pager: [phone]  weekends / holidays): Second Contact Pager: [phone]   Chief Complaint:  Cold limb with black toe and confusion   History of Present Illness: Annette Roach is an 82 y.o. Woman with a history of ESRD, gastric GIST, peripheral artery disease, CHF, type 2 DM, who presents to the ED with a black and cold left lower leg and confusion.  She is lethargic and not answering questions and the history was given by her daughter.  He daughter reports that she has been in this condition since Saturday after she went to dialysis.  At the dialysis center she was becoming increasingly confused and was febrile.  She was given antibiotics, but her mental status has not improved. She increased her dose of oxycodone on Friday to 10 mg due to increasing pain in her leg, but the daughter reports that she has not had any more oxycodone since Sunday.  Her reported baseline is normally alert and interactive and oriented to person, place and time, however she has not been at baseline since returning from dialysis.  The daughter denies any trauma, fever, n/v, agitation. She does endorse that her mother has been "breathing differently" but could not elaborate further what she meant by that.    With regard to her left leg, Annette Roach has been complaining of increasing pain in her left foot for 3 weeks.  She has known vascular occlusion of left anterior and posterior tibial arteries.  She was scheduled for follow-up with podiatry today, but due to pain she was unable to make it to the appointment  and instead came to the ED.  Her daughter denies any fever, foul-smells from the leg, or any exudate or discharge from the leg.    In the ED she was seen by vascular surgery, who recommended above knee amputation, which they plan to do on Wednesday. A blood glucose was 114 mg/dL, and a CT scan of the head was normal.  CXR was positive for patchy infiltrates that could indicate infection.    Meds: No current facility-administered medications for this encounter.   Current Outpatient Prescriptions  Medication Sig Dispense Refill  . acetaminophen (TYLENOL) 325 MG tablet Take 2 tablets (650 mg total) by mouth every 6 (six) hours as needed for mild pain or headache.    Marland Kitchen aspirin EC 81 MG tablet Take 81 mg by mouth daily.    . Azelastine HCl 0.15 % SOLN Place 1 spray into both nostrils daily.   3  . calcium acetate (PHOSLO) 667 MG capsule Take 667-1,334 mg by mouth 3 (three) times daily with meals. 1 capsule (667mg ) with breakfast and lunch, 2 capsules (1334mg ) with dinner    . cinacalcet (SENSIPAR) 30 MG tablet Take 30 mg by mouth daily.    . clopidogrel (PLAVIX) 75 MG tablet Take 75 mg  by mouth daily.    . hydrALAZINE (APRESOLINE) 10 MG tablet Take 10 mg by mouth 2 (two) times daily.    Marland Kitchen imatinib (GLEEVEC) 100 MG tablet Take 100 mg by mouth 2 (two) times daily.     . metoprolol succinate (TOPROL-XL) 25 MG 24 hr tablet Take 25 mg by mouth daily.    . mirtazapine (REMERON) 15 MG tablet Take 7.5 mg by mouth at bedtime.     . multivitamin (RENA-VIT) TABS tablet Take 1 tablet by mouth daily.    . nitroGLYCERIN (NITROSTAT) 0.4 MG SL tablet Place 0.4 mg under the tongue every 5 (five) minutes as needed for chest pain.    Marland Kitchen ondansetron (ZOFRAN) 4 MG tablet Take 4 mg by mouth every 8 (eight) hours as needed for nausea or vomiting.    Marland Kitchen oxyCODONE (OXY IR/ROXICODONE) 5 MG immediate release tablet Take 1 tablet (5 mg total) by mouth every 6 (six) hours as needed for severe pain. 20 tablet 0  . pantoprazole  (PROTONIX) 40 MG tablet Take 40 mg by mouth daily.    . polyethylene glycol (MIRALAX / GLYCOLAX) packet Take 17 g by mouth daily.    . prochlorperazine (COMPAZINE) 10 MG tablet TAKE 1 TABLET EVERY 6 HOURS AS NEEDED FOR CHEMO NAUSEA-MAY TAKE AROUND THE CLOCK FOR PERSISTENT SYMP  2  . sennosides-docusate sodium (SENOKOT-S) 8.6-50 MG tablet Take 1 tablet by mouth 2 (two) times daily as needed for constipation.    . VENTOLIN HFA 108 (90 BASE) MCG/ACT inhaler Inhale 2 puffs into the lungs every 4 (four) hours as needed for wheezing or shortness of breath.   3    Allergies: Allergies as of 12/09/2015 - Review Complete 12/09/2015  Allergen Reaction Noted  . Oxycodone-acetaminophen Other (See Comments) 12/09/2015   Past Medical History  Diagnosis Date  . COPD (chronic obstructive pulmonary disease) (HCC)     emphysema  . Neuropathy ([place])   . Hyperlipidemia   . Diabetes mellitus   . Depression   . Anemia   . Shortness of breath   . GERD (gastroesophageal reflux disease)   . Hyperparathyroidism ([place])   . Hypertension     sees Dr. Gilford Rile  . Headache(784.0)   . Arthritis   . CAD (coronary artery disease)     sees Dr. Jenne Campus, [facility], [place]  . Steal syndrome of hand ([place]) jan- 2014    left hand  . Wears glasses   . Wears hearing aid     right  . Presence of surgically created AV shunt for hemodialysis [facility])     old lt upper arm out-rt upper arm shunt in  . Anginal pain ([place])     Dr Raliegh Ip in [place], [place]  . History of kidney stones   . Constipation   . History of blood transfusion   . Asthma     years ago  . Chronic kidney disease     TTH [facility] [place], - Hemo  . Renal insufficiency     patient on dialysis for 3 years (today is 03/22/15  . Pneumonia 03/2015   Past Surgical History  Procedure Laterality Date  . Abdominal hysterectomy  1976  . Kidney stone surgery  2013  . Av fistula placement  01/20/2012    Procedure: ARTERIOVENOUS  (AV) FISTULA CREATION;  Surgeon: Elam Dutch, MD;  Location: [place];  Service: Vascular;  Laterality: Left;  . Hemodialysis catheter  05/25/12    secondary to failed AVF   .  Eye surgery      bilateral cataract removed  . Av fistula placement  09/05/2012    Procedure: INSERTION OF ARTERIOVENOUS (AV) GORE-TEX GRAFT ARM;  Surgeon: Elam Dutch, MD;  Location[facility];  Service: Vascular;  Laterality: Left;  Using 4-76mm x 45 cm Vascular stretch goretex graft  . Ligation of arteriovenous  fistula  09/05/2012    Procedure: LIGATION OF ARTERIOVENOUS  FISTULA;  Surgeon: Elam Dutch, MD;  Location: [place];  Service: Vascular;  Laterality: Left;  . Ligation arteriovenous gortex graft  09/05/2012    Procedure: LIGATION ARTERIOVENOUS GORTEX GRAFT;  Surgeon: Elam Dutch, MD;  Location: [place];  Service: Vascular;  Laterality: Left;  . Av fistula placement Right 11/07/2012    Procedure: ARTERIOVENOUS (AV) FISTULA CREATION;  Surgeon: Elam Dutch, MD;  [facility];  Service: Vascular;  Laterality: Right;  Bascilic Vein Fistula  . Appendectomy    . Carpal tunnel release Left 02/10/2013    Procedure: CARPAL TUNNEL RELEASE;  Surgeon: Cammie Sickle., MD;  Location: [place];  Service: Orthopedics;  Laterality: Left;  . Bascilic vein transposition Right 03/15/2013    Procedure: 2ND STAGE BASCILIC VEIN TRANSPOSITION - RIGHT ARM;  Surgeon: Elam Dutch, MD;  Location: [place];  Service: Vascular;  Laterality: Right;  . Av fistula placement Right 05/15/2013    Procedure: INSERTION OF ARTERIOVENOUS (AV) GORE-TEX GRAFT ARM-RIGHT;  Surgeon: Elam Dutch, MD;  Location: [place];  Service: Vascular;  Laterality: Right;  . Shuntogram N/A 07/27/2012    Procedure: Earney Mallet;  Surgeon: Rosetta Posner, MD;  Location[facility] CATH LAB;  Service: Cardiovascular;  Laterality: N/A;   Family History  Problem Relation Age of Onset  . Diabetes Mother   . Cancer Father   . Deep vein thrombosis  Brother   . Hypertension Brother    Social History   Social History  . Marital Status: Married    Spouse Name: N/A  . Number of Children: N/A  . Years of Education: N/A   Occupational History  . Not on file.   Social History Main Topics  . Smoking status: Never Smoker   . Smokeless tobacco: Never Used  . Alcohol Use: No  . Drug Use: No  . Sexual Activity: Not on file   Other Topics Concern  . Not on file   Social History Narrative    Review of Systems: Constitutional: negative Eyes: negative Ears, nose, mouth, throat, and face: negative Respiratory: negative Cardiovascular: negative Gastrointestinal: positive for constipation Genitourinary:negative Musculoskeletal:as per HPI Neurological: positive for AMS as noted in HPI Behavioral/Psych: negative  Physical Exam: Blood pressure 146/52, pulse 72, temperature 97.8 F (36.6 C), temperature source Oral, resp. rate 25, SpO2 98 %. General appearance: mild distress and eyes closed, and not responding to questions, stuporous Head: Normocephalic, without obvious abnormality, atraumatic Eyes: negative findings: pupils equal, round, reactive to light and accomodation Lungs: clear to auscultation bilaterally Heart: regular rate and rhythm, S1, S2 normal, no murmur, click, rub or gallop Abdomen: soft, non-tender; bowel sounds normal; no masses,  no organomegaly Extremities: Cold Left lower leg with gangrenous, black, 2nd metarsal.  otherwise extremities normal with no edema. Pulses: 1+ and symmetric, with the exception of L dorsal pedal, which could not be palpated. Neurologic: Mental status: alertness: obtunded, orientation: person, place  Lab results: Basic Metabolic Panel:  Recent Labs  12/09/15 0858  NA 136  K 4.9  CL 99*  CO2 21*  GLUCOSE 140*  BUN 31*  CREATININE 6.29*  CALCIUM 8.6*   CBC:  Recent Labs  12/09/15 0858  WBC 9.4  HGB 8.1*  HCT 25.0*  MCV 82.8  PLT 199    Recent Labs   12/09/15 0817 12/09/15 1648  GLUCAP 117* 99     Imaging results:  Dg Chest 2 View  12/09/2015  CLINICAL DATA:  Altered mental status for 2 days.  Confusion EXAM: CHEST  2 VIEW COMPARISON:  07/11/2015 FINDINGS: Right dialysis catheter remains in place, unchanged. Cardiomegaly. There is a small right pleural effusion. Bibasilar airspace opacities are noted which could represent edema or infection. Vascular congestion. No acute bony abnormality. IMPRESSION: Cardiomegaly. Bilateral lower lobe airspace opacities with vascular congestion. Findings could reflect edema or infection. Small right pleural effusion. Electronically Signed   By: Rolm Baptise M.D.   On: 12/09/2015 15:29   Ct Head Wo Contrast  12/09/2015  CLINICAL DATA:  Confusion.  Altered mental status. EXAM: CT HEAD WITHOUT CONTRAST TECHNIQUE: Contiguous axial images were obtained from the base of the skull through the vertex without intravenous contrast. COMPARISON:  MRI 12/25/2014 FINDINGS: There is atrophy and chronic small vessel disease changes. No acute intracranial abnormality. Specifically, no hemorrhage, hydrocephalus, mass lesion, acute infarction, or significant intracranial injury. No acute calvarial abnormality. Extensive mucosal thickening throughout the paranasal sinuses. Polypoid soft tissue within the right nasal cavity. Mastoid air cells are clear. IMPRESSION: No acute intracranial abnormality. Atrophy, chronic microvascular disease. Chronic sinusitis, probable nasal polyposis. Electronically Signed   By: Rolm Baptise M.D.   On: 12/09/2015 15:50   Dg Foot Complete Left  12/09/2015  CLINICAL DATA:  Infection, black toes second third and fourth toe EXAM: LEFT FOOT - COMPLETE 3+ VIEW COMPARISON:  11/15/2015 FINDINGS: Three views of the left foot submitted. No acute fracture or subluxation. Vascular calcifications are again noted. There is diffuse osteopenia. No acute fracture or subluxation. Again noted mild narrowing of  interphalangeal joints. Stable plantar and posterior spurring of calcaneus. No bony erosion or periosteal reaction. No definite evidence of bone destruction. IMPRESSION: No acute fracture or subluxation. Stable mild degenerative changes. Diffuse osteopenia. Atherosclerotic vascular calcifications. No periosteal reaction or bony erosion. Again noted plantar and posterior spurring of calcaneus. Electronically Signed   By: Lahoma Crocker M.D.   On: 12/09/2015 09:17    Assessment & Plan by Problem: Active Problems:   Dry gangrene ([place])  Annette Roach is an 82 y.o. Chronically ill appearing woman, with a history of ESRD on HD, CHF, PAD, gastric GIST, and type 2 DM who presents to the ED with left lower extremity pain, a gangrenous and cold left foot, and altered mental status, worrisome for toxic metabolic encephalopathy, infection, or possible worsening of her chronic disease state.  Dry gangrene of L lower leg: Patient has known severe peripheral artery disease that has failed multiple treatments.  Left leg is cold and with necrotic 2nd toe.  Vascular surgery saw her in the ED and has scheduled her for AKA on Wednesday.   --Vascular surgery following --AKA on Wednesday  Altered Mental Status: Daughter reports that patient has not been at baseline cognitive level since Saturday after dialysis.  Was started on antibiotics at that time.  Has remained afebrile.  Patient was unable to interact and follow commands in the ED.  Was somewhat oriented to place and person but not to time. Venous blood gases are not not concerning for hypercarbia.  Currently the patient is not hypoxic or hypoglycemic.  BMP showed normal electrolytes. CT scan of the head was negative for any contributing CNS pathology that could explain her AMS.  CXR indicated infiltrates that could be concerning for pneumonia, but she is afebrile, and has a normal WBC count.  She currently is showing no signs of CNS infection or other source of infection.   Oxycodone dose was apparently increased prior to change in mental status and could be contributing since the onset of confusion occurred after the increase in dose, and the daughter endorses confusion with opioids.   Prior notes from other providers mention confusion, so it is hard to delineate how far she is from baseline based on the daughter's description vs. Other providers who have seen the patient in the past year.  It is possible that she waxes and wanes between this state and a somewhat more coherent state.  Given the patient's frail health, we will not do an extensive work-up to search for a cause of this confusion.   --Check liver enzyme panel --Check TSH  ESRD on hemodialysis: Gets hemodialysis on T/Th/Sat.   --Nephrology consulted and will see her tomorrow --Hold home Phoslo, and cinacalcet due to AMS and NPO status prior to surgery  Type II DM: Currently diet controlled.   --Follow blood glucose with daily BMP  Anemia: Hb 8.1.  Baseline around 9, but has not had recent CBC in past 3 months at [facility]  Likely secondary to renal disease and hemodialysis.  No indication for transfusion at this time.   Gastric GIST: --Holding home imatinib prior to surgery  Advanced care planning: Patient has advanced chronic illness and multiple hospitalizations in the past year.  According to notes, hospice and palliative care have been discussed with the family, but they have not moved in this direction.  Given the patient's life expectancy, and advancing chronic illness, we will re-address goals of care with the Annette Roach's daughter, who is her health care POA.  Currently she is DNR.   --discuss goals of care and revisit hospice/palliative care  Hypertension/CHF: BP in ED was as high as 181/49.  Will d/c oral medications prior to surgery.   --Hold home hydralazine and aspirin  Diet: NPO for surgery  DVT PPX: --Heparin 5000 U  Dispo: Patient here for surgery.  Anticipate continued  hospitalization for 3-5 days.  This is a Careers information officer Note.  The care of the patient was discussed with Dr. Arcelia Jew and the assessment and plan was formulated with their assistance.  Please see their note for official documentation of the patient encounter.   Signed: [place], Med Student 12/09/2015, 5:06 PM

## 2015-12-10 ENCOUNTER — Encounter (HOSPITAL_COMMUNITY): Payer: Self-pay | Admitting: Certified Registered Nurse Anesthetist

## 2015-12-10 ENCOUNTER — Encounter (HOSPITAL_COMMUNITY): Payer: Medicare Other

## 2015-12-10 DIAGNOSIS — Z66 Do not resuscitate: Secondary | ICD-10-CM

## 2015-12-10 DIAGNOSIS — R4182 Altered mental status, unspecified: Secondary | ICD-10-CM

## 2015-12-10 DIAGNOSIS — I2510 Atherosclerotic heart disease of native coronary artery without angina pectoris: Secondary | ICD-10-CM

## 2015-12-10 DIAGNOSIS — E875 Hyperkalemia: Secondary | ICD-10-CM

## 2015-12-10 DIAGNOSIS — E1152 Type 2 diabetes mellitus with diabetic peripheral angiopathy with gangrene: Principal | ICD-10-CM

## 2015-12-10 DIAGNOSIS — Z79899 Other long term (current) drug therapy: Secondary | ICD-10-CM

## 2015-12-10 DIAGNOSIS — C49A0 Gastrointestinal stromal tumor, unspecified site: Secondary | ICD-10-CM

## 2015-12-10 DIAGNOSIS — E1122 Type 2 diabetes mellitus with diabetic chronic kidney disease: Secondary | ICD-10-CM

## 2015-12-10 DIAGNOSIS — I5022 Chronic systolic (congestive) heart failure: Secondary | ICD-10-CM

## 2015-12-10 DIAGNOSIS — I132 Hypertensive heart and chronic kidney disease with heart failure and with stage 5 chronic kidney disease, or end stage renal disease: Secondary | ICD-10-CM

## 2015-12-10 DIAGNOSIS — Z7982 Long term (current) use of aspirin: Secondary | ICD-10-CM

## 2015-12-10 DIAGNOSIS — I252 Old myocardial infarction: Secondary | ICD-10-CM

## 2015-12-10 LAB — TROPONIN I
TROPONIN I: 0.64 ng/mL — AB (ref <0.031)
Troponin I: 0.63 ng/mL (ref <0.031)
Troponin I: 0.52 ng/mL (ref <0.031)
Troponin I: 0.6 ng/mL (ref <0.031)

## 2015-12-10 LAB — COMPREHENSIVE METABOLIC PANEL
ALBUMIN: 2.4 g/dL — AB (ref 3.5–5.0)
ALT: 31 U/L (ref 14–54)
ANION GAP: 17 — AB (ref 5–15)
AST: 115 U/L — ABNORMAL HIGH (ref 15–41)
Alkaline Phosphatase: 56 U/L (ref 38–126)
BUN: 42 mg/dL — AB (ref 6–20)
CALCIUM: 7.8 mg/dL — AB (ref 8.9–10.3)
CO2: 19 mmol/L — ABNORMAL LOW (ref 22–32)
Chloride: 100 mmol/L — ABNORMAL LOW (ref 101–111)
Creatinine, Ser: 8.04 mg/dL — ABNORMAL HIGH (ref 0.44–1.00)
GFR calc Af Amer: 5 mL/min — ABNORMAL LOW (ref >60)
GFR calc non Af Amer: 4 mL/min — ABNORMAL LOW (ref >60)
Glucose, Bld: 86 mg/dL (ref 65–99)
Potassium: 5.2 mmol/L — ABNORMAL HIGH (ref 3.5–5.1)
Sodium: 136 mmol/L (ref 135–145)
Total Bilirubin: 1 mg/dL (ref 0.3–1.2)
Total Protein: 7.2 g/dL (ref 6.5–8.1)

## 2015-12-10 LAB — GLUCOSE, CAPILLARY
GLUCOSE-CAPILLARY: 83 mg/dL (ref 65–99)
GLUCOSE-CAPILLARY: 58 mg/dL — AB (ref 65–99)
GLUCOSE-CAPILLARY: 180 mg/dL — AB (ref 65–99)
GLUCOSE-CAPILLARY: 134 mg/dL — AB (ref 65–99)
GLUCOSE-CAPILLARY: 124 mg/dL — AB (ref 65–99)
Glucose-Capillary: 87 mg/dL (ref 65–99)
Glucose-Capillary: 90 mg/dL (ref 65–99)

## 2015-12-10 LAB — CBC
HCT: 24.3 % — ABNORMAL LOW (ref 36.0–46.0)
Hemoglobin: 7.9 g/dL — ABNORMAL LOW (ref 12.0–15.0)
MCH: 26.5 pg (ref 26.0–34.0)
MCHC: 32.5 g/dL (ref 30.0–36.0)
MCV: 81.5 fL (ref 78.0–100.0)
Platelets: 245 10*3/uL (ref 150–400)
RBC: 2.98 MIL/uL — ABNORMAL LOW (ref 3.87–5.11)
RDW: 20.9 % — ABNORMAL HIGH (ref 11.5–15.5)
WBC: 11.7 10*3/uL — ABNORMAL HIGH (ref 4.0–10.5)

## 2015-12-10 LAB — HEMOGLOBIN A1C
Hgb A1c MFr Bld: 5.9 % — ABNORMAL HIGH (ref 4.8–5.6)
Mean Plasma Glucose: 123 mg/dL

## 2015-12-10 LAB — T4, FREE: Free T4: 1 ng/dL (ref 0.61–1.12)

## 2015-12-10 MED ORDER — SODIUM CHLORIDE 0.9 % IV SOLN: 100.0000 mL | INTRAVENOUS | Status: DC | PRN

## 2015-12-10 MED ORDER — DARBEPOETIN ALFA 200 MCG/0.4ML IJ SOSY
200.0000 ug | PREFILLED_SYRINGE | INTRAMUSCULAR | Status: DC
  Administered 2015-12-11: 200 ug via INTRAVENOUS

## 2015-12-10 MED ORDER — HYDRALAZINE HCL 20 MG/ML IJ SOLN
2.0000 mg | INTRAMUSCULAR | Status: DC | PRN
Start: 2015-12-10 — End: 2015-12-13
  Administered 2015-12-11 (×2): 2 mg via INTRAVENOUS
  Filled 2015-12-10 (×2): qty 1

## 2015-12-10 MED ORDER — CHLORHEXIDINE GLUCONATE 0.12 % MT SOLN
15.0000 mL | Freq: Two times a day (BID) | OROMUCOSAL | Status: DC
  Administered 2015-12-11 – 2015-12-12 (×4): 15 mL via OROMUCOSAL
  Filled 2015-12-10 (×4): qty 15

## 2015-12-10 MED ORDER — ALTEPLASE 2 MG IJ SOLR
2.0000 mg | Freq: Once | INTRAMUSCULAR | Status: DC | PRN
  Filled 2015-12-10 (×3): qty 2

## 2015-12-10 MED ORDER — DEXTROSE-NACL 5-0.9 % IV SOLN
INTRAVENOUS | Status: DC
  Administered 2015-12-10: 75 mL/h via INTRAVENOUS

## 2015-12-10 MED ORDER — DEXTROSE 50 % IV SOLN
INTRAVENOUS | Status: AC
  Administered 2015-12-10: 50 mL
  Filled 2015-12-10: qty 50

## 2015-12-10 MED ORDER — DARBEPOETIN ALFA 200 MCG/0.4ML IJ SOSY: 200.0000 ug | PREFILLED_SYRINGE | INTRAMUSCULAR | Status: DC

## 2015-12-10 MED ORDER — HEPARIN SODIUM (PORCINE) 1000 UNIT/ML DIALYSIS: 1000.0000 [IU] | INTRAMUSCULAR | Status: DC | PRN

## 2015-12-10 MED ORDER — SODIUM CHLORIDE 0.9 % IV SOLN: 125.0000 mg | INTRAVENOUS | Status: DC

## 2015-12-10 MED ORDER — SODIUM CHLORIDE 0.9 % IV SOLN
125.0000 mg | INTRAVENOUS | Status: DC
  Administered 2015-12-11 – 2015-12-12 (×2): 125 mg via INTRAVENOUS
  Filled 2015-12-10 (×3): qty 10

## 2015-12-10 MED ORDER — PENTAFLUOROPROP-TETRAFLUOROETH EX AERO: 1.0000 "application " | INHALATION_SPRAY | CUTANEOUS | Status: DC | PRN

## 2015-12-10 MED ORDER — CETYLPYRIDINIUM CHLORIDE 0.05 % MT LIQD
7.0000 mL | Freq: Two times a day (BID) | OROMUCOSAL | Status: DC
  Administered 2015-12-11 – 2015-12-12 (×3): 7 mL via OROMUCOSAL

## 2015-12-10 MED ORDER — HEPARIN SODIUM (PORCINE) 1000 UNIT/ML DIALYSIS
100.0000 [IU]/kg | INTRAMUSCULAR | Status: DC | PRN
  Filled 2015-12-10: qty 6

## 2015-12-10 MED ORDER — CALCITRIOL 0.25 MCG PO CAPS
2.2500 ug | ORAL_CAPSULE | ORAL | Status: DC
  Administered 2015-12-12: 2.25 ug via ORAL
  Filled 2015-12-10: qty 9

## 2015-12-10 MED ORDER — LIDOCAINE HCL (PF) 1 % IJ SOLN: 5.0000 mL | INTRAMUSCULAR | Status: DC | PRN

## 2015-12-10 MED ORDER — LIDOCAINE-PRILOCAINE 2.5-2.5 % EX CREA: 1.0000 "application " | TOPICAL_CREAM | CUTANEOUS | Status: DC | PRN

## 2015-12-10 MED ORDER — DEXTROSE 5 % IV SOLN
1.5000 g | INTRAVENOUS | Status: DC
  Filled 2015-12-10: qty 1.5

## 2015-12-10 NOTE — Progress Notes (Signed)
Pt family called and would like further updates. Education given to daughter Archie Patten about patients status. Family still expressing concerns and frustration with mother being NPO and no dialysis orders. Education given to family about risk of aspiration with altered mental status, MD Rivet paged and updated with family's concerns. Will continue to monitor.

## 2015-12-10 NOTE — Progress Notes (Signed)
Daughter Annette Roach called and updated time of patient's surgery tomorrow (4/12). Daughter states she will be at bedside in the morning and plans to stay all day with mother. Will continue to monitor.

## 2015-12-10 NOTE — Progress Notes (Signed)
Pt's family updated via phone. Family vocalizing concern about patient not receiving dialysis and npo status. Education given. Will continue to monitor.

## 2015-12-10 NOTE — Progress Notes (Signed)
Daughter Archie Patten at bedside. Expressing frustration concerning her mothers care and would like to see the doctor, stating I drove like a mad man to get her and the least they can do is give me an update about my mother". MD Rivet paged, attending MD Lynnae January on the way to bedside. Family updated. Will continue to monitor.

## 2015-12-10 NOTE — Progress Notes (Signed)
Subjective: No acute events overnight. Her blood sugar was low this morning at 58. This improved to 180 after an amp of D50. She is more awake than yesterday but still only answering questions intermittently. She is not oriented to time or place. Reports ankle pain.  Objective: Vital signs in last 24 hours: Filed Vitals:   12/10/15 0410 12/10/15 0803 12/10/15 0826 12/10/15 0841  BP: 160/52 161/130 176/63   Pulse: 72 86 77   Temp:    98.5 F (36.9 C)  TempSrc:      Resp: 26 32 32   Height:      SpO2: 97% 96% 97%    Weight change:   Intake/Output Summary (Last 24 hours) at 12/10/15 1039 Last data filed at 12/09/15 2200  Gross per 24 hour  Intake      3 ml  Output      0 ml  Net      3 ml   Physical Exam: General: elderly woman resting in bed, lethargic, answers questions intermittently with one word answers HEENT: [place]/AT, PERRL, EOMI, mucus membranes moist CV: RRR, no m/g/r Pulm: CTA bilaterally, breaths non-labored Abd: BS+, soft, non-tender Ext: Unable to palpate distal pulses in left foot. Distal pulses 1+ in right foot. Left foot is cold to touch. Neuro: lethargic, minimally responsive to questions.  Skin: Left 2nd and 4th toes appear black with evidence of necrosis.  Lab Results: Basic Metabolic Panel:  Recent Labs Lab 12/09/15 0858 12/10/15 0751  NA 136 136  K 4.9 5.2*  CL 99* 100*  CO2 21* 19*  GLUCOSE 140* 86  BUN 31* 42*  CREATININE 6.29* 8.04*  CALCIUM 8.6* 7.8*   Liver Function Tests:  Recent Labs Lab 12/09/15 1853 12/10/15 0751  AST 107* 115*  ALT 27 31  ALKPHOS 61 56  BILITOT 0.9 1.0  PROT 7.5 7.2  ALBUMIN 2.4* 2.4*   CBC:  Recent Labs Lab 12/09/15 0858 12/10/15 0751  WBC 9.4 11.7*  HGB 8.1* 7.9*  HCT 25.0* 24.3*  MCV 82.8 81.5  PLT 199 245   Cardiac Enzymes:  Recent Labs Lab 12/09/15 1853 12/10/15 0104 12/10/15 0751  TROPONINI 0.56* 0.63* 0.64*  CBG:  Recent Labs Lab 12/09/15 1648 12/09/15 2052 12/09/15 2331  12/10/15 0408 12/10/15 0843 12/10/15 0936  GLUCAP 99 100* 87 83 58* 180*   Hemoglobin A1C:  Recent Labs Lab 12/09/15 1853  HGBA1C 5.9*   Thyroid Function Tests:  Recent Labs Lab 12/09/15 1853 12/10/15 0104  TSH 5.307*  --   FREET4  --  1.00   Urine Drug Screen: Drugs of Abuse     Component Value Date/Time   LABOPIA NONE DETECTED 12/25/2014 1517   COCAINSCRNUR NONE DETECTED 12/25/2014 1517   LABBENZ NONE DETECTED 12/25/2014 1517   AMPHETMU NONE DETECTED 12/25/2014 1517   THCU NONE DETECTED 12/25/2014 1517   LABBARB NONE DETECTED 12/25/2014 1517     Micro Results: Recent Results (from the past 240 hour(s))  MRSA PCR Screening     Status: None   Collection Time: 12/09/15  7:08 PM  Result Value Ref Range Status   MRSA by PCR NEGATIVE NEGATIVE Final    Comment:        The GeneXpert MRSA Assay (FDA approved for NASAL specimens only), is one component of a comprehensive MRSA colonization surveillance program. It is not intended to diagnose MRSA infection nor to guide or monitor treatment for MRSA infections.    Studies/Results: Dg Chest 2 View  12/09/2015  CLINICAL DATA:  Altered mental status for 2 days.  Confusion EXAM: CHEST  2 VIEW COMPARISON:  07/11/2015 FINDINGS: Right dialysis catheter remains in place, unchanged. Cardiomegaly. There is a small right pleural effusion. Bibasilar airspace opacities are noted which could represent edema or infection. Vascular congestion. No acute bony abnormality. IMPRESSION: Cardiomegaly. Bilateral lower lobe airspace opacities with vascular congestion. Findings could reflect edema or infection. Small right pleural effusion. Electronically Signed   By: Rolm Baptise M.D.   On: 12/09/2015 15:29   Ct Head Wo Contrast  12/09/2015  CLINICAL DATA:  Confusion.  Altered mental status. EXAM: CT HEAD WITHOUT CONTRAST TECHNIQUE: Contiguous axial images were obtained from the base of the skull through the vertex without intravenous  contrast. COMPARISON:  MRI 12/25/2014 FINDINGS: There is atrophy and chronic small vessel disease changes. No acute intracranial abnormality. Specifically, no hemorrhage, hydrocephalus, mass lesion, acute infarction, or significant intracranial injury. No acute calvarial abnormality. Extensive mucosal thickening throughout the paranasal sinuses. Polypoid soft tissue within the right nasal cavity. Mastoid air cells are clear. IMPRESSION: No acute intracranial abnormality. Atrophy, chronic microvascular disease. Chronic sinusitis, probable nasal polyposis. Electronically Signed   By: Rolm Baptise M.D.   On: 12/09/2015 15:50   Dg Foot Complete Left  12/09/2015  CLINICAL DATA:  Infection, black toes second third and fourth toe EXAM: LEFT FOOT - COMPLETE 3+ VIEW COMPARISON:  11/15/2015 FINDINGS: Three views of the left foot submitted. No acute fracture or subluxation. Vascular calcifications are again noted. There is diffuse osteopenia. No acute fracture or subluxation. Again noted mild narrowing of interphalangeal joints. Stable plantar and posterior spurring of calcaneus. No bony erosion or periosteal reaction. No definite evidence of bone destruction. IMPRESSION: No acute fracture or subluxation. Stable mild degenerative changes. Diffuse osteopenia. Atherosclerotic vascular calcifications. No periosteal reaction or bony erosion. Again noted plantar and posterior spurring of calcaneus. Electronically Signed   By: Lahoma Crocker M.D.   On: 12/09/2015 09:17   Medications: I have reviewed the patient's current medications. Scheduled Meds: . heparin  5,000 Units Subcutaneous 3 times per day  . sodium chloride flush  3 mL Intravenous Q12H   Continuous Infusions: . dextrose 5 % and 0.9% NaCl 75 mL/hr (12/10/15 0912)   PRN Meds:. Assessment/Plan:  Ms. Parrent is a 82yo woman with PMHx of CAD s/p previous inferior MI, ESRD on HD TThS schedule, HTN, type 2 DM, GIST tumor, peripheral vascular disease, and CHF (EF  45-50% per echo June 2016) presenting with dry gangrene of her left foot and altered mental status.  Dry Gangrene of the Left Foot: She presented with a 1 month history of worsening ischemic changes in her left foot. Underwent angioplasty at [facility] on 12/02/15 with no success in revascularization. Vascular surgery has evaluated her and recommending BKA of the left leg which family is agreeable to. I think this could be a tricky situation as she is not alert and oriented to consent to surgery. Daughter states she is HCPOA but admits she has not signed paperwork. Daughter reports she has spoken with her mother about this issue and that her mother had agreed for her to be HCPOA. Additionally, she is a high surgical risk with her multiple medical problems. If family wishes to pursue with surgery they should at least be aware of the possibility for her to not do well during surgery or post-surgery. - Plan for BKA on Wed with vascular surgery - Vascular surgery following, appreciate recommendations - Will hold heparin 12 hours before  surgery  AMS: Per daughter, she had an acute change in mental status but after reviewing prior records it is unclear of how much change she has had from her baseline. Multiple office visits with her PCP indicate that she has confusion at baseline. It is unclear to me how interactive she is normally, but per daughter it seems she is able to have conversations. There have been discussions of pursuing hospice, but does not seem patient/family are ready per records. She is a little more interactive today, but still not alert and oriented to time or place. CT head negative in the ED. Her electrolytes are within normal range. Her AST was elevated but she does not drink alcohol. TSH mildly elevated but related to her acute illness. Troponin initially 0.56 and trended up to 0.64. No evidence that she is having chest pain or SOB. EKG with TWI in the anterolateral leads but this has been present on  previous EKGs.  - Trend troponins - Keep NPO for now, can eat if becomes more alert  - If becomes agitated, try reorientation, sitter  ESRD on HD: On TThSat schedule. Last HD 12/07/15.  - Renal consulted, appreciate recommendations - HD today - Hold Phoslo, Sensipar, Rena-vit with AMS  HTN: BPs elevated in [id number] systolic. She is on hydralazine 10 mg BID and toprol-XL 25 mg daily at home.  - Hold home PO meds - Will hold off on antihypertensives as she is getting dialyzed today  Type 2 DM: Last HbA1c 5.3 on 02/23/15. Repeat A1c 5.9. She is diet controlled.  - Will hold off on ISS since NPO and altered - CBG monitoring   CAD s/p MI: She is on ASA, Toprol-XL, and Plavix at home. Troponins mildly elevated. Will continue to trend. Her EKG does have TWI but these were present on prior EKGs. She does not seem to be having chest pain and reported only pain in her ankle when asked about pain.  - Hold home meds with AMS - Trend trops as above  Systolic CHF: Last echo 0000000 with EF 45-50%, moderate LVH, basal inferior and basal inferolateral akinesis, mid inferior and mid inferolateral hypokinesis. She does not appear volume overloaded. 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 Tumor not surgically resected likely secondary to her comorbid medical conditions. - Hold Gleevac given AMS  Diet: NPO VTE Ppx: Heparin SQ Dispo: Disposition is deferred at this time, awaiting improvement of current medical problems.  Anticipated discharge in approximately 2-3 day(s).   The patient does have a current PCP Raina Mina, MD) and does need an [facility] follow-up appointment after discharge.  The patient does not have transportation limitations that hinder transportation to clinic appointments.  .Services Needed at time of discharge: Y = Yes, Blank = No PT:   OT:   RN:   Equipment:   Other:     LOS: 1 day   Juliet Rude, MD 12/10/2015, 10:39 AM

## 2015-12-10 NOTE — Progress Notes (Signed)
Patient arrived to unit by bed.  Reviewed treatment plan and this RN agrees with plan.  Report received from bedside RN, Juanda Crumble.  Consent verified.  Patient AMS, oriented to self, time.   Lung sounds diminished to ausculation in all fields. Generalized edema. Cardiac:  Irregular heart rhythm.  Removed caps and cleansed RIJ catheter with chlorhedxidine.  Aspirated ports of heparin and flushed them with saline per protocol.  Connected and secured lines, initiated treatment at 2100.  UF Goal of 2542mL and net fluid removal 2L.  Will continue to monitor.

## 2015-12-10 NOTE — Progress Notes (Signed)
Paged MD on call for pt BP of 195/37, asked to check manual in 10 minutes.  2039: Checked manual BP, 190/40, notified MD; pt going to hemodialysis; orders received for PRN hydralazine. Will continue to monitor patient once returned from HD.

## 2015-12-10 NOTE — Progress Notes (Signed)
Subjective: No acute events over night.  Remains confused and lethargic this AM.  Not oriented to place or time.  Only responds to questions in one-word answers.  Responds "ankle" when asked if anything hurts.    Objective: Vital signs in last 24 hours: Filed Vitals:   12/10/15 0410 12/10/15 0803 12/10/15 0826 12/10/15 0841  BP: 160/52 161/130 176/63   Pulse: 72 86 77   Temp:    98.5 F (36.9 C)  TempSrc:      Resp: 26 32 32   Height:      SpO2: 97% 96% 97%    Weight change:   Intake/Output Summary (Last 24 hours) at 12/10/15 1055 Last data filed at 12/09/15 2200  Gross per 24 hour  Intake      3 ml  Output      0 ml  Net      3 ml   General appearance: delirious, no distress and slowed mentation and confusion Head: Normocephalic, without obvious abnormality, atraumatic Lungs: clear to auscultation bilaterally Heart: regular rate and rhythm Abdomen: soft, non-tender; bowel sounds normal; no masses,  no organomegaly Extremities: cold L lower leg, with necrotic 2nd toe.   Pulses: 2+ with exception of L dorsal pedal.   Neurologic: Oriented to person only.    Lab Results: Basic Metabolic Panel:  Recent Labs Lab 12/09/15 0858 12/10/15 0751  NA 136 136  K 4.9 5.2*  CL 99* 100*  CO2 21* 19*  GLUCOSE 140* 86  BUN 31* 42*  CREATININE 6.29* 8.04*  CALCIUM 8.6* 7.8*   Liver Function Tests:  Recent Labs Lab 12/09/15 1853 12/10/15 0751  AST 107* 115*  ALT 27 31  ALKPHOS 61 56  BILITOT 0.9 1.0  PROT 7.5 7.2  ALBUMIN 2.4* 2.4*   CBC:  Recent Labs Lab 12/09/15 0858 12/10/15 0751  WBC 9.4 11.7*  HGB 8.1* 7.9*  HCT 25.0* 24.3*  MCV 82.8 81.5  PLT 199 245   Cardiac Enzymes:  Recent Labs Lab 12/09/15 1853 12/10/15 0104 12/10/15 0751  TROPONINI 0.56* 0.63* 0.64*    Recent Labs Lab 12/09/15 1648 12/09/15 2052 12/09/15 2331 12/10/15 0408 12/10/15 0843 12/10/15 0936  GLUCAP 99 100* 87 83 58* 180*   Hemoglobin A1C:  Recent Labs Lab  12/09/15 1853  HGBA1C 5.9*    Recent Labs Lab 12/09/15 1853 12/10/15 0104  TSH 5.307*  --   FREET4  --  1.00    Micro Results: Recent Results (from the past 240 hour(s))  MRSA PCR Screening     Status: None   Collection Time: 12/09/15  7:08 PM  Result Value Ref Range Status   MRSA by PCR NEGATIVE NEGATIVE Final    Comment:        The GeneXpert MRSA Assay (FDA approved for NASAL specimens only), is one component of a comprehensive MRSA colonization surveillance program. It is not intended to diagnose MRSA infection nor to guide or monitor treatment for MRSA infections.    Studies/Results: Dg Chest 2 View  12/09/2015  CLINICAL DATA:  Altered mental status for 2 days.  Confusion EXAM: CHEST  2 VIEW COMPARISON:  07/11/2015 FINDINGS: Right dialysis catheter remains in place, unchanged. Cardiomegaly. There is a small right pleural effusion. Bibasilar airspace opacities are noted which could represent edema or infection. Vascular congestion. No acute bony abnormality. IMPRESSION: Cardiomegaly. Bilateral lower lobe airspace opacities with vascular congestion. Findings could reflect edema or infection. Small right pleural effusion. Electronically Signed   By: Rolm Baptise  M.D.   On: 12/09/2015 15:29   Ct Head Wo Contrast  12/09/2015  CLINICAL DATA:  Confusion.  Altered mental status. EXAM: CT HEAD WITHOUT CONTRAST TECHNIQUE: Contiguous axial images were obtained from the base of the skull through the vertex without intravenous contrast. COMPARISON:  MRI 12/25/2014 FINDINGS: There is atrophy and chronic small vessel disease changes. No acute intracranial abnormality. Specifically, no hemorrhage, hydrocephalus, mass lesion, acute infarction, or significant intracranial injury. No acute calvarial abnormality. Extensive mucosal thickening throughout the paranasal sinuses. Polypoid soft tissue within the right nasal cavity. Mastoid air cells are clear. IMPRESSION: No acute intracranial  abnormality. Atrophy, chronic microvascular disease. Chronic sinusitis, probable nasal polyposis. Electronically Signed   By: Rolm Baptise M.D.   On: 12/09/2015 15:50   Dg Foot Complete Left  12/09/2015  CLINICAL DATA:  Infection, black toes second third and fourth toe EXAM: LEFT FOOT - COMPLETE 3+ VIEW COMPARISON:  11/15/2015 FINDINGS: Three views of the left foot submitted. No acute fracture or subluxation. Vascular calcifications are again noted. There is diffuse osteopenia. No acute fracture or subluxation. Again noted mild narrowing of interphalangeal joints. Stable plantar and posterior spurring of calcaneus. No bony erosion or periosteal reaction. No definite evidence of bone destruction. IMPRESSION: No acute fracture or subluxation. Stable mild degenerative changes. Diffuse osteopenia. Atherosclerotic vascular calcifications. No periosteal reaction or bony erosion. Again noted plantar and posterior spurring of calcaneus. Electronically Signed   By: Lahoma Crocker M.D.   On: 12/09/2015 09:17   Medications:  Scheduled Meds: . heparin  5,000 Units Subcutaneous 3 times per day  . sodium chloride flush  3 mL Intravenous Q12H   Continuous Infusions: . dextrose 5 % and 0.9% NaCl 75 mL/hr (12/10/15 0912)   PRN Meds:. Assessment/Plan: Active Problems:   Dry gangrene (HCC)   Pressure ulcer   Annette Roach is an 82 y.o. Chronically ill appearing woman, with a history of ESRD on HD, CHF, PAD, gastric GIST, and type 2 DM who presents to the ED with left lower extremity pain, a gangrenous and cold left foot, and altered mental status, worrisome for toxic metabolic encephalopathy, infection, or possible worsening of her chronic disease state.  Dry gangrene of L lower leg: Patient has known severe peripheral artery disease that has failed multiple treatments. Left leg is cold and with necrotic 2nd toe. Vascular surgery saw her in the ED and has scheduled her for AKA on Wednesday. However, after further  review of her ECG that showed R ventricular hypertrophy with R heart strain pattern (inverted T waves in V1-V2), increased troponin level (0.64), high blood pressure (SPB 160-180s), and given her ESRD, she is a poor surgical candidate.  We will discuss this with her Roach, who, by verbal consent, is Annette Roach's HCPOA.   --Discuss risks of surgery with Annette Roach --Vascular surgery following --AKA scheduled for Wednesday  Altered Mental Status: Roach reports that patient has not been at baseline cognitive level since Saturday after dialysis. Was started on antibiotics at that time. Has remained afebrile. Patient was unable to interact and follow commands in the ED. Was somewhat oriented to place and person but not to time. This morning her condition is similar.  Further testing did not reveal any certain etiology for her confusion.  AST was slightly elevated, but could be accounted for by muscle necrosis rather than liver disease.  TSH was slightly increased, but could be accounted for by her chronic illnesses.  She continues to be hemodynamically stable with no  signs of infection.  She was undergo dialysis today, which could improve her confusion.  Otherwise we will not carry out further testing to find an etiology for, what appears to be, a normal course of waxing and waning confusion in a chronically ill, elderly woman.    ESRD on hemodialysis: Gets hemodialysis on T/Th/Sat.  --Nephrology consulted; will get HD today --Hold home Phoslo, and cinacalcet due to AMS and NPO status prior to surgery  Type II DM: Currently diet controlled.  --Follow blood glucose with daily BMP  Anemia: Hb 8.1. Baseline around 9, but has not had recent CBC in past 3 months at [facility]. Likely secondary to renal disease and hemodialysis. No indication for transfusion at this time.   Gastric GIST: --Holding home imatinib prior to surgery  Advanced care planning: Patient has advanced  chronic illness and multiple hospitalizations in the past year. According to notes, hospice and palliative care have been discussed with the family, but they have not moved in this direction. Given the patient's life expectancy, and advancing chronic illness, we will re-address goals of care with the Annette Roach, who is her health care POA. Currently she is DNR.  --discuss goals of care and revisit hospice/palliative care  Hypertension/CHF: BP in ED was as high as 181/49. Elevated troponin w/ ECG denoting RV Hypertrophy and RV strain pattern.    --Hold home hydralazine and aspirin --Will reconsider starting home medication if BP is not improved after HD today  Hypoglylcemia: Had decreasing CBG overnight with one reading of 58 this morning.  Was given D5 and level improved to 180 mg/dL --D5 NS at 19mL/hr for 12 hours  Diet: NPO for surgery  DVT PPX: --Heparin 5000 U  Dispo: Patient here for surgery. Anticipate continued hospitalization for 3-5 days.   This is a Careers information officer Note.  The care of the patient was discussed with Dr. Arcelia Jew and the assessment and plan formulated with their assistance.  Please see their attached note for official documentation of the daily encounter.   LOS: 1 day   Burt Ek, Med Student 12/10/2015, 10:55 AM

## 2015-12-10 NOTE — Progress Notes (Signed)
Report given to HD nurse  

## 2015-12-10 NOTE — Consult Note (Signed)
WOC wound consult note Reason for Consult: Deep tissue Injury to buttocks Wound type:Extensive deep tissue injuries to right and left buttocks extending to sacral area. Also noted gangrene to left foot 2nd and 4th toes  Pressure Ulcer POA: Yes Measurement:Right buttocks 12 cm x 4 cm,left buttocks 9 cm x 6 cm Wound bed: N/A   Periwound:intact Dressing procedure/placement/frequency: Low air loss mattress  for pressure relief and apply foam protective dressings to buttocks. Change dressings every 5 days and prn .    Re consult if needed, will not follow at this time. Thanks, Melba Coon MSN, RN, [employer]

## 2015-12-10 NOTE — Progress Notes (Signed)
Date: 12/10/2015  Patient name: Annette Roach  Medical record number: [id number]  Date of birth: 12/04/1927   I have seen and evaluated Annette Roach and discussed their care with the Residency Team.   GOALS OF CARE / END OF LIFE DISCUSSION The daughter and I spoke in the pt's room and had a very pleasant conversation. The pt was not able to participate in the discussion.  DNR  The daughter informed me that the pt is DNR. I asked her to explain what this meant to her. She explained that if her mom were to die, her mom wanted no intervention. I explained CPR, defib, intubation, and IV cardiac meds and she agreed that these were all interventions her mom had stated in past that she did NOT want. I explained that DNR, to my understanding, was rescinded on the OR table. The daughter stated the mom did NOT want resuscitation if she died on the OR table. I asked to daughter to speak to the surgeon about this stipulation as it is out of my domain.  NON FATAL CARDIOPUL COMPLICATIONS Since the daughter stated her mom wanted nothing, I started with a few hypothetical situations. I first asked about IV or PO heart meds. I asked about A Fib and needing a short IV med to correct a fast arrhythmia. The daughter was adamant that her mother did not want this. I explained at least twice this was a likely complication and that the med would just be temp and yet the daughter still was adamant that her mom did not want this. If a complication like A Fib occurs, which is likely with her PMHX, then we will have to readdress this as it would qualify as comfort care.   I then asked about PNA and the daughter was adamant that her mother would not want ABX if it occurred soon after / a complication of surgery (she used up to one week after OR). If it occurred > 1 week after OR, then they would have to consider the ABX at that point.  PERI-OPERATIVE CARDIAC RISK The daughter understands that her mother is at very high risk  for peri-operative cardiac risks. She even started the vascular surgeon refused to do bypass bc her mother "would not survive".   OUTCOMES AFTER SURGERY I asked the daughter to clarify what she thought would happen after surgery. The daughter knows her mother will need rehab and then would return home. The daughter desires that her mother will return to her baseline which is interactive and talkative. The mother was last like this the last week of March - about 2 weeks + ago. I explained that I cannot guarantee that this will be the outcome. The daughter seemed surprised that the amputation would not certainly bring her mother back to baseline. I explained that she was in poor health to begin with and that the post op course would be complicated. The daughter desires that the mother go to SNF with hospice bc she heard they can do good pain control.   I explained that the pt is going to have a very rocky post--op course. I explained that pain control will be challenging esp in balance of sedation. I explained that she was at risk of delirium and of need for restraints. She knows her mom will not walk after OR. I made no guarantees about her mom's mental status after surgery.  NON SURGICAL OPTIONS  Since the daughter states that her mom wants almost no  intervention, I was surprised that they had opted for amputation. She was unaware that no surgery and aggressive pain control with hospice was an option. The availability of this option seemed to throw her for a loop. She considered it for a while but came back to her mom stating she wanted her leg removed bc it was hurting her so much and the daughter wants to honor her mother's wishes for surgical removal. She feels that her mom is currently in pain by the way her mom is holding her face.  FAMILY The daughter states that we can update her son, Cletus Gash, if he is in the room. She has a sister but she lives out of state.   At the end of the conversation, the  daughter plans to proceed with surgery and "hope for the best." I do believe that she has a good understanding of the risks although I am not certain that she understands how sig an undertaking this is and that I doubt her mother will regain her mental function. I explained that we would stabilize hr mother as much as possible to maximize success.   PMHx, Fam Hx, and/or Soc Hx : Has aide. Lives with daughter who works and her grandson.  Filed Vitals:   12/10/15 1300 12/10/15 1304  BP: 169/44   Pulse: 44   Temp:  98.8 F (37.1 C)  Resp: 28    Opens eyes, answer single words. HRRR. ABD decreased BS. Ext L cool to touch, necrotic toes 2nd and 4th. R + DP pulse. No edema  Assessment and Plan: I have seen and evaluated the patient as outlined above. I agree with the formulated Assessment and Plan as detailed in the residents' note, with the following changes:   1. Dry gangrene L foot - BKA tomorrow.  2. AMS - etiology not clear. HD will correct hyperkalemia.  3. ESRD - HD today.  Bartholomew Crews, MD 4/11/20172:17 PM

## 2015-12-10 NOTE — Progress Notes (Signed)
Pt placed on low air loss overlay mattress. Family at bedside. Will continue to monitor.

## 2015-12-10 NOTE — Consult Note (Signed)
Reason for Consult:ESRD, anemia , HPTH Referring Physician: Dr. [name] is an 82 y.o. female.  HPI: 82 yr female with hx of ESRD from DM sinc 2013.  HX PVd with progressive prob.  Had angio in [facility] on 4/1 with options given.  Presented 4/10 with 2 d of confusion.  Had AB on 4/6, 4/8 and 4/10.  Inr of 13.5??on Sat.  Now admitted with gangrenous L leg.  Other prob: Lack of perm access, CAD, HTN , anemia (last Hb 8.4), hx nephrolithiasis, gastric GIST, HPTH, ..   Review of systems not obtained due to patient factors.     Dialyzes at [facility] on TTS since 2013. Primary Nephrologist Annette Roach. EDW 61 kg. HD Bath 2 K, 2 Ca, Dialyzer 160, . Access [facility] PC.  Past Medical History  Diagnosis Date  . COPD (chronic obstructive pulmonary disease) (HCC)     emphysema  . Neuropathy ([place])   . Hyperlipidemia   . Diabetes mellitus   . Depression   . Anemia   . Shortness of breath   . GERD (gastroesophageal reflux disease)   . Hyperparathyroidism ([place])   . Hypertension     sees Dr. [name]  . Headache(784.0)   . Arthritis   . CAD (coronary artery disease)     sees Dr. Jenne Roach, [facility] cardiology [place], Annette Roach  . Steal syndrome of hand ([place]) jan- 2014    left hand  . Wears glasses   . Wears hearing aid     right  . Presence of surgically created AV shunt for hemodialysis [facility])     old lt upper arm out-rt upper arm shunt in  . Anginal pain ([place])     Dr Raliegh Ip in [place], [place]  . History of kidney stones   . Constipation   . History of blood transfusion   . Asthma     years ago  . Chronic kidney disease     TTH [facility] [place], [place]- Hemo  . Renal insufficiency     patient on dialysis for 3 years (today is 03/22/15  . Pneumonia 03/2015    Past Surgical History  Procedure Laterality Date  . Abdominal hysterectomy  1976  . Kidney stone surgery  2013  . Av fistula placement  01/20/2012    Procedure: ARTERIOVENOUS (AV) FISTULA CREATION;  Surgeon: Annette Dutch, MD;  Location: [place];  Service: Vascular;  Laterality: Left;  . Hemodialysis catheter  05/25/12    secondary to failed AVF   . Eye surgery      bilateral cataract removed  . Av fistula placement  09/05/2012    Procedure: INSERTION OF ARTERIOVENOUS (AV) GORE-TEX GRAFT ARM;  Surgeon: Annette Dutch, MD;  [facility];  Service: Vascular;  Laterality: Left;  Using 4-40m x 45 cm Vascular stretch goretex graft  . Ligation of arteriovenous  fistula  09/05/2012    Procedure: LIGATION OF ARTERIOVENOUS  FISTULA;  Surgeon: CElam Dutch MD;  [place]  Service: Vascular;  Laterality: Left;  . Ligation arteriovenous gortex graft  09/05/2012    Procedure: LIGATION ARTERIOVENOUS GORTEX GRAFT;  Surgeon: CElam Dutch MD;  [place]  Service: Vascular;  Laterality: Left;  . Av fistula placement Right 11/07/2012    Procedure: ARTERIOVENOUS (AV) FISTULA CREATION;  Surgeon: CElam Dutch MD;  [facility]  Service: Vascular;  Laterality: Right;  Bascilic Vein Fistula  . Appendectomy    . Carpal tunnel release  Left 02/10/2013    Procedure: CARPAL TUNNEL RELEASE;  Surgeon: Annette Sickle., MD;  Location: [place];  Service: Orthopedics;  Laterality: Left;  . Bascilic vein transposition Right 03/15/2013    Procedure: 2ND STAGE BASCILIC VEIN TRANSPOSITION - RIGHT ARM;  Surgeon: Annette Dutch, MD;  Location: [place];  Service: Vascular;  Laterality: Right;  . Av fistula placement Right 05/15/2013    Procedure: INSERTION OF ARTERIOVENOUS (AV) GORE-TEX GRAFT ARM-RIGHT;  Surgeon: Annette Dutch, MD;  Location: [place];  Service: Vascular;  Laterality: Right;  . Shuntogram N/A 07/27/2012    Procedure: Annette Roach;  Surgeon: Annette Posner, MD;  Location: [facility] CATH LAB;  Service: Cardiovascular;  Laterality: N/A;    Family History  Problem Relation Age of Onset  . Diabetes Mother   . Cancer Father   . Deep vein thrombosis Brother   . Hypertension Brother      Social History:  reports that she has never smoked. She has never used smokeless tobacco. She reports that she does not drink alcohol or use illicit drugs.  Allergies:  Allergies  Allergen Reactions  . Oxycodone-Acetaminophen Other (See Comments)    confusion    Medications:  I have reviewed the patient'Annette Roach current medications. Prior to Admission:  Prescriptions prior to admission  Medication Sig Dispense Refill Last Dose  . acetaminophen (TYLENOL) 325 MG tablet Take 2 tablets (650 mg total) by mouth every 6 (six) hours as needed for mild pain or headache.   Past Month at Unknown time  . aspirin EC 81 MG tablet Take 81 mg by mouth daily.   12/05/2015  . Azelastine HCl 0.15 % SOLN Place 1 spray into both nostrils daily.   3 12/05/2015  . calcium acetate (PHOSLO) 667 MG capsule Take 667-1,334 mg by mouth 3 (three) times daily with meals. 1 capsule (657m) with breakfast and lunch, 2 capsules (13318m with dinner   12/05/2015  . cinacalcet (SENSIPAR) 30 MG tablet Take 30 mg by mouth daily.   12/05/2015  . clopidogrel (PLAVIX) 75 MG tablet Take 75 mg by mouth daily.   12/05/2015  . hydrALAZINE (APRESOLINE) 10 MG tablet Take 10 mg by mouth 2 (two) times daily.   12/05/2015  . imatinib (GLEEVEC) 100 MG tablet Take 100 mg by mouth 2 (two) times daily.    12/05/2015  . metoprolol succinate (TOPROL-XL) 25 MG 24 hr tablet Take 25 mg by mouth daily.   12/05/2015 at 0800  . mirtazapine (REMERON) 15 MG tablet Take 7.5 mg by mouth at bedtime.    12/05/2015  . multivitamin (RENA-VIT) TABS tablet Take 1 tablet by mouth daily.   12/05/2015  . nitroGLYCERIN (NITROSTAT) 0.4 MG SL tablet Place 0.4 mg under the tongue every 5 (five) minutes as needed for chest pain.   unknown  . ondansetron (ZOFRAN) 4 MG tablet Take 4 mg by mouth every 8 (eight) hours as needed for nausea or vomiting.   12/05/2015  . oxyCODONE (OXY IR/ROXICODONE) 5 MG immediate release tablet Take 1 tablet (5 mg total) by mouth every 6 (six) hours as needed for  severe pain. 20 tablet 0 12/05/2015  . pantoprazole (PROTONIX) 40 MG tablet Take 40 mg by mouth daily.   12/05/2015  . polyethylene glycol (MIRALAX / GLYCOLAX) packet Take 17 g by mouth daily.   12/05/2015  . prochlorperazine (COMPAZINE) 10 MG tablet TAKE 1 TABLET EVERY 6 HOURS AS NEEDED FOR CHEMO NAUSEA-MAY TAKE AROUND THE CLOCK FOR PERSISTENT SYMP  2 12/05/2015  . sennosides-docusate sodium (SENOKOT-Annette Roach) 8.6-50 MG tablet Take 1 tablet by mouth 2 (two) times daily as needed for constipation.   12/05/2015  . VENTOLIN HFA 108 (90 BASE) MCG/ACT inhaler Inhale 2 puffs into the lungs every 4 (four) hours as needed for wheezing or shortness of breath.   3 12/05/2015   , Calcitriol2.25 mcg. Micera 110mg iv q 2 wk. , Venofer 1065miv q wk  Results for orders placed or performed during the hospital encounter of 12/09/15 (from the past 48 hour(Annette Roach))  CBG monitoring, ED     Status: Abnormal   Collection Time: 12/09/15  8:17 AM  Result Value Ref Range   Glucose-Capillary 117 (H) 65 - 99 mg/dL  Basic metabolic panel     Status: Abnormal   Collection Time: 12/09/15  8:58 AM  Result Value Ref Range   Sodium 136 135 - 145 mmol/L   Potassium 4.9 3.5 - 5.1 mmol/L   Chloride 99 (L) 101 - 111 mmol/L   CO2 21 (L) 22 - 32 mmol/L   Glucose, Bld 140 (H) 65 - 99 mg/dL   BUN 31 (H) 6 - 20 mg/dL   Creatinine, Ser 6.29 (H) 0.44 - 1.00 mg/dL   Calcium 8.6 (L) 8.9 - 10.3 mg/dL   GFR calc non Af Amer 5 (L) >60 mL/min   GFR calc Af Amer 6 (L) >60 mL/min    Comment: (NOTE) The eGFR has been calculated using the CKD EPI equation. This calculation has not been validated in all clinical situations. eGFR'Annette Roach persistently <60 mL/min signify possible Chronic Kidney Disease.    Anion gap 16 (H) 5 - 15  CBC     Status: Abnormal   Collection Time: 12/09/15  8:58 AM  Result Value Ref Range   WBC 9.4 4.0 - 10.5 K/uL   RBC 3.02 (L) 3.87 - 5.11 MIL/uL   Hemoglobin 8.1 (L) 12.0 - 15.0 g/dL   HCT 25.0 (L) 36.0 - 46.0 %   MCV 82.8 78.0 -  100.0 fL   MCH 26.8 26.0 - 34.0 pg   MCHC 32.4 30.0 - 36.0 g/dL   RDW 21.2 (H) 11.5 - 15.5 %   Platelets 199 150 - 400 K/uL    Comment: PLATELET COUNT CONFIRMED BY SMEAR  CBG monitoring, ED     Status: None   Collection Time: 12/09/15  4:48 PM  Result Value Ref Range   Glucose-Capillary 99 65 - 99 mg/dL  I-Stat venous blood gas, ED     Status: Abnormal   Collection Time: 12/09/15  5:02 PM  Result Value Ref Range   pH, Ven 7.433 (H) 7.250 - 7.300   pCO2, Ven 39.9 (L) 45.0 - 50.0 mmHg   pO2, Ven 36.0 31.0 - 45.0 mmHg   Bicarbonate 26.9 (H) 20.0 - 24.0 mEq/L   TCO2 28 0 - 100 mmol/L   O2 Saturation 73.0 %   Acid-Base Excess 2.0 0.0 - 2.0 mmol/L   Patient temperature 97.49 F    Collection site RADIAL, ALLEN'Annette Roach TEST ACCEPTABLE    Drawn by Operator    Sample type VENOUS    Comment NOTIFIED PHYSICIAN   Hepatic function panel     Status: Abnormal   Collection Time: 12/09/15  6:53 PM  Result Value Ref Range   Total Protein 7.5 6.5 - 8.1 g/dL   Albumin 2.4 (L) 3.5 - 5.0 g/dL   AST 107 (H) 15 - 41 U/L   ALT 27 14 - 54 U/L  Alkaline Phosphatase 61 38 - 126 U/L   Total Bilirubin 0.9 0.3 - 1.2 mg/dL   Bilirubin, Direct 0.3 0.1 - 0.5 mg/dL   Indirect Bilirubin 0.6 0.3 - 0.9 mg/dL  TSH     Status: Abnormal   Collection Time: 12/09/15  6:53 PM  Result Value Ref Range   TSH 5.307 (H) 0.350 - 4.500 uIU/mL  Hemoglobin A1c     Status: Abnormal   Collection Time: 12/09/15  6:53 PM  Result Value Ref Range   Hgb A1c MFr Bld 5.9 (H) 4.8 - 5.6 %    Comment: (NOTE)         Pre-diabetes: 5.7 - 6.4         Diabetes: >6.4         Glycemic control for adults with diabetes: <7.0    Mean Plasma Glucose 123 mg/dL    Comment: (NOTE) Performed At: [facility] [address] [place], [place] [id number] Annette Romp MD AT:[id number]   Troponin I     Status: Abnormal   Collection Time: 12/09/15  6:53 PM  Result Value Ref Range   Troponin I 0.56 (HH) <0.031 ng/mL    Comment:         POSSIBLE MYOCARDIAL ISCHEMIA. SERIAL TESTING RECOMMENDED. CRITICAL RESULT CALLED TO, READ BACK BY AND VERIFIED WITH: M.HARDIN,Annette Roach 2003 12/09/15 Annette Roach,Annette Roach   MRSA PCR Screening     Status: None   Collection Time: 12/09/15  7:08 PM  Result Value Ref Range   MRSA by PCR NEGATIVE NEGATIVE    Comment:        The GeneXpert MRSA Assay (FDA approved for NASAL specimens only), is one component of a comprehensive MRSA colonization surveillance program. It is not intended to diagnose MRSA infection nor to guide or monitor treatment for MRSA infections.   Glucose, capillary     Status: Abnormal   Collection Time: 12/09/15  8:52 PM  Result Value Ref Range   Glucose-Capillary 100 (H) 65 - 99 mg/dL  Glucose, capillary     Status: None   Collection Time: 12/09/15 11:31 PM  Result Value Ref Range   Glucose-Capillary 87 65 - 99 mg/dL  Troponin I     Status: Abnormal   Collection Time: 12/10/15  1:04 AM  Result Value Ref Range   Troponin I 0.63 (HH) <0.031 ng/mL    Comment:        POSSIBLE MYOCARDIAL ISCHEMIA. SERIAL TESTING RECOMMENDED. CRITICAL VALUE NOTED.  VALUE IS CONSISTENT WITH PREVIOUSLY REPORTED AND CALLED VALUE.   T4, free     Status: None   Collection Time: 12/10/15  1:04 AM  Result Value Ref Range   Free T4 1.00 0.61 - 1.12 ng/dL  Glucose, capillary     Status: None   Collection Time: 12/10/15  4:08 AM  Result Value Ref Range   Glucose-Capillary 83 65 - 99 mg/dL  Troponin I     Status: Abnormal   Collection Time: 12/10/15  7:51 AM  Result Value Ref Range   Troponin I 0.64 (HH) <0.031 ng/mL    Comment:        POSSIBLE MYOCARDIAL ISCHEMIA. SERIAL TESTING RECOMMENDED. CRITICAL VALUE NOTED.  VALUE IS CONSISTENT WITH PREVIOUSLY REPORTED AND CALLED VALUE.   CBC     Status: Abnormal   Collection Time: 12/10/15  7:51 AM  Result Value Ref Range   WBC 11.7 (H) 4.0 - 10.5 K/uL   RBC 2.98 (L) 3.87 - 5.11 MIL/uL   Hemoglobin 7.9 (L) 12.0 -  15.0 g/dL   HCT 24.3 (L) 36.0 - 46.0  %   MCV 81.5 78.0 - 100.0 fL   MCH 26.5 26.0 - 34.0 pg   MCHC 32.5 30.0 - 36.0 g/dL   RDW 20.9 (H) 11.5 - 15.5 %   Platelets 245 150 - 400 K/uL  Comprehensive metabolic panel     Status: Abnormal   Collection Time: 12/10/15  7:51 AM  Result Value Ref Range   Sodium 136 135 - 145 mmol/L   Potassium 5.2 (H) 3.5 - 5.1 mmol/L   Chloride 100 (L) 101 - 111 mmol/L   CO2 19 (L) 22 - 32 mmol/L   Glucose, Bld 86 65 - 99 mg/dL   BUN 42 (H) 6 - 20 mg/dL   Creatinine, Ser 8.04 (H) 0.44 - 1.00 mg/dL   Calcium 7.8 (L) 8.9 - 10.3 mg/dL   Total Protein 7.2 6.5 - 8.1 g/dL   Albumin 2.4 (L) 3.5 - 5.0 g/dL   AST 115 (H) 15 - 41 U/L   ALT 31 14 - 54 U/L   Alkaline Phosphatase 56 38 - 126 U/L   Total Bilirubin 1.0 0.3 - 1.2 mg/dL   GFR calc non Af Amer 4 (L) >60 mL/min   GFR calc Af Amer 5 (L) >60 mL/min    Comment: (NOTE) The eGFR has been calculated using the CKD EPI equation. This calculation has not been validated in all clinical situations. eGFR'Annette Roach persistently <60 mL/min signify possible Chronic Kidney Disease.    Anion gap 17 (H) 5 - 15  Glucose, capillary     Status: Abnormal   Collection Time: 12/10/15  8:43 AM  Result Value Ref Range   Glucose-Capillary 58 (L) 65 - 99 mg/dL   Comment 1 Notify Annette Roach    Comment 2 Document in Chart   Glucose, capillary     Status: Abnormal   Collection Time: 12/10/15  9:36 AM  Result Value Ref Range   Glucose-Capillary 180 (H) 65 - 99 mg/dL   Comment 1 Notify Annette Roach    Comment 2 Document in Chart   Troponin I (q 6hr x 3)     Status: Abnormal   Collection Time: 12/10/15 11:28 AM  Result Value Ref Range   Troponin I 0.52 (HH) <0.031 ng/mL    Comment:        POSSIBLE MYOCARDIAL ISCHEMIA. SERIAL TESTING RECOMMENDED. CRITICAL VALUE NOTED.  VALUE IS CONSISTENT WITH PREVIOUSLY REPORTED AND CALLED VALUE.   Glucose, capillary     Status: Abnormal   Collection Time: 12/10/15  1:03 PM  Result Value Ref Range   Glucose-Capillary 134 (H) 65 - 99 mg/dL   Comment  1 Notify Annette Roach    Comment 2 Document in Chart     Dg Chest 2 View  12/09/2015  CLINICAL DATA:  Altered mental status for 2 days.  Confusion EXAM: CHEST  2 VIEW COMPARISON:  07/11/2015 FINDINGS: Right dialysis catheter remains in place, unchanged. Cardiomegaly. There is a small right pleural effusion. Bibasilar airspace opacities are noted which could represent edema or infection. Vascular congestion. No acute bony abnormality. IMPRESSION: Cardiomegaly. Bilateral lower lobe airspace opacities with vascular congestion. Findings could reflect edema or infection. Small right pleural effusion. Electronically Signed   By: Annette Baptise M.D.   On: 12/09/2015 15:29   Ct Head Wo Contrast  12/09/2015  CLINICAL DATA:  Confusion.  Altered mental status. EXAM: CT HEAD WITHOUT CONTRAST TECHNIQUE: Contiguous axial images were obtained from the base of the skull through  the vertex without intravenous contrast. COMPARISON:  MRI 12/25/2014 FINDINGS: There is atrophy and chronic small vessel disease changes. No acute intracranial abnormality. Specifically, no hemorrhage, hydrocephalus, mass lesion, acute infarction, or significant intracranial injury. No acute calvarial abnormality. Extensive mucosal thickening throughout the paranasal sinuses. Polypoid soft tissue within the right nasal cavity. Mastoid air cells are clear. IMPRESSION: No acute intracranial abnormality. Atrophy, chronic microvascular disease. Chronic sinusitis, probable nasal polyposis. Electronically Signed   By: Annette Baptise M.D.   On: 12/09/2015 15:50   Dg Foot Complete Left  12/09/2015  CLINICAL DATA:  Infection, black toes second third and fourth toe EXAM: LEFT FOOT - COMPLETE 3+ VIEW COMPARISON:  11/15/2015 FINDINGS: Three views of the left foot submitted. No acute fracture or subluxation. Vascular calcifications are again noted. There is diffuse osteopenia. No acute fracture or subluxation. Again noted mild narrowing of interphalangeal joints. Stable  plantar and posterior spurring of calcaneus. No bony erosion or periosteal reaction. No definite evidence of bone destruction. IMPRESSION: No acute fracture or subluxation. Stable mild degenerative changes. Diffuse osteopenia. Atherosclerotic vascular calcifications. No periosteal reaction or bony erosion. Again noted plantar and posterior spurring of calcaneus. Electronically Signed   By: Annette Crocker M.D.   On: 12/09/2015 09:17    ROS Blood pressure 169/44, pulse 44, temperature 98.8 F (37.1 C), temperature source Axillary, resp. rate 28, height _0  (1.549 m), SpO2 92 %. Physical Exam Physical Examination: General appearance - uncooperative, stare, goes to sleep Mental status - disoriented to place, time, only moves UE, confused,perseverates Eyes - pupils equal and reactive, extraocular eye movements intact, funduscopic exam abnormal SM retinopathy Mouth - mucous membranes moist, pharynx normal without lesions Neck - adenopathy noted PCL Lymphatics - posterior cervical nodes Chest - decreased air entry noted bilat, rales in bases Heart - irreg, Gr 2/6 m,  Abdomen - soft, nontender, nondistended, no masses or organomegaly Musculoskeletal - no joint tenderness, deformity or swelling Extremities - no DP, R foot cool and dark distally, L gangrenous, cold, black mid foot down Skin - trophic changes distally. Scars both UA  Assessment/Plan: 1 PVD with gangrene on L , for amp.  R not much better. 2 ESRD: needs HD today, will set up 3 Hypertension: not an issue 4. Anemia of ESRD: needs esa and Fe 5. Metabolic Bone Disease: use Vit D 6 Confusion  ?? Leg and some chronic 7 DM low earlier 8 CAD 9 Asthma 10 Renal stones P HD, esa, Fe, ??goals.    Annette Roach L 12/10/2015, 3:13 PM

## 2015-12-11 ENCOUNTER — Encounter (HOSPITAL_COMMUNITY)
Admission: EM | Disposition: A | Discharge status: Hospice | Payer: Self-pay | Source: Home / Self Care | Attending: Internal Medicine

## 2015-12-11 LAB — GLUCOSE, CAPILLARY
GLUCOSE-CAPILLARY: 117 mg/dL — AB (ref 65–99)
Glucose-Capillary: 97 mg/dL (ref 65–99)
Glucose-Capillary: 86 mg/dL (ref 65–99)
Glucose-Capillary: 86 mg/dL (ref 65–99)
Glucose-Capillary: 118 mg/dL — ABNORMAL HIGH (ref 65–99)

## 2015-12-11 LAB — CBC
HCT: 21.4 % — ABNORMAL LOW (ref 36.0–46.0)
HEMOGLOBIN: 7.1 g/dL — AB (ref 12.0–15.0)
MCH: 26.7 pg (ref 26.0–34.0)
MCHC: 33.2 g/dL (ref 30.0–36.0)
MCV: 80.5 fL (ref 78.0–100.0)
PLATELETS: 250 10*3/uL (ref 150–400)
RBC: 2.66 MIL/uL — AB (ref 3.87–5.11)
RDW: 21 % — ABNORMAL HIGH (ref 11.5–15.5)
WBC: 19.1 10*3/uL — ABNORMAL HIGH (ref 4.0–10.5)

## 2015-12-11 LAB — BASIC METABOLIC PANEL
ANION GAP: 17 — AB (ref 5–15)
BUN: 18 mg/dL (ref 6–20)
CHLORIDE: 99 mmol/L — AB (ref 101–111)
CO2: 19 mmol/L — AB (ref 22–32)
CREATININE: 4.35 mg/dL — AB (ref 0.44–1.00)
Calcium: 7.7 mg/dL — ABNORMAL LOW (ref 8.9–10.3)
GFR calc non Af Amer: 8 mL/min — ABNORMAL LOW (ref >60)
GFR, EST AFRICAN AMERICAN: 10 mL/min — AB (ref >60)
GLUCOSE: 101 mg/dL — AB (ref 65–99)
Potassium: 3.9 mmol/L (ref 3.5–5.1)
Sodium: 135 mmol/L (ref 135–145)

## 2015-12-11 LAB — TROPONIN I: Troponin I: 0.55 ng/mL (ref <0.031)

## 2015-12-11 LAB — PROTIME-INR
INR: 1.49 (ref 0.00–1.49)
PROTHROMBIN TIME: 18.1 s — AB (ref 11.6–15.2)

## 2015-12-11 SURGERY — AMPUTATION, ABOVE KNEE
Anesthesia: General | Laterality: Left

## 2015-12-11 MED ORDER — PREGABALIN 25 MG PO CAPS
25.0000 mg | ORAL_CAPSULE | Freq: Every day | ORAL | Status: DC
  Administered 2015-12-11 – 2015-12-13 (×3): 25 mg via ORAL
  Filled 2015-12-11 (×3): qty 1

## 2015-12-11 MED ORDER — ACETAMINOPHEN 325 MG PO TABS
650.0000 mg | ORAL_TABLET | Freq: Three times a day (TID) | ORAL | Status: DC
  Administered 2015-12-11: 325 mg via ORAL
  Administered 2015-12-11 – 2015-12-13 (×5): 650 mg via ORAL
  Filled 2015-12-11 (×6): qty 2

## 2015-12-11 MED ORDER — DARBEPOETIN ALFA 200 MCG/0.4ML IJ SOSY
PREFILLED_SYRINGE | INTRAMUSCULAR | Status: AC
  Filled 2015-12-11: qty 0.4

## 2015-12-11 MED ORDER — ACETAMINOPHEN 325 MG PO TABS: 650.0000 mg | ORAL_TABLET | Freq: Four times a day (QID) | ORAL | Status: DC | PRN

## 2015-12-11 MED ORDER — FENTANYL CITRATE (PF) 100 MCG/2ML IJ SOLN
25.0000 ug | INTRAMUSCULAR | Status: DC | PRN
  Administered 2015-12-11 – 2015-12-12 (×5): 25 ug via INTRAVENOUS
  Filled 2015-12-11 (×4): qty 2

## 2015-12-11 MED ORDER — HEPARIN SODIUM (PORCINE) 5000 UNIT/ML IJ SOLN
5000.0000 [IU] | Freq: Three times a day (TID) | INTRAMUSCULAR | Status: DC
  Administered 2015-12-11 – 2015-12-13 (×7): 5000 [IU] via SUBCUTANEOUS
  Filled 2015-12-11 (×6): qty 1

## 2015-12-11 NOTE — Progress Notes (Signed)
Received report from HD RN.

## 2015-12-11 NOTE — Progress Notes (Signed)
  Date: 12/11/2015  Patient name: Annette Roach  Medical record number: [id number]  Date of birth: 10/15/1927   This patient has been seen and the plan of care was discussed with the house staff. Please see their note for complete details. I concur with their findings with the following additions/corrections: Ms Tash and her daughter were seen on AM rounds. Her other daughter was on speaker phone. Dr D had also spoken to pt and family about utility of amputation. Family & pt since has decided not to have amputation and to attempt one more HD session on the 13th then transition to comfort care with hospice (inpt if possible). Goal is pain control while minimizing sedation so pt can interact with family.  Bartholomew Crews, MD 12/11/2015, 3:27 PM

## 2015-12-11 NOTE — Progress Notes (Signed)
Dialysis treatment terminated early due to insufficient HD access, patient received 3.25 of 4 hour treatment.  2135 mL ultrafiltrated.  1135 mL net fluid removal.  Patient confused, baseline dementia. Lung sounds diminished to ausculation in all fields. Generalized edema. Cardiac: Afib.  Cleansed RIJ catheter with chlorhexidine.  Disconnected lines and flushed ports with saline per protocol.  Ports locked with normal saline and capped per protocol.    Report given to bedside, RN Juanda Crumble.  Treatment terminated early due to arterial access pressures outside of normal limits.  This RN tried flushed RIJ and repositioning patient, as well as slowing BFR and reversing lines.  Physician notified and verbal order received to terminate treatment.  Cath flow dwell ordered, to dwell overnight.

## 2015-12-11 NOTE — Progress Notes (Signed)
Venous chamber clotted with approximately 55 minutes remaining in treatment.  Blood returned and machine stripped, relined.  Tx initiated 10 minutes after blood return.  UF goal adjusted to account for 581mL bolus accompanying blood return and re initiation of treatment.  Will continue to monitor.

## 2015-12-11 NOTE — Consult Note (Signed)
Consultation Note Date: 12/11/2015   Patient Name: Annette Roach  DOB: 1928/03/15  MRN: [id number]  Age / Sex: 82 y.o., female  PCP: Raina Mina, MD Referring Physician: Bartholomew Crews, MD  Reason for Consultation: Establishing goals of care  82 yo female PMH of CAD s/p previous inferior MI, ESRD on HD ~ 4 yrs, HTN, type 2 DM, GIST tumor, peripheral vascular disease, and CHF (EF 45-50% per echo June 2016) who presents today with a 1 month history of ischemic changes to her left foot. She has had numerous procedures to attempt to maintain circulation and avoid amputation. Unfortunately no further options left except for amputation.   Clinical Assessment/Narrative: Full note to follow. Discussed with patient (somewhat confused and only able to say "I need my foot even though I am old") and daughter Annette Roach at bedside, daughter Annette Roach over the phone. Decisions as follow:  - pain management and comfort is priority - NO amputation - 1 more dialysis session tomorrow (if she tolerates) - then transition to hospice facility for pain management at EOL   Contacts/Participants in Discussion: Primary Decision Maker: Annette Roach at bedside    Code Status/Advance Care Planning: DNR    Code Status Orders        Start     Ordered   12/09/15 1656  Do not attempt resuscitation (DNR)   Continuous    Question Answer Comment  In the event of cardiac or respiratory ARREST Do not call a "code blue"   In the event of cardiac or respiratory ARREST Do not perform Intubation, CPR, defibrillation or ACLS   In the event of cardiac or respiratory ARREST Use medication by any route, position, wound care, and other measures to relive pain and suffering. May use oxygen, suction and manual treatment of airway obstruction as needed for comfort.      12/09/15 1655    Code Status History    Date Active Date Inactive Code Status Order ID  Comments User Context   07/11/2015  1:37 PM 07/12/2015  3:15 PM DNR [id number]  Willia Craze, NP Inpatient   03/23/2015  6:40 PM 03/25/2015  9:54 PM DNR [id number]  Corky Sox, MD Inpatient   03/23/2015  2:02 PM 03/23/2015  6:40 PM Full Code [id number]  Corky Sox, MD Inpatient   02/23/2015 12:32 PM 02/25/2015 11:07 PM DNR [id number]  Juluis Mire, MD ED   01/14/2015 10:48 PM 01/16/2015  9:38 PM DNR [id number]  Phillips Grout, MD Inpatient   12/25/2014  6:34 PM 12/29/2014  6:41 PM Full Code [id number]  Erline Hau, MD Inpatient   11/13/2014 11:09 PM 11/20/2014  9:32 PM Full Code [id number]  Etta Quill, DO ED   10/27/2014  5:36 AM 10/28/2014  6:02 PM Full Code [id number]  Ivor Costa, MD Inpatient    Advance Directive Documentation        Most Recent Value   Type of Advance Directive  Out of facility DNR (pink MOST or yellow form)   Pre-existing out of facility DNR order (yellow form or pink MOST form)     "MOST" Form in Place?          Symptom Management:   Pain: Continue Lyrica 25 mg daily. Scheduled Tylenol. Fentanyl 25 mcg every hour prn.  Palliative Prophylaxis:   Bowel Regimen, Delirium Protocol, Frequent Pain Assessment and Turn Reposition  Additional Recommendations (Limitations, Scope, Preferences):  Full Comfort Care  Psycho-social/Spiritual:  Support System: Strong Desire  for further Chaplaincy support:yes Additional Recommendations: Caregiving  Support/Resources, Education on Hospice and Grief/Bereavement Support  Prognosis: < 2 weeks  Discharge Planning: Hospice facility   Chief Complaint/ Primary Diagnoses: Present on Admission:  . Dry gangrene ([place])  I have reviewed the medical record, interviewed the patient and family, and examined the patient. The following aspects are pertinent.  Past Medical History  Diagnosis Date  . COPD (chronic obstructive pulmonary disease) (HCC)     emphysema  . Neuropathy ([place])   . Hyperlipidemia   . Diabetes  mellitus   . Depression   . Anemia   . Shortness of breath   . GERD (gastroesophageal reflux disease)   . Hyperparathyroidism ([place])   . Hypertension     sees Dr. Gilford Rile  . Headache(784.0)   . Arthritis   . CAD (coronary artery disease)     sees Dr. Jenne Campus, [facility] [place], [place]  . Steal syndrome of hand ([place]) jan- 2014    left hand  . Wears glasses   . Wears hearing aid     right  . Presence of surgically created AV shunt for hemodialysis [facility])     old lt upper arm out-rt upper arm shunt in  . Anginal pain ([place])     Dr Raliegh Ip in [place], [place]  . History of kidney stones   . Constipation   . History of blood transfusion   . Asthma     years ago  . Chronic kidney disease     TTH [facility], [place]- Hemo  . Renal insufficiency     patient on dialysis for 3 years (today is 03/22/15  . Pneumonia 03/2015   Social History   Social History  . Marital Status: Married    Spouse Name: N/A  . Number of Children: N/A  . Years of Education: N/A   Social History Main Topics  . Smoking status: Never Smoker   . Smokeless tobacco: Never Used  . Alcohol Use: No  . Drug Use: No  . Sexual Activity: Not Asked   Other Topics Concern  . None   Social History Narrative   Family History  Problem Relation Age of Onset  . Diabetes Mother   . Cancer Father   . Deep vein thrombosis Brother   . Hypertension Brother    Scheduled Meds: . antiseptic oral rinse  7 mL Mouth Rinse q12n4p  . [START ON 12/12/2015] calcitRIOL  2.25 mcg Oral Q T,Th,Sa-HD  . cefUROXime (ZINACEF)  IV  1.5 g Intravenous To SS-Surg  . chlorhexidine  15 mL Mouth Rinse BID  . darbepoetin (ARANESP) injection - DIALYSIS  200 mcg Intravenous Q Tue-HD  . ferric gluconate (FERRLECIT/NULECIT) IV  125 mg Intravenous Q T,Th,Sa-HD  . heparin  5,000 Units Subcutaneous 3 times per day  . sodium chloride flush  3 mL Intravenous Q12H   Continuous Infusions:  PRN Meds:.acetaminophen,  hydrALAZINE Medications Prior to Admission:  Prior to Admission medications   Medication Sig Start Date End Date Taking? Authorizing Provider  acetaminophen (TYLENOL) 325 MG tablet Take 2 tablets (650 mg total) by mouth every 6 (six) hours as needed for mild pain or headache. 07/12/15  Yes Shanker Kristeen Mans, MD  aspirin EC 81 MG tablet Take 81 mg by mouth daily.   Yes Historical Provider, MD  Azelastine HCl 0.15 % SOLN Place 1 spray into both nostrils daily.  10/10/14  Yes Historical Provider, MD  calcium acetate (PHOSLO) 667 MG capsule Take [phone]  mg by mouth 3 (three) times daily with meals. 1 capsule (667mg ) with breakfast and lunch, 2 capsules (1334mg ) with dinner   Yes Historical Provider, MD  cinacalcet (SENSIPAR) 30 MG tablet Take 30 mg by mouth daily.   Yes Historical Provider, MD  clopidogrel (PLAVIX) 75 MG tablet Take 75 mg by mouth daily.   Yes Historical Provider, MD  hydrALAZINE (APRESOLINE) 10 MG tablet Take 10 mg by mouth 2 (two) times daily.   Yes Historical Provider, MD  imatinib (GLEEVEC) 100 MG tablet Take 100 mg by mouth 2 (two) times daily.  02/04/15  Yes Historical Provider, MD  metoprolol succinate (TOPROL-XL) 25 MG 24 hr tablet Take 25 mg by mouth daily.   Yes Historical Provider, MD  mirtazapine (REMERON) 15 MG tablet Take 7.5 mg by mouth at bedtime.    Yes Historical Provider, MD  multivitamin (RENA-VIT) TABS tablet Take 1 tablet by mouth daily.   Yes Historical Provider, MD  nitroGLYCERIN (NITROSTAT) 0.4 MG SL tablet Place 0.4 mg under the tongue every 5 (five) minutes as needed for chest pain.   Yes Historical Provider, MD  ondansetron (ZOFRAN) 4 MG tablet Take 4 mg by mouth every 8 (eight) hours as needed for nausea or vomiting.   Yes Historical Provider, MD  oxyCODONE (OXY IR/ROXICODONE) 5 MG immediate release tablet Take 1 tablet (5 mg total) by mouth every 6 (six) hours as needed for severe pain. 07/12/15  Yes Shanker Kristeen Mans, MD  pantoprazole (PROTONIX) 40 MG  tablet Take 40 mg by mouth daily.   Yes Historical Provider, MD  polyethylene glycol (MIRALAX / GLYCOLAX) packet Take 17 g by mouth daily.   Yes Historical Provider, MD  prochlorperazine (COMPAZINE) 10 MG tablet TAKE 1 TABLET EVERY 6 HOURS AS NEEDED FOR CHEMO NAUSEA-MAY TAKE AROUND THE CLOCK FOR PERSISTENT SYMP 01/30/15  Yes Historical Provider, MD  sennosides-docusate sodium (SENOKOT-S) 8.6-50 MG tablet Take 1 tablet by mouth 2 (two) times daily as needed for constipation.   Yes Historical Provider, MD  VENTOLIN HFA 108 (90 BASE) MCG/ACT inhaler Inhale 2 puffs into the lungs every 4 (four) hours as needed for wheezing or shortness of breath.  10/12/14  Yes Historical Provider, MD   Allergies  Allergen Reactions  . Oxycodone-Acetaminophen Other (See Comments)    confusion    Review of Systems  Unable to perform ROS   Physical Exam  Constitutional: She appears well-developed and well-nourished.  HENT:  Head: Normocephalic and atraumatic.  Cardiovascular: Normal rate.   Respiratory: Effort normal. No accessory muscle usage. No tachypnea. No respiratory distress.  GI: Normal appearance.    Vital Signs: BP 151/81 mmHg  Pulse 47  Temp(Src) 97.4 F (36.3 C) (Axillary)  Resp 32  Ht 5\' 1"  (1.549 m)  Wt 58.5 kg (128 lb 15.5 oz)  BMI 24.38 kg/m2  SpO2 98%  SpO2: SpO2: 98 % O2 Device:SpO2: 98 % O2 Flow Rate: .O2 Flow Rate (L/min): 2 L/min  IO: Intake/output summary:  Intake/Output Summary (Last 24 hours) at 12/11/15 1426 Last data filed at 12/11/15 0057  Gross per 24 hour  Intake    330 ml  Output   1135 ml  Net   -805 ml    LBM:   Baseline Weight: Weight: 61 kg (134 lb 7.7 oz) Most recent weight: Weight: 58.5 kg (128 lb 15.5 oz)      Palliative Assessment/Data:    Additional Data Reviewed:  CBC:    Component Value Date/Time   WBC 19.1* 12/11/2015  0304   HGB 7.1* 12/11/2015 0304   HCT 21.4* 12/11/2015 0304   PLT 250 12/11/2015 0304   MCV 80.5 12/11/2015 0304    NEUTROABS 3.4 03/23/2015 1027   LYMPHSABS 1.0 03/23/2015 1027   MONOABS 0.4 03/23/2015 1027   EOSABS 0.1 03/23/2015 1027   BASOSABS 0.1 03/23/2015 1027   Comprehensive Metabolic Panel:    Component Value Date/Time   NA 135 12/11/2015 0304   K 3.9 12/11/2015 0304   CL 99* 12/11/2015 0304   CO2 19* 12/11/2015 0304   BUN 18 12/11/2015 0304   CREATININE 4.35* 12/11/2015 0304   GLUCOSE 101* 12/11/2015 0304   CALCIUM 7.7* 12/11/2015 0304   AST 115* 12/10/2015 0751   ALT 31 12/10/2015 0751   ALKPHOS 56 12/10/2015 0751   BILITOT 1.0 12/10/2015 0751   PROT 7.2 12/10/2015 0751   ALBUMIN 2.4* 12/10/2015 0751     Time In: 1300 Time Out: 1430 Time Total: 17min Greater than 50%  of this time was spent counseling and coordinating care related to the above assessment and plan.  Signed by: Pershing Proud, NP  Pershing Proud, NP  [id number], 2:26 PM  Please contact Palliative Medicine Team phone at [phone] for questions and concerns.

## 2015-12-11 NOTE — Progress Notes (Signed)
Paged nephrology regarding pt family wanting to speak with MD. Returned call and spoke with on phone.

## 2015-12-11 NOTE — Progress Notes (Signed)
Pt at this point has opted not to proceed with amputation. Would continue with pain control.  Would involve palliative care as the leg is going to be continued pain for her and worsen over time.  Would also consider comfort measures and hospice at this point if she is going to avoid amputation.  Will sign off.  Ruta Hinds, MD [facility] Office: [phone] Pager: [phone]

## 2015-12-11 NOTE — Progress Notes (Signed)
Subjective: BPs elevated overnight in 180s-190s. Hydralazine PRN added. She was unable to complete her HD session as the venous chamber clotted. She is more alert this morning, able to answer some questions but still disoriented. She told me she thinks she is outside. She reports feeling cold. She is able to say her name, birthday, and the year correctly. She did not complain of pain until I touched her left foot. She was unaware that she was scheduled to undergo BKA this afternoon and was very upset by this news. After further discussion with the patient and her daughter and her worsening clinical status (unable to get full HD session, tachypnia, decreased Hgb), we have decided to hold off on surgery and allow palliative care to meet with the patient/family to discuss other options for pain control and GOC.   Objective: Vital signs in last 24 hours: Filed Vitals:   12/11/15 0630 12/11/15 0700 12/11/15 0800 12/11/15 0816  BP: 185/29 175/34 152/45   Pulse: 43 58 53   Temp:    99.5 F (37.5 C)  TempSrc:    Axillary  Resp: 33 34 20   Height:      Weight:      SpO2: 98% 99% 100%    Weight change:   Intake/Output Summary (Last 24 hours) at 12/11/15 0843 Last data filed at 12/11/15 0057  Gross per 24 hour  Intake    540 ml  Output   1135 ml  Net   -595 ml   Physical Exam: General: elderly woman resting in bed, more alert compared to yesterday, answers most questions with 1-2 word answers HEENT: [place]/AT, PERRL, EOMI, mucus membranes moist CV: RRR, no m/g/r Pulm: CTA bilaterally, breaths non-labored Abd: BS+, soft, non-tender Ext: Unable to palpate distal pulses in left foot. Distal pulses 1+ in right foot. Left foot is cold to touch. Neuro: more alert than yesterday, answering more questions  Skin: Left 2nd and 4th toes appear black with evidence of necrosis.  Lab Results: Basic Metabolic Panel:  Recent Labs Lab 12/10/15 0751 12/11/15 0304  NA 136 135  K 5.2* 3.9  CL 100* 99*   CO2 19* 19*  GLUCOSE 86 101*  BUN 42* 18  CREATININE 8.04* 4.35*  CALCIUM 7.8* 7.7*   Liver Function Tests:  Recent Labs Lab 12/09/15 1853 12/10/15 0751  AST 107* 115*  ALT 27 31  ALKPHOS 61 56  BILITOT 0.9 1.0  PROT 7.5 7.2  ALBUMIN 2.4* 2.4*   CBC:  Recent Labs Lab 12/10/15 0751 12/11/15 0304  WBC 11.7* 19.1*  HGB 7.9* 7.1*  HCT 24.3* 21.4*  MCV 81.5 80.5  PLT 245 250   Cardiac Enzymes:  Recent Labs Lab 12/10/15 1128 12/10/15 1639 12/11/15 0304  TROPONINI 0.52* 0.60* 0.55*  CBG:  Recent Labs Lab 12/10/15 0843 12/10/15 0936 12/10/15 1303 12/10/15 1530 12/10/15 1928 12/11/15 0409  GLUCAP 58* 180* 134* 124* 90 97   Hemoglobin A1C:  Recent Labs Lab 12/09/15 1853  HGBA1C 5.9*   Thyroid Function Tests:  Recent Labs Lab 12/09/15 1853 12/10/15 0104  TSH 5.307*  --   FREET4  --  1.00    Micro Results: Recent Results (from the past 240 hour(s))  MRSA PCR Screening     Status: None   Collection Time: 12/09/15  7:08 PM  Result Value Ref Range Status   MRSA by PCR NEGATIVE NEGATIVE Final    Comment:        The GeneXpert MRSA Assay (FDA approved  for NASAL specimens only), is one component of a comprehensive MRSA colonization surveillance program. It is not intended to diagnose MRSA infection nor to guide or monitor treatment for MRSA infections.    Studies/Results: Dg Chest 2 View  12/09/2015  CLINICAL DATA:  Altered mental status for 2 days.  Confusion EXAM: CHEST  2 VIEW COMPARISON:  07/11/2015 FINDINGS: Right dialysis catheter remains in place, unchanged. Cardiomegaly. There is a small right pleural effusion. Bibasilar airspace opacities are noted which could represent edema or infection. Vascular congestion. No acute bony abnormality. IMPRESSION: Cardiomegaly. Bilateral lower lobe airspace opacities with vascular congestion. Findings could reflect edema or infection. Small right pleural effusion. Electronically Signed   By: Rolm Baptise  M.D.   On: 12/09/2015 15:29   Ct Head Wo Contrast  12/09/2015  CLINICAL DATA:  Confusion.  Altered mental status. EXAM: CT HEAD WITHOUT CONTRAST TECHNIQUE: Contiguous axial images were obtained from the base of the skull through the vertex without intravenous contrast. COMPARISON:  MRI 12/25/2014 FINDINGS: There is atrophy and chronic small vessel disease changes. No acute intracranial abnormality. Specifically, no hemorrhage, hydrocephalus, mass lesion, acute infarction, or significant intracranial injury. No acute calvarial abnormality. Extensive mucosal thickening throughout the paranasal sinuses. Polypoid soft tissue within the right nasal cavity. Mastoid air cells are clear. IMPRESSION: No acute intracranial abnormality. Atrophy, chronic microvascular disease. Chronic sinusitis, probable nasal polyposis. Electronically Signed   By: Rolm Baptise M.D.   On: 12/09/2015 15:50   Dg Foot Complete Left  12/09/2015  CLINICAL DATA:  Infection, black toes second third and fourth toe EXAM: LEFT FOOT - COMPLETE 3+ VIEW COMPARISON:  11/15/2015 FINDINGS: Three views of the left foot submitted. No acute fracture or subluxation. Vascular calcifications are again noted. There is diffuse osteopenia. No acute fracture or subluxation. Again noted mild narrowing of interphalangeal joints. Stable plantar and posterior spurring of calcaneus. No bony erosion or periosteal reaction. No definite evidence of bone destruction. IMPRESSION: No acute fracture or subluxation. Stable mild degenerative changes. Diffuse osteopenia. Atherosclerotic vascular calcifications. No periosteal reaction or bony erosion. Again noted plantar and posterior spurring of calcaneus. Electronically Signed   By: Lahoma Crocker M.D.   On: 12/09/2015 09:17   Medications: I have reviewed the patient's current medications. Scheduled Meds: . antiseptic oral rinse  7 mL Mouth Rinse q12n4p  . [START ON 12/12/2015] calcitRIOL  2.25 mcg Oral Q T,Th,Sa-HD  .  cefUROXime (ZINACEF)  IV  1.5 g Intravenous To SS-Surg  . chlorhexidine  15 mL Mouth Rinse BID  . darbepoetin (ARANESP) injection - DIALYSIS  200 mcg Intravenous Q Tue-HD  . ferric gluconate (FERRLECIT/NULECIT) IV  125 mg Intravenous Q T,Th,Sa-HD  . heparin  5,000 Units Subcutaneous 3 times per day  . sodium chloride flush  3 mL Intravenous Q12H   Continuous Infusions:   PRN Meds:. Assessment/Plan:  Ms. Rickenbach is a 82yo woman with PMHx of CAD s/p previous inferior MI, ESRD on HD TThS schedule, HTN, type 2 DM, GIST tumor, peripheral vascular disease, and CHF (EF 45-50% per echo June 2016) presenting with dry gangrene of her left foot and altered mental status.  Dry Gangrene of the Left Foot: She presented with a 1 month history of worsening ischemic changes in her left foot. Underwent angi[facility] at [facility] on 12/02/15 with no success in revascularization. Her clinical status worsened overnight with drop in Hgb, tachypnea, and unable to get full session of HD. She is a high risk surgical candidate and I do not think  she would do well during surgery and post-surgery. The patient is adamant about not wanting her leg to be amputated. She became very upset this morning. We discussed her worsening clinical status with the patient and the patient's daughter and they have agreed to hold off on surgery for now while they make further [place] decisions. She would not do well with pain management with opioids as she becomes delirious. We have asked palliative care to discuss further [place] with the patient/family as well as other pain management options. Discussions of hospice have come up in the past and I think given her poor clinical status that further discussions are appropriate. We also talked with Nephrology and they agree that discussions about continuing HD should be discussed with her poor clinical prognosis.  - Hold surgery. Vascular surgery contacted and aware of worsening clinical status and change in  patient's/family's wishes. - Palliative care consulted, appreciate recommendations  - Continue heparin  - Tylenol for pain as want patient to be alert for [place] discussion   AMS: She is more alert this morning, but still not completely oriented. We will avoid opioid pain medications at this time in order to keep her alert for palliative care discussion.  - Start soft diet as more alert now  - If becomes agitated, try reorientation, sitter  ESRD on HD: On TThSat schedule. Last HD 12/07/15.  - Renal consulted, appreciate recommendations - Hold Phoslo, Sensipar, Rena-vit with AMS  HTN: BPs elevated in [id number] systolic. She is on hydralazine 10 mg BID and toprol-XL 25 mg daily at home.  - Hold home PO meds - Continue Hydralazine 2 mg Q4H PRN SBP > 180  Type 2 DM: Last HbA1c 5.3 on 02/23/15. Repeat A1c 5.9. She is diet controlled.  - Will hold off on ISS since NPO and altered - CBG monitoring   CAD s/p MI: She is on ASA, Toprol-XL, and Plavix at home. Troponins mildly elevated. Will continue to trend. Her EKG does have TWI but these were present on prior EKGs. She does not seem to be having chest pain and reported only pain in her ankle when asked about pain.  - Hold home meds with AMS  Systolic CHF: Last echo 0000000 with EF 45-50%, moderate LVH, basal inferior and basal inferolateral akinesis, mid inferior and mid inferolateral hypokinesis. She does not appear volume overloaded. 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 Tumor not surgically resected likely secondary to her comorbid medical conditions. - Hold Gleevac given AMS  Diet: Soft diet  VTE Ppx: Heparin SQ Dispo: Disposition is deferred at this time, awaiting improvement of current medical problems.  Anticipated discharge in approximately 2-3 day(s).   The patient does have a current PCP Raina Mina, MD) and does need an [facility] hospital follow-up appointment after discharge.  The patient does not  have transportation limitations that hinder transportation to clinic appointments.  .Services Needed at time of discharge: Y = Yes, Blank = No PT:   OT:   RN:   Equipment:   Other:     LOS: 2 days   Juliet Rude, MD 12/11/2015, 8:43 AM

## 2015-12-11 NOTE — Progress Notes (Signed)
Subjective: Interval History: has complaints pain in L>R foot.  Has decided against amp..  Objective: Vital signs in last 24 hours: Temp:  [98.7 F (37.1 C)-99.5 F (37.5 C)] 99.5 F (37.5 C) (04/12 0816) Pulse Rate:  [35-108] 53 (04/12 0800) Resp:  [20-34] 20 (04/12 0800) BP: (145-208)/(26-108) 152/45 mmHg (04/12 0800) SpO2:  [89 %-100 %] 100 % (04/12 0800) Weight:  [58.5 kg (128 lb 15.5 oz)-61 kg (134 lb 7.7 oz)] 58.5 kg (128 lb 15.5 oz) (04/12 0041) Weight change:   Intake/Output from previous day: 04/11 0701 - 04/12 0700 In: 540 [I.V.:430; IV Piggyback:110] Out: 1135  Intake/Output this shift:    General appearance: cooperative, pale, slowed mentation and seems to have some understanding Resp: diminished breath sounds bilaterally Chest wall: RIJ cath Cardio: S1, S2 normal and systolic murmur: holosystolic 2/6, blowing at apex GI: soft, non-tender; bowel sounds normal; no masses,  no organomegaly Extremities: R foot cool, L cold and black  Lab Results:  Recent Labs  12/10/15 0751 12/11/15 0304  WBC 11.7* 19.1*  HGB 7.9* 7.1*  HCT 24.3* 21.4*  PLT 245 250   BMET:  Recent Labs  12/10/15 0751 12/11/15 0304  NA 136 135  K 5.2* 3.9  CL 100* 99*  CO2 19* 19*  GLUCOSE 86 101*  BUN 42* 18  CREATININE 8.04* 4.35*  CALCIUM 7.8* 7.7*   No results for input(s): PTH in the last 72 hours. Iron Studies: No results for input(s): IRON, TIBC, TRANSFERRIN, FERRITIN in the last 72 hours.  Studies/Results: Dg Chest 2 View  12/09/2015  CLINICAL DATA:  Altered mental status for 2 days.  Confusion EXAM: CHEST  2 VIEW COMPARISON:  07/11/2015 FINDINGS: Right dialysis catheter remains in place, unchanged. Cardiomegaly. There is a small right pleural effusion. Bibasilar airspace opacities are noted which could represent edema or infection. Vascular congestion. No acute bony abnormality. IMPRESSION: Cardiomegaly. Bilateral lower lobe airspace opacities with vascular congestion.  Findings could reflect edema or infection. Small right pleural effusion. Electronically Signed   By: Rolm Baptise M.D.   On: 12/09/2015 15:29   Ct Head Wo Contrast  12/09/2015  CLINICAL DATA:  Confusion.  Altered mental status. EXAM: CT HEAD WITHOUT CONTRAST TECHNIQUE: Contiguous axial images were obtained from the base of the skull through the vertex without intravenous contrast. COMPARISON:  MRI 12/25/2014 FINDINGS: There is atrophy and chronic small vessel disease changes. No acute intracranial abnormality. Specifically, no hemorrhage, hydrocephalus, mass lesion, acute infarction, or significant intracranial injury. No acute calvarial abnormality. Extensive mucosal thickening throughout the paranasal sinuses. Polypoid soft tissue within the right nasal cavity. Mastoid air cells are clear. IMPRESSION: No acute intracranial abnormality. Atrophy, chronic microvascular disease. Chronic sinusitis, probable nasal polyposis. Electronically Signed   By: Rolm Baptise M.D.   On: 12/09/2015 15:50   Dg Foot Complete Left  12/09/2015  CLINICAL DATA:  Infection, black toes second third and fourth toe EXAM: LEFT FOOT - COMPLETE 3+ VIEW COMPARISON:  11/15/2015 FINDINGS: Three views of the left foot submitted. No acute fracture or subluxation. Vascular calcifications are again noted. There is diffuse osteopenia. No acute fracture or subluxation. Again noted mild narrowing of interphalangeal joints. Stable plantar and posterior spurring of calcaneus. No bony erosion or periosteal reaction. No definite evidence of bone destruction. IMPRESSION: No acute fracture or subluxation. Stable mild degenerative changes. Diffuse osteopenia. Atherosclerotic vascular calcifications. No periosteal reaction or bony erosion. Again noted plantar and posterior spurring of calcaneus. Electronically Signed   By: Lahoma Crocker  M.D.   On: 12/09/2015 09:17    I have reviewed the patient's current medications.  Assessment/Plan: 1 ESRD shortened  tx with cath malfunction yest 2 anemia esa, Fe 3 HPTH vit D 4 PVD  ?amp decision, discussed 5 CAD 6 AMS actually better P have discussed goals, poss stop HD, will cont to counsel.    LOS: 2 days   Christiann Hagerty L 12/11/2015,9:03 AM

## 2015-12-11 NOTE — Progress Notes (Signed)
Palliative:  Full note to follow. Discussed with patient (somewhat confused and only able to say "I need my foot even though I am old") and daughter Arbie Cookey at bedside, daughter Olin Hauser over the phone. Decisions as follow:  - pain management and comfort is priority - NO amputation - 1 more dialysis session tomorrow (if she tolerates) - then transition to hospice facility for pain management at [place], NP Palliative Medicine Team Pager # [phone] (M-F 8a-5p) Team Phone # [phone] (Nights/Weekends)

## 2015-12-11 NOTE — Progress Notes (Signed)
Re: Order for Ankle Brachial Index.  Due to current condition of patient and transition to palliative care, ABI is on hold. Please reorder if ABI needed.    Land[facility] lab

## 2015-12-11 NOTE — Progress Notes (Signed)
Subjective: Annette Roach underwent dialysis last night, but was only able to complete 3 hours worth due to arterial access issues.  This morning she was able to interact with her daughter and the team.  Her daughter explained that she was going to be going to surgery to have her leg removed and she broke into tears.  She repeatedly voiced not wanting to lose her leg.  We explained to Annette Roach and her daughter about options if she decides against amputation.  They requested palliative care team consult and for more time to think about it.  Annette Roach complained of pain in her left leg, and reported that her right leg was starting to have worsening pain.   Objective: Vital signs in last 24 hours: Filed Vitals:   12/11/15 0630 12/11/15 0700 12/11/15 0800 12/11/15 0816  BP: 185/29 175/34 152/45   Pulse: 43 58 53   Temp:    99.5 F (37.5 C)  TempSrc:    Axillary  Resp: 33 34 20   Height:      Weight:      SpO2: 98% 99% 100%    Weight change:   Intake/Output Summary (Last 24 hours) at 12/11/15 1130 Last data filed at 12/11/15 0057  Gross per 24 hour  Intake    540 ml  Output   1135 ml  Net   -595 ml   General appearance: alert, cooperative, mild distress and slowed mentation Head: Normocephalic, without obvious abnormality, atraumatic Lungs: clear to auscultation bilaterally Heart: irregularly irregular rhythm Abdomen: soft, non-tender; bowel sounds normal; no masses,  no organomegaly Extremities: Right leg is warm and well perfused, Left leg is cold with necrotic 2nd toe and worsening black color on big toe Pulses: 1+ radial pulses, 1+ DP pulse in R foot, no palpable DP pulse in left foot Neurologic: Mental status: Alert, oriented, thought content appropriate  Lab Results: Basic Metabolic Panel:  Recent Labs Lab 12/10/15 0751 12/11/15 0304  NA 136 135  K 5.2* 3.9  CL 100* 99*  CO2 19* 19*  GLUCOSE 86 101*  BUN 42* 18  CREATININE 8.04* 4.35*  CALCIUM 7.8* 7.7*   Liver  Function Tests:  Recent Labs Lab 12/09/15 1853 12/10/15 0751  AST 107* 115*  ALT 27 31  ALKPHOS 61 56  BILITOT 0.9 1.0  PROT 7.5 7.2  ALBUMIN 2.4* 2.4*   CBC:  Recent Labs Lab 12/10/15 0751 12/11/15 0304  WBC 11.7* 19.1*  HGB 7.9* 7.1*  HCT 24.3* 21.4*  MCV 81.5 80.5  PLT 245 250   Cardiac Enzymes:  Recent Labs Lab 12/10/15 1128 12/10/15 1639 12/11/15 0304  TROPONINI 0.52* 0.60* 0.55*    Recent Labs Lab 12/10/15 0936 12/10/15 1303 12/10/15 1530 12/10/15 1928 12/11/15 0409 12/11/15 0826  GLUCAP 180* 134* 124* 90 97 86    Recent Labs Lab 12/09/15 1853 12/10/15 0104  TSH 5.307*  --   FREET4  --  1.00   Coagulation:  Recent Labs Lab 12/11/15 0304  LABPROT 18.1*  INR 1.49     Medications: I have reviewed the patient's current medications. Scheduled Meds: . antiseptic oral rinse  7 mL Mouth Rinse q12n4p  . [START ON 12/12/2015] calcitRIOL  2.25 mcg Oral Q T,Th,Sa-HD  . cefUROXime (ZINACEF)  IV  1.5 g Intravenous To SS-Surg  . chlorhexidine  15 mL Mouth Rinse BID  . darbepoetin (ARANESP) injection - DIALYSIS  200 mcg Intravenous Q Tue-HD  . ferric gluconate (FERRLECIT/NULECIT) IV  125 mg Intravenous Q  T,Th,Sa-HD  . sodium chloride flush  3 mL Intravenous Q12H   Continuous Infusions:  PRN Meds:.hydrALAZINE Assessment/Plan: Active Problems:   Dry gangrene (HCC)   Pressure ulcer  Annette Roach is an 82 y.o. Chronically ill appearing woman, with a history of ESRD on HD, CHF, PAD, gastric GIST, and type 2 DM who presents to the ED with left lower extremity pain, a gangrenous and cold left foot, with improving mental status now alert and oriented to situation.  Dry gangrene of L lower leg: Patient has known severe peripheral artery disease that has failed multiple treatments. Left leg is cold and with necrotic 2nd toe. Was awake and oriented to situation this morning and voiced repeatedly that she did not want to have her leg removed.  She was  visibly upset about this possibility.  We had an extensive discussion the patient and her daughter about the risks of surgery given her chronic illness and her worsening disease state (now with Hb 7.1, WBC 19.1, INR 1.5 and in a-fib), and explained that she is a very poor candidate for surgery.  They requested more time to make the decision, and also requested to speak to the palliative care team.  Dr. Oneida Alar, the vascular surgeon, was updated and agreed to cancel the surgery today until the patient agreed that was her preferred treatment approach.   --Surgery for AKA of L LE cancelled --Continue discussion of risks and benefits of AKA given worsening clinical condition --Revisit options after palliative care has seen and spoken with the patient  Altered Mental Status: Improving.  Patient oriented to situation.  Given the likelihood her confusion was caused by opioids, we will hold off on pain medication until after the palliative care consult.    ESRD on hemodialysis: Gets hemodialysis on T/Th/Sat. Only received 3 hours of HD last night.  Nephrology explained worsening clinical picture to daughter who voiced understanding.   --Nephrology following.   --Hold home Phoslo, and cinacalcet   Type II DM: Currently diet controlled.  --Follow blood glucose with daily BMP  Anemia: Hb now 7.1 Baseline around 9, but has not had recent CBC in past 3 months at [facility]. Likely secondary to renal disease and hemodialysis. Will await results of palliative care meeting to decide on approach to worsening anemia. 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 Lynnae January spoke extensively with the daughter yesterday about goals of care (see Dr. Zenovia Jarred note for more details).  The daughter is aware of her mother's deteriorating condition.  After her mother voiced her disapproval for amputation of her leg, the surgery was cancelled.  Palliative  care was consulted to weigh in on pain control options if the patient ultimatley decides against surgery.    --Palliative to see patient today  Hypertension/CHF: BP in ED was as high as 181/49. Elevated troponin w/ ECG denoting RV Hypertrophy and RV strain pattern.  --Hydralazine PRN  Hypoglylcemia: Had decreasing CBG overnight with one reading of 58 this morning. Was given D5 and level improved to 180 mg/dL --D5 NS at 16mL/hr for 12 hours  Diet: Increase diet to renal diet.  DVT PPX: --Heparin 5000 U  Dispo: Anticipate continued hospitalization for 3-5 days.  This is a Careers information officer Note.  The care of the patient was discussed with Dr. Arcelia Jew and the assessment and plan formulated with their assistance.  Please see their attached note for official documentation of the daily encounter.  LOS: 2 days   Burt Ek, Med Student 12/11/2015, 11:30 AM

## 2015-12-12 DIAGNOSIS — Z515 Encounter for palliative care: Secondary | ICD-10-CM

## 2015-12-12 DIAGNOSIS — Z992 Dependence on renal dialysis: Secondary | ICD-10-CM

## 2015-12-12 DIAGNOSIS — I998 Other disorder of circulatory system: Secondary | ICD-10-CM | POA: Insufficient documentation

## 2015-12-12 DIAGNOSIS — I96 Gangrene, not elsewhere classified: Secondary | ICD-10-CM

## 2015-12-12 DIAGNOSIS — N186 End stage renal disease: Secondary | ICD-10-CM

## 2015-12-12 DIAGNOSIS — I739 Peripheral vascular disease, unspecified: Secondary | ICD-10-CM

## 2015-12-12 LAB — GLUCOSE, CAPILLARY
GLUCOSE-CAPILLARY: 90 mg/dL (ref 65–99)
GLUCOSE-CAPILLARY: 157 mg/dL — AB (ref 65–99)
Glucose-Capillary: 100 mg/dL — ABNORMAL HIGH (ref 65–99)
Glucose-Capillary: 103 mg/dL — ABNORMAL HIGH (ref 65–99)

## 2015-12-12 MED ORDER — OXYCODONE HCL 20 MG/ML PO CONC: 5.0000 mg | ORAL | Status: DC | PRN

## 2015-12-12 MED ORDER — OXYCODONE HCL 5 MG/5ML PO SOLN
5.0000 mg | ORAL | Status: DC | PRN
  Administered 2015-12-12 – 2015-12-13 (×5): 5 mg
  Filled 2015-12-12: qty 5
  Filled 2015-12-12: qty 10
  Filled 2015-12-12 (×3): qty 5

## 2015-12-12 MED ORDER — FENTANYL CITRATE (PF) 100 MCG/2ML IJ SOLN
INTRAMUSCULAR | Status: AC
  Filled 2015-12-12: qty 2

## 2015-12-12 MED ORDER — ALPRAZOLAM 0.25 MG PO TABS
0.2500 mg | ORAL_TABLET | Freq: Three times a day (TID) | ORAL | Status: DC | PRN
  Administered 2015-12-12: 0.25 mg via ORAL
  Filled 2015-12-12: qty 1

## 2015-12-12 MED ORDER — FENTANYL CITRATE (PF) 100 MCG/2ML IJ SOLN
25.0000 ug | INTRAMUSCULAR | Status: DC | PRN
  Filled 2015-12-12: qty 2

## 2015-12-12 MED ORDER — ALTEPLASE 50 MG IV SOLR
4.0000 mg | Freq: Once | INTRAVENOUS | Status: AC
  Administered 2015-12-12: 4 mg
  Filled 2015-12-12: qty 4

## 2015-12-12 NOTE — Care Management Note (Signed)
Case Management Note  Patient Details  Name: Annette Roach MRN: [id number] Date of Birth: 12-09-1927  Subjective/Objective:    [facility] decided [facility],CSW referral.                 Action/Plan:   Expected Discharge Date:                  Expected Discharge Plan:  [place]  In-House Referral:  Clinical Social Work  Discharge planning Services  CM Consult  Post Acute Care Choice:    Choice offered to:     DME Arranged:    DME Agency:     HH Arranged:    [place] Agency:     Status of Service:  Completed, signed off  Medicare Important Message Given:    Date Medicare IM Given:    Medicare IM give by:    Date Additional Medicare IM Given:    Additional Medicare Important Message give by:     If discussed at [place] of Stay Meetings, dates discussed:    Additional Comments:  Zenon Mayo, RN 12/12/2015, 4:42 PM

## 2015-12-12 NOTE — Progress Notes (Signed)
Daily Progress Note   Patient Name: Annette Roach       Date: 12/12/2015 DOB: 03/27/1928  Age: 82 y.o. MRN#: [id number] Attending Physician: Bartholomew Crews, MD Primary Care Physician: Gilford Rile, MD Admit Date: 12/09/2015  Reason for Consultation/Follow-up: Establishing goals of care and Pain control  Subjective: Annette Roach is doing well today. She is pleasantly confused. Long discussion with Annette Roach's daughter, Annette Roach, along with medical student, Annette Roach, regarding Annette Roach's questions. Annette Roach is questioning the weight of the decisions she has made such as stopping dialysis and transition to hospice vs going home. After reassurance and discussion Annette Roach continues to agree with the previous plan that today is the last HD session and will continue with transition to hospice facility. Annette Roach just needs reassurance and support. All questions/concerns addressed.   Also Annette Roach's pain has been mostly controlled well with current medications - worsening pain yesterday evening not managed well with fentanyl so will increase range in case higher dose needed to achieve comfort.   Interval Events: 4/13 Last HD treatment  Length of Stay: 3 days  Current Medications: Scheduled Meds:  . acetaminophen  650 mg Oral TID  . antiseptic oral rinse  7 mL Mouth Rinse q12n4p  . calcitRIOL  2.25 mcg Oral Q T,Th,Sa-HD  . chlorhexidine  15 mL Mouth Rinse BID  . darbepoetin (ARANESP) injection - DIALYSIS  200 mcg Intravenous Q Tue-HD  . ferric gluconate (FERRLECIT/NULECIT) IV  125 mg Intravenous Q T,Th,Sa-HD  . heparin  5,000 Units Subcutaneous 3 times per day  . pregabalin  25 mg Oral Daily  . sodium chloride flush  3 mL Intravenous Q12H    Continuous Infusions:    PRN Meds: ALPRAZolam, fentaNYL  (SUBLIMAZE) injection, hydrALAZINE, oxyCODONE  Physical Exam: Physical Exam              Vital Signs: BP 180/30 mmHg  Pulse 73  Temp(Src) 99 F (37.2 C) (Oral)  Resp 11  Ht 5\' 1"  (1.549 m)  Wt 57 kg (125 lb 10.6 oz)  BMI 23.76 kg/m2  SpO2 100% SpO2: SpO2: 100 % O2 Device: O2 Device: Not Delivered O2 Flow Rate: O2 Flow Rate (L/min): 1 L/min  Intake/output summary:  Intake/Output Summary (Last 24 hours) at 12/12/15 1943 Last data filed at 12/12/15 1123  Gross per 24  hour  Intake    110 ml  Output   1214 ml  Net  -1104 ml   LBM:   Baseline Weight: Weight: 61 kg (134 lb 7.7 oz) Most recent weight: Weight: 57 kg (125 lb 10.6 oz)       Palliative Assessment/Data:   Additional Data Reviewed: CBC    Component Value Date/Time   WBC 19.1* 12/11/2015 0304   RBC 2.66* 12/11/2015 0304   RBC 3.44* 02/23/2015 1500   HGB 7.1* 12/11/2015 0304   HCT 21.4* 12/11/2015 0304   PLT 250 12/11/2015 0304   MCV 80.5 12/11/2015 0304   MCH 26.7 12/11/2015 0304   MCHC 33.2 12/11/2015 0304   RDW 21.0* 12/11/2015 0304   LYMPHSABS 1.0 03/23/2015 1027   MONOABS 0.4 03/23/2015 1027   EOSABS 0.1 03/23/2015 1027   BASOSABS 0.1 03/23/2015 1027    CMP     Component Value Date/Time   NA 135 12/11/2015 0304   K 3.9 12/11/2015 0304   CL 99* 12/11/2015 0304   CO2 19* 12/11/2015 0304   GLUCOSE 101* 12/11/2015 0304   BUN 18 12/11/2015 0304   CREATININE 4.35* 12/11/2015 0304   CALCIUM 7.7* 12/11/2015 0304   PROT 7.2 12/10/2015 0751   ALBUMIN 2.4* 12/10/2015 0751   AST 115* 12/10/2015 0751   ALT 31 12/10/2015 0751   ALKPHOS 56 12/10/2015 0751   BILITOT 1.0 12/10/2015 0751   GFRNONAA 8* 12/11/2015 0304   GFRAA 10* 12/11/2015 0304       Problem List:  Patient Active Problem List   Diagnosis Date Noted  . Dry gangrene ([place]) 12/09/2015  . Pressure ulcer 12/09/2015  . History of pulmonary embolism 07/11/2015  . S/P tooth extraction 07/11/2015  . Constipation 07/11/2015  . Chronic  combined systolic and diastolic CHF (congestive heart failure) ([place]) 07/11/2015  . Prolonged Q-T interval on ECG 07/11/2015  . Acute bronchitis   . ESRD on dialysis ([place])   . Pulmonary vascular congestion   . Elevated troponin 02/23/2015  . Acute pulmonary embolism ([place]) 02/23/2015  . Cough 02/23/2015  . Hypoxia   . Gastric cancer ([place])   . Pleural effusion, right   . Sepsis due to pneumonia ([place])   . Anemia in chronic kidney disease   . TIA (transient ischemic attack) 12/25/2014  . Sepsis ([place]) 12/25/2014  . Acute encephalopathy 12/25/2014  . Dialysis complication ([place])   . Right sided weakness   . Altered mental status   . Encephalopathy   . Bradycardia   . HCAP (healthcare-associated pneumonia) 11/13/2014  . Vasovagal syncope 11/13/2014  . Leukocytosis 11/13/2014  . SOB (shortness of breath) 10/27/2014  . Chest pain 10/27/2014  . Fever 10/27/2014  . CHF (congestive heart failure) ([place]) 10/27/2014  . Abdominal pain 10/27/2014  . Depression   . GERD (gastroesophageal reflux disease)   . Hypertension   . CAD (coronary artery disease)   . History of kidney stones   . Asthma   . Steal syndrome of hand ([place]) 08/31/2012  . Swelling 07/18/2012  . End stage renal disease on dialysis [facility]) 12/10/2011     Palliative Care Assessment & Plan    1.Code Status:  DNR    Code Status Orders        Start     Ordered   12/09/15 1656  Do not attempt resuscitation (DNR)   Continuous    Question Answer Comment  In the event of cardiac or respiratory ARREST Do not call a "code blue"  In the event of cardiac or respiratory ARREST Do not perform Intubation, CPR, defibrillation or ACLS   In the event of cardiac or respiratory ARREST Use medication by any route, position, wound care, and other measures to relive pain and suffering. May use oxygen, suction and manual treatment of airway obstruction as needed for comfort.      12/09/15 1655    Code Status History    Date Active  Date Inactive Code Status Order ID Comments User Context   07/11/2015  1:37 PM 07/12/2015  3:15 PM DNR [id number]  Willia Craze, NP Inpatient   03/23/2015  6:40 PM 03/25/2015  9:54 PM DNR [id number]  Corky Sox, MD Inpatient   03/23/2015  2:02 PM 03/23/2015  6:40 PM Full Code [id number]  Corky Sox, MD Inpatient   02/23/2015 12:32 PM 02/25/2015 11:07 PM DNR [id number]  Juluis Mire, MD ED   01/14/2015 10:48 PM 01/16/2015  9:38 PM DNR [id number]  Phillips Grout, MD Inpatient   12/25/2014  6:34 PM 12/29/2014  6:41 PM Full Code [id number]  Erline Hau, MD Inpatient   11/13/2014 11:09 PM 11/20/2014  9:32 PM Full Code [id number]  Etta Quill, DO ED   10/27/2014  5:36 AM 10/28/2014  6:02 PM Full Code [id number]  Ivor Costa, MD Inpatient    Advance Directive Documentation        Most Recent Value   Type of Advance Directive  Out of facility DNR (pink MOST or yellow form)   Pre-existing out of facility DNR order (yellow form or pink MOST form)     "MOST" Form in Place?         2. Goals of Care/Additional Recommendations:  Comfort care. Transition to hospice facility.   Limitations on Scope of Treatment: Full Comfort Care  Desire for further Chaplaincy support:yes  Psycho-social Needs: Caregiving  Support/Resources, Education on Hospice and Grief/Bereavement Support  3. Symptom Management:      1. Pain: Continue Lyrica 25 mg daily. Scheduled Tylenol. Fentanyl 25-50 mcg every hour prn. Also added oxyfast 5-10 mg every 2 hours prn (IV was lost).   4. Palliative Prophylaxis:   Bowel Regimen, Delirium Protocol, Frequent Pain Assessment and Turn Reposition  5. Prognosis: < 2 weeks  6. Discharge Planning:  Hospice facility    Thank you for allowing the Palliative Medicine Team to assist in the care of this patient.   Time In: 1450 Time Out: 1530 Total Time 36min Prolonged Time Billed  no         Pershing Proud, NP  [id number], 7:43 PM  Please contact Palliative Medicine  Team phone at [phone] for questions and concerns.

## 2015-12-12 NOTE — Progress Notes (Signed)
  Subjective: Annette Roach was doing well this morning.  Pain was well controlled.  Confusion seems to be at baseline.  Spoke with daughter extensively about hospice.  She has still decided on hospice care, but expressed that it is just taking longer for her heart to come to terms with it.    Objective: Vital signs in last 24 hours: Filed Vitals:   12/12/15 0930 12/12/15 1000 12/12/15 1030 12/12/15 1123  BP: 180/46 188/36 184/46 174/30  Pulse: 64 67 77 73  Temp:    98 F (36.7 C)  TempSrc:    Oral  Resp:    26  Height:      Weight:    57 kg (125 lb 10.6 oz)  SpO2:    100%   Weight change:   Intake/Output Summary (Last 24 hours) at 12/12/15 1401 Last data filed at 12/12/15 1123  Gross per 24 hour  Intake      0 ml  Output   1214 ml  Net  -1214 ml   General appearance: no distress and slowed mentation Head: Normocephalic, without obvious abnormality, atraumatic Lungs: clear to auscultation bilaterally Heart: regularly irregular rhythm Lab Results: No labs today  Continuous Infusions:  PRN Meds:.fentaNYL (SUBLIMAZE) injection, hydrALAZINE Assessment/Plan: Active Problems:   Dry gangrene (HCC)   Pressure ulcer  Annette Roach is an 82 y.o. Chronically ill appearing woman, with a history of ESRD on HD, CHF, PAD, gastric GIST, and type 2 DM who presents to the ED with left lower extremity pain, a gangrenous and cold left foot, with improving mental status now alert and oriented to situation.  Dry gangrene of L lower leg: Surgery was cancelled due to Annette Roach's desire to keep her leg.   --Comfort care only; palliative medicine managing pain relief regimen  --Fentanyl 50 mg IV q1h PRN for pain --Lyrica 25 mg PO daily for neuropathic pain --APAP 650 mg, TID  ESRD on hemodialysis: Daughter (and POA) decided that today will be the patient's last day of HD.   --Nephrology following.  --Stop HD after today's session --d/c other renal medications  Type II DM: Now comfort care  only  Anemia: Comfort care only. --no daily CBC  Gastric GIST: Comfort care only --D/C home imatinib  Advanced care planning: Palliative was consulted yesterday and the family decided on palliative care with discharge to hospice.  Patient is awaiting bed at hospice in [place].    --Palliative following and managing pain medication  Dispo: Awaiting bed at hospice.  Likely tomorrow.    This is a Careers information officer Note.  The care of the patient was discussed with Dr. Lynnae January and the assessment and plan formulated with their assistance.  Please see their attached note for official documentation of the daily encounter.   LOS: 3 days   Burt Ek, Med Student 12/12/2015, 2:01 PM

## 2015-12-12 NOTE — Procedures (Signed)
I was present at this session.  I have reviewed the session itself and made appropriate changes.  HD via PC , tol well, bp ok.  Kennetta Pavlovic L 4/13/20178:08 AM

## 2015-12-12 NOTE — Progress Notes (Signed)
Subjective: Seen in HD. Doing okay, having some pain.   Objective: Vital signs in last 24 hours: Filed Vitals:   12/12/15 0930 12/12/15 1000 12/12/15 1030 12/12/15 1123  BP: 180/46 188/36 184/46 174/30  Pulse: 64 67 77 73  Temp:    98 F (36.7 C)  TempSrc:    Oral  Resp:    26  Height:      Weight:    125 lb 10.6 oz (57 kg)  SpO2:    100%   Weight change:   Intake/Output Summary (Last 24 hours) at 12/12/15 1345 Last data filed at 12/12/15 1123  Gross per 24 hour  Intake      0 ml  Output   1214 ml  Net  -1214 ml   Physical Exam: General: Elderly female, alert, cooperative, NAD. HEENT: PERRL, EOMI. Moist mucus membranes. Neck: Full range of motion without pain, supple, no lymphadenopathy or carotid bruits Lungs: Clear to ascultation bilaterally, normal work of respiration, no wheezes, rales, rhonchi Heart: RRR, no murmurs, gallops, or rubs Abdomen: Soft, non-tender, non-distended, BS + Extremities: No cyanosis, clubbing. Absent distal pulses. LLE with black appearing toes, dry gangrene.  Neurologic: Alert & oriented, cranial nerves II-XII intact, strength grossly intact, sensation intact to light touch   Lab Results: Basic Metabolic Panel:  Recent Labs Lab 12/10/15 0751 12/11/15 0304  NA 136 135  K 5.2* 3.9  CL 100* 99*  CO2 19* 19*  GLUCOSE 86 101*  BUN 42* 18  CREATININE 8.04* 4.35*  CALCIUM 7.8* 7.7*   Liver Function Tests:  Recent Labs Lab 12/09/15 1853 12/10/15 0751  AST 107* 115*  ALT 27 31  ALKPHOS 61 56  BILITOT 0.9 1.0  PROT 7.5 7.2  ALBUMIN 2.4* 2.4*   CBC:  Recent Labs Lab 12/10/15 0751 12/11/15 0304  WBC 11.7* 19.1*  HGB 7.9* 7.1*  HCT 24.3* 21.4*  MCV 81.5 80.5  PLT 245 250   Cardiac Enzymes:  Recent Labs Lab 12/10/15 1128 12/10/15 1639 12/11/15 0304  TROPONINI 0.52* 0.60* 0.55*  CBG:  Recent Labs Lab 12/11/15 0826 12/11/15 1154 12/11/15 1652 12/11/15 1920 12/11/15 2330 12/12/15 0331  GLUCAP 86 86 117*  118* 100* 103*   Hemoglobin A1C:  Recent Labs Lab 12/09/15 1853  HGBA1C 5.9*   Thyroid Function Tests:  Recent Labs Lab 12/09/15 1853 12/10/15 0104  TSH 5.307*  --   FREET4  --  1.00    Micro Results: Recent Results (from the past 240 hour(s))  MRSA PCR Screening     Status: None   Collection Time: 12/09/15  7:08 PM  Result Value Ref Range Status   MRSA by PCR NEGATIVE NEGATIVE Final    Comment:        The GeneXpert MRSA Assay (FDA approved for NASAL specimens only), is one component of a comprehensive MRSA colonization surveillance program. It is not intended to diagnose MRSA infection nor to guide or monitor treatment for MRSA infections.     Medications: I have reviewed the patient's current medications. Scheduled Meds: . acetaminophen  650 mg Oral TID  . antiseptic oral rinse  7 mL Mouth Rinse q12n4p  . calcitRIOL  2.25 mcg Oral Q T,Th,Sa-HD  . chlorhexidine  15 mL Mouth Rinse BID  . darbepoetin (ARANESP) injection - DIALYSIS  200 mcg Intravenous Q Tue-HD  . ferric gluconate (FERRLECIT/NULECIT) IV  125 mg Intravenous Q T,Th,Sa-HD  . heparin  5,000 Units Subcutaneous 3 times per day  . pregabalin  25  mg Oral Daily  . sodium chloride flush  3 mL Intravenous Q12H   Continuous Infusions:   PRN Meds:.  Assessment/Plan: [name] is a 82 y/o woman with PMHx of CAD s/p previous inferior MI, ESRD on HD TThS schedule, HTN, type 2 DM, GIST tumor, peripheral vascular disease, and CHF (EF 45-50% per echo June 2016) presenting with dry gangrene of her left foot and altered mental status.  Dry Gangrene of the Left Foot: No significant changes. Patient to be discharged to Hospice when bed available.  -Hospice as above -Palliative care consulted, appreciate recommendations  -Tylenol, Fentanyl, Lyrica for pain  AMS: No significant changes.  -Soft diet -Hospice as above  ESRD on HD: On TTS schedule. Last HD today.  -Per renal, catheter okay to use for med  administration, etc at hospice.   HTN: BP elevated into systolic [id number].  -Continue Hydralazine 2 mg Q4H PRN SBP > 180  Type 2 DM: Stable. Not eating much.  -CBG's q4h   CAD s/p MI: She is on ASA, Toprol-XL, and Plavix at home. Troponins mildly elevated. Will continue to trend. Her EKG does have TWI but these were present on prior EKGs. She does not seem to be having chest pain and reported only pain in her ankle when asked about pain.  -Hold home meds with AMS  Systolic CHF: She does not appear volume overloaded. - Hold home PO meds given AMS, Hospice  GIST tumor: No further management -Hold Gleevec  VTE/PE PPx: Heparin C-Road  Dispo: Disposition is deferred at this time, awaiting improvement of current medical problems.  Anticipated discharge in approximately 1-2 day(s).   The patient does have a current PCP Raina Mina, MD) and does need an [facility] hospital follow-up appointment after discharge.  The patient does not have transportation limitations that hinder transportation to clinic appointments.  .Services Needed at time of discharge: Y = Yes, Blank = No PT:   OT:   RN:   Equipment:   Other:     LOS: 3 days   Corky Sox, MD 12/12/2015, 1:45 PM

## 2015-12-12 NOTE — Clinical Social Work Note (Signed)
Clinical Social Work Assessment  Patient Details  Name: Annette Roach MRN: [id number] Date of Birth: 20-Jul-1928  Date of referral:  12/12/15               Reason for consult:  Facility Placement, Discharge Planning                Permission sought to share information with:  Facility Sport and exercise psychologist, Family Supports Permission granted to share information::  Yes, Verbal Permission Granted  Name::     Annette Roach  Agency::  [facility]  Relationship::  Daughter  Contact Information:  [phone]  Housing/Transportation Living arrangements for the past 2 months:  [place] of Information:  Adult Children Patient Interpreter Needed:  None Criminal Activity/Legal Involvement Pertinent to Current Situation/Hospitalization:  No - Comment as needed Significant Relationships:  Adult Children Lives with:  Adult Children Do you feel safe going back to the place where you live?  Yes Need for family participation in patient care:  Yes (Comment)  Care giving concerns:  Patient requiring hospice.   Social Worker assessment / plan:  CSW met with patient. Daughter was present at bedside. CSW introduced role and discussed discharge planning and recommendation for residential Hospice. Patient came from home where she lives with her daughter. Her daughter's only concerns were what questions to ask when touring the facility. Patient did not speak with CSW but had her eyes open and was looking in that direction. CSW provided patient's daughter with a few questions to ask while touring the facility tomorrow. Patient will be discharged to [facility] tomorrow.  Employment status:  Retired Forensic scientist:  Medicare PT Recommendations:  Not assessed at this time [place] / Referral to community resources:   [facility])  Patient/Family's Response to care:  Patient was not fully oriented to discussion. Daughter was open to discussing residential  hospice.  Patient/Family's Understanding of and Emotional Response to Diagnosis, Current Treatment, and Prognosis:  Patient did not communicate with CSW  Emotional Assessment Appearance:  Appears stated age Attitude/Demeanor/Rapport:  Unable to Assess Affect (typically observed):  Unable to Assess Orientation:  Oriented to Self Alcohol / Substance use:  Never Used Psych involvement (Current and /or in the community):  No (Comment)  Discharge Needs  Concerns to be addressed:  Care Coordination, Other (Comment Required (Concerns about questions to ask while touring facility) Readmission within the last 30 days:  No Current discharge risk:  Terminally ill Barriers to Discharge:  Continued Medical Work up   Candie Chroman, LCSW 12/12/2015, 4:23 PM

## 2015-12-12 NOTE — Progress Notes (Signed)
Report given to HD nurse  

## 2015-12-12 NOTE — Progress Notes (Signed)
Subjective: Interval History: has complaints pain in feet L>R.  Objective: Vital signs in last 24 hours: Temp:  [97.4 F (36.3 C)-99.5 F (37.5 C)] 97.5 F (36.4 C) (04/13 0329) Pulse Rate:  [34-106] 95 (04/13 0600) Resp:  [20-34] 23 (04/13 0600) BP: (135-194)/(35-83) 135/57 mmHg (04/13 0600) SpO2:  [96 %-100 %] 100 % (04/13 0600) Weight change:   Intake/Output from previous day:   Intake/Output this shift:    General appearance: cooperative and mild distress Resp: rales bibasilar Chest wall: RIJ cath Cardio: S1, S2 normal and systolic murmur: holosystolic 2/6, blowing at apex GI: soft, pos bs Extremities: L foot active gangrene. R cool and dark  Lab Results:  Recent Labs  12/10/15 0751 12/11/15 0304  WBC 11.7* 19.1*  HGB 7.9* 7.1*  HCT 24.3* 21.4*  PLT 245 250   BMET:  Recent Labs  12/10/15 0751 12/11/15 0304  NA 136 135  K 5.2* 3.9  CL 100* 99*  CO2 19* 19*  GLUCOSE 86 101*  BUN 42* 18  CREATININE 8.04* 4.35*  CALCIUM 7.8* 7.7*   No results for input(s): PTH in the last 72 hours. Iron Studies: No results for input(s): IRON, TIBC, TRANSFERRIN, FERRITIN in the last 72 hours.  Studies/Results: No results found.  I have reviewed the patient's current medications.  Assessment/Plan: 1 ESRd to do HD today, last planned tx 2 Ischemic PVD wants comfort care 3 PVD 4 Anemia 5 HPTH P HD, comfort care, ? Hospice    LOS: 3 days   Tanzie Rothschild L 12/12/2015,7:16 AM

## 2015-12-13 LAB — GLUCOSE, CAPILLARY
GLUCOSE-CAPILLARY: 135 mg/dL — AB (ref 65–99)
GLUCOSE-CAPILLARY: 181 mg/dL — AB (ref 65–99)
Glucose-Capillary: 115 mg/dL — ABNORMAL HIGH (ref 65–99)
Glucose-Capillary: 141 mg/dL — ABNORMAL HIGH (ref 65–99)
Glucose-Capillary: 157 mg/dL — ABNORMAL HIGH (ref 65–99)

## 2015-12-13 MED ORDER — FENTANYL CITRATE (PF) 100 MCG/2ML IJ SOLN
25.0000 ug | INTRAMUSCULAR | Status: DC | PRN
Start: 2015-12-13 — End: 2015-12-13

## 2015-12-13 MED ORDER — OXYCODONE HCL 5 MG/5ML PO SOLN: 5.0000 mg | ORAL | Status: DC | PRN

## 2015-12-13 MED ORDER — FENTANYL CITRATE (PF) 100 MCG/2ML IJ SOLN: 25.0000 ug | INTRAMUSCULAR | Status: AC | PRN

## 2015-12-13 MED ORDER — OXYCODONE HCL 5 MG/5ML PO SOLN: 5.0000 mg | ORAL | Status: AC | PRN

## 2015-12-13 MED ORDER — PREGABALIN 25 MG PO CAPS: 25.0000 mg | ORAL_CAPSULE | Freq: Every day | ORAL | Status: DC

## 2015-12-13 MED ORDER — ALPRAZOLAM 0.25 MG PO TABS: 0.2500 mg | ORAL_TABLET | Freq: Three times a day (TID) | ORAL | Status: AC | PRN

## 2015-12-13 MED ORDER — PREGABALIN 25 MG PO CAPS: 25.0000 mg | ORAL_CAPSULE | Freq: Every day | ORAL | Status: AC

## 2015-12-13 MED ORDER — ALPRAZOLAM 0.25 MG PO TABS: 0.2500 mg | ORAL_TABLET | Freq: Three times a day (TID) | ORAL | Status: DC | PRN

## 2015-12-13 NOTE — Progress Notes (Signed)
May use HD cath for access if needed to avoid sticks.  Will not follow formally at this time.

## 2015-12-13 NOTE — Progress Notes (Signed)
Daily Progress Note   Patient Name: Annette Roach       Date: 12/13/2015 DOB: June 11, 1928  Age: 82 y.o. MRN#: [id number] Attending Physician: Bartholomew Crews, MD Primary Care Physician: Gilford Rile, MD Admit Date: 12/09/2015  Reason for Consultation/Follow-up: Establishing goals of care and Pain control  Subjective: Annette Roach is more alert and completely oriented today. She has come to the realization that she is reaching end of life and is currently trying to process her death. She is very spiritual and calls out to God but also reassures herself that she has had a good life and that she is ready to go be with God. She reflects on her life and if she has been a good person "and made God proud." Her daughter helps to reassure her. After discussion Annette Roach does agree that the right decisions have been made to stop dialysis and utilize hospice. She does wish to speak (as well as her daughter) with nephrologist and they receive a call back from Dr. Justin Mend who will come speak with them. They like Dr. Justin Mend very much and appreciate and trust his opinions and recommendations. Emotional support provided. Annette Roach will benefit from chaplain support here in the hospital as well as with hospice.   Interval Events: 4/13 Last HD treatment  Length of Stay: 4 days  Current Medications: Scheduled Meds:  . acetaminophen  650 mg Oral TID  . calcitRIOL  2.25 mcg Oral Q T,Th,Sa-HD  . darbepoetin (ARANESP) injection - DIALYSIS  200 mcg Intravenous Q Tue-HD  . ferric gluconate (FERRLECIT/NULECIT) IV  125 mg Intravenous Q T,Th,Sa-HD  . heparin  5,000 Units Subcutaneous 3 times per day  . pregabalin  25 mg Oral Daily  . sodium chloride flush  3 mL Intravenous Q12H    Continuous Infusions:    PRN  Meds: ALPRAZolam, fentaNYL (SUBLIMAZE) injection, hydrALAZINE, oxyCODONE  Physical Exam: Physical Exam  Constitutional: She is oriented to person, place, and time. She appears well-developed and well-nourished.  HENT:  Head: Normocephalic and atraumatic.  Cardiovascular: Bradycardia present.   Pulmonary/Chest: Effort normal. No accessory muscle usage. No tachypnea. No respiratory distress.  Abdominal: Normal appearance.  Neurological: She is alert and oriented to person, place, and time.  Vital Signs: BP 145/66 mmHg  Pulse 92  Temp(Src) 97.4 F (36.3 C) (Oral)  Resp 18  Ht 5\' 1"  (1.549 m)  Wt 57 kg (125 lb 10.6 oz)  BMI 23.76 kg/m2  SpO2 98% SpO2: SpO2: 98 % O2 Device: O2 Device: Not Delivered O2 Flow Rate: O2 Flow Rate (L/min): 1 L/min  Intake/output summary:   Intake/Output Summary (Last 24 hours) at 12/13/15 1108 Last data filed at 12/12/15 1123  Gross per 24 hour  Intake      0 ml  Output   1214 ml  Net  -1214 ml   LBM:   Baseline Weight: Weight: 61 kg (134 lb 7.7 oz) Most recent weight: Weight: 57 kg (125 lb 10.6 oz)       Palliative Assessment/Data:   Additional Data Reviewed: CBC    Component Value Date/Time   WBC 19.1* 12/11/2015 0304   RBC 2.66* 12/11/2015 0304   RBC 3.44* 02/23/2015 1500   HGB 7.1* 12/11/2015 0304   HCT 21.4* 12/11/2015 0304   PLT 250 12/11/2015 0304   MCV 80.5 12/11/2015 0304   MCH 26.7 12/11/2015 0304   MCHC 33.2 12/11/2015 0304   RDW 21.0* 12/11/2015 0304   LYMPHSABS 1.0 03/23/2015 1027   MONOABS 0.4 03/23/2015 1027   EOSABS 0.1 03/23/2015 1027   BASOSABS 0.1 03/23/2015 1027    CMP     Component Value Date/Time   NA 135 12/11/2015 0304   K 3.9 12/11/2015 0304   CL 99* 12/11/2015 0304   CO2 19* 12/11/2015 0304   GLUCOSE 101* 12/11/2015 0304   BUN 18 12/11/2015 0304   CREATININE 4.35* 12/11/2015 0304   CALCIUM 7.7* 12/11/2015 0304   PROT 7.2 12/10/2015 0751   ALBUMIN 2.4* 12/10/2015 0751   AST 115*  12/10/2015 0751   ALT 31 12/10/2015 0751   ALKPHOS 56 12/10/2015 0751   BILITOT 1.0 12/10/2015 0751   GFRNONAA 8* 12/11/2015 0304   GFRAA 10* 12/11/2015 0304       Problem List:  Patient Active Problem List   Diagnosis Date Noted  . Palliative care encounter   . Ischemic rest pain of lower extremity ([place])   . Dry gangrene ([place]) 12/09/2015  . Pressure ulcer 12/09/2015  . History of pulmonary embolism 07/11/2015  . S/P tooth extraction 07/11/2015  . Constipation 07/11/2015  . Chronic combined systolic and diastolic CHF (congestive heart failure) ([place]) 07/11/2015  . Prolonged Q-T interval on ECG 07/11/2015  . Acute bronchitis   . ESRD on dialysis ([place])   . Pulmonary vascular congestion   . Elevated troponin 02/23/2015  . Acute pulmonary embolism ([place]) 02/23/2015  . Cough 02/23/2015  . Hypoxia   . Gastric cancer ([place])   . Pleural effusion, right   . Sepsis due to pneumonia ([place])   . Anemia in chronic kidney disease   . TIA (transient ischemic attack) 12/25/2014  . Sepsis ([place]) 12/25/2014  . Acute encephalopathy 12/25/2014  . Dialysis complication ([place])   . Right sided weakness   . Altered mental status   . Encephalopathy   . Bradycardia   . HCAP (healthcare-associated pneumonia) 11/13/2014  . Vasovagal syncope 11/13/2014  . Leukocytosis 11/13/2014  . SOB (shortness of breath) 10/27/2014  . Chest pain 10/27/2014  . Fever 10/27/2014  . CHF (congestive heart failure) ([place]) 10/27/2014  . Abdominal pain 10/27/2014  . Depression   . GERD (gastroesophageal reflux disease)   . Hypertension   . CAD (coronary artery disease)   . History of  kidney stones   . Asthma   . Steal syndrome of hand ([place]) 08/31/2012  . Swelling 07/18/2012  . End stage renal disease on dialysis [facility]) 12/10/2011     Palliative Care Assessment & Plan    1.Code Status:  DNR    Code Status Orders        Start     Ordered   12/09/15 1656  Do not attempt resuscitation (DNR)   Continuous     Question Answer Comment  In the event of cardiac or respiratory ARREST Do not call a "code blue"   In the event of cardiac or respiratory ARREST Do not perform Intubation, CPR, defibrillation or ACLS   In the event of cardiac or respiratory ARREST Use medication by any route, position, wound care, and other measures to relive pain and suffering. May use oxygen, suction and manual treatment of airway obstruction as needed for comfort.      12/09/15 1655    Code Status History    Date Active Date Inactive Code Status Order ID Comments User Context   07/11/2015  1:37 PM 07/12/2015  3:15 PM DNR [id number]  Willia Craze, NP Inpatient   03/23/2015  6:40 PM 03/25/2015  9:54 PM DNR [id number]  Corky Sox, MD Inpatient   03/23/2015  2:02 PM 03/23/2015  6:40 PM Full Code [id number]  Corky Sox, MD Inpatient   02/23/2015 12:32 PM 02/25/2015 11:07 PM DNR [id number]  Juluis Mire, MD ED   01/14/2015 10:48 PM 01/16/2015  9:38 PM DNR [id number]  Phillips Grout, MD Inpatient   12/25/2014  6:34 PM 12/29/2014  6:41 PM Full Code [id number]  Erline Hau, MD Inpatient   11/13/2014 11:09 PM 11/20/2014  9:32 PM Full Code [id number]  Etta Quill, DO ED   10/27/2014  5:36 AM 10/28/2014  6:02 PM Full Code [id number]  Ivor Costa, MD Inpatient    Advance Directive Documentation        Most Recent Value   Type of Advance Directive  Out of facility DNR (pink MOST or yellow form)   Pre-existing out of facility DNR order (yellow form or pink MOST form)     "MOST" Form in Place?         2. Goals of Care/Additional Recommendations:  Comfort care. Transition to hospice facility.   Limitations on Scope of Treatment: Full Comfort Care  Desire for further Chaplaincy support:yes  Psycho-social Needs: Caregiving  Support/Resources, Education on Hospice and Grief/Bereavement Support  3. Symptom Management:      1. Pain: Continue Lyrica 25 mg daily. Scheduled Tylenol. Fentanyl 25-50 mcg every hour prn. Also added  oxyfast 5-10 mg every 2 hours prn (IV was lost).   4. Palliative Prophylaxis:   Bowel Regimen, Delirium Protocol, Frequent Pain Assessment and Turn Reposition  5. Prognosis: < 2 weeks  6. Discharge Planning:  Hospice facility vs now considering home with hospice     Thank you for allowing the Palliative Medicine Team to assist in the care of this patient.   Time In: 1030 Time Out: 1100 Total Time 24min Prolonged Time Billed  no         Pershing Proud, NP  [id number], 11:08 AM  Please contact Palliative Medicine Team phone at [phone] for questions and concerns.

## 2015-12-13 NOTE — Clinical Social Work Note (Signed)
Patient was originally going to be going to residential hospice, but patient and her daughter decided they would like to go home with hospice instead.  Case discussed with case manager and plan is to discharge home with hospice.  CSW to sign off please re-consult if social work needs arise.  Jones Broom. [place], MSW, [place]

## 2015-12-13 NOTE — Care Management Important Message (Signed)
Important Message  Patient Details  Name: Annette Roach MRN: [id number] Date of Birth: 04/15/1928   Medicare Important Message Given:  Yes    [name] 12/13/2015, 11:13 AM

## 2015-12-13 NOTE — Discharge Summary (Addendum)
Name: Annette Roach MRN: [id number] DOB: Dec 10, 1927 82 y.o. PCP: Raina Mina, MD  Date of Admission: 12/09/2015  7:53 AM Date of Discharge: 12/13/2015 Attending Physician: Bartholomew Crews, MD  Discharge Diagnosis: Principal Problem: Dry Gangrene of the Left Foot Active Problems: Altered mental status ESRD on HD HTN Type 2 DM CAD s/p MI Systolic CHF GIST tumor   Discharge Medications:   Medication List    STOP taking these medications        aspirin EC 81 MG tablet     Azelastine HCl 0.15 % Soln     calcium acetate 667 MG capsule  Commonly known as:  PHOSLO     cinacalcet 30 MG tablet  Commonly known as:  SENSIPAR     clopidogrel 75 MG tablet  Commonly known as:  PLAVIX     hydrALAZINE 10 MG tablet  Commonly known as:  APRESOLINE     imatinib 100 MG tablet  Commonly known as:  GLEEVEC     metoprolol succinate 25 MG 24 hr tablet  Commonly known as:  TOPROL-XL     multivitamin Tabs tablet     nitroGLYCERIN 0.4 MG SL tablet  Commonly known as:  NITROSTAT      TAKE these medications        acetaminophen 325 MG tablet  Commonly known as:  TYLENOL  Take 2 tablets (650 mg total) by mouth every 6 (six) hours as needed for mild pain or headache.     ALPRAZolam 0.25 MG tablet  Commonly known as:  XANAX  Take 1 tablet (0.25 mg total) by mouth 3 (three) times daily as needed for anxiety.     fentaNYL 100 MCG/2ML injection  Commonly known as:  SUBLIMAZE  Inject 0.5-1 mLs (25-50 mcg total) into the vein every hour as needed for moderate pain.     mirtazapine 15 MG tablet  Commonly known as:  REMERON  Take 7.5 mg by mouth at bedtime.     ondansetron 4 MG tablet  Commonly known as:  ZOFRAN  Take 4 mg by mouth every 8 (eight) hours as needed for nausea or vomiting.     oxyCODONE 5 MG immediate release tablet  Commonly known as:  Oxy IR/ROXICODONE  Take 1 tablet (5 mg total) by mouth every 6 (six) hours as needed for severe pain.     pantoprazole  40 MG tablet  Commonly known as:  PROTONIX  Take 40 mg by mouth daily.     polyethylene glycol packet  Commonly known as:  MIRALAX / GLYCOLAX  Take 17 g by mouth daily.     pregabalin 25 MG capsule  Commonly known as:  LYRICA  Take 1 capsule (25 mg total) by mouth daily.     prochlorperazine 10 MG tablet  Commonly known as:  COMPAZINE  TAKE 1 TABLET EVERY 6 HOURS AS NEEDED FOR CHEMO NAUSEA-MAY TAKE AROUND THE CLOCK FOR PERSISTENT SYMP     sennosides-docusate sodium 8.6-50 MG tablet  Commonly known as:  SENOKOT-S  Take 1 tablet by mouth 2 (two) times daily as needed for constipation.     VENTOLIN HFA 108 (90 Base) MCG/ACT inhaler  Generic drug:  albuterol  Inhale 2 puffs into the lungs every 4 (four) hours as needed for wheezing or shortness of breath.        Disposition and follow-up:   Annette Roach was discharged from [facility] in Stable condition.    1.  Patient  being discharged to hospice.  2.  Labs / imaging needed at time of follow-up: None  3.  Pending labs/ test needing follow-up: None   Follow-up Appointments:   Discharge Instructions: Discharge Instructions    Diet - low sodium heart healthy    Complete by:  As directed      Diet - low sodium heart healthy    Complete by:  As directed      Increase activity slowly    Complete by:  As directed      Increase activity slowly    Complete by:  As directed            Consultations:    Procedures Performed:  Dg Chest 2 View  12/09/2015  CLINICAL DATA:  Altered mental status for 2 days.  Confusion EXAM: CHEST  2 VIEW COMPARISON:  07/11/2015 FINDINGS: Right dialysis catheter remains in place, unchanged. Cardiomegaly. There is a small right pleural effusion. Bibasilar airspace opacities are noted which could represent edema or infection. Vascular congestion. No acute bony abnormality. IMPRESSION: Cardiomegaly. Bilateral lower lobe airspace opacities with vascular congestion. Findings  could reflect edema or infection. Small right pleural effusion. Electronically Signed   By: Rolm Baptise M.D.   On: 12/09/2015 15:29   Ct Head Wo Contrast  12/09/2015  CLINICAL DATA:  Confusion.  Altered mental status. EXAM: CT HEAD WITHOUT CONTRAST TECHNIQUE: Contiguous axial images were obtained from the base of the skull through the vertex without intravenous contrast. COMPARISON:  MRI 12/25/2014 FINDINGS: There is atrophy and chronic small vessel disease changes. No acute intracranial abnormality. Specifically, no hemorrhage, hydrocephalus, mass lesion, acute infarction, or significant intracranial injury. No acute calvarial abnormality. Extensive mucosal thickening throughout the paranasal sinuses. Polypoid soft tissue within the right nasal cavity. Mastoid air cells are clear. IMPRESSION: No acute intracranial abnormality. Atrophy, chronic microvascular disease. Chronic sinusitis, probable nasal polyposis. Electronically Signed   By: Rolm Baptise M.D.   On: 12/09/2015 15:50   Dg Foot Complete Left  12/09/2015  CLINICAL DATA:  Infection, black toes second third and fourth toe EXAM: LEFT FOOT - COMPLETE 3+ VIEW COMPARISON:  11/15/2015 FINDINGS: Three views of the left foot submitted. No acute fracture or subluxation. Vascular calcifications are again noted. There is diffuse osteopenia. No acute fracture or subluxation. Again noted mild narrowing of interphalangeal joints. Stable plantar and posterior spurring of calcaneus. No bony erosion or periosteal reaction. No definite evidence of bone destruction. IMPRESSION: No acute fracture or subluxation. Stable mild degenerative changes. Diffuse osteopenia. Atherosclerotic vascular calcifications. No periosteal reaction or bony erosion. Again noted plantar and posterior spurring of calcaneus. Electronically Signed   By: Lahoma Crocker M.D.   On: 12/09/2015 09:17    Admission HPI: Annette Roach is an 82yo woman with PMHx of CAD s/p previous inferior MI, ESRD on HD TThS  schedule, HTN, type 2 DM, GIST tumor, peripheral vascular disease, and CHF (EF 45-50% per echo June 2016) who presents today with a 1 month history of ischemic changes to her left foot. History was obtained from the patient's daughter as the patient was altered. Daughter reports her foot has been worsening over the last week with her 1st and 4th toes becoming darker. She reports the foot is very cold to touch. She notes her mother has been wincing in pain due to the lack of blood flow to her foot. She notes her mother and her have been "bouncing around from hospital to hospital" to try to get help for her  foot. Daughter also notes the patient became very confused over the weekend and not acting herself. She reports the patient is normally talkative and interactive, but has been sleeping a lot more the past few days. She notes the patient received some pain medications from when they were at [facility] about 1 week ago, but the patient's last dose of Oxycodone IR 10 mg was on Friday. Per records, the patient was at [facility] on 12/02/15 for evaluation of her ischemic foot and left peroneal and proximal left posterior tibial artery angioplasties were performed without successful revascularization. In fact, she has had numerous attempts at revascularization with no success. Also per records, the patient has a history of confusion since Feb 2016 and there have been discussions of hospice but seems the patient/family has been hesitant.  Hospital Course by problem list:   Dry Gangrene of left leg Annette Roach presented with increasing pain in her left leg after failing multiple therapies for peripheral artery disease.  She was seen by vascular surgery who scheduled her for an above knee amputation. She was altered during the exam and consultation by vascular surgery.  Her daughter and HCPOA consented to the surgery on behalf of her mother.  After the patient received hemodialysis therapy on her second day of hospitalization,  she became more coherent, was informed about her pending surgery to remove her leg, and subsequently expressed her desire to not have her leg removed.  Further, her condition was deteriorating as evidenced by her decreasing hemoglobin, increasing blood pressure, and INR of 1.5, making her a poor surgical candidate.  The patient discussed her wishes with her 2 daughters (one was on the phone) and made the decision to forgo the surgery in favor of hospice and full comfort care.  Palliative care was consulted for recommendations on pain control.  She was started on fentanyl 25-50 mg IV q1h as needed, Lyrica 25 mg daily, and scheduled Tylenol for pain control.    End stage renal disease: Annette Roach had been receiving dialysis on a TThS schedule.  While in the hospital, nephology followed her and oversaw her dialysis.  Dialysis had to be cut short on the first day in the hospital due to loss of arterial access.  Nephology explained to Annette Roach daughter that dialysis was becoming harder to perform due to worsening of her vascular conditions.  Annette Roach daughter expressed that hemodialysis was becoming harder on her mother as well and her mother had repeatedly expressed her feelings that she was not wanting to continue dialysis for much longer.  Her daughter, on recommendation of the nephrologist, decided to have her mother undergo one final HD session, and then transition to hospice care.    End of Life Care: While in the hospital Annette Roach's mental status waxed and waned.  At times she was able to clearly express her wishes, and at other times she was confused and not oriented to her situation.  The primary team and the palliative care team held extensive discussions with the family including Annette Roach's two daughters, and the family made the decision to stop hemodialysis and transition their mother to comfort care. All prior home medications were discontinued, palliative care managed Annette Roach pain, and she  was discharged to [facility] on 12/13/2015.    Unspecified Left and Right Buttock Pressure Ulcer: Noted to have extensive deep tissue injuries to the right and left buttocks extending to the sacrum. Wound care evaluated her while she was in the hospital.  A foam dressing was applied to this area and she was recommended to change this dressing every 5 days or as needed.  Discharge Vitals:   BP 101/55 mmHg  Pulse 99  Temp(Src) 98 F (36.7 C) (Oral)  Resp 23  Ht 5\' 1"  (1.549 m)  Wt 125 lb 10.6 oz (57 kg)  BMI 23.76 kg/m2  SpO2 100% Physical Exam: General: Elderly woman resting in bed, pleasant, NAD HEENT: [place]/AT, PERRL, EOMI, moist mucus membranes CV: RRR no m/g/r Lungs: CTA bilaterally, breaths non-labored  Abd: BS+, soft, non-tender, non-distended Ext: Absent distal pulses. LLE with black appearing toes, dry gangrene.  Neuro: Alert and oriented to person and time  Discharge Labs:  Results for orders placed or performed during the hospital encounter of 12/09/15 (from the past 24 hour(s))  Glucose, capillary     Status: None   Collection Time: 12/12/15  3:45 PM  Result Value Ref Range   Glucose-Capillary 90 65 - 99 mg/dL  Glucose, capillary     Status: Abnormal   Collection Time: 12/12/15  8:13 PM  Result Value Ref Range   Glucose-Capillary 157 (H) 65 - 99 mg/dL  Glucose, capillary     Status: Abnormal   Collection Time: 12/13/15 12:36 AM  Result Value Ref Range   Glucose-Capillary 157 (H) 65 - 99 mg/dL  Glucose, capillary     Status: Abnormal   Collection Time: 12/13/15  4:40 AM  Result Value Ref Range   Glucose-Capillary 135 (H) 65 - 99 mg/dL  Glucose, capillary     Status: Abnormal   Collection Time: 12/13/15  8:05 AM  Result Value Ref Range   Glucose-Capillary 141 (H) 65 - 99 mg/dL  Glucose, capillary     Status: Abnormal   Collection Time: 12/13/15 11:41 AM  Result Value Ref Range   Glucose-Capillary 181 (H) 65 - 99 mg/dL    Signed: Juliet Rude,  MD 12/13/2015, 12:40 PM    Services Ordered on Discharge: None Equipment Ordered on Discharge: None

## 2015-12-13 NOTE — Progress Notes (Signed)
[facility] at bedside to transport patient home.  Patient alert and oriented to self and place. Disoriented to time and situation.  Patient in no distress.  VSS.  Patient given Oxycodone 5mg /80ml po prior to transport per her daughter's request.  Tunneled Dialysis catheter will be left in place.

## 2015-12-13 NOTE — Care Management Note (Addendum)
Case Management Note  Patient Details  Name: Annette Roach MRN: [id number] Date of Birth: 1928-04-10  Subjective/Objective:     Patient daughter has changed her mind and states patient would like to go home with hospice instead of residential hospice.  NCM  Made referral to [facility] who daughter chose from beginning with residential.  Patient has a hospital bed at home and will need to go by [facility] ambulance, will leave ambulance paper work on chart and RN will need to call [facility] when patient is ready for discharge.  NCM received call from [facility] and they state they have gotten her PCP to approve orders and we can discharge patient to home.  NCM informed Archivist to call [facility], paperwork on chart.                  Action/Plan:   Expected Discharge Date:                  Expected Discharge Plan:  [place]  In-House Referral:  Clinical Social Work  Discharge planning Services  CM Consult  Post Acute Care Choice:    Choice offered to:     DME Arranged:    DME Agency:     HH Arranged:    [place] Agency:     Status of Service:  Completed, signed off  Medicare Important Message Given:  Yes Date Medicare IM Given:    Medicare IM give by:    Date Additional Medicare IM Given:    Additional Medicare Important Message give by:     If discussed at [place] of Stay Meetings, dates discussed:    Additional Comments:  Zenon Mayo, RN 12/13/2015, 2:35 PM

## 2015-12-13 NOTE — Progress Notes (Signed)
Subjective: No acute events overnight. She is doing well this morning. Appears comfortable. Does not have any complaints.   Objective: Vital signs in last 24 hours: Filed Vitals:   12/12/15 1918 12/13/15 0035 12/13/15 0437 12/13/15 0815  BP: 165/82 166/64 134/60 145/66  Pulse: 43 54 84 92  Temp: 97.9 F (36.6 C) 97.6 F (36.4 C) 97.2 F (36.2 C) 97.4 F (36.3 C)  TempSrc: Oral Axillary Axillary Oral  Resp: 21 22 19 18   Height:      Weight:      SpO2: 100% 98% 99% 98%   Weight change:   Intake/Output Summary (Last 24 hours) at 12/13/15 0939 Last data filed at 12/12/15 1123  Gross per 24 hour  Intake    110 ml  Output   1214 ml  Net  -1104 ml   Physical Exam: General: Elderly woman resting in bed, pleasant, NAD HEENT: [place]/AT, PERRL, EOMI, moist mucus membranes CV: RRR no m/g/r Lungs: CTA bilaterally, breaths non-labored  Abd: BS+, soft, non-tender, non-distended Ext:  Absent distal pulses. LLE with black appearing toes, dry gangrene.  Neuro: Alert and oriented to person and time   Lab Results: Basic Metabolic Panel:  Recent Labs Lab 12/10/15 0751 12/11/15 0304  NA 136 135  K 5.2* 3.9  CL 100* 99*  CO2 19* 19*  GLUCOSE 86 101*  BUN 42* 18  CREATININE 8.04* 4.35*  CALCIUM 7.8* 7.7*   Liver Function Tests:  Recent Labs Lab 12/09/15 1853 12/10/15 0751  AST 107* 115*  ALT 27 31  ALKPHOS 61 56  BILITOT 0.9 1.0  PROT 7.5 7.2  ALBUMIN 2.4* 2.4*   CBC:  Recent Labs Lab 12/10/15 0751 12/11/15 0304  WBC 11.7* 19.1*  HGB 7.9* 7.1*  HCT 24.3* 21.4*  MCV 81.5 80.5  PLT 245 250   Cardiac Enzymes:  Recent Labs Lab 12/10/15 1128 12/10/15 1639 12/11/15 0304  TROPONINI 0.52* 0.60* 0.55*  CBG:  Recent Labs Lab 12/12/15 0331 12/12/15 1545 12/12/15 2013 12/13/15 0036 12/13/15 0440 12/13/15 0805  GLUCAP 103* 90 157* 157* 135* 141*   Hemoglobin A1C:  Recent Labs Lab 12/09/15 1853  HGBA1C 5.9*   Thyroid Function Tests:  Recent  Labs Lab 12/09/15 1853 12/10/15 0104  TSH 5.307*  --   FREET4  --  1.00    Micro Results: Recent Results (from the past 240 hour(s))  MRSA PCR Screening     Status: None   Collection Time: 12/09/15  7:08 PM  Result Value Ref Range Status   MRSA by PCR NEGATIVE NEGATIVE Final    Comment:        The GeneXpert MRSA Assay (FDA approved for NASAL specimens only), is one component of a comprehensive MRSA colonization surveillance program. It is not intended to diagnose MRSA infection nor to guide or monitor treatment for MRSA infections.     Medications: I have reviewed the patient's current medications. Scheduled Meds: . acetaminophen  650 mg Oral TID  . calcitRIOL  2.25 mcg Oral Q T,Th,Sa-HD  . darbepoetin (ARANESP) injection - DIALYSIS  200 mcg Intravenous Q Tue-HD  . ferric gluconate (FERRLECIT/NULECIT) IV  125 mg Intravenous Q T,Th,Sa-HD  . heparin  5,000 Units Subcutaneous 3 times per day  . pregabalin  25 mg Oral Daily  . sodium chloride flush  3 mL Intravenous Q12H   Continuous Infusions:   PRN Meds:.  Assessment/Plan: Ms. Pietras is a 82 y/o woman with PMHx of CAD s/p previous inferior MI, ESRD on  HD TThS schedule, HTN, type 2 DM, GIST tumor, peripheral vascular disease, and CHF (EF 45-50% per echo June 2016) presenting with dry gangrene of her left foot and altered mental status.  Dry Gangrene of the Left Foot: No significant changes. Patient to be discharged to Hospice when bed available.  - Hospice as above - Palliative care consulted, appreciate recommendations  - Tylenol, Fentanyl, Lyrica for pain  AMS: No significant changes. She is alert and oriented to person and time. - Soft diet - Hospice as above  ESRD on HD: On TTS schedule. Last HD 4/13.  - Per renal, catheter okay to use for med administration, etc at hospice.   HTN: BP stable in 0000000 systolic.  - Continue Hydralazine 2 mg Q4H PRN SBP > 180  Type 2 DM: Stable. Not eating much.  - CBG's q4h    CAD s/p MI: She is on ASA, Toprol-XL, and Plavix at home. Troponins mildly elevated likely due to demand ischemia. Her EKG does have TWI but these were present on prior EKGs. She does not seem to be having chest pain and reported only pain in her ankle when asked about pain.  - Hold home meds with AMS  Systolic CHF: She does not appear volume overloaded. - Hold home PO meds given AMS, Hospice  GIST tumor: No further management - Hold Gleevec  Diet: Soft VTE/PE PPx: Heparin [place] Dispo: Discharge to hospice when bed available, hopefully today  The patient does have a current PCP Raina Mina, MD) and does need an [facility] hospital follow-up appointment after discharge.  The patient does not have transportation limitations that hinder transportation to clinic appointments.  .Services Needed at time of discharge: Y = Yes, Blank = No PT:   OT:   RN:   Equipment:   Other:     LOS: 4 days   Juliet Rude, MD 12/13/2015, 9:39 AM

## 2015-12-30 DEATH — deceased

## 2016-02-21 ENCOUNTER — Encounter (HOSPITAL_COMMUNITY): Payer: Medicare Other

## 2016-02-21 ENCOUNTER — Encounter: Payer: Medicare Other | Admitting: Vascular Surgery

## 2016-03-27 IMAGING — CT CT CHEST W/O CM
2 of 4 series · 15 of 36 positions shown, 18 images · non-contrast
Comparison: Chest x-ray dated 12/25/2014 and CT scan dated
10/26/2014

CLINICAL DATA: Pleural effusion.  Increasing shortness of breath.

EXAM:
CT CHEST WITHOUT CONTRAST
TECHNIQUE: Multidetector CT imaging of the chest was performed following the
standard protocol without IV contrast.

[Series 2: thorax 5.0 i31f 1 · axial · 0.66mm/px · z∈[+1194,+1404]mm · 12 of 50 slices shown, 15 images]
[im 4/50  mediastinal]
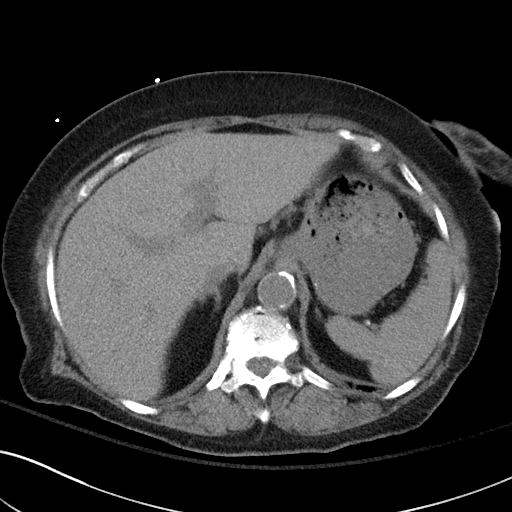
[im 4/50  lung]
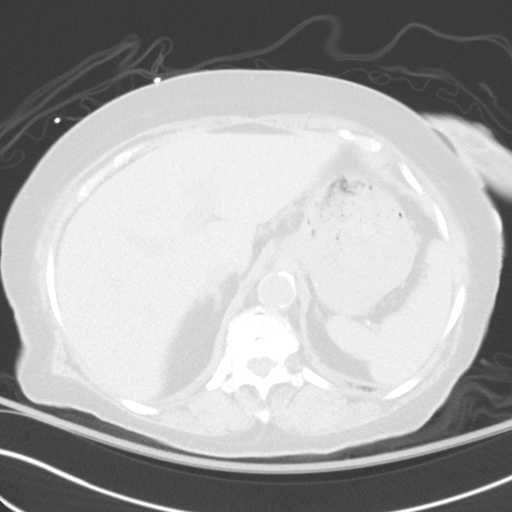
[im 8/50  lung]
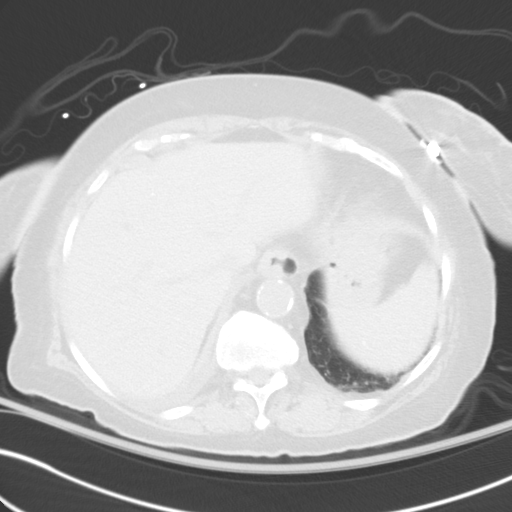
[im 12/50  lung]
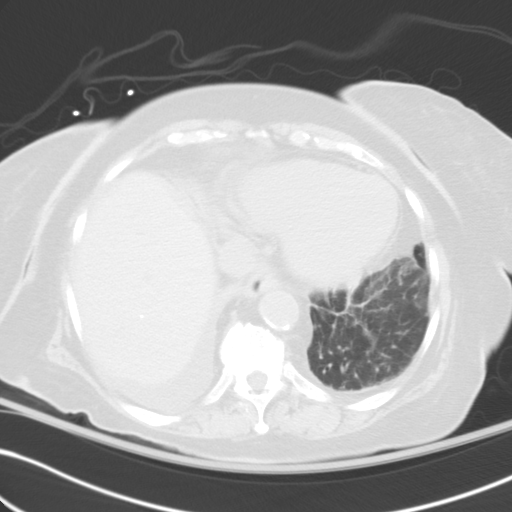
[im 16/50  lung]
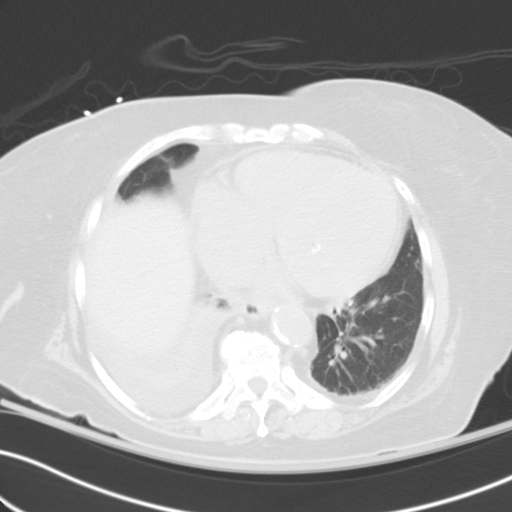
[im 19/50  mediastinal]
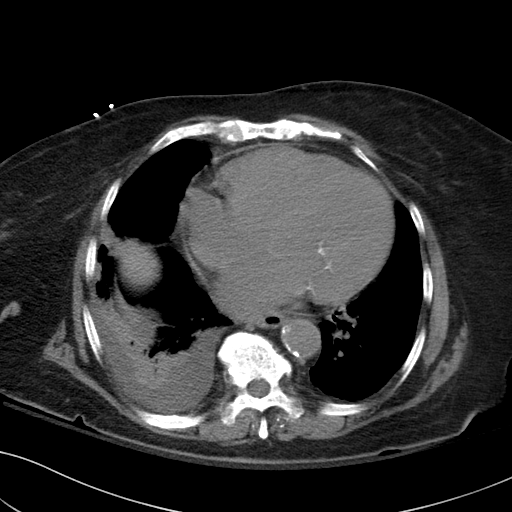
[im 19/50  lung]
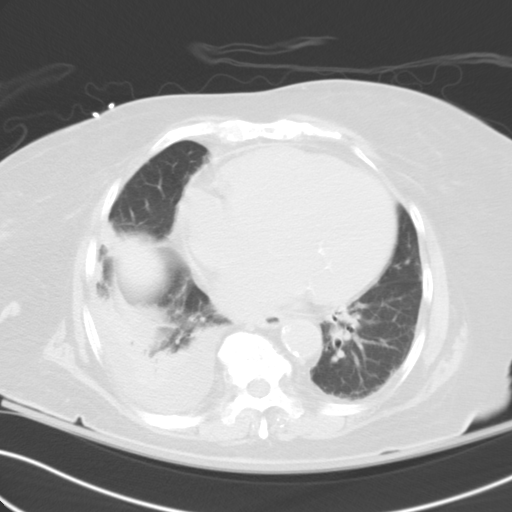
[im 23/50  lung]
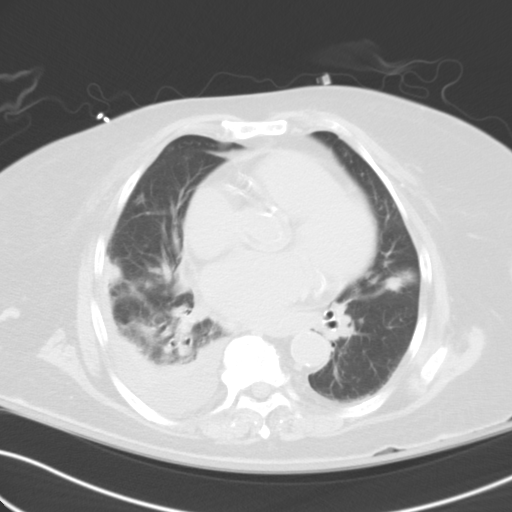
[im 27/50  lung]
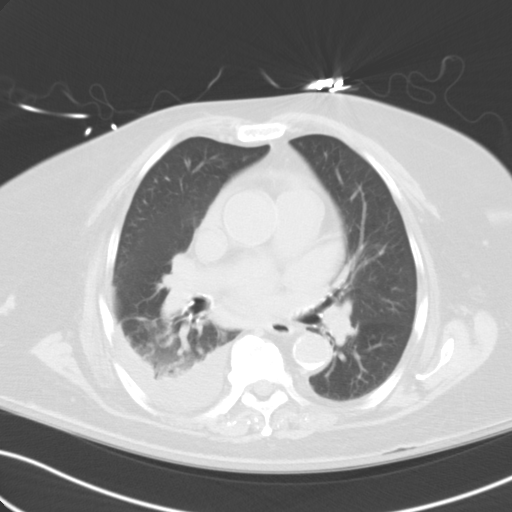
[im 31/50  lung]
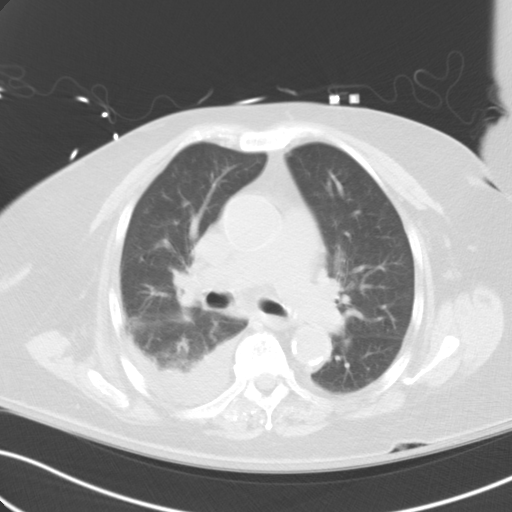
[im 34/50  mediastinal]
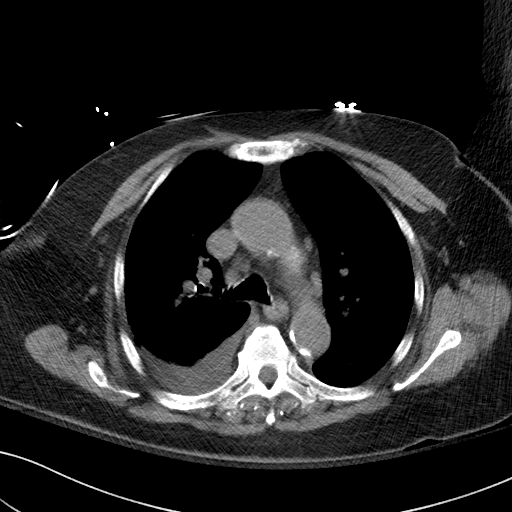
[im 34/50  lung]
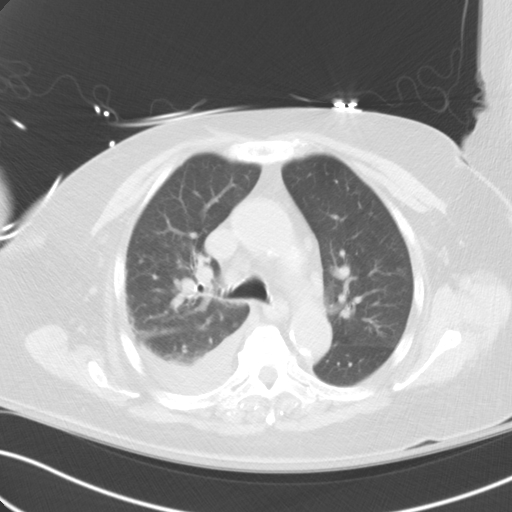
[im 38/50  lung]
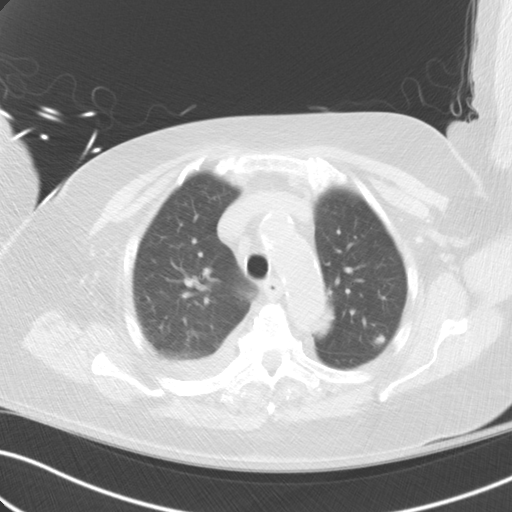
[im 42/50  lung]
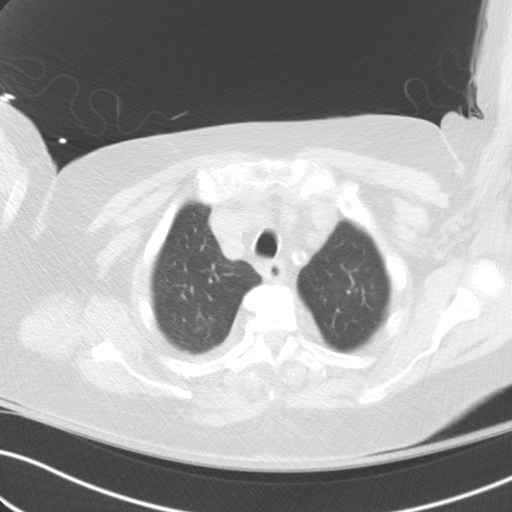
[im 46/50  lung]
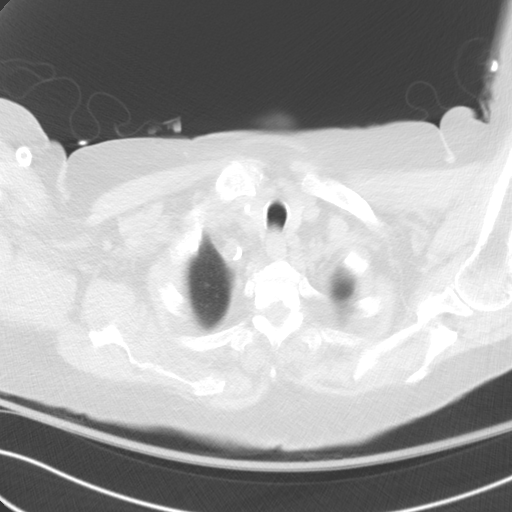

[Series 5: coronal · coronal · 0.50mm/px · 3 of 80 slices shown]
[im 16/80  lung]
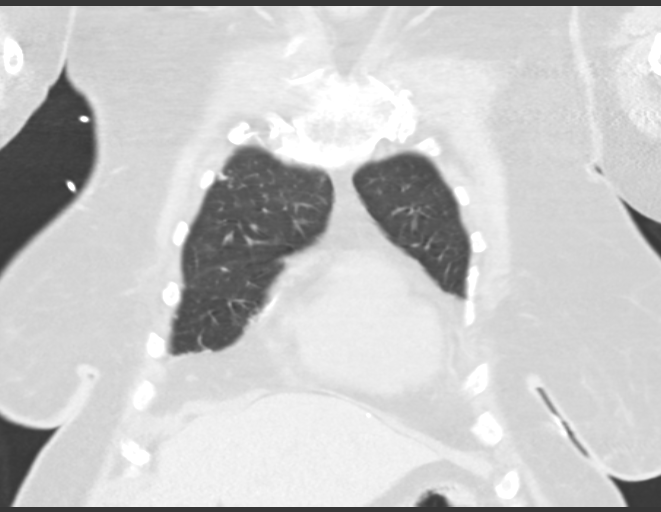
[im 32/80  lung]
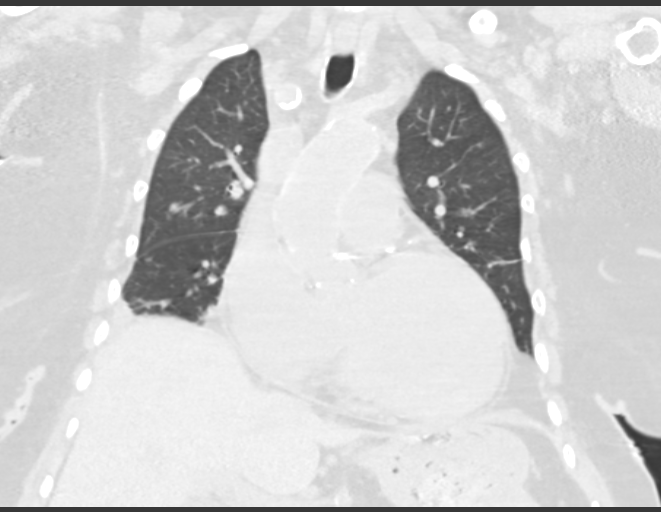
[im 48/80  lung]
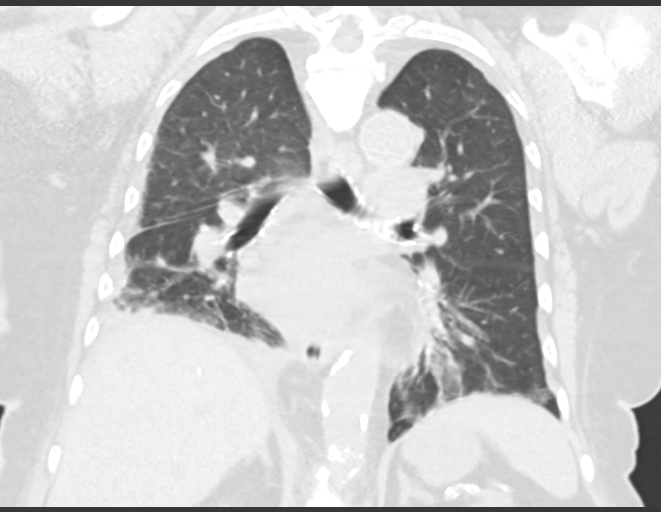

[15 of 36 positions shown; findings below may reference images not displayed]

FINDINGS: There is a new moderate right pleural effusion with compressive
atelectasis in the right lower lobe. Masses in both lower lobes
appear unchanged since the prior exam. Small left effusion has
resolved. There is a 5 mm nodule and a 4 mm nodule in the left lung
base on images 41 and 42 respectively. This area was obscured by the
effusion and atelectasis on the prior exam.

There is a vague area of nodularity in the right upper lobe on image
21 which is unchanged since the prior exam.

No hilar or mediastinal adenopathy. Chronic cardiomegaly,
essentially unchanged. No acute abnormality of the upper abdomen. No
acute osseous abnormality. The bones are diffusely in homogeneously
sclerotic. Anterior osteophytes fuse almost the entire thoracic
spine.
IMPRESSION: 1. New moderate right pleural effusion with secondary compressive
atelectasis in the right lower lobe.
2. Stable pulmonary nodules bilaterally since 10/26/2014.
[DATE]. Stable cardiomegaly with extensive calcification in the thoracic
aorta and coronary arteries.

## 2016-03-28 IMAGING — US US CHEST/MEDIASTINUM
1 series · 2 of 2 positions shown · non-contrast
Comparison: CT Chest 12/27/14.

CLINICAL DATA: Patient was scheduled for ultrasound guided right
thoracentesis.

EXAM:
LIMITED CHEST ULTRASOUND FOR PLEURAL EFFUSIONS
TECHNIQUE: Limited ultrasound survey for pleural effusion was performed
bilaterally.

[Series 1: us chest/mediastinum · 0.18mm/px · 2 of 2 slices shown]
[im 1/2]
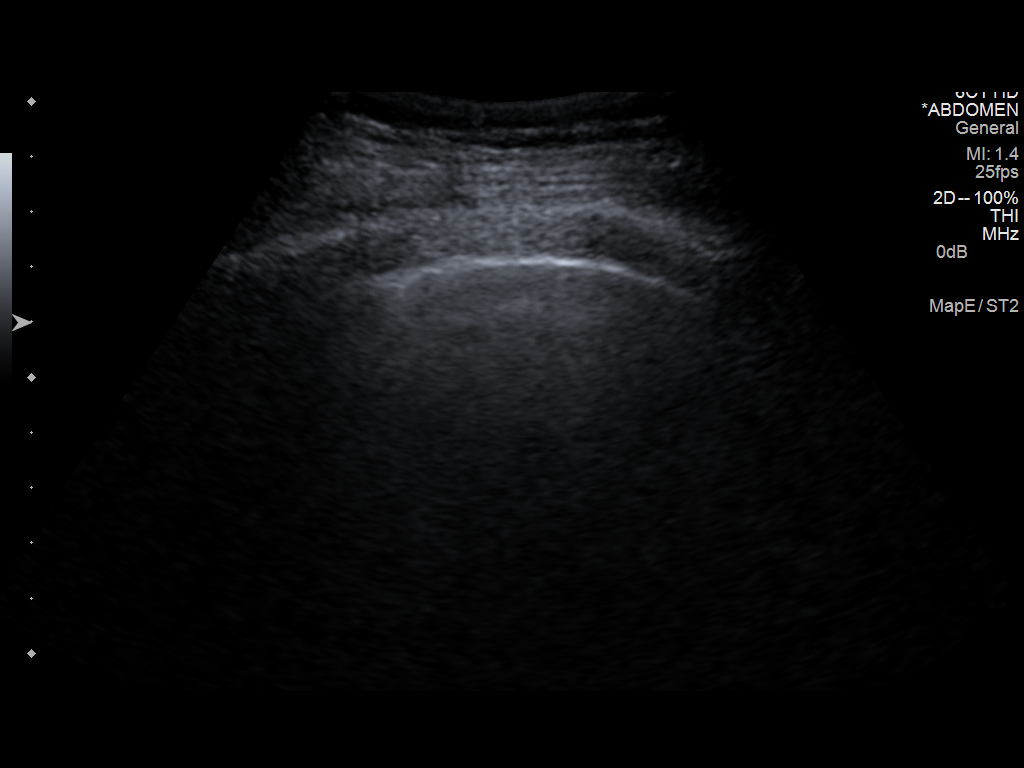
[im 2/2]
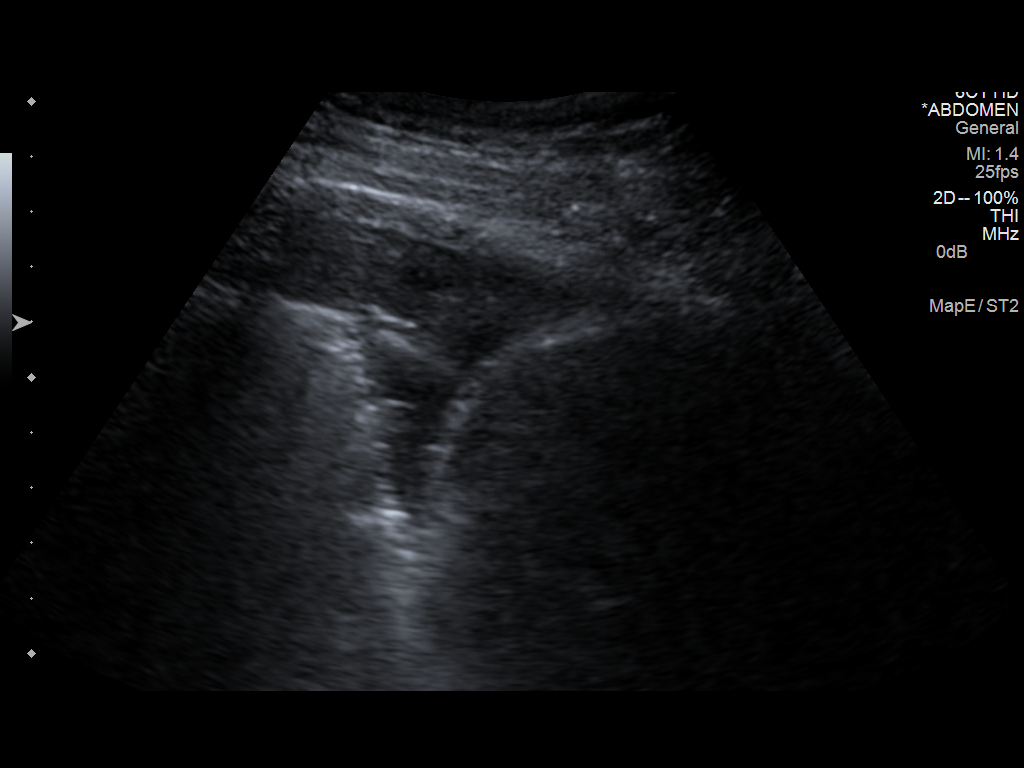

[2 of 2 positions shown; findings below may reference images not displayed]

FINDINGS: No pleural fluid is seen on the left and trace amount of right
pleural fluid seen that is not amendable to percutaneous drainage.
IMPRESSION: Trace amount of right pleural fluid seen that is not amendable to
percutaneous drainage.
